# Patient Record
Sex: Female | Born: 1970 | Race: White | Hispanic: No | Marital: Married | Smoking: Former smoker
Health system: Southern US, Community
[De-identification: ages and names within clinical notes are randomized; demographics above are authoritative.]

## PROBLEM LIST (undated history)

## (undated) DIAGNOSIS — R519 Headache, unspecified: Secondary | ICD-10-CM

## (undated) DIAGNOSIS — M459 Ankylosing spondylitis of unspecified sites in spine: Secondary | ICD-10-CM

## (undated) DIAGNOSIS — F419 Anxiety disorder, unspecified: Secondary | ICD-10-CM

## (undated) DIAGNOSIS — M1990 Unspecified osteoarthritis, unspecified site: Secondary | ICD-10-CM

## (undated) DIAGNOSIS — M069 Rheumatoid arthritis, unspecified: Secondary | ICD-10-CM

## (undated) DIAGNOSIS — F909 Attention-deficit hyperactivity disorder, unspecified type: Secondary | ICD-10-CM

## (undated) DIAGNOSIS — F32A Depression, unspecified: Secondary | ICD-10-CM

## (undated) DIAGNOSIS — F329 Major depressive disorder, single episode, unspecified: Secondary | ICD-10-CM

## (undated) DIAGNOSIS — Z8489 Family history of other specified conditions: Secondary | ICD-10-CM

## (undated) DIAGNOSIS — L309 Dermatitis, unspecified: Secondary | ICD-10-CM

## (undated) DIAGNOSIS — R51 Headache: Secondary | ICD-10-CM

## (undated) HISTORY — DX: Headache, unspecified: R51.9

## (undated) HISTORY — DX: Depression, unspecified: F32.A

## (undated) HISTORY — DX: Headache: R51

## (undated) HISTORY — DX: Major depressive disorder, single episode, unspecified: F32.9

## (undated) HISTORY — PX: TONSILLECTOMY: SUR1361

---
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
## 1999-06-26 ENCOUNTER — Other Ambulatory Visit: Admission: RE | Admit: 1999-06-26 | Discharge: 1999-06-26 | Payer: Self-pay | Admitting: Family Medicine

## 2000-08-08 ENCOUNTER — Encounter: Admission: RE | Admit: 2000-08-08 | Discharge: 2000-08-08 | Payer: Self-pay | Admitting: Family Medicine

## 2000-08-08 ENCOUNTER — Encounter: Payer: Self-pay | Admitting: Family Medicine

## 2008-03-05 ENCOUNTER — Encounter: Admission: RE | Admit: 2008-03-05 | Discharge: 2008-03-05 | Payer: Self-pay | Admitting: Family Medicine

## 2012-12-01 ENCOUNTER — Ambulatory Visit (INDEPENDENT_AMBULATORY_CARE_PROVIDER_SITE_OTHER): Payer: 59 | Admitting: Medical

## 2012-12-01 ENCOUNTER — Encounter: Payer: Self-pay | Admitting: Medical

## 2012-12-01 ENCOUNTER — Other Ambulatory Visit (HOSPITAL_COMMUNITY): Payer: Self-pay | Admitting: Nurse Practitioner

## 2012-12-01 ENCOUNTER — Ambulatory Visit
Admission: RE | Admit: 2012-12-01 | Discharge: 2012-12-01 | Disposition: A | Discharge status: Home / Self Care | Payer: 59 | Source: Ambulatory Visit | Attending: Medical | Admitting: Medical

## 2012-12-01 VITALS — BP 110/80 | HR 80 | Temp 98.2°F | Resp 14 | Ht 66.0 in | Wt 200.0 lb

## 2012-12-01 DIAGNOSIS — F17200 Nicotine dependence, unspecified, uncomplicated: Secondary | ICD-10-CM

## 2012-12-01 DIAGNOSIS — Z1231 Encounter for screening mammogram for malignant neoplasm of breast: Secondary | ICD-10-CM

## 2012-12-01 DIAGNOSIS — L309 Dermatitis, unspecified: Secondary | ICD-10-CM

## 2012-12-01 DIAGNOSIS — L259 Unspecified contact dermatitis, unspecified cause: Secondary | ICD-10-CM

## 2012-12-01 DIAGNOSIS — J309 Allergic rhinitis, unspecified: Secondary | ICD-10-CM

## 2012-12-01 DIAGNOSIS — R05 Cough: Secondary | ICD-10-CM

## 2012-12-01 DIAGNOSIS — J329 Chronic sinusitis, unspecified: Secondary | ICD-10-CM

## 2012-12-01 MED ORDER — TRIAMCINOLONE ACETONIDE 0.1 % EX OINT: TOPICAL_OINTMENT | Freq: Two times a day (BID) | CUTANEOUS | Status: DC

## 2012-12-01 MED ORDER — AMOXICILLIN 875 MG PO TABS: 875.0000 mg | ORAL_TABLET | Freq: Two times a day (BID) | ORAL | Status: DC

## 2012-12-01 MED ORDER — FLUCONAZOLE 150 MG PO TABS: 150.0000 mg | ORAL_TABLET | Freq: Once | ORAL | Status: DC

## 2012-12-01 NOTE — Progress Notes (Addendum)
Subjective: New patient today.  I see her daughter as a patient.   Beginning this Wednesday started feeling like the "dead."   She notes headache x several days worse than usual, body hurts all over.  Feels pressure in sinuses and ears, some sore throat, some cough.  But coughs in general, is a smoker.  Denies fever, chills.  No nausea, no vomiting.  No diarrhea.   Getting some nasal drainage, green.  She thinks she has had a sinus infection in the past, but usually doesn't get sick.  Denies sick contacts.  Using some Tylenol Cold for symptoms.  Prior to this wednesday, felt fine.    She notes chronic cough for 4+ months.   Attributes to smoking.  Has been 20+ pack year smoker .   In general does have allergy problems, takes daily OTC [employer] generic antihistamine.  She notes skin peeling for years on bilat palms, sometimes fingers, bad on left sole.  Gets only on side of right foot.  Skin can be painful with the rash busts open.  Normally is just flaky and dry.  Has used all kinds of OTC creams, neosporin.   Nivia cream OTC has worked best.  No other prescription medication.    Objective: Filed Vitals:   12/01/12 0828  BP: 110/80  Pulse: 80  Temp: 98.2 F (36.8 C)  Resp: 14    General appearance: alert, no distress, WD/WN, white female HEENT: normocephalic, sclerae anicteric, tender frontal sinuses, serous effusion of TMs bilat, nares with swollen turbinates, mucoid discharge and erythema, pharynx normal Oral cavity: MMM, no lesions Neck: supple, no lymphadenopathy, no thyromegaly, no masses, no JVD Lungs: CTA bilaterally, no wheezes, rhonchi, or rales Pulses normal Ext - no edema   Assessment: Encounter Diagnoses  Name Primary?  . Sinusitis Yes  . Allergic rhinitis   . Chronic cough   . Eczema   . Tobacco use disorder      Plan: Sinusitis - begin amoxicillin, discussed supportive care Allergic rhinitis - change to OTC Allegra or Zyrtec Chronic cough - given smoking  history, chronic cough, will send for CXR Eczema - discussed importance of hydrating skin, using daily moisturizing lotion, short term use of Triamcinolone ointment for flare up.   Recheck if not improving Tobacco use - advised cessation.  She failed chantix prior.  Not interested in quitting at this time. Diflucan for post antibiotic possible yeast infection, she notes this often after antibiotics in the past.

## 2012-12-01 NOTE — Patient Instructions (Signed)
Sinus infection - begin Amoxicillin antibiotic twice daily for 10 days.  Consider OTC Mucinex DM for 5 days.  Increase your water intake. Rest.  For allergies in general, try changing to Allegra or Zyrtec OTC daily.  Eczema rash - short term use Triamcinolone ointment for up to 1-2 weeks.   Once the rash seems to be improving significantly, begin Nivia again daily, but use the plastic wrap on the foot to hold moisture in over night.    Call or return if not improving in 1-2 weeks.   Eczema Atopic dermatitis, or eczema, is an inherited type of sensitive skin. Often people with eczema have a family history of allergies, asthma, or hay fever. It causes a red itchy rash and dry scaly skin. The itchiness may occur before the skin rash and may be very intense. It is not contagious. Eczema is generally worse during the cooler winter months and often improves with the warmth of summer. Eczema usually starts showing signs in infancy. Some children outgrow eczema, but it may last through adulthood. Flare-ups may be caused by:  Eating something or contact with something you are sensitive or allergic to.  Stress. DIAGNOSIS  The diagnosis of eczema is usually based upon symptoms and medical history. TREATMENT  Eczema cannot be cured, but symptoms usually can be controlled with treatment or avoidance of allergens (things to which you are sensitive or allergic to).  Controlling the itching and scratching.  Use over-the-counter antihistamines as directed for itching. It is especially useful at night when the itching tends to be worse.  Use over-the-counter steroid creams as directed for itching.  Scratching makes the rash and itching worse and may cause impetigo (a skin infection) if fingernails are contaminated (dirty).  Keeping the skin well moisturized with creams every day. This will seal in moisture and help prevent dryness. Lotions containing alcohol and water can dry the skin and are not  recommended.  Limiting exposure to allergens.  Recognizing situations that cause stress.  Developing a plan to manage stress. HOME CARE INSTRUCTIONS   Take prescription and over-the-counter medicines as directed by your caregiver.  Do not use anything on the skin without checking with your caregiver.  Keep baths or showers short (5 minutes) in warm (not hot) water. Use mild cleansers for bathing. You may add non-perfumed bath oil to the bath water. It is best to avoid soap and bubble bath.  Immediately after a bath or shower, when the skin is still damp, apply a moisturizing ointment to the entire body. This ointment should be a petroleum ointment. This will seal in moisture and help prevent dryness. The thicker the ointment the better. These should be unscented.  Keep fingernails cut short and wash hands often. If your child has eczema, it may be necessary to put soft gloves or mittens on your child at night.  Dress in clothes made of cotton or cotton blends. Dress lightly, as heat increases itching.  Avoid foods that may cause flare-ups. Common foods include cow's milk, peanut butter, eggs and wheat.  Keep a child with eczema away from anyone with fever blisters. The virus that causes fever blisters (herpes simplex) can cause a serious skin infection in children with eczema. SEEK MEDICAL CARE IF:   Itching interferes with sleep.  The rash gets worse or is not better within one week following treatment.  The rash looks infected (pus or soft yellow scabs).  You or your child has an oral temperature above 102 F (38.9  C).  Your baby is older than 3 months with a rectal temperature of 100.5 F (38.1 C) or higher for more than 1 day.  The rash flares up after contact with someone who has fever blisters. SEEK IMMEDIATE MEDICAL CARE IF:   Your baby is older than 3 months with a rectal temperature of 102 F (38.9 C) or higher.  Your baby is older than 3 months or younger with a  rectal temperature of 100.4 F (38 C) or higher. Document Released: 04/16/2000 Document Revised: 07/12/2011 Document Reviewed: 02/19/2009 [facility] Patient Information 2014 [place], [place].   Cough, Adult  A cough is a reflex that helps clear your throat and airways. It can help heal the body or may be a reaction to an irritated airway. A cough may only last 2 or 3 weeks (acute) or may last more than 8 weeks (chronic).  CAUSES Acute cough:  Viral or bacterial infections. Chronic cough:  Infections.  Allergies.  Asthma.  Post-nasal drip.  Smoking.  Heartburn or acid reflux.  Some medicines.  Chronic lung problems (COPD).  Cancer. SYMPTOMS   Cough.  Fever.  Chest pain.  Increased breathing rate.  High-pitched whistling sound when breathing (wheezing).  Colored mucus that you cough up (sputum). TREATMENT   A bacterial cough may be treated with antibiotic medicine.  A viral cough must run its course and will not respond to antibiotics.  Your caregiver may recommend other treatments if you have a chronic cough. HOME CARE INSTRUCTIONS   Only take over-the-counter or prescription medicines for pain, discomfort, or fever as directed by your caregiver. Use cough suppressants only as directed by your caregiver.  Use a cold steam vaporizer or humidifier in your bedroom or home to help loosen secretions.  Sleep in a semi-upright position if your cough is worse at night.  Rest as needed.  Stop smoking if you smoke. SEEK IMMEDIATE MEDICAL CARE IF:   You have pus in your sputum.  Your cough starts to worsen.  You cannot control your cough with suppressants and are losing sleep.  You begin coughing up blood.  You have difficulty breathing.  You develop pain which is getting worse or is uncontrolled with medicine.  You have a fever. MAKE SURE YOU:   Understand these instructions.  Will watch your condition.  Will get help right away if you are not  doing well or get worse. Document Released: 10/16/2010 Document Revised: 07/12/2011 Document Reviewed: 10/16/2010 [facility] Patient Information 2014 [place], [place].

## 2012-12-05 ENCOUNTER — Ambulatory Visit (HOSPITAL_COMMUNITY)
Admission: RE | Admit: 2012-12-05 | Discharge: 2012-12-05 | Disposition: A | Discharge status: Home / Self Care | Payer: 59 | Source: Ambulatory Visit | Attending: Nurse Practitioner | Admitting: Nurse Practitioner

## 2012-12-05 DIAGNOSIS — Z1231 Encounter for screening mammogram for malignant neoplasm of breast: Secondary | ICD-10-CM

## 2012-12-06 ENCOUNTER — Other Ambulatory Visit: Payer: Self-pay | Admitting: Nurse Practitioner

## 2012-12-06 DIAGNOSIS — R928 Other abnormal and inconclusive findings on diagnostic imaging of breast: Secondary | ICD-10-CM

## 2012-12-21 ENCOUNTER — Ambulatory Visit
Admission: RE | Admit: 2012-12-21 | Discharge: 2012-12-21 | Disposition: A | Discharge status: Home / Self Care | Payer: 59 | Source: Ambulatory Visit | Attending: Nurse Practitioner | Admitting: Nurse Practitioner

## 2012-12-21 DIAGNOSIS — R928 Other abnormal and inconclusive findings on diagnostic imaging of breast: Secondary | ICD-10-CM

## 2017-09-17 LAB — HM PAP SMEAR: HM Pap smear: NORMAL

## 2018-07-14 ENCOUNTER — Other Ambulatory Visit: Payer: Self-pay

## 2018-07-14 ENCOUNTER — Encounter: Payer: Self-pay | Admitting: Family Medicine

## 2018-07-14 ENCOUNTER — Ambulatory Visit (INDEPENDENT_AMBULATORY_CARE_PROVIDER_SITE_OTHER): Payer: BLUE CROSS/BLUE SHIELD | Admitting: Family Medicine

## 2018-07-14 VITALS — BP 126/80 | HR 71 | Temp 98.1°F | Resp 14 | Ht 65.5 in | Wt 268.5 lb

## 2018-07-14 DIAGNOSIS — Z8261 Family history of arthritis: Secondary | ICD-10-CM | POA: Diagnosis not present

## 2018-07-14 DIAGNOSIS — F419 Anxiety disorder, unspecified: Secondary | ICD-10-CM | POA: Insufficient documentation

## 2018-07-14 DIAGNOSIS — Z131 Encounter for screening for diabetes mellitus: Secondary | ICD-10-CM

## 2018-07-14 DIAGNOSIS — R234 Changes in skin texture: Secondary | ICD-10-CM | POA: Insufficient documentation

## 2018-07-14 DIAGNOSIS — F32A Depression, unspecified: Secondary | ICD-10-CM | POA: Insufficient documentation

## 2018-07-14 DIAGNOSIS — M2550 Pain in unspecified joint: Secondary | ICD-10-CM | POA: Insufficient documentation

## 2018-07-14 DIAGNOSIS — Z975 Presence of (intrauterine) contraceptive device: Secondary | ICD-10-CM | POA: Diagnosis not present

## 2018-07-14 DIAGNOSIS — K625 Hemorrhage of anus and rectum: Secondary | ICD-10-CM | POA: Insufficient documentation

## 2018-07-14 DIAGNOSIS — Z1231 Encounter for screening mammogram for malignant neoplasm of breast: Secondary | ICD-10-CM

## 2018-07-14 DIAGNOSIS — Z13220 Encounter for screening for lipoid disorders: Secondary | ICD-10-CM

## 2018-07-14 DIAGNOSIS — Z114 Encounter for screening for human immunodeficiency virus [HIV]: Secondary | ICD-10-CM

## 2018-07-14 DIAGNOSIS — F341 Dysthymic disorder: Secondary | ICD-10-CM

## 2018-07-14 DIAGNOSIS — Z1159 Encounter for screening for other viral diseases: Secondary | ICD-10-CM

## 2018-07-14 DIAGNOSIS — Z6841 Body Mass Index (BMI) 40.0 and over, adult: Secondary | ICD-10-CM | POA: Insufficient documentation

## 2018-07-14 MED ORDER — VENLAFAXINE HCL ER 37.5 MG PO CP24: ORAL_CAPSULE | ORAL | 0 refills | Status: DC

## 2018-07-14 NOTE — Progress Notes (Signed)
New Patient Office Visit  Subjective:  Patient ID: Anna Keller, female    DOB: 1970-05-15  Age: 48 y.o. MRN: [id number]  CC:  Chief Complaint  Patient presents with  . New Patient (Initial Visit)    HPI Anna Keller presents to establish care:  IUD In Place: Has had for 6 years, thinks it is a Corporate treasurer.  Is overdue to have it removed, we will refer to GYN for removal. She is still having some spotting (dark brown discharge) about once every 6 months. Has history of dysmenorrhea. No abdominal pain.  Concern for Arthritis: Has extensive family history (Father, PGM, Mother) of degenerative arthritis.  She notes that she has joint pain in almost all of her joints - knees, ankles, hands, elbows, hips, shoulders, neck, and her back.  She does note some skin changes for several years - skin peels from her feet, around her finger nails, and on her face, however no history of psoriasis.  She endorses joint stiffness bilaterally, no particular pattern to her pain or stiffness. She had LEFT knee arthroscopy at age 32, has not had any surgeries since then.  Stress incontinence: When she sneezes she has some urine leakage. Discussed Kegels. Declines additional evaluation at this time. No dysuria or hematuria.  Anxiety and Depression: She used to take Xanax for several years, and this worked well for her. 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 She used to get hot flashes but these have resolved, still having night sweats, she also has mood swings irritability.   Family history of breast cancer: MGM and Maternal Aunt.  Had mammogram 6 years ago. We will order today.  Obesity: She has been doing Keto, but not strictly.  She does not exercise because she is affected by chronic joint pain. Body mass index is 44 kg/m.   Intermittent Rectal Bleeding/?IBS-C: Denies family or personal history of colorectal cancer, no changes in BM's -  no blood in stool, dark and tarry stool, mucus in stool, or constipation/diarrhea.  She occasionally has blood on the toilet tissue when she wipes, she does remain constipated.     Office Visit from 07/14/2018 in [facility]  PHQ-9 Total Score  6    PHQ-9 is positive for dysthymia, but not MDD.  She has more anxiety component.   GAD 7 : Generalized Anxiety Score 07/14/2018  Nervous, Anxious, on Edge 2  Control/stop worrying 3  Worry too much - different things 3  Trouble relaxing 0  Restless 0  Easily annoyed or irritable 3  Afraid - awful might happen 0  Total GAD 7 Score 11  Anxiety Difficulty Not difficult at all   Past Medical History:  Diagnosis Date  . Depression   . Headache    No past surgical history on file.  Family History  Problem Relation Age of Onset  . Hypertension Mother   . Arthritis Father   . Heart disease Sister   . Pulmonary Hypertension Sister   . COPD Sister   . Diabetes Sister   . Breast cancer Maternal Grandfather   . Heart attack Paternal Grandmother   . Breast cancer Maternal Aunt     Social History   Socioeconomic History  . Marital status: Married    Spouse name: Anna Keller  . Number of children: 1  . Years of education: 62  . Highest education level: High school graduate  Occupational History  .  Not on file  Social Needs  . Financial resource strain: Not hard at all  . Food insecurity:    Worry: Never true    Inability: Never true  . Transportation needs:    Medical: No    Non-medical: No  Tobacco Use  . Smoking status: Former Smoker    Packs/day: 1.00    Years: 25.00    Pack years: 25.00    Types: Cigarettes    Last attempt to quit: 01/09/2016    Years since quitting: 2.5  . Smokeless tobacco: Never Used  Substance and Sexual Activity  . Alcohol use: Not Currently  . Drug use: No  . Sexual activity: Yes    Partners: Male    Birth control/protection: None  Lifestyle  . Physical activity:    Days per  week: 0 days    Minutes per session: 0 min  . Stress: Only a little  Relationships  . Social connections:    Talks on phone: More than three times a week    Gets together: More than three times a week    Attends religious service: Never    Active member of club or organization: No    Attends meetings of clubs or organizations: Never    Relationship status: Married  . Intimate partner violence:    Fear of current or ex partner: No    Emotionally abused: No    Physically abused: No    Forced sexual activity: No  Other Topics Concern  . Not on file  Social History Narrative  . Not on file    ROS Review of Systems  Constitutional: Negative for fever or weight change.  Respiratory: Negative for cough and shortness of breath.   Cardiovascular: Negative for chest pain or palpitations.  Gastrointestinal: Negative for abdominal pain, no bowel changes.  Musculoskeletal: Negative for gait problem or joint swelling. See HPI Skin: Negative for rash.  Neurological: Negative for dizziness or headache.  No other specific complaints in a complete review of systems (except as listed in HPI above).  Objective:   Today's Vitals: BP 126/80   Pulse 71   Temp 98.1 F (36.7 C)   Resp 14   Ht 5' 5.5" (1.664 m)   Wt 268 lb 8 oz (121.8 kg)   SpO2 98%   BMI 44.00 kg/m   Physical Exam  Constitutional: Patient appears well-developed and well-nourished. No distress.  HENT: Head: Normocephalic and atraumatic. Eyes: Conjunctivae and EOM are normal. No scleral icterus.  Neck: Normal range of motion. Neck supple. No JVD present. No thyromegaly present.  Cardiovascular: Normal rate, regular rhythm and normal heart sounds.  No murmur heard. No BLE edema. Pulmonary/Chest: Effort normal and breath sounds normal. No respiratory distress. Musculoskeletal: Normal range of motion, no joint effusions. No gross deformities. Crepitus to bilateral knees. Normal popliteal fossa.  Hands/fingers appear normal  with normal AROM and no swelling. Neurological: Pt is alert and oriented to person, place, and time. No cranial nerve deficit. Coordination, balance, strength, speech and gait are normal.  Skin: Skin is warm and dry. There is peeling dry skin around the nails on bilateral hands, the bilateral heels exhibit rough patches with peeling skin, there is a small area on the right cheek with a small patch of dry flaking skin. No erythema. Psychiatric: Patient has a normal mood and affect. behavior is normal. Judgment and thought content normal.  Assessment & Plan:   Problem List Items Addressed This Visit  Digestive   Bright red rectal bleeding   Relevant Orders   Ambulatory referral to Gastroenterology     Other   IUD (intrauterine device) in place   Relevant Orders   Ambulatory referral to Gynecology   Arthralgia - Primary   Relevant Orders   ANA   C-reactive protein (Completed)   Sedimentation rate (Completed)   CBC with Differential/Platelet (Completed)   Family history of arthritis   Peeling skin   Relevant Orders   Ambulatory referral to Dermatology   Class 3 severe obesity due to excess calories with body mass index (BMI) of 40.0 to 44.9 in adult (HCC)   Relevant Orders   TSH (Completed)   Anxiety   Relevant Medications   venlafaxine XR (EFFEXOR XR) 37.5 MG 24 hr capsule   Dysthymia   Relevant Medications   venlafaxine XR (EFFEXOR XR) 37.5 MG 24 hr capsule    Other Visit Diagnoses    Lipid screening       Relevant Orders   Lipid panel (Completed)   Diabetes mellitus screening       Relevant Orders   Comprehensive metabolic panel (Completed)   Hemoglobin A1c (Completed)   Need for hepatitis C screening test       Relevant Orders   Hepatitis C antibody   Encounter for screening for HIV       Relevant Orders   HIV Antibody (routine testing w rflx) (Completed)   Breast cancer screening by mammogram       Relevant Orders   MM 3D SCREEN BREAST BILATERAL       Outpatient Encounter Medications as of 07/14/2018  Medication Sig  . Cetirizine HCl (ZYRTEC PO) Take 1 tablet by mouth daily.  . Cholecalciferol (VITAMIN D3 PO) Take 1 capsule by mouth daily.  . Multiple Vitamins-Minerals (MULTIVITAMIN WITH MINERALS) tablet Take 1 tablet by mouth daily.  Marland Kitchen OMEPRAZOLE PO Take 1 tablet by mouth daily.  . Cyanocobalamin (B-12) 1000 MCG CAPS Take 1 capsule by mouth daily.  Marland Kitchen docusate sodium (COLACE) 100 MG capsule Take 100 mg by mouth 2 (two) times daily.  . fish oil-omega-3 fatty acids 1000 MG capsule Take 1 g by mouth daily.  . naproxen sodium (ANAPROX) 220 MG tablet Take 440 mg by mouth 2 (two) times daily with a meal.  . NON FORMULARY Take 3 tablets by mouth daily.  . NON FORMULARY Take 2 capsules by mouth daily.  Marland Kitchen venlafaxine XR (EFFEXOR XR) 37.5 MG 24 hr capsule Take 1 tablet daily for 7 days, the increase to 2 tablets daily.  . [DISCONTINUED] amoxicillin (AMOXIL) 875 MG tablet Take 1 tablet (875 mg total) by mouth 2 (two) times daily. (Patient not taking: Reported on 07/14/2018)  . [DISCONTINUED] fluconazole (DIFLUCAN) 150 MG tablet Take 1 tablet (150 mg total) by mouth once. (Patient not taking: Reported on 07/14/2018)  . [DISCONTINUED] triamcinolone ointment (KENALOG) 0.1 % Apply topically 2 (two) times daily. (Patient not taking: Reported on 07/14/2018)   No facility-administered encounter medications on file as of 07/14/2018.     Follow-up: Return for 6 week follow up + CPE w/ Pap - 105min appt.   Hubbard Hartshorn, FNP

## 2018-07-14 NOTE — Patient Instructions (Signed)
[facility] - Walk in clinic 1pm-7pm Mon-Fri

## 2018-07-17 LAB — CBC WITH DIFFERENTIAL/PLATELET
Absolute Monocytes: 436 {cells}/uL (ref 200–950)
Basophils Absolute: 59 {cells}/uL (ref 0–200)
Basophils Relative: 1.2 %
Eosinophils Absolute: 191 {cells}/uL (ref 15–500)
Eosinophils Relative: 3.9 %
HCT: 38 % (ref 35.0–45.0)
Hemoglobin: 13.2 g/dL (ref 11.7–15.5)
Lymphs Abs: 1764 {cells}/uL (ref 850–3900)
MCH: 32.6 pg (ref 27.0–33.0)
MCHC: 34.7 g/dL (ref 32.0–36.0)
MCV: 93.8 fL (ref 80.0–100.0)
MPV: 9.8 fL (ref 7.5–12.5)
Monocytes Relative: 8.9 %
Neutro Abs: 2450 {cells}/uL (ref 1500–7800)
Neutrophils Relative %: 50 %
Platelets: 273 Thousand/uL (ref 140–400)
RBC: 4.05 Million/uL (ref 3.80–5.10)
RDW: 12.4 % (ref 11.0–15.0)
Total Lymphocyte: 36 %
WBC: 4.9 Thousand/uL (ref 3.8–10.8)

## 2018-07-17 LAB — COMPREHENSIVE METABOLIC PANEL WITH GFR
AG Ratio: 2 (calc) (ref 1.0–2.5)
ALT: 15 U/L (ref 6–29)
AST: 13 U/L (ref 10–35)
Albumin: 4.3 g/dL (ref 3.6–5.1)
Alkaline phosphatase (APISO): 73 U/L (ref 31–125)
BUN: 17 mg/dL (ref 7–25)
CO2: 24 mmol/L (ref 20–32)
Calcium: 9.2 mg/dL (ref 8.6–10.2)
Chloride: 106 mmol/L (ref 98–110)
Creat: 0.64 mg/dL (ref 0.50–1.10)
Globulin: 2.2 g/dL (ref 1.9–3.7)
Glucose, Bld: 106 mg/dL — ABNORMAL HIGH (ref 65–99)
Potassium: 4 mmol/L (ref 3.5–5.3)
Sodium: 138 mmol/L (ref 135–146)
Total Bilirubin: 0.4 mg/dL (ref 0.2–1.2)
Total Protein: 6.5 g/dL (ref 6.1–8.1)

## 2018-07-17 LAB — HEMOGLOBIN A1C
Hgb A1c MFr Bld: 5 % of total Hgb (ref <5.7)
Mean Plasma Glucose: 97 (calc)
eAG (mmol/L): 5.4 (calc)

## 2018-07-17 LAB — LIPID PANEL
CHOL/HDL RATIO: 3.6 (calc) (ref <5.0)
Cholesterol: 199 mg/dL (ref <200)
HDL: 55 mg/dL (ref >=50)
LDL CHOLESTEROL (CALC): 119 mg/dL — AB
Non-HDL Cholesterol (Calc): 144 mg/dL (calc) — ABNORMAL HIGH (ref <130)
Triglycerides: 141 mg/dL (ref <150)

## 2018-07-17 LAB — ANA: Anti Nuclear Antibody(ANA): NEGATIVE

## 2018-07-17 LAB — HEPATITIS C ANTIBODY
Hepatitis C Ab: NONREACTIVE
SIGNAL TO CUT-OFF: 0 (ref <1.00)

## 2018-07-17 LAB — C-REACTIVE PROTEIN: CRP: 4.8 mg/L

## 2018-07-17 LAB — SEDIMENTATION RATE: Sed Rate: 2 mm/h (ref 0–20)

## 2018-07-17 LAB — TSH: TSH: 1.59 mIU/L

## 2018-07-17 LAB — HIV ANTIBODY (ROUTINE TESTING W REFLEX): HIV 1&2 Ab, 4th Generation: NONREACTIVE

## 2018-07-19 ENCOUNTER — Telehealth: Payer: Self-pay | Admitting: Obstetrics & Gynecology

## 2018-07-19 NOTE — Telephone Encounter (Signed)
[facility] referring for IUD Removal. Called and left voicemail for patient to call back to be schedule

## 2018-07-28 NOTE — Telephone Encounter (Signed)
Called and left voice mail for patient to call back to be schedule °

## 2018-08-29 ENCOUNTER — Other Ambulatory Visit: Payer: Self-pay

## 2018-08-29 ENCOUNTER — Ambulatory Visit (INDEPENDENT_AMBULATORY_CARE_PROVIDER_SITE_OTHER): Payer: BLUE CROSS/BLUE SHIELD | Admitting: Family Medicine

## 2018-08-29 ENCOUNTER — Encounter: Payer: Self-pay | Admitting: Family Medicine

## 2018-08-29 DIAGNOSIS — K625 Hemorrhage of anus and rectum: Secondary | ICD-10-CM | POA: Diagnosis not present

## 2018-08-29 DIAGNOSIS — M2550 Pain in unspecified joint: Secondary | ICD-10-CM | POA: Diagnosis not present

## 2018-08-29 DIAGNOSIS — F419 Anxiety disorder, unspecified: Secondary | ICD-10-CM

## 2018-08-29 DIAGNOSIS — Z6841 Body Mass Index (BMI) 40.0 and over, adult: Secondary | ICD-10-CM

## 2018-08-29 MED ORDER — BUSPIRONE HCL 7.5 MG PO TABS: ORAL_TABLET | ORAL | 1 refills | Status: DC

## 2018-08-29 MED ORDER — ESCITALOPRAM OXALATE 10 MG PO TABS: 10.0000 mg | ORAL_TABLET | Freq: Every day | ORAL | 1 refills | Status: DC

## 2018-08-29 NOTE — Patient Instructions (Addendum)
Take Venlafaxine (Effexor) 2 tablets once daily AND Take 1/2 tablet of Lexapro once daily for 7 days. THEN Take 1 tablet of Effexor once daily AND take 1 tablet of Lexapro once daily for 7 days. THEN STOP Effexor, continue taking Lexapro once daily.  Call [facility] - [phone] to find out if they can see you over the phone.

## 2018-08-29 NOTE — Progress Notes (Signed)
Name: Anna Keller   MRN: [id number]    DOB: 1970/05/18   Date:08/29/2018       Progress Note  Subjective  Chief Complaint  Chief Complaint  Patient presents with  . Follow-up    6 week recheck    I connected with  Anna Keller  on 08/29/18 at  8:20 AM EDT by a video enabled telemedicine application and verified that I am speaking with the correct person using two identifiers.  I discussed the limitations of evaluation and management by telemedicine and the availability of in person appointments. The patient expressed understanding and agreed to proceed. Staff also discussed with the patient that there may be a patient responsible charge related to this service. Patient Location: home Provider Location: home Additional Individuals present: none  HPI  Arthralgia: Has extensive family history (Father, PGM, Mother) of degenerative arthritis.  She notes that she has joint pain in almost all of her joints - knees, ankles, hands, elbows, hips, shoulders, neck, and her back.  She does note some skin changes for several years - skin peels from her feet, around her finger nails, and on her face, however no history of psoriasis.  She endorses joint stiffness bilaterally, no particular pattern to her pain or stiffness. She had LEFT knee arthroscopy at age 61, has not had any surgeries since then.  Lab testing at initial visit was reassuring for no inflammatory arthritis - Sed rate, CRP, TSH, CBC were all normal.  We will refer to Ortho. She is taking tylenol OTC BID which only helps somewhat. After cleared by GI for rectal bleeding, will consider NSAID therapy.   Anxiety: She used to take Xanax for several years, and this worked well for her. 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 She used to get hot flashes but these have resolved, still having night sweats, she also has mood swings irritability. She was initiated on Effexor  and is at 75mg  and this is only helping a little bit. We will change medication regimen today per orders.     Office Visit from 08/29/2018 in [facility]  PHQ-9 Total Score  1     GAD 7 : Generalized Anxiety Score 08/29/2018 07/14/2018  Nervous, Anxious, on Edge 3 2  Control/stop worrying 3 3  Worry too much - different things 3 3  Trouble relaxing 0 0  Restless 0 0  Easily annoyed or irritable 3 3  Afraid - awful might happen 0 0  Total GAD 7 Score 12 11  Anxiety Difficulty Not difficult at all Not difficult at all   Obesity: She has been doing Keto, but not strictly.  She does not exercise because she is affected by chronic joint pain. Recommend gentle exercise including aquatics once able to attend.  Intermittent Rectal Bleeding/?IBS-C: Denies family or personal history of colorectal cancer, no changes in BM's - no blood in stool, dark and tarry stool, mucus in stool, or constipation/diarrhea.  She occasionally has blood on the toilet tissue when she wipes, she does remain constipated.  She has not been to see GI yet.   Patient Active Problem List   Diagnosis Date Noted  . IUD (intrauterine device) in place 07/14/2018  . Arthralgia 07/14/2018  . Family history of arthritis 07/14/2018  . Peeling skin 07/14/2018  . Class 3 severe obesity due to excess calories with body mass index (BMI) of 40.0 to 44.9 in  adult ([place]) 07/14/2018  . Bright red rectal bleeding 07/14/2018  . Anxiety 07/14/2018  . Dysthymia 07/14/2018    No past surgical history on file.  Family History  Problem Relation Age of Onset  . Hypertension Mother   . Arthritis Father   . Heart disease Sister   . Pulmonary Hypertension Sister   . COPD Sister   . Diabetes Sister   . Breast cancer Maternal Grandfather   . Heart attack Paternal Grandmother   . Breast cancer Maternal Aunt     Social History   Socioeconomic History  . Marital status: Married    Spouse name: Anna Keller  . Number of  children: 1  . Years of education: 72  . Highest education level: High school graduate  Occupational History  . Not on file  Social Needs  . Financial resource strain: Not hard at all  . Food insecurity:    Worry: Never true    Inability: Never true  . Transportation needs:    Medical: No    Non-medical: No  Tobacco Use  . Smoking status: Former Smoker    Packs/day: 1.00    Years: 25.00    Pack years: 25.00    Types: Cigarettes    Last attempt to quit: 01/09/2016    Years since quitting: 2.6  . Smokeless tobacco: Never Used  Substance and Sexual Activity  . Alcohol use: Not Currently  . Drug use: No  . Sexual activity: Yes    Partners: Male    Birth control/protection: None  Lifestyle  . Physical activity:    Days per week: 0 days    Minutes per session: 0 min  . Stress: Only a little  Relationships  . Social connections:    Talks on phone: More than three times a week    Gets together: More than three times a week    Attends religious service: Never    Active member of club or organization: No    Attends meetings of clubs or organizations: Never    Relationship status: Married  . Intimate partner violence:    Fear of current or ex partner: No    Emotionally abused: No    Physically abused: No    Forced sexual activity: No  Other Topics Concern  . Not on file  Social History Narrative  . Not on file     Current Outpatient Medications:  .  Cetirizine HCl (ZYRTEC PO), Take 1 tablet by mouth daily., Disp: , Rfl:  .  Cholecalciferol (VITAMIN D3 PO), Take 1 capsule by mouth daily., Disp: , Rfl:  .  Cyanocobalamin (B-12) 1000 MCG CAPS, Take 1 capsule by mouth daily., Disp: , Rfl:  .  docusate sodium (COLACE) 100 MG capsule, Take 100 mg by mouth 2 (two) times daily., Disp: , Rfl:  .  fish oil-omega-3 fatty acids 1000 MG capsule, Take 1 g by mouth daily., Disp: , Rfl:  .  Multiple Vitamins-Minerals (MULTIVITAMIN WITH MINERALS) tablet, Take 1 tablet by mouth daily.,  Disp: , Rfl:  .  naproxen sodium (ANAPROX) 220 MG tablet, Take 440 mg by mouth 2 (two) times daily with a meal., Disp: , Rfl:  .  NON FORMULARY, Take 3 tablets by mouth daily., Disp: , Rfl:  .  NON FORMULARY, Take 2 capsules by mouth daily., Disp: , Rfl:  .  OMEPRAZOLE PO, Take 1 tablet by mouth daily., Disp: , Rfl:   No Known Allergies  I personally reviewed active problem list, medication list,  allergies, health maintenance, notes from last encounter, lab results with the patient/caregiver today.   ROS Constitutional: Negative for fever or weight change.  Respiratory: Negative for cough and shortness of breath.   Cardiovascular: Negative for chest pain or palpitations.  Gastrointestinal: Negative for abdominal pain, no bowel changes.  Musculoskeletal: Negative for gait problem or joint swelling. +Arthralgias Skin: Negative for rash.  Neurological: Negative for dizziness or headache.  No other specific complaints in a complete review of systems (except as listed in HPI above).  Objective  Virtual encounter, vitals not obtained.  There is no height or weight on file to calculate BMI.  Physical Exam  Constitutional: Patient appears well-developed and well-nourished. No distress.  HENT: Head: Normocephalic and atraumatic.  Neck: Normal range of motion. Pulmonary/Chest: Effort normal. No respiratory distress. Speaking in complete sentences Neurological: Pt is alert and oriented to person, place, and time. Coordination, speech are normal.  Psychiatric: Patient has a normal mood and affect. behavior is normal. Judgment and thought content normal.  No results found for this or any previous visit (from the past 72 hour(s)).  PHQ2/9: Depression screen [facility] 2/9 08/29/2018 07/14/2018  Decreased Interest 0 0  Down, Depressed, Hopeless 0 0  PHQ - 2 Score 0 0  Altered sleeping 0 3  Tired, decreased energy 1 3  Change in appetite 0 0  Feeling bad or failure about yourself  0 0  Trouble  concentrating 0 0  Moving slowly or fidgety/restless 0 0  Suicidal thoughts 0 0  PHQ-9 Score 1 6  Difficult doing work/chores Not difficult at all Not difficult at all   PHQ-2/9 Result is negative.    Fall Risk: Fall Risk  08/29/2018 07/14/2018  Falls in the past year? 0 0  Number falls in past yr: 0 -  Injury with Fall? 0 -  Follow up Falls evaluation completed -    Assessment & Plan  1. Anxiety - See AVS for instructions on medicaiton tapering.  She Effexor is ineffective. - escitalopram (LEXAPRO) 10 MG tablet; Take 1 tablet (10 mg total) by mouth daily.  Dispense: 30 tablet; Refill: 1 - busPIRone (BUSPAR) 7.5 MG tablet; Take 1 tablet once daily at night; may take 1 additional tablet daily as needed.  Dispense: 45 tablet; Refill: 1  2. Arthralgia, unspecified joint - Advised needs to have evaluation for intermittent rectal bleeding prior to determining if NSAID therapy may be utilized.  We may consider pain management referral at next visit as well.  3. Class 3 severe obesity due to excess calories with body mass index (BMI) of 40.0 to 44.9 in adult, unspecified whether serious comorbidity present [facility]) - Discussed importance of 150 minutes of physical activity weekly (recommend she do as tolerated), eat two servings of fish weekly, eat one serving of tree nuts ( cashews, pistachios, pecans, almonds.Marland Kitchen) every other day, eat 6 servings of fruit/vegetables daily and drink plenty of water and avoid sweet beverages.   4. Bright red rectal bleeding - Given number for [facility] - advised likely doing telehealth and to not postpone her appointment.  I discussed the assessment and treatment plan with the patient. The patient was provided an opportunity to ask questions and all were answered. The patient agreed with the plan and demonstrated an understanding of the instructions.  The patient was advised to call back or seek an in-person evaluation if the symptoms worsen or if the condition fails  to improve as anticipated.  I provided 27 minutes of non-face-to-face  time during this encounter.

## 2018-09-07 ENCOUNTER — Encounter: Payer: Self-pay | Admitting: Family Medicine

## 2018-09-11 ENCOUNTER — Encounter: Payer: Self-pay | Admitting: Family Medicine

## 2018-10-02 ENCOUNTER — Other Ambulatory Visit: Payer: Self-pay

## 2018-10-02 ENCOUNTER — Encounter: Payer: Self-pay | Admitting: Family Medicine

## 2018-10-02 ENCOUNTER — Ambulatory Visit: Payer: BLUE CROSS/BLUE SHIELD | Admitting: Family Medicine

## 2018-10-02 ENCOUNTER — Ambulatory Visit
Admission: RE | Admit: 2018-10-02 | Discharge: 2018-10-02 | Disposition: A | Discharge status: Home / Self Care | Payer: BLUE CROSS/BLUE SHIELD | Source: Ambulatory Visit | Attending: Family Medicine | Admitting: Family Medicine

## 2018-10-02 VITALS — BP 132/82 | HR 72 | Temp 98.2°F | Resp 14 | Ht 65.5 in | Wt 273.4 lb

## 2018-10-02 DIAGNOSIS — R0602 Shortness of breath: Secondary | ICD-10-CM | POA: Diagnosis present

## 2018-10-02 DIAGNOSIS — F419 Anxiety disorder, unspecified: Secondary | ICD-10-CM | POA: Diagnosis not present

## 2018-10-02 DIAGNOSIS — E66813 Obesity, class 3: Secondary | ICD-10-CM

## 2018-10-02 DIAGNOSIS — R059 Cough, unspecified: Secondary | ICD-10-CM

## 2018-10-02 DIAGNOSIS — F17200 Nicotine dependence, unspecified, uncomplicated: Secondary | ICD-10-CM

## 2018-10-02 DIAGNOSIS — Z6841 Body Mass Index (BMI) 40.0 and over, adult: Secondary | ICD-10-CM

## 2018-10-02 DIAGNOSIS — R05 Cough: Secondary | ICD-10-CM

## 2018-10-02 MED ORDER — HYDROXYZINE HCL 10 MG PO TABS: 10.0000 mg | ORAL_TABLET | Freq: Three times a day (TID) | ORAL | 0 refills | Status: DC | PRN

## 2018-10-02 MED ORDER — ALBUTEROL SULFATE HFA 108 (90 BASE) MCG/ACT IN AERS: 2.0000 | INHALATION_SPRAY | Freq: Four times a day (QID) | RESPIRATORY_TRACT | 0 refills | Status: DC | PRN

## 2018-10-02 MED ORDER — ESCITALOPRAM OXALATE 20 MG PO TABS: 20.0000 mg | ORAL_TABLET | Freq: Every day | ORAL | 1 refills | Status: DC

## 2018-10-02 NOTE — Progress Notes (Addendum)
Name: Anna Keller   MRN: [id number]    DOB: 1970/07/16   Date:10/02/2018       Progress Note  Subjective  Chief Complaint  No chief complaint on file.   HPI  Anxiety: She used to take Xanax for several years, and this workedwell for her. 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 She used to get hot flashesbut these have resolved, still having occasional night sweats, she also has mood swings irritability, and worries constantly.Marland Kitchen She was initiated on Effexor and is at 75mg  and this was only helping a little bit, so we switched her to Lexapro and Buspar.  Buspar caused her to not feel well - very tired, so we stopped this medication.  States Lexapro along is not doing much for her.  She has tried Wellbutrin for smoking several years ago and it did not help.  Obesity: She is not able to exercise due to pain.  She eats whatever she wants because she is constantly hungry. She had IUD removed by GYN (Dr. Andrez Grime Penn with [facility]) - had this removed to see if mood/obesity were related to menopausal changes.  Shortness of breath and Cough: Has had ongoing for a couple of years.  She has 30 pack year history, does not qualify for CT lung cancer screening yet.  Denies fevers/chills.   Patient Active Problem List   Diagnosis Date Noted  . IUD (intrauterine device) in place 07/14/2018  . Arthralgia 07/14/2018  . Family history of arthritis 07/14/2018  . Peeling skin 07/14/2018  . Class 3 severe obesity due to excess calories with body mass index (BMI) of 40.0 to 44.9 in adult ([place]) 07/14/2018  . Bright red rectal bleeding 07/14/2018  . Anxiety 07/14/2018  . Dysthymia 07/14/2018    No past surgical history on file.  Family History  Problem Relation Age of Onset  . Hypertension Mother   . Arthritis Father   . Heart disease Sister   . Pulmonary Hypertension Sister   . COPD Sister   . Diabetes Sister    . Breast cancer Maternal Grandfather   . Heart attack Paternal Grandmother   . Breast cancer Maternal Aunt     Social History   Socioeconomic History  . Marital status: Married    Spouse name: Legrand Como  . Number of children: 1  . Years of education: 8  . Highest education level: High school graduate  Occupational History  . Not on file  Social Needs  . Financial resource strain: Not hard at all  . Food insecurity:    Worry: Never true    Inability: Never true  . Transportation needs:    Medical: No    Non-medical: No  Tobacco Use  . Smoking status: Former Smoker    Packs/day: 1.00    Years: 25.00    Pack years: 25.00    Types: Cigarettes    Last attempt to quit: 01/09/2016    Years since quitting: 2.7  . Smokeless tobacco: Never Used  Substance and Sexual Activity  . Alcohol use: Not Currently  . Drug use: No  . Sexual activity: Yes    Partners: Male    Birth control/protection: None  Lifestyle  . Physical activity:    Days per week: 0 days    Minutes per session: 0 min  . Stress: Only a little  Relationships  . Social connections:    Talks on phone:  More than three times a week    Gets together: More than three times a week    Attends religious service: Never    Active member of club or organization: No    Attends meetings of clubs or organizations: Never    Relationship status: Married  . Intimate partner violence:    Fear of current or ex partner: No    Emotionally abused: No    Physically abused: No    Forced sexual activity: No  Other Topics Concern  . Not on file  Social History Narrative  . Not on file     Current Outpatient Medications:  .  busPIRone (BUSPAR) 7.5 MG tablet, Take 1 tablet once daily at night; may take 1 additional tablet daily as needed., Disp: 45 tablet, Rfl: 1 .  Cetirizine HCl (ZYRTEC PO), Take 1 tablet by mouth daily., Disp: , Rfl:  .  Cholecalciferol (VITAMIN D3 PO), Take 1 capsule by mouth daily., Disp: , Rfl:  .   Cyanocobalamin (B-12) 1000 MCG CAPS, Take 1 capsule by mouth daily., Disp: , Rfl:  .  docusate sodium (COLACE) 100 MG capsule, Take 100 mg by mouth 2 (two) times daily., Disp: , Rfl:  .  escitalopram (LEXAPRO) 10 MG tablet, Take 1 tablet (10 mg total) by mouth daily., Disp: 30 tablet, Rfl: 1 .  fish oil-omega-3 fatty acids 1000 MG capsule, Take 1 g by mouth daily., Disp: , Rfl:  .  Multiple Vitamins-Minerals (MULTIVITAMIN WITH MINERALS) tablet, Take 1 tablet by mouth daily., Disp: , Rfl:  .  NON FORMULARY, Take 3 tablets by mouth daily., Disp: , Rfl:  .  NON FORMULARY, Take 2 capsules by mouth daily., Disp: , Rfl:  .  OMEPRAZOLE PO, Take 1 tablet by mouth daily., Disp: , Rfl:   No Known Allergies  I personally reviewed active problem list, medication list, allergies, health maintenance, notes from last encounter, lab results with the patient/caregiver today.   ROS Constitutional: Negative for fever or weight change.  Respiratory: Negative for cough and shortness of breath.   Cardiovascular: Negative for chest pain or palpitations.  Gastrointestinal: Negative for abdominal pain, no bowel changes.  Musculoskeletal: Negative for gait problem or joint swelling.  Skin: Negative for rash.  Neurological: Negative for dizziness or headache.  No other specific complaints in a complete review of systems (except as listed in HPI above).  Objective  Vitals:   10/02/18 0831  BP: 132/82  Pulse: 72  Resp: 14  Temp: 98.2 F (36.8 C)  TempSrc: Oral  SpO2: 99%  Weight: 273 lb 6.4 oz (124 kg)  Height: 5' 5.5" (1.664 m)    Body mass index is 44.8 kg/m.  Physical Exam Constitutional: Patient appears well-developed and well-nourished. No distress. Obese HENT: Head: Normocephalic and atraumatic. Neck: Normal range of motion. Neck supple. No JVD present. Cardiovascular: Normal rate, regular rhythm and normal heart sounds.  No murmur heard. No BLE edema. Pulmonary/Chest: Effort normal and  breath sounds normal. No respiratory distress. Musculoskeletal: Normal range of motion, no joint effusions. No gross deformities Neurological: Pt is alert and oriented to person, place, and time. No cranial nerve deficit. Coordination, balance, strength, speech and gait are normal.  Skin: Skin is warm and dry. No rash noted. No erythema.  Psychiatric: Patient has a normal mood and affect. behavior is normal. Judgment and thought content normal.  No results found for this or any previous visit (from the past 72 hour(s)).  PHQ2/9: Depression screen [facility] 2/9 08/29/2018  07/14/2018  Decreased Interest 0 0  Down, Depressed, Hopeless 0 0  PHQ - 2 Score 0 0  Altered sleeping 0 3  Tired, decreased energy 1 3  Change in appetite 0 0  Feeling bad or failure about yourself  0 0  Trouble concentrating 0 0  Moving slowly or fidgety/restless 0 0  Suicidal thoughts 0 0  PHQ-9 Score 1 6  Difficult doing work/chores Not difficult at all Not difficult at all   PHQ-2/9 Result is negative.    Fall Risk: Fall Risk  08/29/2018 07/14/2018  Falls in the past year? 0 0  Number falls in past yr: 0 -  Injury with Fall? 0 -  Follow up Falls evaluation completed -    Assessment & Plan  1. Anxiety - escitalopram (LEXAPRO) 20 MG tablet; Take 1 tablet (20 mg total) by mouth daily.  Dispense: 30 tablet; Refill: 1 - hydrOXYzine (ATARAX/VISTARIL) 10 MG tablet; Take 1 tablet (10 mg total) by mouth every 8 (eight) hours as needed for anxiety.  Dispense: 30 tablet; Refill: 0 - Increase Lexapro  To 20mg , check back in 6-8 weeks, if still not improving, will consider Prozac or other antidepressant medication.  2. Class 3 severe obesity due to excess calories without serious comorbidity with body mass index (BMI) of 40.0 to 44.9 in adult [facility]) - Discussed importance of 150 minutes of physical activity weekly, eat two servings of fish weekly, eat one serving of tree nuts ( cashews, pistachios, pecans, almonds.Marland Kitchen) every  other day, eat 6 servings of fruit/vegetables daily and drink plenty of water and avoid sweet beverages.   3. Smoker - DG Chest 2 View; Future - albuterol (VENTOLIN HFA) 108 (90 Base) MCG/ACT inhaler; Inhale 2 puffs into the lungs every 6 (six) hours as needed for wheezing or shortness of breath.  Dispense: 1 Inhaler; Refill: 0  4. Shortness of breath - DG Chest 2 View; Future - albuterol (VENTOLIN HFA) 108 (90 Base) MCG/ACT inhaler; Inhale 2 puffs into the lungs every 6 (six) hours as needed for wheezing or shortness of breath.  Dispense: 1 Inhaler; Refill: 0  5. Cough - DG Chest 2 View; Future - albuterol (VENTOLIN HFA) 108 (90 Base) MCG/ACT inhaler; Inhale 2 puffs into the lungs every 6 (six) hours as needed for wheezing or shortness of breath.  Dispense: 1 Inhaler; Refill: 0

## 2018-10-02 NOTE — Addendum Note (Signed)
Addended by: Hubbard Hartshorn on: 10/02/2018 08:56 AM   Modules accepted: Orders

## 2018-10-03 ENCOUNTER — Ambulatory Visit: Payer: BLUE CROSS/BLUE SHIELD | Admitting: Family Medicine

## 2018-10-05 LAB — HM MAMMOGRAPHY

## 2018-10-24 ENCOUNTER — Other Ambulatory Visit: Payer: Self-pay | Admitting: Obstetrics & Gynecology

## 2018-10-31 ENCOUNTER — Encounter: Payer: Self-pay | Admitting: Family Medicine

## 2018-10-31 DIAGNOSIS — D259 Leiomyoma of uterus, unspecified: Secondary | ICD-10-CM | POA: Insufficient documentation

## 2018-11-02 ENCOUNTER — Encounter: Payer: Self-pay | Admitting: Family Medicine

## 2018-11-20 ENCOUNTER — Other Ambulatory Visit: Payer: Self-pay

## 2018-11-20 ENCOUNTER — Encounter: Payer: Self-pay | Admitting: Family Medicine

## 2018-11-20 ENCOUNTER — Ambulatory Visit (INDEPENDENT_AMBULATORY_CARE_PROVIDER_SITE_OTHER): Payer: BLUE CROSS/BLUE SHIELD | Admitting: Family Medicine

## 2018-11-20 VITALS — BP 116/72 | HR 116 | Temp 96.6°F | Resp 14 | Ht 64.0 in | Wt 259.2 lb

## 2018-11-20 DIAGNOSIS — F341 Dysthymic disorder: Secondary | ICD-10-CM | POA: Diagnosis not present

## 2018-11-20 DIAGNOSIS — K219 Gastro-esophageal reflux disease without esophagitis: Secondary | ICD-10-CM

## 2018-11-20 DIAGNOSIS — Z6841 Body Mass Index (BMI) 40.0 and over, adult: Secondary | ICD-10-CM

## 2018-11-20 DIAGNOSIS — F419 Anxiety disorder, unspecified: Secondary | ICD-10-CM | POA: Diagnosis not present

## 2018-11-20 DIAGNOSIS — K5900 Constipation, unspecified: Secondary | ICD-10-CM

## 2018-11-20 DIAGNOSIS — J3089 Other allergic rhinitis: Secondary | ICD-10-CM

## 2018-11-20 MED ORDER — POLYETHYLENE GLYCOL 3350 17 GM/SCOOP PO POWD: 17.0000 g | Freq: Every day | ORAL | 1 refills | Status: DC

## 2018-11-20 MED ORDER — OMEPRAZOLE 20 MG PO CPDR: 20.0000 mg | DELAYED_RELEASE_CAPSULE | Freq: Every day | ORAL | 1 refills | Status: DC

## 2018-11-20 MED ORDER — CETIRIZINE HCL 10 MG PO TABS: 10.0000 mg | ORAL_TABLET | Freq: Every day | ORAL | 1 refills | Status: DC

## 2018-11-20 NOTE — Progress Notes (Signed)
Name: Anna Keller   MRN: [id number]    DOB: Jan 17, 1971   Date:11/20/2018       Progress Note  Subjective  Chief Complaint  Chief Complaint  Patient presents with  . Follow-up    HPI  Anxiety: She used to take Xanax for several years, and this workedwell for her. 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 She used to get hot flashesbut these have resolved, still having occasional night sweats, she also has mood swings irritability, and worries constantly.She was initiated on Effexor and was at 75mg  and this was only helping a little bit, so we switched her to Lexapro and Buspar.  Buspar caused her to not feel well - very tired, so we stopped this medication.  Increased Lexapro to 20mg  at last visit and it is still not helping much at all - she would like to wean off. She has tried Wellbutrin for smoking several years ago and it did not help. Her GYN did rx PRN Xanax at her last visit and this has been working for her.  Obesity: She is not able to exercise due to pain.  She eats whatever she wants because she is constantly hungry. She had IUD removed by GYN (Dr. Andrez Grime Pinn with [facility]) - had this removed to see if mood/obesity were related to menopausal changes.  She did start phentermine since we last spoke - Rx'd by her GYN, and is down 22lbs.  She is congratulated on this weight loss.  Does not feel that her anxiety has been worse since starting this medication.    GERD/Constipation: Taking Miralax daily - no blood in stool recently - was told previously that her blood in stool was from hemorrhoids. She notes that she has been taking omeprazole daily for years and does not want to come off of it - discussed risk of long-term use of PPI's.   Patient Active Problem List   Diagnosis Date Noted  . Fibroid uterus 10/31/2018  . Arthralgia 07/14/2018  . Family history of arthritis 07/14/2018  . Peeling  skin 07/14/2018  . Class 3 severe obesity due to excess calories with body mass index (BMI) of 40.0 to 44.9 in adult ([place]) 07/14/2018  . Bright red rectal bleeding 07/14/2018  . Anxiety 07/14/2018  . Dysthymia 07/14/2018    History reviewed. No pertinent surgical history.  Family History  Problem Relation Age of Onset  . Hypertension Mother   . Arthritis Father   . Heart disease Sister   . Pulmonary Hypertension Sister   . COPD Sister   . Diabetes Sister   . Breast cancer Maternal Grandfather   . Heart attack Paternal Grandmother   . Breast cancer Maternal Aunt     Social History   Socioeconomic History  . Marital status: Married    Spouse name: Legrand Como  . Number of children: 1  . Years of education: 67  . Highest education level: High school graduate  Occupational History  . Not on file  Social Needs  . Financial resource strain: Not hard at all  . Food insecurity    Worry: Never true    Inability: Never true  . Transportation needs    Medical: No    Non-medical: No  Tobacco Use  . Smoking status: Former Smoker    Packs/day: 1.00    Years: 25.00    Pack years: 25.00    Types: Cigarettes  Quit date: 01/09/2016    Years since quitting: 2.8  . Smokeless tobacco: Never Used  Substance and Sexual Activity  . Alcohol use: Not Currently  . Drug use: No  . Sexual activity: Yes    Partners: Male    Birth control/protection: None  Lifestyle  . Physical activity    Days per week: 0 days    Minutes per session: 0 min  . Stress: Only a little  Relationships  . Social connections    Talks on phone: More than three times a week    Gets together: More than three times a week    Attends religious service: Never    Active member of club or organization: No    Attends meetings of clubs or organizations: Never    Relationship status: Married  . Intimate partner violence    Fear of current or ex partner: No    Emotionally abused: No    Physically abused: No     Forced sexual activity: No  Other Topics Concern  . Not on file  Social History Narrative  . Not on file     Current Outpatient Medications:  .  albuterol (VENTOLIN HFA) 108 (90 Base) MCG/ACT inhaler, Inhale 2 puffs into the lungs every 6 (six) hours as needed for wheezing or shortness of breath., Disp: 1 Inhaler, Rfl: 0 .  ALPRAZolam (XANAX) 0.5 MG tablet, Take 0.5 mg by mouth 3 (three) times daily as needed., Disp: , Rfl:  .  Cholecalciferol (VITAMIN D3 PO), Take 1 capsule by mouth daily., Disp: , Rfl:  .  meloxicam (MOBIC) 15 MG tablet, Take 15 mg by mouth daily., Disp: , Rfl:  .  phentermine (ADIPEX-P) 37.5 MG tablet, Take 37.5 mg by mouth daily., Disp: , Rfl:  .  fish oil-omega-3 fatty acids 1000 MG capsule, Take 1 g by mouth daily., Disp: , Rfl:  .  Multiple Vitamins-Minerals (MULTIVITAMIN WITH MINERALS) tablet, Take 1 tablet by mouth daily., Disp: , Rfl:  .  NON FORMULARY, Take 3 tablets by mouth daily., Disp: , Rfl:  .  NON FORMULARY, Take 2 capsules by mouth daily., Disp: , Rfl:   No Known Allergies  I personally reviewed active problem list, medication list, allergies, notes from last encounter, lab results with the patient/caregiver today.   ROS  Ten systems reviewed and is negative except as mentioned in HPI  Objective  Vitals:   11/20/18 0919  BP: 116/72  Pulse: (!) 116  Resp: 14  Temp: (!) 96.6 F (35.9 C)  SpO2: 96%  Weight: 259 lb 3.2 oz (117.6 kg)  Height: 5\' 4"  (1.626 m)    Body mass index is 44.49 kg/m.  Physical Exam  Constitutional: Patient appears well-developed and well-nourished. No distress.  HENT: Head: Normocephalic and atraumatic. Eyes: Conjunctivae and EOM are normal. No scleral icterus. Neck: Normal range of motion. Neck supple. No JVD present. No thyromegaly present.  Cardiovascular: Normal rate, regular rhythm and normal heart sounds.  No murmur heard. No BLE edema. Pulmonary/Chest: Effort normal and breath sounds normal. No  respiratory distress. Musculoskeletal: Normal range of motion, no joint effusions. No gross deformities Neurological: Pt is alert and oriented to person, place, and time. No cranial nerve deficit. Coordination, balance, strength, speech and gait are normal.  Skin: Skin is warm and dry. No rash noted. No erythema.  Psychiatric: Patient has a normal mood and affect. behavior is normal. Judgment and thought content normal.  No results found for this or any previous visit (  from the past 72 hour(s)).  PHQ2/9: Depression screen [facility] 2/9 11/20/2018 10/02/2018 08/29/2018 07/14/2018  Decreased Interest 0 1 0 0  Down, Depressed, Hopeless 0 0 0 0  PHQ - 2 Score 0 1 0 0  Altered sleeping 0 0 0 3  Tired, decreased energy 0 1 1 3   Change in appetite 0 3 0 0  Feeling bad or failure about yourself  0 0 0 0  Trouble concentrating 0 0 0 0  Moving slowly or fidgety/restless 0 0 0 0  Suicidal thoughts 0 0 0 0  PHQ-9 Score 0 5 1 6   Difficult doing work/chores Not difficult at all Not difficult at all Not difficult at all Not difficult at all   PHQ-2/9 Result is negative.    Fall Risk: Fall Risk  11/20/2018 10/02/2018 08/29/2018 07/14/2018  Falls in the past year? 0 0 0 0  Number falls in past yr: 0 0 0 -  Injury with Fall? 0 0 0 -  Follow up - - Falls evaluation completed -    Functional Status Survey: Is the patient deaf or have difficulty hearing?: No Does the patient have difficulty seeing, even when wearing glasses/contacts?: No Does the patient have difficulty concentrating, remembering, or making decisions?: No Does the patient have difficulty walking or climbing stairs?: No Does the patient have difficulty dressing or bathing?: No Does the patient have difficulty doing errands alone such as visiting a doctor's office or shopping?: No  Assessment & Plan  1. Anxiety - Wean off of Lexapro per patient request to come off of medication; Xanax with GYN MD for now.  Will message if needing daily  medication or wanting genetic screening or referral to psychiatry.  2. Class 3 severe obesity due to excess calories without serious comorbidity with body mass index (BMI) of 40.0 to 44.9 in adult [facility]) - Phentermine p[er GYN; Discussed importance of 150 minutes of physical activity weekly, eat two servings of fish weekly, eat one serving of tree nuts ( cashews, pistachios, pecans, almonds.Marland Kitchen) every other day, eat 6 servings of fruit/vegetables daily and drink plenty of water and avoid sweet beverages.   3. Gastroesophageal reflux disease without esophagitis - Discussed long-term effects of PPI, wants to remain on; avoid trigers - omeprazole (PRILOSEC) 20 MG capsule; Take 1 capsule (20 mg total) by mouth daily.  Dispense: 90 capsule; Refill: 1  4. Dysthymia - See above.  5. Non-seasonal allergic rhinitis, unspecified trigger - cetirizine (ZYRTEC) 10 MG tablet; Take 1 tablet (10 mg total) by mouth daily.  Dispense: 90 tablet; Refill: 1  6. Constipation, unspecified constipation type - polyethylene glycol powder (GLYCOLAX/MIRALAX) 17 GM/SCOOP powder; Take 17 g by mouth daily.  Dispense: 3350 g; Refill: 1

## 2018-11-20 NOTE — Patient Instructions (Signed)
Please message me after seeing Dr. Alwyn Pea (August 6) if you need referral to psychiatry and/or if you would like a referral to psychiatry and/or to consider Trintellix as a daily medication.  Wean off of the Lexapro -Take 1/2 tablet for 2 weeks, then stop.

## 2018-11-21 ENCOUNTER — Encounter: Payer: Self-pay | Admitting: Family Medicine

## 2018-11-21 DIAGNOSIS — J3089 Other allergic rhinitis: Secondary | ICD-10-CM

## 2018-11-21 DIAGNOSIS — K5900 Constipation, unspecified: Secondary | ICD-10-CM

## 2018-11-22 MED ORDER — CETIRIZINE HCL 10 MG PO TABS: 10.0000 mg | ORAL_TABLET | Freq: Every day | ORAL | 3 refills | Status: DC

## 2018-11-22 MED ORDER — POLYETHYLENE GLYCOL 3350 17 GM/SCOOP PO POWD: 17.0000 g | Freq: Every day | ORAL | 3 refills | Status: AC

## 2018-11-22 NOTE — Addendum Note (Signed)
Addended by: Hubbard Hartshorn on: 11/22/2018 12:35 PM   Modules accepted: Orders

## 2018-12-26 ENCOUNTER — Ambulatory Visit: Admit: 2018-12-26 | Payer: BLUE CROSS/BLUE SHIELD | Admitting: Obstetrics & Gynecology

## 2018-12-26 SURGERY — HYSTERECTOMY, TOTAL, LAPAROSCOPIC, WITH SALPINGECTOMY
Anesthesia: General

## 2019-03-23 ENCOUNTER — Other Ambulatory Visit: Payer: Self-pay

## 2019-03-23 ENCOUNTER — Encounter: Payer: Self-pay | Admitting: Family Medicine

## 2019-03-23 ENCOUNTER — Ambulatory Visit (INDEPENDENT_AMBULATORY_CARE_PROVIDER_SITE_OTHER): Payer: BLUE CROSS/BLUE SHIELD | Admitting: Family Medicine

## 2019-03-23 VITALS — BP 122/72 | HR 100 | Temp 97.5°F | Resp 16 | Ht 64.0 in | Wt 240.4 lb

## 2019-03-23 DIAGNOSIS — F341 Dysthymic disorder: Secondary | ICD-10-CM

## 2019-03-23 DIAGNOSIS — F4321 Adjustment disorder with depressed mood: Secondary | ICD-10-CM | POA: Diagnosis not present

## 2019-03-23 DIAGNOSIS — L309 Dermatitis, unspecified: Secondary | ICD-10-CM

## 2019-03-23 DIAGNOSIS — K219 Gastro-esophageal reflux disease without esophagitis: Secondary | ICD-10-CM | POA: Insufficient documentation

## 2019-03-23 DIAGNOSIS — F419 Anxiety disorder, unspecified: Secondary | ICD-10-CM | POA: Diagnosis not present

## 2019-03-23 DIAGNOSIS — Z6841 Body Mass Index (BMI) 40.0 and over, adult: Secondary | ICD-10-CM

## 2019-03-23 DIAGNOSIS — K5900 Constipation, unspecified: Secondary | ICD-10-CM

## 2019-03-23 NOTE — Progress Notes (Signed)
Name: TONIMARIE CALVER   MRN: [id number]    DOB: [birth year]   Date:03/23/2019       Progress Note  Subjective  Chief Complaint  Chief Complaint  Patient presents with  . Follow-up    4 month recheck    HPI  Anxiety: Has struggled with anxiety for many years.  She is seeing GYN for management now - taking Xanax - just PRN; Zoloft was increased yesterday to 100mg .  She has been struggling with her father's death - he was hit by tractor trailer December 10 2018, and she had to decide to take him off of life support on her birthday December 11 2019.  She tried counseling for a few weeks but it did not help.  She is not talking about this to anyone right now - not even her husband.  Denies SI/HI.  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 She used to get hot flashesbut these have resolved, still havingoccasionalnight sweats, she also has mood swings irritability, and worries constantly. Prior medications that were unsuccessful - Effexor, Buspar, Lexapro, Wellbutrin.   Office Visit from 03/23/2019 in [facility]  PHQ-9 Total Score  13     Obesity: She is not able to exercise due to pain. She eating what she wants right now.  Taking phentermine or other stimulant for   GERD/Constipation: Taking Miralax daily - no blood in stool recently - was told previously that her blood in stool was from hemorrhoids. She notes that she has been taking omeprazole daily for years and does not want to come off of it - she is aware of risk of long-term use of PPI's.  Denies abdominal pain, no concerns today.  Eczema: Taking methotrexate + folic acid- she has been on for about 4 weeks - increasing dose slowly.  Seeing Dermatology with close follow up. She has noticed a slight decrease in her symptoms.   Patient Active Problem List   Diagnosis Date Noted  . Grief reaction 03/23/2019  . Gastroesophageal reflux disease without  esophagitis 03/23/2019  . Constipation 03/23/2019  . Eczema 03/23/2019  . Fibroid uterus 10/31/2018  . Arthralgia 07/14/2018  . Family history of arthritis 07/14/2018  . Peeling skin 07/14/2018  . Class 3 severe obesity due to excess calories with body mass index (BMI) of 40.0 to 44.9 in adult ([place]) 07/14/2018  . Bright red rectal bleeding 07/14/2018  . Anxiety 07/14/2018  . Dysthymia 07/14/2018    History reviewed. No pertinent surgical history.  Family History  Problem Relation Age of Onset  . Hypertension Mother   . Arthritis Father   . Heart disease Sister   . Pulmonary Hypertension Sister   . COPD Sister   . Diabetes Sister   . Breast cancer Maternal Grandfather   . Heart attack Paternal Grandmother   . Breast cancer Maternal Aunt     Social History   Socioeconomic History  . Marital status: Married    Spouse name: Legrand Como  . Number of children: 1  . Years of education: 45  . Highest education level: High school graduate  Occupational History  . Not on file  Social Needs  . Financial resource strain: Not hard at all  . Food insecurity    Worry: Never true    Inability: Never true  . Transportation needs    Medical: No    Non-medical: No  Tobacco Use  . Smoking status: Former  Smoker    Packs/day: 1.00    Years: 25.00    Pack years: 25.00    Types: Cigarettes    Quit date: 01/09/2016    Years since quitting: 3.2  . Smokeless tobacco: Never Used  Substance and Sexual Activity  . Alcohol use: Not Currently  . Drug use: No  . Sexual activity: Yes    Partners: Male    Birth control/protection: None  Lifestyle  . Physical activity    Days per week: 0 days    Minutes per session: 0 min  . Stress: Only a little  Relationships  . Social connections    Talks on phone: More than three times a week    Gets together: More than three times a week    Attends religious service: Never    Active member of club or organization: No    Attends meetings of clubs or  organizations: Never    Relationship status: Married  . Intimate partner violence    Fear of current or ex partner: No    Emotionally abused: No    Physically abused: No    Forced sexual activity: No  Other Topics Concern  . Not on file  Social History Narrative  . Not on file     Current Outpatient Medications:  .  albuterol (VENTOLIN HFA) 108 (90 Base) MCG/ACT inhaler, Inhale 2 puffs into the lungs every 6 (six) hours as needed for wheezing or shortness of breath., Disp: 1 Inhaler, Rfl: 0 .  ALPRAZolam (XANAX) 0.5 MG tablet, Take 0.5 mg by mouth 3 (three) times daily as needed., Disp: , Rfl:  .  cetirizine (ZYRTEC) 10 MG tablet, Take 1 tablet (10 mg total) by mouth daily., Disp: 90 tablet, Rfl: 3 .  Cholecalciferol (VITAMIN D3 PO), Take 1 capsule by mouth daily., Disp: , Rfl:  .  meloxicam (MOBIC) 15 MG tablet, Take 15 mg by mouth daily., Disp: , Rfl:  .  Multiple Vitamins-Minerals (MULTIVITAMIN WITH MINERALS) tablet, Take 1 tablet by mouth daily., Disp: , Rfl:  .  omeprazole (PRILOSEC) 20 MG capsule, Take 1 capsule (20 mg total) by mouth daily., Disp: 90 capsule, Rfl: 1 .  phentermine (ADIPEX-P) 37.5 MG tablet, Take 37.5 mg by mouth daily., Disp: , Rfl:  .  polyethylene glycol powder (GLYCOLAX/MIRALAX) 17 GM/SCOOP powder, Take 17 g by mouth daily., Disp: 3350 g, Rfl: 3  No Known Allergies  I personally reviewed active problem list, medication list, allergies, notes from last encounter, lab results with the patient/caregiver today.   ROS  Constitutional: Negative for fever or weight change.  Respiratory: Negative for cough and shortness of breath.   Cardiovascular: Negative for chest pain or palpitations.  Gastrointestinal: Negative for abdominal pain, no bowel changes.  Musculoskeletal: Negative for gait problem or joint swelling.  Skin: Positive for rash.  Neurological: Negative for dizziness or headache.  No other specific complaints in a complete review of systems (except  as listed in HPI above).  Objective  Vitals:   03/23/19 0908  BP: 122/72  Pulse: 100  Resp: 16  Temp: (!) 97.5 F (36.4 C)  TempSrc: Temporal  SpO2: 94%  Weight: 240 lb 6.4 oz (109 kg)  Height: 5\' 4"  (1.626 m)    Body mass index is 41.26 kg/m.  Physical Exam  Constitutional: Patient appears well-developed and well-nourished. No distress.  HENT: Head: Normocephalic and atraumatic.  Eyes: Conjunctivae and EOM are normal. No scleral icterus.   Neck: Normal range of motion. Neck supple.  No JVD present. Cardiovascular: Normal rate, regular rhythm and normal heart sounds.  No murmur heard. No BLE edema. Pulmonary/Chest: Effort normal and breath sounds normal. No respiratory distress. Musculoskeletal: Normal range of motion, no joint effusions. No gross deformities Neurological: Pt is alert and oriented to person, place, and time. No cranial nerve deficit. Coordination, balance, strength, speech and gait are normal.  Skin: Skin is warm and dry. No rash noted. No erythema.  Psychiatric: Patient has a normal mood and affect. behavior is normal. Judgment and thought content normal.  No results found for this or any previous visit (from the past 72 hour(s)).  PHQ2/9: Depression screen [facility] 2/9 03/23/2019 11/20/2018 10/02/2018 08/29/2018 07/14/2018  Decreased Interest 2 0 1 0 0  Down, Depressed, Hopeless 2 0 0 0 0  PHQ - 2 Score 4 0 1 0 0  Altered sleeping 3 0 0 0 3  Tired, decreased energy 3 0 1 1 3   Change in appetite 0 0 3 0 0  Feeling bad or failure about yourself  3 0 0 0 0  Trouble concentrating 0 0 0 0 0  Moving slowly or fidgety/restless 0 0 0 0 0  Suicidal thoughts 0 0 0 0 0  PHQ-9 Score 13 0 5 1 6   Difficult doing work/chores Somewhat difficult Not difficult at all Not difficult at all Not difficult at all Not difficult at all   PHQ-2/9 Result is positive.    Fall Risk: Fall Risk  03/23/2019 11/20/2018 10/02/2018 08/29/2018 07/14/2018  Falls in the past year? 0 0 0 0 0   Number falls in past yr: 0 0 0 0 -  Injury with Fall? 0 0 0 0 -  Follow up Falls evaluation completed - - Falls evaluation completed -     Assessment & Plan  1. Anxiety 2. Dysthymia 3. Grief reaction - Zoloft 100mg , Xanax PRN; strongly recommend grief counseling.  She is safe and denies SI/HI.  She is working with her GYN Dr. Alwyn Pea at this time.  4. Class 3 severe obesity due to excess calories without serious comorbidity with body mass index (BMI) of 40.0 to 44.9 in adult [facility]) - Discussed importance of 150 minutes of physical activity weekly, eat two servings of fish weekly, eat one serving of tree nuts ( cashews, pistachios, pecans, almonds.Marland Kitchen) every other day, eat 6 servings of fruit/vegetables daily and drink plenty of water and avoid sweet beverages.   5. Gastroesophageal reflux disease without esophagitis - Stable without issues, continuing omeprazole - Recent CMP and CBC with dermatology was normal (see care everywhere)  6. Constipation, unspecified constipation type - Taking miralax, no issues  7. Eczema, unspecified type - Taking methotrexate.

## 2019-04-13 ENCOUNTER — Ambulatory Visit (INDEPENDENT_AMBULATORY_CARE_PROVIDER_SITE_OTHER)
Admission: RE | Admit: 2019-04-13 | Discharge: 2019-04-13 | Disposition: A | Discharge status: Home / Self Care | Payer: BLUE CROSS/BLUE SHIELD | Source: Ambulatory Visit

## 2019-04-13 DIAGNOSIS — M545 Low back pain, unspecified: Secondary | ICD-10-CM

## 2019-04-13 NOTE — Discharge Instructions (Signed)
Will go in person to have urine checked if symptoms do not continue to improve with ibuprofen  Continue conservative management of rest, ice, and heat/ gentle stretches Continue with ibuprofen as needed for pain Follow up in person or go to the ER if you have any new or worsening symptoms (fever, chills, chest pain, abdominal pain, changes in bowel or bladder habits, pain radiating into lower legs, etc...)

## 2019-04-13 NOTE — ED Provider Notes (Signed)
[place] Virtual Visit via Video Note:  Anna Keller  initiated request for Telemedicine visit with [facility] team. I connected with Anna Keller  on 04/13/2019 at 12:45 PM  for a synchronized telemedicine visit using a telephone enabled HIPPA compliant telemedicine application. I verified that I am speaking with Anna Keller  using two identifiers. Lestine Box, PA-C  was physically located in a [facility] site and Anna Keller was located at a different location.   The limitations of evaluation and management by telemedicine as well as the availability of in-person appointments were discussed. Patient was informed that she  may incur a bill ( including co-pay) for this virtual visit encounter. Anna Keller  expressed understanding and gave verbal consent to proceed with virtual visit.   Anna Keller 04/13/19 Arrival Time: [id number]  CC: Back PAIN  SUBJECTIVE: History from: patient. Anna Keller is a 48 y.o. female complains of RT low back pain that began 4-5 days ago.  Denies a precipitating event or specific injury.  Localizes the pain to the RT low back.  Describes the pain as improving, constant and achy/ throbbing in character.  Has tried OTC medications like ibuprofen with relief.  Denies aggravating factors.  Denies similar symptoms in the past.  Denies fever, chills, urinary frequency, urinary urgency, dysuria, rash, weakness, numbness and tingling, saddle paresthesia, loss of bowel or bladder function.    Takes methotrexate for eczema  ROS: As per HPI.  All other pertinent ROS negative.     Past Medical History:  Diagnosis Date  . Depression   . Headache    History reviewed. No pertinent surgical history. No Known Allergies No current facility-administered medications on file prior to encounter.   Current Outpatient Medications on File Prior to Encounter  Medication Sig Dispense Refill  .  methotrexate (RHEUMATREX) 2.5 MG tablet Take 2.5 mg by mouth once a week. Caution:Chemotherapy. Protect from light.    Marland Kitchen albuterol (VENTOLIN HFA) 108 (90 Base) MCG/ACT inhaler Inhale 2 puffs into the lungs every 6 (six) hours as needed for wheezing or shortness of breath. 1 Inhaler 0  . ALPRAZolam (XANAX) 0.5 MG tablet Take 0.5 mg by mouth 3 (three) times daily as needed.    . cetirizine (ZYRTEC) 10 MG tablet Take 1 tablet (10 mg total) by mouth daily. 90 tablet 3  . Cholecalciferol (VITAMIN D3 PO) Take 1 capsule by mouth daily.    . meloxicam (MOBIC) 15 MG tablet Take 15 mg by mouth daily.    . Multiple Vitamins-Minerals (MULTIVITAMIN WITH MINERALS) tablet Take 1 tablet by mouth daily.    Marland Kitchen omeprazole (PRILOSEC) 20 MG capsule Take 1 capsule (20 mg total) by mouth daily. 90 capsule 1  . phentermine (ADIPEX-P) 37.5 MG tablet Take 37.5 mg by mouth daily.    . polyethylene glycol powder (GLYCOLAX/MIRALAX) 17 GM/SCOOP powder Take 17 g by mouth daily. 3350 g 3    OBJECTIVE:  There were no vitals filed for this visit.  General : alert; no distress ENT: No hot potato voice Lungs: normal respiratory effort; speaking in full sentences without difficulty Neuro: no slurred speech Psychological: alert and cooperative; normal mood and affect   ASSESSMENT & PLAN:  1. Acute right-sided low back pain without sciatica    Will go in person to have urine checked if symptoms do not continue to improve with ibuprofen  Continue conservative management of rest, ice, and heat/ gentle stretches Continue with  ibuprofen as needed for pain Follow up in person or go to the ER if you have any new or worsening symptoms (fever, chills, chest pain, abdominal pain, changes in bowel or bladder habits, pain radiating into lower legs, etc...)   I discussed the assessment and treatment plan with the patient. The patient was provided an opportunity to ask questions and all were answered. The patient agreed with the plan  and demonstrated an understanding of the instructions.   The patient was advised to call back or seek an in-person evaluation if the symptoms worsen or if the condition fails to improve as anticipated.  I provided 12 minutes of non-face-to-face time during this encounter.  Lestine Box, PA-C  04/13/2019 12:45 PM     Lestine Box, PA-C 04/13/19 1245

## 2019-04-29 ENCOUNTER — Other Ambulatory Visit: Payer: Self-pay | Admitting: Family Medicine

## 2019-04-29 DIAGNOSIS — K219 Gastro-esophageal reflux disease without esophagitis: Secondary | ICD-10-CM

## 2019-07-29 ENCOUNTER — Other Ambulatory Visit: Payer: Self-pay | Admitting: Family Medicine

## 2019-07-29 DIAGNOSIS — K219 Gastro-esophageal reflux disease without esophagitis: Secondary | ICD-10-CM

## 2019-07-30 NOTE — Telephone Encounter (Signed)
Requested Prescriptions  Pending Prescriptions Disp Refills  . omeprazole (PRILOSEC) 20 MG capsule [Pharmacy Med Name: Omeprazole 20 MG Oral Capsule Delayed Release] 90 capsule 0    Sig: Take 1 capsule by mouth once daily     Gastroenterology: Proton Pump Inhibitors Passed - 07/29/2019 11:07 PM      Passed - Valid encounter within last 12 months    Recent Outpatient Visits          4 months ago [place], FNP   8 months ago [place], FNP   10 months ago [place], [place]   11 months ago [place], [place]   1 year ago Arthralgia, unspecified joint   [place], [place], [place]      Future Appointments            In 2 months Hubbard Hartshorn, [facility], [facility]

## 2019-10-02 ENCOUNTER — Ambulatory Visit: Payer: BLUE CROSS/BLUE SHIELD | Admitting: Family Medicine

## 2020-06-11 ENCOUNTER — Other Ambulatory Visit: Payer: Self-pay | Admitting: Nurse Practitioner

## 2020-06-11 DIAGNOSIS — Z1231 Encounter for screening mammogram for malignant neoplasm of breast: Secondary | ICD-10-CM

## 2020-07-01 ENCOUNTER — Ambulatory Visit
Admission: RE | Admit: 2020-07-01 | Discharge: 2020-07-01 | Disposition: A | Discharge status: Home / Self Care | Payer: 59 | Source: Ambulatory Visit | Attending: Nurse Practitioner | Admitting: Nurse Practitioner

## 2020-07-01 ENCOUNTER — Other Ambulatory Visit: Payer: Self-pay

## 2020-07-01 DIAGNOSIS — Z1231 Encounter for screening mammogram for malignant neoplasm of breast: Secondary | ICD-10-CM | POA: Diagnosis not present

## 2021-02-11 ENCOUNTER — Other Ambulatory Visit: Payer: Self-pay | Admitting: Obstetrics & Gynecology

## 2021-03-21 ENCOUNTER — Ambulatory Visit
Admission: RE | Admit: 2021-03-21 | Discharge: 2021-03-21 | Disposition: A | Discharge status: Home / Self Care | Payer: 59 | Source: Ambulatory Visit

## 2021-03-21 ENCOUNTER — Other Ambulatory Visit: Payer: Self-pay

## 2021-03-21 ENCOUNTER — Ambulatory Visit (INDEPENDENT_AMBULATORY_CARE_PROVIDER_SITE_OTHER): Payer: 59

## 2021-03-21 VITALS — BP 132/91 | HR 94 | Temp 98.3°F | Resp 14 | Ht 64.0 in | Wt 240.3 lb

## 2021-03-21 DIAGNOSIS — J210 Acute bronchiolitis due to respiratory syncytial virus: Secondary | ICD-10-CM | POA: Diagnosis not present

## 2021-03-21 DIAGNOSIS — F17200 Nicotine dependence, unspecified, uncomplicated: Secondary | ICD-10-CM

## 2021-03-21 DIAGNOSIS — R0602 Shortness of breath: Secondary | ICD-10-CM

## 2021-03-21 DIAGNOSIS — R051 Acute cough: Secondary | ICD-10-CM | POA: Diagnosis not present

## 2021-03-21 DIAGNOSIS — R059 Cough, unspecified: Secondary | ICD-10-CM

## 2021-03-21 MED ORDER — PREDNISONE 10 MG (21) PO TBPK: ORAL_TABLET | Freq: Every day | ORAL | 0 refills | Status: DC

## 2021-03-21 MED ORDER — ALBUTEROL SULFATE HFA 108 (90 BASE) MCG/ACT IN AERS: 2.0000 | INHALATION_SPRAY | Freq: Four times a day (QID) | RESPIRATORY_TRACT | 0 refills | Status: DC | PRN

## 2021-03-21 MED ORDER — PROMETHAZINE-DM 6.25-15 MG/5ML PO SYRP: 2.5000 mL | ORAL_SOLUTION | Freq: Four times a day (QID) | ORAL | 0 refills | Status: DC | PRN

## 2021-03-21 NOTE — Discharge Instructions (Addendum)
Take cough meds as needed  Use humidifier as needed for sob  Take tylenol for pain

## 2021-03-21 NOTE — ED Triage Notes (Signed)
Patient states that she has had cough and chest congestion for 2 weeks.  Patient states that she was seen on 03/07/21 and her Respiratory Panel came back positive for RSV.  Patient has not checked her temperature.

## 2021-03-21 NOTE — ED Provider Notes (Signed)
[facility]    CSN: [id number] Arrival date & time: 03/21/21  1307      History   Chief Complaint Chief Complaint  Patient presents with   Appointment   Cough    HPI Anna Keller is a 50 y.o. female.   Pt tested positive for rsv 2 weeks ago, still not feeling well, sob, cough , congestion. Unsure of fever. Has taken otc meds with no relief.    Past Medical History:  Diagnosis Date   Depression    Headache     Patient Active Problem List   Diagnosis Date Noted   Grief reaction 03/23/2019   Gastroesophageal reflux disease without esophagitis 03/23/2019   Constipation 03/23/2019   Eczema 03/23/2019   Fibroid uterus 10/31/2018   Arthralgia 07/14/2018   Family history of arthritis 07/14/2018   Peeling skin 07/14/2018   Class 3 severe obesity due to excess calories with body mass index (BMI) of 40.0 to 44.9 in adult [facility]) 07/14/2018   Bright red rectal bleeding 07/14/2018   Anxiety 07/14/2018   Dysthymia 07/14/2018    History reviewed. No pertinent surgical history.  OB History   No obstetric history on file.      Home Medications    Prior to Admission medications   Medication Sig Start Date End Date Taking? Authorizing Provider  ALPRAZolam Duanne Moron) 0.5 MG tablet Take 0.5 mg by mouth 3 (three) times daily as needed.   Yes [provider]  cetirizine (ZYRTEC) 10 MG tablet Take 1 tablet (10 mg total) by mouth daily. 11/22/18  Yes Hubbard Hartshorn, FNP  Cholecalciferol (VITAMIN D3 PO) Take 1 capsule by mouth daily.   Yes [provider]  Multiple Vitamins-Minerals (MULTIVITAMIN WITH MINERALS) tablet Take 1 tablet by mouth daily.   Yes [provider]  omeprazole (PRILOSEC) 20 MG capsule Take 1 capsule by mouth once daily 07/30/19  Yes Hubbard Hartshorn, FNP  predniSONE (STERAPRED UNI-PAK 21 TAB) 10 MG (21) TBPK tablet Take by mouth daily. Take 6 tabs by mouth daily  for 2 days, then 5 tabs for 2 days, then 4 tabs for 2 days,  then 3 tabs for 2 days, 2 tabs for 2 days, then 1 tab by mouth daily for 2 days 03/21/21  Yes Marney Setting, NP  promethazine-dextromethorphan (PROMETHAZINE-DM) 6.25-15 MG/5ML syrup Take 2.5 mLs by mouth 4 (four) times daily as needed for cough. 03/21/21  Yes Marney Setting, NP  traZODone (DESYREL) 150 MG tablet Take 150 mg by mouth at bedtime. 12/17/20  Yes [provider]  venlafaxine XR (EFFEXOR-XR) 150 MG 24 hr capsule Take 150 mg by mouth daily. 01/15/21  Yes [provider]  albuterol (VENTOLIN HFA) 108 (90 Base) MCG/ACT inhaler Inhale 2 puffs into the lungs every 6 (six) hours as needed for wheezing or shortness of breath. 03/21/21   Marney Setting, NP  celecoxib (CELEBREX) 200 MG capsule Take 200 mg by mouth 2 (two) times daily. 03/02/21   [provider]  meloxicam (MOBIC) 15 MG tablet Take 15 mg by mouth daily. 10/10/18   [provider]  methotrexate (RHEUMATREX) 2.5 MG tablet Take 2.5 mg by mouth once a week. Caution:Chemotherapy. Protect from light.    [provider]  phentermine (ADIPEX-P) 37.5 MG tablet Take 37.5 mg by mouth daily.    [provider]  polyethylene glycol powder (GLYCOLAX/MIRALAX) 17 GM/SCOOP powder Take 17 g by mouth daily. 11/22/18   Hubbard Hartshorn, FNP  Family History Family History  Problem Relation Age of Onset   Hypertension Mother    Arthritis Father    Heart disease Sister    Pulmonary Hypertension Sister    COPD Sister    Diabetes Sister    Breast cancer Maternal Grandmother    Heart attack Paternal Grandmother    Breast cancer Maternal Aunt    Breast cancer Cousin     Social History Social History   Tobacco Use   Smoking status: Former    Packs/day: 1.00    Years: 25.00    Pack years: 25.00    Types: Cigarettes    Quit date: 01/09/2016    Years since quitting: 5.2   Smokeless tobacco: Never  Vaping Use   Vaping Use: Never used  Substance Use Topics   Alcohol use: Not  Currently   Drug use: No     Allergies   Patient has no known allergies.   Review of Systems Review of Systems  Constitutional:  Negative for appetite change, chills and fever.  HENT:  Positive for congestion, postnasal drip, rhinorrhea, sinus pressure, sinus pain and sore throat.   Eyes: Negative.   Respiratory:  Positive for cough and shortness of breath.   Cardiovascular: Negative.   Gastrointestinal: Negative.   Genitourinary: Negative.   Neurological: Negative.     Physical Exam Triage Vital Signs ED Triage Vitals  Enc Vitals Group     BP 03/21/21 1324 (!) 132/91     Pulse Rate 03/21/21 1324 94     Resp 03/21/21 1324 14     Temp 03/21/21 1324 98.3 F (36.8 C)     Temp Source 03/21/21 1324 Oral     SpO2 03/21/21 1324 99 %     Weight 03/21/21 1318 240 lb 4.8 oz (109 kg)     Height 03/21/21 1318 5\' 4"  (1.626 m)     Head Circumference --      Peak Flow --      Pain Score 03/21/21 1318 0     Pain Loc --      Pain Edu? --      Excl. in [place]? --    No data found.  Updated Vital Signs BP (!) 132/91 (BP Location: Left Arm)   Pulse 94   Temp 98.3 F (36.8 C) (Oral)   Resp 14   Ht 5\' 4"  (1.626 m)   Wt 240 lb 4.8 oz (109 kg)   LMP 03/03/2021   SpO2 99%   BMI 41.25 kg/m   Visual Acuity Right Eye Distance:   Left Eye Distance:   Bilateral Distance:    Right Eye Near:   Left Eye Near:    Bilateral Near:     Physical Exam Constitutional:      Appearance: Normal appearance.  HENT:     Right Ear: Tympanic membrane normal.     Left Ear: Tympanic membrane normal.     Nose: Congestion and rhinorrhea present.     Mouth/Throat:     Pharynx: Posterior oropharyngeal erythema present.  Eyes:     Pupils: Pupils are equal, round, and reactive to light.  Cardiovascular:     Rate and Rhythm: Normal rate.  Pulmonary:     Effort: Pulmonary effort is normal.  Abdominal:     General: Abdomen is flat.  Skin:    General: Skin is warm.     Capillary Refill: Capillary  refill takes less than 2 seconds.  Neurological:     General: No  focal deficit present.     Mental Status: She is alert.     UC Treatments / Results  Labs (all labs ordered are listed, but only abnormal results are displayed) Labs Reviewed - No data to display  EKG   Radiology DG Chest 2 View  Result Date: 03/21/2021 CLINICAL DATA:  Chest congestion. EXAM: CHEST - 2 VIEW COMPARISON:  Chest radiograph 10/02/2018 FINDINGS: The cardiomediastinal contours are within normal limits. The lungs are clear. No pneumothorax or pleural effusion. No acute finding in the visualized skeleton. IMPRESSION: No active cardiopulmonary process. Electronically Signed   By: Audie Pinto M.D.   On: 03/21/2021 14:35    Procedures Procedures (including critical care time)  Medications Ordered in UC Medications - No data to display  Initial Impression / Assessment and Plan / UC Course  I have reviewed the triage vital signs and the nursing notes.  Pertinent labs & imaging results that were available during my care of the patient were reviewed by me and considered in my medical decision making (see chart for details).     X ray was negative  Take meds as needed  Your cough may linger for several weeks  Use humidifier  Take otc cough and cold meds  Sx worse go to er   Final Clinical Impressions(s) / UC Diagnoses   Final diagnoses:  Acute cough  Acute bronchiolitis due to respiratory syncytial virus (RSV)     Discharge Instructions      Take cough meds as needed  Use humidifier as needed for sob  Take tylenol for pain       ED Prescriptions     Medication Sig Dispense Auth. Provider   albuterol (VENTOLIN HFA) 108 (90 Base) MCG/ACT inhaler Inhale 2 puffs into the lungs every 6 (six) hours as needed for wheezing or shortness of breath. 1 each Marney Setting, NP   predniSONE (STERAPRED UNI-PAK 21 TAB) 10 MG (21) TBPK tablet Take by mouth daily. Take 6 tabs by mouth daily  for  2 days, then 5 tabs for 2 days, then 4 tabs for 2 days, then 3 tabs for 2 days, 2 tabs for 2 days, then 1 tab by mouth daily for 2 days 42 tablet Morley Kos L, NP   promethazine-dextromethorphan (PROMETHAZINE-DM) 6.25-15 MG/5ML syrup Take 2.5 mLs by mouth 4 (four) times daily as needed for cough. 118 mL Marney Setting, NP      PDMP not reviewed this encounter.   Marney Setting, NP 03/21/21 [phone]

## 2021-04-01 NOTE — H&P (Signed)
Anna Keller is a 50 y.o. female, P: 1-0-0-1 is presenting for hysterectomy and placement of tension free vaginal tape because of uterine fibroids, post menopausal bleeding and stress urinary incontinence. Since 2020 the patient reports worsening leaking of urine that occurs before she can make it to the bathroom as well as with coughing or sneezing. Additionally she had gone more than a year without bleeding since 2020, , only to now bleed 2-3 times a month. This bleeding may last for 5 days with the need to change a pad every 2 hours. Fortunately she has not had any cramping. She denies vaginitis symptoms, dysuria, history of renal stones or pelvic pain. She is not sexually active. A pelvic ultrasound in September 2022 revealed an anteverted uterus, ( 10.6 cm fundus to external os):  8.23 x 8.16 x 8.59 cm, endometrium: 6.11 mm; #5 fibroids: 1. posterior left intramural-3.91 cm;  2. posterior mid-subserosal-3.54 cm; 3. posterior mid subserosal vs pedunculated-3.41 cm; 4.fundal mid subserosal vs pedunculated-2.83 cm and 5. anterior right intramural-2.43 cm; right ovary-3.38 cm with two small cystic structures measuring 1. 7 cm  and 2:22 cm; left ovary-2.31 cm. An endometrial biopsy at the same time showed proliferative endometrium with no hyperplasia or malignancy. A review of both medical or surgical options was given to the patient however she has decided to proceed with definitive therapy in the form of hysterectomy.  She has also consented to have placement of tension free vaginal tape to manage her urinary incontinence.   Past Medical History  OB History: G: 1; P: 1-0-0-1; SVB weighing 9 lbs. 2 oz.  GYN History: menarche : 50 YO;    LMP: 03/25/2021;     Contraception: abstinence; Last PAP smear: 2022 normal with negative HPV  Medical History: Rheumatoid Arthritis, Ankylosing Spondylitis, Eczema, Stress Urinary Incontinence, Insomnia, Depression, Anxiety and (recent) RSV infection.  Surgical  History: 1985 Knee Arthroscopy Denies problems with anesthesia or history of blood transfusions  Family History: Breast Cancer, Hypertension and Arthritis  Social History: Married and Self-Employed;  Former smoker and  no alcohol use.   Medications: Celebrex 200 mg po daily prn Alprazolam 0.5 mg daily prn Folic Acid 1 mg daily  No Known Allergies  Denies sensitivity to peanuts, shellfish, soy, latex or adhesives.   ROS: Admits to glasses, joint pain, constipation and myalgias but  denies headache, vision changes, nasal congestion, dysphagia, tinnitus, dizziness, hoarseness, cough,  chest pain, shortness of breath, nausea, vomiting, diarrhea,constipation,  urinary frequency, urgency  dysuria, hematuria, vaginitis symptoms, pelvic pain, swelling of joints,easy bruising, skin rashes, unexplained weight loss and except as is mentioned in the history of present illness, patient's review of systems is otherwise negative.    Physical Exam  Bp:122/80;  Weight: 274 lbs; Height: 5'7"; BMI: 42.9  Neck: supple without masses or thyromegaly Lungs: Bilateral rhonchi with occasional wheezes in lower lungs bilaterally Heart: regular rate and rhythm Abdomen: soft, non-tender and no organomegaly Pelvic:EGBUS- wnl; vagina-normal rugae; uterus-normal size (exam limited by habitus);  cervix without lesions or motion tenderness; adnexae-no tenderness or masses Extremities:  no clubbing, cyanosis or edema   Assesment: Postmenopausal Bleeding                      Uterine Fibroids                      Female Stress Incontinence   Disposition:  A discussion was held with the patient regarding the indication for her  procedure(s) along with  the risks of surgery.  These risks include, but are not limited to: reaction to anesthesia, damage to adjacent organs, infection, excessive bleeding and erosion of tension free vaginal tape.  The patient verbalized understanding of these risks and has consented to  proceed with a Total Abdominal Hysterectomy, Bilateral Salpingectomy, Placement of Tension Free Vaginal Tape and Cystoscopy at [facility] on April 14, 2021.  CSN# [id number]   Jasher Barkan J. Florene Glen, PA-C  for Dr. Mady Haagensen. [place]

## 2021-04-07 NOTE — Progress Notes (Signed)
Surgical Instructions    Your procedure is scheduled on Tuesday, December 13th, 2022.   Report to [facility] Main Entrance "A" at 05:30 A.M., then check in with the Admitting office.  Call this number if you have problems the morning of surgery:  [phone]   If you have any questions prior to your surgery date call [phone]: Open Monday-Friday 8am-4pm    Remember:  Do not eat or drink after midnight the night before your surgery     Take these medicines the morning of surgery with A SIP OF WATER:  ALPRAZolam (XANAX) cetirizine (ZYRTEC)  omeprazole (PRILOSEC)  venlafaxine XR (EFFEXOR-XR)   If needed:   albuterol (VENTOLIN HFA) - please, bring the inhaler with you the day of surgery  As of today, STOP taking any Aspirin (unless otherwise instructed by your surgeon) Aleve, Naproxen, Ibuprofen, Motrin, Advil, Goody's, BC's, all herbal medications, fish oil, and all vitamins.   After your COVID test   You are not required to quarantine however you are required to wear a well-fitting mask when you are out and around people not in your household.  If your mask becomes wet or soiled, replace with a new one.  Wash your hands often with soap and water for 20 seconds or clean your hands with an alcohol-based hand sanitizer that contains at least 60% alcohol.  Do not share personal items.  Notify your provider: if you are in close contact with someone who has COVID  or if you develop a fever of 100.4 or greater, sneezing, cough, sore throat, shortness of breath or body aches.    The day of surgery:          Do not wear jewelry or makeup Do not wear lotions, powders, perfumes, or deodorant. Do not shave 48 hours prior to surgery.   Do not bring valuables to the hospital. DO Not wear nail polish, gel polish, artificial nails, or any other type of covering on natural nails including finger and toenails. If patients have artificial nails, gel coating, etc. that need to be  removed by a nail salon, please have this removed prior to surgery or surgery may need to be canceled/delayed if the surgeon/ anesthesia feels like the patient is unable to be adequately monitored.              [place] is not responsible for any belongings or valuables.  Do NOT Smoke (Tobacco/Vaping)  24 hours prior to your procedure  If you use a CPAP at night, you may bring your mask for your overnight stay.   Contacts, glasses, hearing aids, dentures or partials may not be worn into surgery, please bring cases for these belongings   For patients admitted to the hospital, discharge time will be determined by your treatment team.   Patients discharged the day of surgery will not be allowed to drive home, and someone needs to stay with them for 24 hours.  NO VISITORS WILL BE ALLOWED IN PRE-OP WHERE PATIENTS ARE PREPPED FOR SURGERY.  ONLY 1 SUPPORT PERSON MAY BE PRESENT IN THE WAITING ROOM WHILE YOU ARE IN SURGERY.  IF YOU ARE TO BE ADMITTED, ONCE YOU ARE IN YOUR ROOM YOU WILL BE ALLOWED TWO (2) VISITORS. 1 (ONE) VISITOR MAY STAY OVERNIGHT BUT MUST ARRIVE TO THE ROOM BY 8pm.  Minor children may have two parents present. Special consideration for safety and communication needs will be reviewed on a case by case basis.  Special instructions:    Oral  Hygiene is also important to reduce your risk of infection.  Remember - BRUSH YOUR TEETH THE MORNING OF SURGERY WITH YOUR REGULAR TOOTHPASTE   - Preparing For Surgery  Before surgery, you can play an important role. Because skin is not sterile, your skin needs to be as free of germs as possible. You can reduce the number of germs on your skin by washing with CHG (chlorahexidine gluconate) Soap before surgery.  CHG is an antiseptic cleaner which kills germs and bonds with the skin to continue killing germs even after washing.     Please do not use if you have an allergy to CHG or antibacterial soaps. If your skin becomes  reddened/irritated stop using the CHG.  Do not shave (including legs and underarms) for at least 48 hours prior to first CHG shower. It is OK to shave your face.  Please follow these instructions carefully.     Shower the NIGHT BEFORE SURGERY and the MORNING OF SURGERY with CHG Soap.   If you chose to wash your hair, wash your hair first as usual with your normal shampoo. After you shampoo, rinse your hair and body thoroughly to remove the shampoo.  Then [employer] and genitals (private parts) with your normal soap and rinse thoroughly to remove soap.  After that Use CHG Soap as you would any other liquid soap. You can apply CHG directly to the skin and wash gently with a scrungie or a clean washcloth.   Apply the CHG Soap to your body ONLY FROM THE NECK DOWN.  Do not use on open wounds or open sores. Avoid contact with your eyes, ears, mouth and genitals (private parts). Wash Face and genitals (private parts)  with your normal soap.   Wash thoroughly, paying special attention to the area where your surgery will be performed.  Thoroughly rinse your body with warm water from the neck down.  DO NOT shower/wash with your normal soap after using and rinsing off the CHG Soap.  Pat yourself dry with a CLEAN TOWEL.  Wear CLEAN PAJAMAS to bed the night before surgery  Place CLEAN SHEETS on your bed the night before your surgery  DO NOT SLEEP WITH PETS.   Day of Surgery:  Take a shower with CHG soap. Wear Clean/Comfortable clothing the morning of surgery Do not apply any deodorants/lotions.   Remember to brush your teeth WITH YOUR REGULAR TOOTHPASTE.   Please read over the following fact sheets that you were given.

## 2021-04-08 ENCOUNTER — Encounter (HOSPITAL_COMMUNITY): Payer: Self-pay

## 2021-04-08 ENCOUNTER — Encounter (HOSPITAL_COMMUNITY)
Admission: RE | Admit: 2021-04-08 | Discharge: 2021-04-08 | Disposition: A | Discharge status: Home / Self Care | Payer: 59 | Source: Ambulatory Visit | Attending: Obstetrics & Gynecology | Admitting: Obstetrics & Gynecology

## 2021-04-08 ENCOUNTER — Other Ambulatory Visit: Payer: Self-pay

## 2021-04-08 DIAGNOSIS — Z01812 Encounter for preprocedural laboratory examination: Secondary | ICD-10-CM | POA: Insufficient documentation

## 2021-04-08 HISTORY — DX: Dermatitis, unspecified: L30.9

## 2021-04-08 HISTORY — DX: Unspecified osteoarthritis, unspecified site: M19.90

## 2021-04-08 HISTORY — DX: Rheumatoid arthritis, unspecified: M06.9

## 2021-04-08 HISTORY — DX: Anxiety disorder, unspecified: F41.9

## 2021-04-08 HISTORY — DX: Ankylosing spondylitis of unspecified sites in spine: M45.9

## 2021-04-08 LAB — TYPE AND SCREEN
ABO/RH(D): O NEG
Antibody Screen: NEGATIVE

## 2021-04-08 LAB — CBC
HCT: 42.8 % (ref 36.0–46.0)
Hemoglobin: 14.1 g/dL (ref 12.0–15.0)
MCH: 32.3 pg (ref 26.0–34.0)
MCHC: 32.9 g/dL (ref 30.0–36.0)
MCV: 97.9 fL (ref 80.0–100.0)
Platelets: 251 10*3/uL (ref 150–400)
RBC: 4.37 MIL/uL (ref 3.87–5.11)
RDW: 13.4 % (ref 11.5–15.5)
WBC: 5.8 10*3/uL (ref 4.0–10.5)
nRBC: 0 % (ref 0.0–0.2)

## 2021-04-08 NOTE — Progress Notes (Signed)
PCP - Evern Bio, NP Cardiologist - denies  PPM/ICD - denies Device Orders - n/a Rep Notified - n/a  Chest x-ray - n/a EKG - n/a Stress Test - denies ECHO - denies Cardiac Cath - denies  Sleep Study - denies CPAP - n/a  Fasting Blood Sugar - n/a  Blood Thinner Instructions: n/a  Aspirin Instructions: Patient was instructed: As of today, STOP taking any Aspirin (unless otherwise instructed by your surgeon) Aleve, Naproxen, Ibuprofen, Motrin, Advil, Goody's, BC's, all herbal medications, fish oil, and all vitamins.  ERAS Protcol - n/a   COVID TEST- the test was scheduled for 04/13/21 @ 10:15 at  [place] [address]. Patient verbalized understanding.   Anesthesia review: no  Patient denies shortness of breath, fever, cough and chest pain at PAT appointment   All instructions explained to the patient, with a verbal understanding of the material. Patient agrees to go over the instructions while at home for a better understanding. Patient also instructed to self quarantine after being tested for COVID-19. The opportunity to ask questions was provided.

## 2021-04-13 ENCOUNTER — Other Ambulatory Visit: Payer: Self-pay

## 2021-04-13 ENCOUNTER — Other Ambulatory Visit
Admission: RE | Admit: 2021-04-13 | Discharge: 2021-04-13 | Disposition: A | Discharge status: Home / Self Care | Payer: 59 | Source: Ambulatory Visit | Attending: Obstetrics & Gynecology | Admitting: Obstetrics & Gynecology

## 2021-04-13 DIAGNOSIS — Z01812 Encounter for preprocedural laboratory examination: Secondary | ICD-10-CM | POA: Diagnosis present

## 2021-04-13 DIAGNOSIS — Z20822 Contact with and (suspected) exposure to covid-19: Secondary | ICD-10-CM

## 2021-04-13 NOTE — Anesthesia Preprocedure Evaluation (Addendum)
Anesthesia Evaluation  Patient identified by MRN, date of birth, ID band Patient awake    Reviewed: Allergy & Precautions, NPO status , Patient's Chart, lab work & pertinent test results  History of Anesthesia Complications Negative for: history of anesthetic complications  Airway Mallampati: II  TM Distance: >3 FB Neck ROM: Full    Dental no notable dental hx. (+) Dental Advisory Given   Pulmonary neg pulmonary ROS, former smoker,    Pulmonary exam normal        Cardiovascular negative cardio ROS Normal cardiovascular exam     Neuro/Psych PSYCHIATRIC DISORDERS Anxiety Depression negative neurological ROS     GI/Hepatic Neg liver ROS, GERD  Medicated,  Endo/Other  Morbid obesity  Renal/GU negative Renal ROS     Musculoskeletal  (+) Arthritis ,   Abdominal   Peds  Hematology negative hematology ROS (+)   Anesthesia Other Findings   Reproductive/Obstetrics                            Anesthesia Physical Anesthesia Plan  ASA: 3  Anesthesia Plan: General   Post-op Pain Management: Tylenol PO (pre-op) and Celebrex PO (pre-op)   Induction:   PONV Risk Score and Plan: 4 or greater and Ondansetron, Dexamethasone, Midazolam and Scopolamine patch - Pre-op  Airway Management Planned: Oral ETT  Additional Equipment:   Intra-op Plan:   Post-operative Plan: Extubation in OR  Informed Consent: I have reviewed the patients History and Physical, chart, labs and discussed the procedure including the risks, benefits and alternatives for the proposed anesthesia with the patient or authorized representative who has indicated his/her understanding and acceptance.     Dental advisory given  Plan Discussed with: Anesthesiologist and CRNA  Anesthesia Plan Comments:        Anesthesia Quick Evaluation

## 2021-04-14 ENCOUNTER — Encounter (HOSPITAL_COMMUNITY)
Admission: RE | Disposition: A | Discharge status: Home / Self Care | Payer: Self-pay | Source: Home / Self Care | Attending: Obstetrics & Gynecology

## 2021-04-14 ENCOUNTER — Other Ambulatory Visit: Payer: Self-pay

## 2021-04-14 ENCOUNTER — Ambulatory Visit (HOSPITAL_COMMUNITY)
Admission: RE | Admit: 2021-04-14 | Discharge: 2021-04-15 | Disposition: A | Discharge status: Home / Self Care | Payer: 59 | Attending: Obstetrics & Gynecology | Admitting: Obstetrics & Gynecology

## 2021-04-14 ENCOUNTER — Inpatient Hospital Stay (HOSPITAL_COMMUNITY): Payer: 59 | Admitting: Certified Registered Nurse Anesthetist

## 2021-04-14 ENCOUNTER — Encounter (HOSPITAL_COMMUNITY): Payer: Self-pay | Admitting: Obstetrics & Gynecology

## 2021-04-14 DIAGNOSIS — D251 Intramural leiomyoma of uterus: Principal | ICD-10-CM | POA: Insufficient documentation

## 2021-04-14 DIAGNOSIS — Z90710 Acquired absence of both cervix and uterus: Secondary | ICD-10-CM | POA: Diagnosis present

## 2021-04-14 DIAGNOSIS — N393 Stress incontinence (female) (male): Secondary | ICD-10-CM | POA: Diagnosis not present

## 2021-04-14 DIAGNOSIS — Z87891 Personal history of nicotine dependence: Secondary | ICD-10-CM | POA: Insufficient documentation

## 2021-04-14 DIAGNOSIS — D219 Benign neoplasm of connective and other soft tissue, unspecified: Secondary | ICD-10-CM | POA: Diagnosis present

## 2021-04-14 DIAGNOSIS — D259 Leiomyoma of uterus, unspecified: Secondary | ICD-10-CM | POA: Diagnosis present

## 2021-04-14 HISTORY — PX: BLADDER SUSPENSION: SHX72

## 2021-04-14 HISTORY — PX: TOTAL LAPAROSCOPIC HYSTERECTOMY WITH SALPINGECTOMY: SHX6742

## 2021-04-14 HISTORY — PX: CYSTOSCOPY: SHX5120

## 2021-04-14 LAB — SARS CORONAVIRUS 2 (TAT 6-24 HRS): SARS Coronavirus 2: NEGATIVE

## 2021-04-14 LAB — POCT PREGNANCY, URINE: Preg Test, Ur: NEGATIVE

## 2021-04-14 LAB — ABO/RH: ABO/RH(D): O NEG

## 2021-04-14 SURGERY — HYSTERECTOMY, TOTAL, LAPAROSCOPIC, WITH SALPINGECTOMY
Anesthesia: General | Site: Bladder

## 2021-04-14 MED ORDER — STERILE WATER FOR IRRIGATION IR SOLN
Status: DC | PRN
  Administered 2021-04-14: 1000 mL via INTRAVESICAL

## 2021-04-14 MED ORDER — POVIDONE-IODINE 10 % EX SWAB: 2.0000 "application " | Freq: Once | CUTANEOUS | Status: DC

## 2021-04-14 MED ORDER — LACTATED RINGERS IV SOLN: INTRAVENOUS | Status: DC

## 2021-04-14 MED ORDER — KETOROLAC TROMETHAMINE 30 MG/ML IJ SOLN
30.0000 mg | Freq: Once | INTRAMUSCULAR | Status: AC
  Administered 2021-04-14: 30 mg via INTRAVENOUS

## 2021-04-14 MED ORDER — ONDANSETRON HCL 4 MG/2ML IJ SOLN
4.0000 mg | Freq: Four times a day (QID) | INTRAMUSCULAR | Status: DC | PRN
  Filled 2021-04-14: qty 2

## 2021-04-14 MED ORDER — ONDANSETRON HCL 4 MG/2ML IJ SOLN
INTRAMUSCULAR | Status: AC
  Filled 2021-04-14: qty 2

## 2021-04-14 MED ORDER — LIDOCAINE 2% (20 MG/ML) 5 ML SYRINGE
INTRAMUSCULAR | Status: AC
  Filled 2021-04-14: qty 5

## 2021-04-14 MED ORDER — BUPIVACAINE HCL (PF) 0.5 % IJ SOLN
INTRAMUSCULAR | Status: AC
  Filled 2021-04-14: qty 30

## 2021-04-14 MED ORDER — CEFAZOLIN IN SODIUM CHLORIDE 3-0.9 GM/100ML-% IV SOLN
3.0000 g | INTRAVENOUS | Status: AC
  Administered 2021-04-14 (×2): 3 g via INTRAVENOUS
  Filled 2021-04-14: qty 100

## 2021-04-14 MED ORDER — SUGAMMADEX SODIUM 500 MG/5ML IV SOLN
INTRAVENOUS | Status: AC
  Filled 2021-04-14: qty 5

## 2021-04-14 MED ORDER — SCOPOLAMINE 1 MG/3DAYS TD PT72
1.0000 | MEDICATED_PATCH | TRANSDERMAL | Status: DC
  Administered 2021-04-14: 1.5 mg via TRANSDERMAL
  Filled 2021-04-14: qty 1

## 2021-04-14 MED ORDER — SODIUM CHLORIDE 0.9% FLUSH: 9.0000 mL | INTRAVENOUS | Status: DC | PRN

## 2021-04-14 MED ORDER — ONDANSETRON HCL 4 MG PO TABS: 4.0000 mg | ORAL_TABLET | Freq: Four times a day (QID) | ORAL | Status: DC | PRN

## 2021-04-14 MED ORDER — OXYCODONE HCL 5 MG PO TABS
5.0000 mg | ORAL_TABLET | ORAL | Status: DC | PRN
  Administered 2021-04-15: 08:00:00 10 mg via ORAL
  Filled 2021-04-14: qty 2

## 2021-04-14 MED ORDER — VENLAFAXINE HCL ER 150 MG PO CP24
150.0000 mg | ORAL_CAPSULE | Freq: Every day | ORAL | Status: DC
  Filled 2021-04-14: qty 1

## 2021-04-14 MED ORDER — MIDAZOLAM HCL 2 MG/2ML IJ SOLN
INTRAMUSCULAR | Status: AC
  Filled 2021-04-14: qty 2

## 2021-04-14 MED ORDER — KETOROLAC TROMETHAMINE 30 MG/ML IJ SOLN: 30.0000 mg | Freq: Four times a day (QID) | INTRAMUSCULAR | Status: DC

## 2021-04-14 MED ORDER — ROCURONIUM BROMIDE 10 MG/ML (PF) SYRINGE
PREFILLED_SYRINGE | INTRAVENOUS | Status: AC
  Filled 2021-04-14: qty 10

## 2021-04-14 MED ORDER — IBUPROFEN 600 MG PO TABS: 600.0000 mg | ORAL_TABLET | Freq: Four times a day (QID) | ORAL | Status: DC

## 2021-04-14 MED ORDER — KETOROLAC TROMETHAMINE 30 MG/ML IJ SOLN
30.0000 mg | Freq: Four times a day (QID) | INTRAMUSCULAR | Status: DC
  Administered 2021-04-14 – 2021-04-15 (×2): 30 mg via INTRAVENOUS
  Filled 2021-04-14 (×2): qty 1

## 2021-04-14 MED ORDER — LIDOCAINE-EPINEPHRINE 1 %-1:100000 IJ SOLN
INTRAMUSCULAR | Status: DC | PRN
  Administered 2021-04-14: 16 mL

## 2021-04-14 MED ORDER — DEXAMETHASONE SODIUM PHOSPHATE 10 MG/ML IJ SOLN
INTRAMUSCULAR | Status: DC | PRN
  Administered 2021-04-14: 10 mg via INTRAVENOUS

## 2021-04-14 MED ORDER — NALOXONE HCL 0.4 MG/ML IJ SOLN: 0.4000 mg | INTRAMUSCULAR | Status: DC | PRN

## 2021-04-14 MED ORDER — ACETAMINOPHEN 500 MG PO TABS: 1000.0000 mg | ORAL_TABLET | Freq: Four times a day (QID) | ORAL | Status: DC

## 2021-04-14 MED ORDER — PROMETHAZINE HCL 25 MG/ML IJ SOLN: 6.2500 mg | INTRAMUSCULAR | Status: DC | PRN

## 2021-04-14 MED ORDER — FENTANYL CITRATE (PF) 250 MCG/5ML IJ SOLN
INTRAMUSCULAR | Status: DC | PRN
  Administered 2021-04-14: 50 ug via INTRAVENOUS
  Administered 2021-04-14: 25 ug via INTRAVENOUS
  Administered 2021-04-14: 100 ug via INTRAVENOUS
  Administered 2021-04-14: 50 ug via INTRAVENOUS
  Administered 2021-04-14: 25 ug via INTRAVENOUS

## 2021-04-14 MED ORDER — BUPIVACAINE HCL (PF) 0.25 % IJ SOLN
INTRAMUSCULAR | Status: DC | PRN
  Administered 2021-04-14: 27 mL

## 2021-04-14 MED ORDER — ACETAMINOPHEN 500 MG PO TABS
1000.0000 mg | ORAL_TABLET | Freq: Four times a day (QID) | ORAL | Status: DC
  Administered 2021-04-14 – 2021-04-15 (×3): 1000 mg via ORAL
  Filled 2021-04-14 (×4): qty 2

## 2021-04-14 MED ORDER — SODIUM CHLORIDE 0.9 % IR SOLN
Status: DC | PRN
  Administered 2021-04-14: 3000 mL
  Administered 2021-04-14: 1000 mL

## 2021-04-14 MED ORDER — SIMETHICONE 80 MG PO CHEW
80.0000 mg | CHEWABLE_TABLET | Freq: Four times a day (QID) | ORAL | Status: DC
  Administered 2021-04-14 – 2021-04-15 (×2): 80 mg via ORAL
  Filled 2021-04-14 (×2): qty 1

## 2021-04-14 MED ORDER — ONDANSETRON HCL 4 MG/2ML IJ SOLN
INTRAMUSCULAR | Status: DC | PRN
  Administered 2021-04-14: 4 mg via INTRAVENOUS

## 2021-04-14 MED ORDER — LIDOCAINE 2% (20 MG/ML) 5 ML SYRINGE
INTRAMUSCULAR | Status: DC | PRN
  Administered 2021-04-14: 100 mg via INTRAVENOUS

## 2021-04-14 MED ORDER — ALBUTEROL SULFATE (2.5 MG/3ML) 0.083% IN NEBU: 2.5000 mg | INHALATION_SOLUTION | Freq: Four times a day (QID) | RESPIRATORY_TRACT | Status: DC | PRN

## 2021-04-14 MED ORDER — KETOROLAC TROMETHAMINE 30 MG/ML IJ SOLN
INTRAMUSCULAR | Status: AC
  Filled 2021-04-14: qty 1

## 2021-04-14 MED ORDER — ONDANSETRON HCL 4 MG/2ML IJ SOLN
4.0000 mg | Freq: Four times a day (QID) | INTRAMUSCULAR | Status: DC | PRN
  Administered 2021-04-14: 4 mg via INTRAVENOUS

## 2021-04-14 MED ORDER — ACETAMINOPHEN 500 MG PO TABS
1000.0000 mg | ORAL_TABLET | Freq: Once | ORAL | Status: AC
  Administered 2021-04-14: 1000 mg via ORAL
  Filled 2021-04-14: qty 2

## 2021-04-14 MED ORDER — ESTRADIOL 0.1 MG/GM VA CREA
TOPICAL_CREAM | VAGINAL | Status: DC | PRN
  Administered 2021-04-14: 1 via VAGINAL

## 2021-04-14 MED ORDER — CHLORHEXIDINE GLUCONATE 0.12 % MT SOLN
15.0000 mL | Freq: Once | OROMUCOSAL | Status: AC
  Administered 2021-04-14: 15 mL via OROMUCOSAL
  Filled 2021-04-14: qty 15

## 2021-04-14 MED ORDER — ALPRAZOLAM 0.5 MG PO TABS
1.0000 mg | ORAL_TABLET | Freq: Three times a day (TID) | ORAL | Status: DC
  Administered 2021-04-14 – 2021-04-15 (×2): 1 mg via ORAL
  Filled 2021-04-14 (×2): qty 2

## 2021-04-14 MED ORDER — BUPIVACAINE HCL (PF) 0.25 % IJ SOLN
INTRAMUSCULAR | Status: AC
  Filled 2021-04-14: qty 30

## 2021-04-14 MED ORDER — PANTOPRAZOLE SODIUM 40 MG IV SOLR
40.0000 mg | Freq: Every day | INTRAVENOUS | Status: DC
  Administered 2021-04-14: 40 mg via INTRAVENOUS
  Filled 2021-04-14: qty 40

## 2021-04-14 MED ORDER — LIDOCAINE-EPINEPHRINE 1 %-1:100000 IJ SOLN
INTRAMUSCULAR | Status: AC
  Filled 2021-04-14: qty 1

## 2021-04-14 MED ORDER — ORAL CARE MOUTH RINSE: 15.0000 mL | Freq: Once | OROMUCOSAL | Status: AC

## 2021-04-14 MED ORDER — GABAPENTIN 100 MG PO CAPS
100.0000 mg | ORAL_CAPSULE | Freq: Three times a day (TID) | ORAL | Status: DC
  Administered 2021-04-14 – 2021-04-15 (×2): 100 mg via ORAL
  Filled 2021-04-14 (×2): qty 1

## 2021-04-14 MED ORDER — ROCURONIUM BROMIDE 10 MG/ML (PF) SYRINGE
PREFILLED_SYRINGE | INTRAVENOUS | Status: DC | PRN
  Administered 2021-04-14: 100 mg via INTRAVENOUS
  Administered 2021-04-14 (×2): 20 mg via INTRAVENOUS

## 2021-04-14 MED ORDER — PROPOFOL 500 MG/50ML IV EMUL
INTRAVENOUS | Status: DC | PRN
  Administered 2021-04-14: 20 ug/kg/min via INTRAVENOUS

## 2021-04-14 MED ORDER — CELECOXIB 200 MG PO CAPS
200.0000 mg | ORAL_CAPSULE | Freq: Once | ORAL | Status: DC
  Filled 2021-04-14: qty 1

## 2021-04-14 MED ORDER — ALBUMIN HUMAN 5 % IV SOLN: INTRAVENOUS | Status: DC | PRN

## 2021-04-14 MED ORDER — FENTANYL CITRATE (PF) 250 MCG/5ML IJ SOLN
INTRAMUSCULAR | Status: AC
  Filled 2021-04-14: qty 5

## 2021-04-14 MED ORDER — MENTHOL 3 MG MT LOZG: 1.0000 | LOZENGE | OROMUCOSAL | Status: DC | PRN

## 2021-04-14 MED ORDER — BUPIVACAINE HCL (PF) 0.5 % IJ SOLN
INTRAMUSCULAR | Status: DC | PRN
  Administered 2021-04-14: 30 mL

## 2021-04-14 MED ORDER — AMISULPRIDE (ANTIEMETIC) 5 MG/2ML IV SOLN: 10.0000 mg | Freq: Once | INTRAVENOUS | Status: DC | PRN

## 2021-04-14 MED ORDER — DIPHENHYDRAMINE HCL 50 MG/ML IJ SOLN: 12.5000 mg | Freq: Four times a day (QID) | INTRAMUSCULAR | Status: DC | PRN

## 2021-04-14 MED ORDER — SODIUM CHLORIDE (PF) 0.9 % IJ SOLN
INTRAMUSCULAR | Status: DC | PRN
  Administered 2021-04-14: 30 mL

## 2021-04-14 MED ORDER — PROPOFOL 10 MG/ML IV BOLUS
INTRAVENOUS | Status: DC | PRN
  Administered 2021-04-14: 180 mg via INTRAVENOUS

## 2021-04-14 MED ORDER — ESTRADIOL 0.1 MG/GM VA CREA
TOPICAL_CREAM | VAGINAL | Status: AC
  Filled 2021-04-14: qty 42.5

## 2021-04-14 MED ORDER — DEXAMETHASONE SODIUM PHOSPHATE 10 MG/ML IJ SOLN
INTRAMUSCULAR | Status: AC
  Filled 2021-04-14: qty 1

## 2021-04-14 MED ORDER — SUGAMMADEX SODIUM 200 MG/2ML IV SOLN
INTRAVENOUS | Status: DC | PRN
  Administered 2021-04-14: 250 mg via INTRAVENOUS

## 2021-04-14 MED ORDER — DIPHENHYDRAMINE HCL 12.5 MG/5ML PO ELIX: 12.5000 mg | ORAL_SOLUTION | Freq: Four times a day (QID) | ORAL | Status: DC | PRN

## 2021-04-14 MED ORDER — METHYLENE BLUE 0.5 % INJ SOLN
INTRAVENOUS | Status: DC | PRN
  Administered 2021-04-14: 5 mL via INTRAVENOUS

## 2021-04-14 MED ORDER — MIDAZOLAM HCL 5 MG/5ML IJ SOLN
INTRAMUSCULAR | Status: DC | PRN
  Administered 2021-04-14: 2 mg via INTRAVENOUS

## 2021-04-14 MED ORDER — HYDROMORPHONE 1 MG/ML IV SOLN
INTRAVENOUS | Status: DC
  Administered 2021-04-14: 0.8 mg via INTRAVENOUS
  Administered 2021-04-15 (×3): 0.3 mg via INTRAVENOUS
  Filled 2021-04-14: qty 30

## 2021-04-14 MED ORDER — LORATADINE 10 MG PO TABS
10.0000 mg | ORAL_TABLET | Freq: Every day | ORAL | Status: DC
  Administered 2021-04-15: 08:00:00 10 mg via ORAL
  Filled 2021-04-14: qty 1

## 2021-04-14 MED ORDER — FENTANYL CITRATE (PF) 100 MCG/2ML IJ SOLN: 25.0000 ug | INTRAMUSCULAR | Status: DC | PRN

## 2021-04-14 SURGICAL SUPPLY — 77 items
ADH SKN CLS APL DERMABOND .7 (GAUZE/BANDAGES/DRESSINGS) ×2
AGENT HMST KT MTR STRL THRMB (HEMOSTASIS)
BARRIER ADHS 3X4 INTERCEED (GAUZE/BANDAGES/DRESSINGS) ×4 IMPLANT
BLADE SURG 11 STRL SS (BLADE) ×4 IMPLANT
BRR ADH 4X3 ABS CNTRL BYND (GAUZE/BANDAGES/DRESSINGS) ×2
CABLE HIGH FREQUENCY MONO STRZ (ELECTRODE) ×4 IMPLANT
CANISTER SUCT 3000ML PPV (MISCELLANEOUS) ×4 IMPLANT
CATH FOLEY 2WAY SLVR 18FR 30CC (CATHETERS) ×6 IMPLANT
COVER MAYO STAND STRL (DRAPES) ×4 IMPLANT
DECANTER SPIKE VIAL GLASS SM (MISCELLANEOUS) ×4 IMPLANT
DERMABOND ADVANCED (GAUZE/BANDAGES/DRESSINGS) ×2
DERMABOND ADVANCED .7 DNX12 (GAUZE/BANDAGES/DRESSINGS) ×2 IMPLANT
DISSECTOR BLUNT TIP ENDO 5MM (MISCELLANEOUS) ×2 IMPLANT
DRSG COVADERM PLUS 2X2 (GAUZE/BANDAGES/DRESSINGS) ×4 IMPLANT
DURAPREP 26ML APPLICATOR (WOUND CARE) ×4 IMPLANT
FILTER SMOKE EVAC LAPAROSHD (FILTER) ×4 IMPLANT
GAUZE PACKING 2X5 YD STRL (GAUZE/BANDAGES/DRESSINGS) IMPLANT
GLOVE SURG ENC MOIS LTX SZ7 (GLOVE) ×4 IMPLANT
GLOVE SURG UNDER POLY LF SZ7 (GLOVE) ×16 IMPLANT
GOWN STRL REUS W/ TWL LRG LVL3 (GOWN DISPOSABLE) ×6 IMPLANT
GOWN STRL REUS W/TWL LRG LVL3 (GOWN DISPOSABLE) ×12
HIBICLENS CHG 4% 4OZ BTL (MISCELLANEOUS) ×4 IMPLANT
IRRIGATION STRYKERFLOW (MISCELLANEOUS) ×2 IMPLANT
IRRIGATOR STRYKERFLOW (MISCELLANEOUS) ×4
KIT TURNOVER KIT B (KITS) ×4 IMPLANT
LIGASURE VESSEL 5MM BLUNT TIP (ELECTROSURGICAL) ×4 IMPLANT
MANIPULATOR ADVINCU DEL 3.0 PL (MISCELLANEOUS) IMPLANT
MANIPULATOR ADVINCU DEL 3.5 PL (MISCELLANEOUS) IMPLANT
MANIPULATOR ADVINCU DEL 4.0 PL (MISCELLANEOUS) ×2 IMPLANT
NDL HYPO 25X1 1.5 SAFETY (NEEDLE) IMPLANT
NDL SPNL 18GX3.5 QUINCKE PK (NEEDLE) IMPLANT
NEEDLE HYPO 22GX1.5 SAFETY (NEEDLE) ×4 IMPLANT
NEEDLE HYPO 25X1 1.5 SAFETY (NEEDLE) ×4 IMPLANT
NEEDLE SPNL 18GX3.5 QUINCKE PK (NEEDLE) ×4 IMPLANT
NS IRRIG 1000ML POUR BTL (IV SOLUTION) ×4 IMPLANT
PACK LAPAROSCOPY BASIN (CUSTOM PROCEDURE TRAY) ×4 IMPLANT
PACK TRENDGUARD 450 HYBRID PRO (MISCELLANEOUS) IMPLANT
PACK VAGINAL WOMENS (CUSTOM PROCEDURE TRAY) ×4 IMPLANT
PROTECTOR NERVE ULNAR (MISCELLANEOUS) ×8 IMPLANT
SCISSORS LAP 5X35 DISP (ENDOMECHANICALS) ×4 IMPLANT
SET CYSTO W/LG BORE CLAMP LF (SET/KITS/TRAYS/PACK) ×4 IMPLANT
SET TRI-LUMEN FLTR TB AIRSEAL (TUBING) IMPLANT
SET TUBE SMOKE EVAC HIGH FLOW (TUBING) ×4 IMPLANT
SLEEVE ENDOPATH XCEL 5M (ENDOMECHANICALS) ×4 IMPLANT
SLING TVT EXACT (Sling) ×2 IMPLANT
SPONGE T-LAP 18X18 ~~LOC~~+RFID (SPONGE) ×2 IMPLANT
SURGIFLO W/THROMBIN 8M KIT (HEMOSTASIS) IMPLANT
SUT MNCRL AB 3-0 PS2 27 (SUTURE) ×6 IMPLANT
SUT MNCRL AB 4-0 PS2 18 (SUTURE) ×10 IMPLANT
SUT PL GUT 3 0 FS 1 (SUTURE) ×2 IMPLANT
SUT VIC AB 0 CT1 18XCR BRD8 (SUTURE) IMPLANT
SUT VIC AB 0 CT1 27 (SUTURE) ×20
SUT VIC AB 0 CT1 27XBRD ANBCTR (SUTURE) IMPLANT
SUT VIC AB 0 CT1 36 (SUTURE) ×4 IMPLANT
SUT VIC AB 0 CT1 8-18 (SUTURE)
SUT VIC AB 2-0 CT1 (SUTURE) IMPLANT
SUT VIC AB 2-0 UR6 27 (SUTURE) IMPLANT
SUT VICRYL 0 UR6 27IN ABS (SUTURE) ×4 IMPLANT
SUT VLOC 180 0 9IN  GS21 (SUTURE)
SUT VLOC 180 0 9IN GS21 (SUTURE) IMPLANT
SYR 10ML LL (SYRINGE) IMPLANT
SYR 20ML LL LF (SYRINGE) ×2 IMPLANT
SYR 50ML LL SCALE MARK (SYRINGE) IMPLANT
SYR BULB IRRIG 60ML STRL (SYRINGE) ×4 IMPLANT
TOWEL GREEN STERILE FF (TOWEL DISPOSABLE) ×12 IMPLANT
TRAY FOLEY W/BAG SLVR 14FR (SET/KITS/TRAYS/PACK) ×4 IMPLANT
TRENDGUARD 450 HYBRID PRO PACK (MISCELLANEOUS)
TROCAR 5M 150ML BLDLS (TROCAR) ×4 IMPLANT
TROCAR PORT AIRSEAL 8X100 (TROCAR) ×2 IMPLANT
TROCAR PORT AIRSEAL 8X120 (TROCAR) IMPLANT
TROCAR XCEL DIL TIP R 11M (ENDOMECHANICALS) ×2 IMPLANT
TROCAR XCEL NON-BLD 11X100MML (ENDOMECHANICALS) ×6 IMPLANT
TROCAR XCEL NON-BLD 5MMX100MML (ENDOMECHANICALS) ×4 IMPLANT
TUBE SUCT ARGYLE STRL (TUBING) ×2 IMPLANT
UNDERPAD 30X36 HEAVY ABSORB (UNDERPADS AND DIAPERS) ×4 IMPLANT
WARMER LAPAROSCOPE (MISCELLANEOUS) ×4 IMPLANT
YANKAUER SUCT BULB TIP NO VENT (SUCTIONS) ×2 IMPLANT

## 2021-04-14 NOTE — Transfer of Care (Signed)
Immediate Anesthesia Transfer of Care Note  Patient: Anna Keller  Procedure(s) Performed: TOTAL LAPAROSCOPIC HYSTERECTOMY WITH BILATERAL SALPINGECTOMY AND BILATERAL OOPHERECTOMY (Bilateral: Abdomen) TRANSVAGINAL TAPE (TVT) PROCEDURE (Bladder) CYSTOSCOPY (Bladder)  Patient Location: PACU  Anesthesia Type:General  Level of Consciousness: awake, drowsy, patient cooperative and responds to stimulation  Airway & Oxygen Therapy: Patient Spontanous Breathing and Patient connected to nasal cannula oxygen  Post-op Assessment: Report given to RN, Post -op Vital signs reviewed and stable, Patient moving all extremities X 4 and Patient able to stick tongue midline  Post vital signs: stable  Last Vitals:  Vitals Value Taken Time  BP 116/73 04/14/21 1228  Temp 97.9   Pulse 78 04/14/21 1230  Resp 26 04/14/21 1230  SpO2 94 % 04/14/21 1230  Vitals shown include unvalidated device data.  Last Pain:  Vitals:   04/14/21 0618  TempSrc:   PainSc: 0-No pain         Complications: No notable events documented.

## 2021-04-14 NOTE — Op Note (Signed)
OPERATIVE NOTE  Anna Keller  DOB:    1970-05-23  MRN:    [id number]  CSN:    [id number]  Date of Surgery:  04/14/2021  Preoperative Diagnosis: Large fibroid uterus Abnormal uterine bleeding Urinary stress incontinence  Postoperative Diagnosis: Same as above  Procedure(s): Total Laparoscopic hysterectomy Bilateral salpingoopherectomy Tension Free Vaginal Tape; Cystoscopy  Surgeon: Mady Haagensen. Alwyn Pea, M.D.  Assistant: Waymon Amato, M.D.  Anesthetic: General  ETA  EBL: 620 cc  IVF:  1700 mL LR one unit of Albumin  UOP:  200 mL Clear urine ( Foley)  Specimen:  Uterus, cervix, bilateral tubes and ovaries  Complications:  None  Disposition: The patient presents with the above-mentioned diagnosis. She understands the indications for surgical procedure.  She also understands the alternative treatment options. She accepts the risk of, but not limited to, anesthetic complications, bleeding, infections, and possible damage to the surrounding organs.  Indication: Patient with long history of heavy vaginal bleeding, endometrial biopsy negative for hyperplasia or malignancy. Ultrasound from office positive for multiple uterine fibroids  Findings: Exam under anesthesia: External vulva normal vaginal vault: normal cervix: parous, grossly normal. Uterus enlarged 16 weeks size palpable fibroids, mobile  Laparoscopic findings: multiple anterior and posterior uterine fibroids, normal appearing fallopian tubes and ovaries bilaterally.  Cystoscopy: Brisk ureteral jets bilaterally no injury to the urothelium  Procedure: The patient was brought into the operating room with an intravenous line in placed, and anesthetic was administered. She was placed in a low dorsal lithotomy position using Allen stirrups. The patient's  abdomen was prepped with ChloraPrep. The perineum and vagina were prepped with multiple layers of chlorhexidine the bladder catheterized with a foley.    A Time out was  performed confirming the patient name and procedure. A weighted speculum was placed in the vagina and a Deaver retractor anteriorly. The anterior lip of the cervix was held with a single tooth tenaculum. The uterus was sounded to 9cm  and dilated with [facility] dilators. The tenaculum was removed and the anterior lip of the cervix held with a stitch of 0-vicryl. 3.5 cm Advincula uterine manipulator was placed and the intrauterine balloon inflated. The  Speculum was removed. Surgeons gloves and gown were changed and attention turned to the abdomen.   The patient was sterilely draped. The subumbilical area was injected with 0.25 percent Marcaine.  An incision was made in the umbilicus with 11 blade scalpel.  A 17mm Excel Visiport trocar was used with l64mm 0 degree laparoscope attached to perform direct entry into the patient's abdomen. Direct video visualization confirmed entry into the abdomen and pneumoperitoneum was obtained using approximately 3L CO2 gas. The patient was placed in steep Trendelenburg position. After infiltration with Marcaine, a small incision was made and a 8 mm air-seal trocar was inserted into the abdominal cavity along the left pelvis under direct visualization.  Another 34mm  trocar was placed along the mid left abdomen. A third 32mm trocar was placed along the right lower pelvis. There was no injury noted with placement of trocars. The pelvic contents were visualized and findings noted above, both ureters were identified crossing the pelvic brim and pelvic sidewall coursing well below operative field.  Pictures were taken of the patient's pelvic structures.    The left infundibulopelvic ligament was identified and the left ureter was identified coursing well below the operative field. Close to the ovarian attachment, the left IP was cauterized and transected using the Ligasure.  The left round ligament was cauterized and  transected. The remaining left uteroovarian ligaments were cauterized and  cut. The bladder flap was developed by entering the anterior broad ligament.  The above was performed on the contralateral (right) side. The bladder flap was developed further and mobilized by the endoshears attached to monopolar cautery. Once the [facility] colpotomy ring was skeletonized and in position, the uterine arteries were sealed and transected using the 6mm Ligasure at the level of the colpotomy ring.  The uterine arteries were coagulated and transected bilaterally.   The vagina was transected using the monopolar endoshears resulting in separation of the uterus.  The uterine manipulator came off the uterus, therefore the uterus , cervix, tubes and ovaries were left in the abdomen. The uterine manipulator was removed and the vaginal occluder was insufflated with 60 cc of water. There was brisk bleeding from several vaginal accessory arteries that were cauterized using the Ligasure.  The left mid 40mm trocar was removed, extended and an 11 mm trocar placed so that the large suction irrigator could be used to remove large blood clots.   The CO2 was turned off and the abdomen was covered with a sterile drape and attention was turned to the vaginal portion of the procedure.  The uterus, cervix, fallopian tubes and ovaries were removed through the vagina.  A modified McCall's colposuspension was performed by incorporating the uterosacral ligaments at the angles of the vaginal cuff. The remaining cuff was closed using running suture of 0-vicryl.  The vault was closed by placing a finger in the vagina and there were no notable defects. Irrigation was performed and the vaginal cuff and the pedicles were noted to be hemostatic.      A cystoscopy was then performed using a 70 degree cystoscopy and a complete bladder survey was performed. There was no noted injury to the bladder urothelium and there were brisk jets from both ureteral orifices.   The mid-urethral sling portion was then performed:  The 5 French Foley  catheter was already in place. The exit points along the mons were marked 2 cm on each side of the midline immediately above the pubic symphysis. 30 cc of 0.5 % Marcaine was in 30 cc Normal saline. 30 cc of this local anesthesia was injected using a spinal needle into each exit site and passing the needle along the back of the pubic symphysis until the needle touched the endopelvic fascia.   1% lidocaine with epinephrine was injected submucosally at the level of the mid urethra, creating a space between the vaginal wall and the periurethral fascia. A sagittal incision no more than 1.5 cm long starting at 1.0 cm cephalad from the urethral meatus was performed using a 15 blade scalpel. Two 0.5 cm paraurethral lateral dissections were performed using Metzenbaum scissors to allow the subsequent passage of the implant.   The bladder was confirmed to be empty and the GYNECARE TVT reusable rigid catheter guide was inserted into the channel of the 18 French Foley catheter. The tip of foley catheter was pushed gently toward the posterior lateral wall of the bladder opposite to the intended trocar sheath passage in order to displace the bladder to the contralateral side and prevent injury.  The trocar sheath was secured to the Trocar Handle by hooking the trocar Sheath Cut-out onto the trocar sheath Lock on the Trocar Handle.  The trocar shaft was placed inside one of the two white trocar sheaths, using the dominant hand to hold the trocar handle and the  non-dominant hand to control  initial insertion of the device by placing index finger under the anterior vaginal wall, just lateral of the suburethral incision. The trocar sheath tip was oriented horizontally in the frontal plane during the initial submucosal passage in the paraurethral dissected space and the tip of the trocar sheath passed through until it reached the end of the dissected space.  The urogenital diaphragm was perforated and the trocar handle lowered to  ensure that the trocar sheath tip passed vertically, staying in close contact with the back of the pubic symphysis. The trocar sheath tip was advance through the urogenital diaphragm into the retropubic space and aimed towards the pre-marked abdominal (mons) exit sites.  The tip was advanced through the retropubic space, the rectus muscle and skin and then grasped with a Kelly clamp. The trocar Sheath was pushed laterally off the lock and the needle / shaft was withdrawn. The 18 french foley catheter was removed and a cystoscopy was performed to confirm bladder integrity.   The above procedure was repeated on the contralateral side. The trocar Sheaths were gently pulled upwards to bring the sling loosely under the mid urethra. The sling was carefully positioned into place. Care was taken to insure that the sling was not twisted in any way, and the sheaths were removed.  Mayo scissors were placed between the urethra and the sling itself during tensioning of the sling to insure proper placement and that it was in fact tension free. The sling was then cut to the level of the skin, the skin edges were lifted up to ensure that mesh was subdermal and the skin was closed using Dermabond.  The vaginal mucosa was then closed using 3-0 Monocryl on a subcuticular stitch and excellent hemostasis was achieved. The vagina was packed with 1/2 inch packing soaked with Estrace.    Surgeons gloves and gown were changed and attention returned to the abdomen. CO2 gas was re-introduced and the vaginal cuff visualized to be hemostatic. The accessory ports were removed under direct laparoscopic guidance and  the pneumoperitoneum was released. The umbilical port was removed using laparoscopic guidance. The umbilical fascia was closed with an interrupted figure-of-eight stitch using 0 Vicryl on a UR-6.  The fascia of the 1 65mm left mid abdominal incision was also closed with 0-vicryl. Several subcuticular stitches were placed of 2-0  plan.The skin was closed with subcuticular stitches using 4-0 Monocryl suture. Dermabond was used to cover each incision site and op sites placed.  The final sponge, needle, and instrument counts were correct x 3 at the completion of the procedure. The patient tolerated the procedure well and was awakened and taken to the post anesthesia care unit in stable condition.     Rogelio Seen Bobby Ragan

## 2021-04-14 NOTE — Progress Notes (Signed)
Voiding trial: pt had a urine output of 400cc and bladder scan of 0 ml.

## 2021-04-14 NOTE — Progress Notes (Signed)
MD GYN HISTORY AND PHYSICAL  Admission Date: 04/14/2021  5:39 AM  Admit Diagnosis: large fibroid uterus; urinary stress incontinence Patient Name: Anna Keller        MRN#: [id number]  Subjective:    Patient is a 50 y.o. female No obstetric history on file. scheduled for LAVH  possible TAH/ BSO / TVT / Cystoscopy. Indications for procedure are abnormal uterine bleeding, bulk symptoms, stress incontinence.   Pertinent Gynecological History: Menses: excessive  Bleeding: dysfunctional uterine bleeding Contraception: none DES exposure: denies Blood transfusions: none Sexually transmitted diseases: none Previous GYN Procedures:  none   Last mammogram: normal Date: 2022 Last pap: normal Date: 01/2021 OB History: G2, P2   Menstrual History: Menarche age: 1 LMP@   Medical / Surgical History: Past medical history:  Past Medical History:  Diagnosis Date   Ankylosing spondylitis ([place])    Anxiety    Arthritis    Depression    Eczema    Headache    Rheumatoid arthritis ([place])     Past surgical history:  Past Surgical History:  Procedure Laterality Date   TONSILLECTOMY     as a child   Family History:  Family History  Problem Relation Age of Onset   Hypertension Mother    Arthritis Father    Heart disease Sister    Pulmonary Hypertension Sister    COPD Sister    Diabetes Sister    Breast cancer Maternal Grandmother    Heart attack Paternal Grandmother    Breast cancer Maternal Aunt    Breast cancer Cousin     Social History:  reports that she quit smoking about 5 years ago. Her smoking use included cigarettes. She has a 25.00 pack-year smoking history. She has never used smokeless tobacco. She reports that she does not currently use alcohol. She reports that she does not use drugs.  Allergies: No Known Allergies   Current Medications at time of admission:  Prior to Admission medications   Medication Sig Start Date End Date Taking? Authorizing Provider   albuterol (VENTOLIN HFA) 108 (90 Base) MCG/ACT inhaler Inhale 2 puffs into the lungs every 6 (six) hours as needed for wheezing or shortness of breath. 03/21/21  Yes Marney Setting, NP  ALPRAZolam Duanne Moron) 1 MG tablet Take 1 mg by mouth 3 (three) times daily.   Yes [provider]  celecoxib (CELEBREX) 200 MG capsule Take 200 mg by mouth 2 (two) times daily.   Yes [provider]  cetirizine (ZYRTEC) 10 MG tablet Take 1 tablet (10 mg total) by mouth daily. 11/22/18  Yes Hubbard Hartshorn, FNP  Cholecalciferol (VITAMIN D3 PO) Take 1 capsule by mouth daily.   Yes [provider]  clobetasol cream (TEMOVATE) 0.17 % Apply 1 application topically daily.   Yes [provider]  Multiple Vitamins-Minerals (MULTIVITAMIN WITH MINERALS) tablet Take 1 tablet by mouth daily.   Yes [provider]  omeprazole (PRILOSEC) 20 MG capsule Take 1 capsule by mouth once daily 07/30/19  Yes Hubbard Hartshorn, FNP  polyethylene glycol powder (GLYCOLAX/MIRALAX) 17 GM/SCOOP powder Take 17 g by mouth daily. Patient taking differently: Take 17 g by mouth daily as needed for moderate constipation. 11/22/18  Yes Hubbard Hartshorn, FNP  venlafaxine XR (EFFEXOR-XR) 150 MG 24 hr capsule Take 150 mg by mouth daily. 01/15/21  Yes [provider]  phentermine (ADIPEX-P) 37.5 MG tablet Take 37.5 mg by mouth daily.    [provider]  predniSONE (STERAPRED UNI-PAK 21  TAB) 10 MG (21) TBPK tablet Take by mouth daily. Take 6 tabs by mouth daily  for 2 days, then 5 tabs for 2 days, then 4 tabs for 2 days, then 3 tabs for 2 days, 2 tabs for 2 days, then 1 tab by mouth daily for 2 days Patient not taking: Reported on 04/06/2021 03/21/21   Marney Setting, NP  promethazine-dextromethorphan (PROMETHAZINE-DM) 6.25-15 MG/5ML syrup Take 2.5 mLs by mouth 4 (four) times daily as needed for cough. Patient not taking: Reported on 04/06/2021 03/21/21   Marney Setting, NP    Review of  Systems: Constitutional: Negative   HENT: Negative   Eyes: Negative   Respiratory: Negative   Cardiovascular: Negative   Gastrointestinal: Negative  Genitourinary: positive for heavy vaginal bleeding   Musculoskeletal: Negative   Skin: Negative   Neurological: Negative   Endo/Heme/Allergies: Negative   Psychiatric/Behavioral: Negative      Objective:     Physical Exam: VS: Blood pressure (!) 154/85, pulse 82, temperature 98.5 F (36.9 C), temperature source Oral, resp. rate 18, height 5\' 6"  (1.676 m), weight 123.4 kg, last menstrual period 03/24/2021, SpO2 96 %. Physical Exam General:   alert and cooperative  Skin:   normal  HEENT:  PERRLA  Lungs:   clear to auscultation bilaterally  Heart:   regular rate and rhythm, S1, S2 normal, no murmur, click, rub or gallop  Breasts:     Abdomen:  soft, non-tender; bowel sounds normal; no masses,  no organomegaly  Pelvis:  Exam deferred.  Uterus: 16 cm  Adnexa:   Cervix:    Labs / Imaging: Results for orders placed or performed during the hospital encounter of 04/14/21 (from the past 24 hour(s))  Pregnancy, urine POC     Status: None   Collection Time: 04/14/21  6:32 AM  Result Value Ref Range   Preg Test, Ur NEGATIVE NEGATIVE  ABO/Rh     Status: None (Preliminary result)   Collection Time: 04/14/21  7:20 AM  Result Value Ref Range   ABO/RH(D) PENDING    No results found.    Assessment:        Patient with diagnosis of  Large fibroid uterus, stress incontinence        Plan:    I had a lengthy discussion with the patient regarding her diagnosis. She was counseled about the procedure, risks, reasons, benefits and complications to include: injury to bowel, bladder, major blood vessel, ureter, bleeding, possibility of transfusion, infection, abnormal scar formation and the possibility of diagnosis of malignancy requiring further medical or surgical treatment. All the above was reviewed in detail.  All inquiries made by patient  were answered.  Consent was signed, witnessed and placed into chart. Post op Instructions were reviewed, including office follow up.      Sanjuana Kava MD 04/14/2021, 7:42 AM

## 2021-04-14 NOTE — Anesthesia Procedure Notes (Signed)
Procedure Name: Intubation Date/Time: 04/14/2021 7:59 AM Performed by: Duane Boston, MD Pre-anesthesia Checklist: Patient identified, Emergency Drugs available, Suction available and Patient being monitored Patient Re-evaluated:Patient Re-evaluated prior to induction Oxygen Delivery Method: Circle system utilized Preoxygenation: Pre-oxygenation with 100% oxygen Induction Type: IV induction Ventilation: Mask ventilation without difficulty Laryngoscope Size: Miller and 2 Grade View: Grade I Tube type: Oral Tube size: 7.0 mm Number of attempts: 1 Placement Confirmation: ETT inserted through vocal cords under direct vision, positive ETCO2 and breath sounds checked- equal and bilateral Secured at: 21 cm Tube secured with: Tape Dental Injury: Teeth and Oropharynx as per pre-operative assessment  Comments: 1st attempt by CRNA reverse t-burg, Miller 2, Grade II view, excessive redundant tissue. 2nd attempt by Dr. Tobias Alexander, Sabra Heck 2, Grade I view w/BURP maneuver.

## 2021-04-14 NOTE — Anesthesia Postprocedure Evaluation (Signed)
Anesthesia Post Note  Patient: Anna Keller  Procedure(s) Performed: TOTAL LAPAROSCOPIC HYSTERECTOMY WITH BILATERAL SALPINGECTOMY AND BILATERAL OOPHERECTOMY (Bilateral: Abdomen) TRANSVAGINAL TAPE (TVT) PROCEDURE (Bladder) CYSTOSCOPY (Bladder)     Patient location during evaluation: PACU Anesthesia Type: General Level of consciousness: sedated Pain management: pain level controlled Vital Signs Assessment: post-procedure vital signs reviewed and stable Respiratory status: spontaneous breathing and respiratory function stable Cardiovascular status: stable Postop Assessment: no apparent nausea or vomiting Anesthetic complications: no   No notable events documented.  Last Vitals:  Vitals:   04/14/21 1345 04/14/21 1400  BP: 112/72 120/78  Pulse: 82 79  Resp:    Temp:    SpO2:      Last Pain:  Vitals:   04/14/21 1330  TempSrc:   PainSc: 0-No pain                 Anna Keller DANIEL

## 2021-04-15 ENCOUNTER — Encounter (HOSPITAL_COMMUNITY): Payer: Self-pay | Admitting: Obstetrics & Gynecology

## 2021-04-15 DIAGNOSIS — D251 Intramural leiomyoma of uterus: Secondary | ICD-10-CM | POA: Diagnosis not present

## 2021-04-15 MED ORDER — VENLAFAXINE HCL ER 150 MG PO CP24
150.0000 mg | ORAL_CAPSULE | Freq: Every day | ORAL | 6 refills | Status: DC
Start: 2021-04-15 — End: 2022-08-06

## 2021-04-15 MED ORDER — BETHANECHOL CHLORIDE 25 MG PO TABS: 25.0000 mg | ORAL_TABLET | Freq: Three times a day (TID) | ORAL | 3 refills | Status: DC | PRN

## 2021-04-15 MED ORDER — HYDROCODONE-ACETAMINOPHEN 5-325 MG PO TABS: 1.0000 | ORAL_TABLET | Freq: Four times a day (QID) | ORAL | 0 refills | Status: DC | PRN

## 2021-04-15 MED ORDER — DARIDOREXANT HCL 50 MG PO TABS: 1.0000 | ORAL_TABLET | Freq: Every evening | ORAL | 3 refills | Status: DC | PRN

## 2021-04-15 MED ORDER — NITROFURANTOIN MONOHYD MACRO 100 MG PO CAPS: 100.0000 mg | ORAL_CAPSULE | Freq: Two times a day (BID) | ORAL | 0 refills | Status: AC

## 2021-04-15 MED ORDER — METHOCARBAMOL 500 MG PO TABS: 500.0000 mg | ORAL_TABLET | Freq: Four times a day (QID) | ORAL | 3 refills | Status: DC | PRN

## 2021-04-15 NOTE — Discharge Summary (Addendum)
Physician Discharge Summary  Patient ID: Anna Keller MRN: [id number] DOB/AGE: 06-10-1970 50 y.o.  Admit date: 04/14/2021 Discharge date: 04/15/2021  Admission Diagnoses: Uterine Fibroids Abnormal uterine bleeding Urinary Stress incontinence Discharge Diagnoses:  Principal Problem:   Fibroids Active Problems:   S/P laparoscopic hysterectomy   Discharged Condition: good  Hospital Course: Patient taken to operating room where below procedures were performed.  There were no intraoperative or post operative complications.  Patient progressed well in the hospital and on POD#1 was meeting all discharge criteria and was discharged home.    Consults: None  Significant Diagnostic Studies: labs: pre op labs  Treatments: IV hydration, antibiotics: Ancef, analgesia: Dilaudid post op, and surgery: TLH/ BSO/TVT/ Cystoscopy  Discharge Exam: Blood pressure 103/61, pulse 79, temperature 98.1 F (36.7 C), temperature source Oral, resp. rate 17, height 5\' 6"  (1.676 m), weight 123.4 kg, last menstrual period 03/24/2021, SpO2 98 %. General appearance: alert, cooperative, and no distress GI: soft, appropriately tender, incisions well healed, well approximated Pelvic: deferred Extremities: extremities normal, atraumatic, no cyanosis or edema, Homans sign is negative, no sign of DVT, and no edema, redness or tenderness in the calves or thighs  Disposition: Discharge disposition: 01-Home or Self Care       Discharge Instructions     Call MD for:   Complete by: As directed    Abnormal foul smelling vaginal discharge or heavy vaginal bleeding (soaking a pad an hour)   Call MD for:  difficulty breathing, headache or visual disturbances   Complete by: As directed    Call MD for:  hives   Complete by: As directed    Call MD for:  persistant dizziness or light-headedness   Complete by: As directed    Call MD for:  persistant nausea and vomiting   Complete by: As directed    Call MD  for:  redness, tenderness, or signs of infection (pain, swelling, redness, odor or green/yellow discharge around incision site)   Complete by: As directed    Call MD for:  severe uncontrolled pain   Complete by: As directed    Call MD for:  temperature >100.4   Complete by: As directed    Diet - low sodium heart healthy   Complete by: As directed    Discharge instructions   Complete by: As directed    Follow up visit: 04/22/21 at 11:15 am with Dr. Alwyn Pea. Please call [place] office at [phone] or if you have any questions or concerns.  Call Dr. Alwyn Pea for any emergencies [phone]   Driving Restrictions   Complete by: As directed    No driving for 2 weeks or while taking percocet   If the dressing is still on your incision site when you go home, remove it on the third day after your surgery date. Remove dressing if it begins to fall off, or if it is dirty or damaged before the third day.   Complete by: As directed    Increase activity slowly   Complete by: As directed    Lifting restrictions   Complete by: As directed    Don't lift anything heavier than 20 pounds   Other Restrictions   Complete by: As directed    Showers only, no baths, pools, saunas   Sexual Activity Restrictions   Complete by: As directed    No intercourse or anything in vagina for 4-6 weeks or until seen at your 4 week post op visit.      Allergies as  of 04/15/2021   No Known Allergies      Medication List     TAKE these medications    albuterol 108 (90 Base) MCG/ACT inhaler Commonly known as: VENTOLIN HFA Inhale 2 puffs into the lungs every 6 (six) hours as needed for wheezing or shortness of breath.   ALPRAZolam 1 MG tablet Commonly known as: XANAX Take 1 mg by mouth 3 (three) times daily.   bethanechol 25 MG tablet Commonly known as: URECHOLINE Take 1 tablet (25 mg total) by mouth 3 (three) times daily as needed (bladder spasm).   celecoxib 200 MG capsule Commonly known as:  CELEBREX Take 200 mg by mouth 2 (two) times daily.   cetirizine 10 MG tablet Commonly known as: ZYRTEC Take 1 tablet (10 mg total) by mouth daily.   clobetasol cream 0.05 % Commonly known as: TEMOVATE Apply 1 application topically daily.   Daridorexant HCl 50 MG Tabs Take 1 tablet by mouth at bedtime as needed.   HYDROcodone-acetaminophen 5-325 MG tablet Commonly known as: NORCO/VICODIN Take 1 tablet by mouth every 6 (six) hours as needed for moderate pain.   methocarbamol 500 MG tablet Commonly known as: Robaxin Take 1 tablet (500 mg total) by mouth every 6 (six) hours as needed for muscle spasms.   multivitamin with minerals tablet Take 1 tablet by mouth daily.   nitrofurantoin (macrocrystal-monohydrate) 100 MG capsule Commonly known as: Macrobid Take 1 capsule (100 mg total) by mouth 2 (two) times daily for 7 days.   omeprazole 20 MG capsule Commonly known as: PRILOSEC Take 1 capsule by mouth once daily   phentermine 37.5 MG tablet Commonly known as: ADIPEX-P Take 37.5 mg by mouth daily.   polyethylene glycol powder 17 GM/SCOOP powder Commonly known as: GLYCOLAX/MIRALAX Take 17 g by mouth daily. What changed:  when to take this reasons to take this   predniSONE 10 MG (21) Tbpk tablet Commonly known as: STERAPRED UNI-PAK 21 TAB Take by mouth daily. Take 6 tabs by mouth daily  for 2 days, then 5 tabs for 2 days, then 4 tabs for 2 days, then 3 tabs for 2 days, 2 tabs for 2 days, then 1 tab by mouth daily for 2 days   promethazine-dextromethorphan 6.25-15 MG/5ML syrup Commonly known as: PROMETHAZINE-DM Take 2.5 mLs by mouth 4 (four) times daily as needed for cough.   venlafaxine XR 150 MG 24 hr capsule Commonly known as: EFFEXOR-XR Take 1 capsule (150 mg total) by mouth daily.   VITAMIN D3 PO Take 1 capsule by mouth daily.               Discharge Care Instructions  (From admission, onward)           Start     Ordered   04/15/21 0000  If the  dressing is still on your incision site when you go home, remove it on the third day after your surgery date. Remove dressing if it begins to fall off, or if it is dirty or damaged before the third day.        04/15/21 1021             FOLLOW UP VISIT: 04/22/21 at 11:15 am with Dr. Alwyn Pea - incision check  Signed: Sanjuana Kava 04/15/2021, 10:22 AM

## 2021-04-16 LAB — SURGICAL PATHOLOGY

## 2021-05-13 DIAGNOSIS — R350 Frequency of micturition: Secondary | ICD-10-CM | POA: Diagnosis not present

## 2021-05-29 DIAGNOSIS — N951 Menopausal and female climacteric states: Secondary | ICD-10-CM | POA: Diagnosis not present

## 2021-05-29 DIAGNOSIS — N3281 Overactive bladder: Secondary | ICD-10-CM | POA: Diagnosis not present

## 2021-05-29 DIAGNOSIS — N760 Acute vaginitis: Secondary | ICD-10-CM | POA: Diagnosis not present

## 2021-05-29 DIAGNOSIS — M069 Rheumatoid arthritis, unspecified: Secondary | ICD-10-CM | POA: Diagnosis not present

## 2021-05-29 DIAGNOSIS — E669 Obesity, unspecified: Secondary | ICD-10-CM | POA: Diagnosis not present

## 2021-05-29 DIAGNOSIS — N898 Other specified noninflammatory disorders of vagina: Secondary | ICD-10-CM | POA: Diagnosis not present

## 2021-06-24 DIAGNOSIS — R3915 Urgency of urination: Secondary | ICD-10-CM | POA: Diagnosis not present

## 2021-06-24 DIAGNOSIS — R69 Illness, unspecified: Secondary | ICD-10-CM | POA: Diagnosis not present

## 2021-06-24 DIAGNOSIS — N3281 Overactive bladder: Secondary | ICD-10-CM | POA: Diagnosis not present

## 2021-06-24 DIAGNOSIS — R102 Pelvic and perineal pain: Secondary | ICD-10-CM | POA: Diagnosis not present

## 2021-06-24 DIAGNOSIS — Z6837 Body mass index (BMI) 37.0-37.9, adult: Secondary | ICD-10-CM | POA: Diagnosis not present

## 2021-07-23 DIAGNOSIS — B3731 Acute candidiasis of vulva and vagina: Secondary | ICD-10-CM | POA: Diagnosis not present

## 2021-08-06 DIAGNOSIS — E785 Hyperlipidemia, unspecified: Secondary | ICD-10-CM | POA: Diagnosis not present

## 2021-08-06 DIAGNOSIS — R7301 Impaired fasting glucose: Secondary | ICD-10-CM | POA: Diagnosis not present

## 2021-08-06 DIAGNOSIS — R5383 Other fatigue: Secondary | ICD-10-CM | POA: Diagnosis not present

## 2021-08-11 DIAGNOSIS — R69 Illness, unspecified: Secondary | ICD-10-CM | POA: Diagnosis not present

## 2021-08-11 DIAGNOSIS — E785 Hyperlipidemia, unspecified: Secondary | ICD-10-CM | POA: Diagnosis not present

## 2021-08-11 DIAGNOSIS — R7301 Impaired fasting glucose: Secondary | ICD-10-CM | POA: Diagnosis not present

## 2021-08-11 DIAGNOSIS — E6609 Other obesity due to excess calories: Secondary | ICD-10-CM | POA: Diagnosis not present

## 2021-08-11 DIAGNOSIS — M069 Rheumatoid arthritis, unspecified: Secondary | ICD-10-CM | POA: Diagnosis not present

## 2021-08-11 DIAGNOSIS — R5383 Other fatigue: Secondary | ICD-10-CM | POA: Diagnosis not present

## 2021-08-11 DIAGNOSIS — E782 Mixed hyperlipidemia: Secondary | ICD-10-CM | POA: Diagnosis not present

## 2021-08-11 DIAGNOSIS — M1990 Unspecified osteoarthritis, unspecified site: Secondary | ICD-10-CM | POA: Diagnosis not present

## 2021-08-25 DIAGNOSIS — M0609 Rheumatoid arthritis without rheumatoid factor, multiple sites: Secondary | ICD-10-CM | POA: Diagnosis not present

## 2021-09-01 DIAGNOSIS — F411 Generalized anxiety disorder: Secondary | ICD-10-CM | POA: Diagnosis not present

## 2021-09-01 DIAGNOSIS — R69 Illness, unspecified: Secondary | ICD-10-CM | POA: Diagnosis not present

## 2021-09-25 DIAGNOSIS — R69 Illness, unspecified: Secondary | ICD-10-CM | POA: Diagnosis not present

## 2021-09-25 DIAGNOSIS — F411 Generalized anxiety disorder: Secondary | ICD-10-CM | POA: Diagnosis not present

## 2021-09-30 DIAGNOSIS — R300 Dysuria: Secondary | ICD-10-CM | POA: Diagnosis not present

## 2021-10-05 DIAGNOSIS — N760 Acute vaginitis: Secondary | ICD-10-CM | POA: Diagnosis not present

## 2021-10-05 DIAGNOSIS — E6609 Other obesity due to excess calories: Secondary | ICD-10-CM | POA: Diagnosis not present

## 2021-10-05 DIAGNOSIS — R69 Illness, unspecified: Secondary | ICD-10-CM | POA: Diagnosis not present

## 2021-10-05 DIAGNOSIS — R87619 Unspecified abnormal cytological findings in specimens from cervix uteri: Secondary | ICD-10-CM | POA: Diagnosis not present

## 2021-10-23 DIAGNOSIS — F411 Generalized anxiety disorder: Secondary | ICD-10-CM | POA: Diagnosis not present

## 2021-10-23 DIAGNOSIS — R69 Illness, unspecified: Secondary | ICD-10-CM | POA: Diagnosis not present

## 2021-10-28 ENCOUNTER — Ambulatory Visit (INDEPENDENT_AMBULATORY_CARE_PROVIDER_SITE_OTHER): Payer: 59

## 2021-10-28 ENCOUNTER — Encounter: Payer: Self-pay | Admitting: Orthopaedic Surgery

## 2021-10-28 ENCOUNTER — Ambulatory Visit (INDEPENDENT_AMBULATORY_CARE_PROVIDER_SITE_OTHER): Payer: 59 | Admitting: Orthopaedic Surgery

## 2021-10-28 ENCOUNTER — Ambulatory Visit: Payer: Self-pay

## 2021-10-28 ENCOUNTER — Telehealth: Payer: Self-pay

## 2021-10-28 VITALS — Ht 66.0 in | Wt 278.0 lb

## 2021-10-28 DIAGNOSIS — M25561 Pain in right knee: Secondary | ICD-10-CM

## 2021-10-28 DIAGNOSIS — G8929 Other chronic pain: Secondary | ICD-10-CM | POA: Diagnosis not present

## 2021-10-28 DIAGNOSIS — M25511 Pain in right shoulder: Secondary | ICD-10-CM

## 2021-10-28 DIAGNOSIS — M25562 Pain in left knee: Secondary | ICD-10-CM | POA: Diagnosis not present

## 2021-10-28 NOTE — Telephone Encounter (Signed)
Bilateral knee gel injections 

## 2021-10-28 NOTE — Progress Notes (Signed)
The patient is a 51 year old female that I am seeing for the first time.  She has chronic bilateral shoulder pain with the right worse than left and chronic bilateral knee pain.  She was seen by orthopedics in [facility] for many years but and can no longer see them due to some type of insurance changes.  She showed me a single image of her right shoulder that was from an MRI done showing a fully retracted rotator cuff tear.  She said it was recommended she have some type of soft tissue surgery on the shoulder with some type of "patch" due to the retracted nature of rotator cuff.  She lives in chronic pain is far as the shoulder goes.  Her left shoulder also hurts with both her knees hurt.  She has had knee pain for many years in both knees.  She was eventually diagnosed with rheumatoid arthritis and sees a rheumatologist.  It sounds like she has been getting infusions for this.  She has never had hyaluronic acid injection for her knees but that has been recommended at this point.  She does have a BMI of 44.87.  She denies being a diabetic.  She takes Celebrex for inflammation and pain but it is not helping her.  On exam both knees show no effusion.  Both knees hyperextend and have patellofemoral crepitation with global tenderness.  Both knees have slight varus malalignment that is minimal.  Her right shoulder does show pain with any attempts of exam.  It is well located but does show weakness in the rotator cuff.  From my standpoint I would like to order hyaluronic acid for both her knees to treat the pain from her osteoarthritis combined with rheumatoid arthritis.  I would also like to send her to one of my partners to assess her right shoulder.  I have recommended that she obtain the MRI images of her right shoulder.  They would likely can be seen on the canopy system as well.  We will be in touch with her once we have her hopefully approved for hyaluronic acid for her knees.

## 2021-10-29 NOTE — Telephone Encounter (Signed)
Noted  

## 2021-11-02 ENCOUNTER — Ambulatory Visit (HOSPITAL_BASED_OUTPATIENT_CLINIC_OR_DEPARTMENT_OTHER): Payer: 59 | Admitting: Orthopaedic Surgery

## 2021-11-02 DIAGNOSIS — S46011A Strain of muscle(s) and tendon(s) of the rotator cuff of right shoulder, initial encounter: Secondary | ICD-10-CM

## 2021-11-02 NOTE — Progress Notes (Signed)
Chief Complaint: Right shoulder pain     History of Present Illness:    Anna Keller is a 51 y.o. female right-hand-dominant presents with right shoulder pain which has been going on since March 18, 2021.  She states that she felt an audible pop at the time when she was loading close into her dryer and since that time has had severe pain.  She is extremely limited with regard to the right arm.  She is not able to lift the shoulder with any type of strength.  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  She states that she has young grandchildren at home and is unfortunately not able to lift them up.  She does have a known history of rheumatoid arthritis for which she gets infusions.  With regard to the right shoulder she has previously been seen at [facility] and told that she needed a rotator cuff repair with patch augmentation.  She is on Celebrex for her rheumatoid.  At today's visit she is here for the right shoulder and significant pain.  She is quite frustrated with overall the quality of the shoulder.    Surgical History:   None  PMH/PSH/Family History/Social History/Meds/Allergies:    Past Medical History:  Diagnosis Date   Ankylosing spondylitis ([place])    Anxiety    Arthritis    Depression    Eczema    Headache    Rheumatoid arthritis ([place])    Past Surgical History:  Procedure Laterality Date   BLADDER SUSPENSION N/A 04/14/2021   Procedure: TRANSVAGINAL TAPE (TVT) PROCEDURE;  Surgeon: Sanjuana Kava, MD;  Location: [facility];  Service: Gynecology;  Laterality: N/A;  GYNCARE TVT EXACT   CYSTOSCOPY N/A 04/14/2021   Procedure: CYSTOSCOPY;  Surgeon: Sanjuana Kava, MD;  Location: [facility];  Service: Gynecology;  Laterality: N/A;   TONSILLECTOMY     as a child   TOTAL LAPAROSCOPIC HYSTERECTOMY WITH SALPINGECTOMY Bilateral 04/14/2021   Procedure: TOTAL LAPAROSCOPIC HYSTERECTOMY WITH BILATERAL SALPINGECTOMY AND BILATERAL  OOPHERECTOMY;  Surgeon: Sanjuana Kava, MD;  Location: [facility];  Service: Gynecology;  Laterality: Bilateral;   Social History   Socioeconomic History   Marital status: Married    Spouse name: Legrand Como   Number of children: 1   Years of education: 12   Highest education level: High school graduate  Occupational History   Not on file  Tobacco Use   Smoking status: Former    Packs/day: 1.00    Years: 25.00    Total pack years: 25.00    Types: Cigarettes    Quit date: 01/09/2016    Years since quitting: 5.8   Smokeless tobacco: Never  Vaping Use   Vaping Use: Never used  Substance and Sexual Activity   Alcohol use: Not Currently   Drug use: No   Sexual activity: Yes    Partners: Male    Birth control/protection: None  Other Topics Concern   Not on file  Social History Narrative   Not on file   Social Determinants of Health   Financial Resource Strain: Low Risk  (07/14/2018)   Overall Financial Resource Strain (CARDIA)    Difficulty of Paying Living Expenses: Not hard at all  Food Insecurity: No Food Insecurity (07/14/2018)   Hunger Vital Sign    Worried About Running  Out of Food in the Last Year: Never true    Ran Out of Food in the Last Year: Never true  Transportation Needs: No Transportation Needs (07/14/2018)   PRAPARE - Hydrologist (Medical): No    Lack of Transportation (Non-Medical): No  Physical Activity: Inactive (07/14/2018)   Exercise Vital Sign    Days of Exercise per Week: 0 days    Minutes of Exercise per Session: 0 min  Stress: No Stress Concern Present (07/14/2018)   [place]    Feeling of Stress : Only a little  Social Connections: Somewhat Isolated (07/14/2018)   Social Connection and Isolation Panel [NHANES]    Frequency of Communication with Friends and Family: More than three times a week    Frequency of Social Gatherings with Friends and Family: More than  three times a week    Attends Religious Services: Never    Marine scientist or Organizations: No    Attends Music therapist: Never    Marital Status: Married   Family History  Problem Relation Age of Onset   Hypertension Mother    Arthritis Father    Heart disease Sister    Pulmonary Hypertension Sister    COPD Sister    Diabetes Sister    Breast cancer Maternal Grandmother    Heart attack Paternal Grandmother    Breast cancer Maternal Aunt    Breast cancer Cousin    No Known Allergies Current Outpatient Medications  Medication Sig Dispense Refill   albuterol (VENTOLIN HFA) 108 (90 Base) MCG/ACT inhaler Inhale 2 puffs into the lungs every 6 (six) hours as needed for wheezing or shortness of breath. 1 each 0   ALPRAZolam (XANAX) 1 MG tablet Take 1 mg by mouth 3 (three) times daily.     bethanechol (URECHOLINE) 25 MG tablet Take 1 tablet (25 mg total) by mouth 3 (three) times daily as needed (bladder spasm). 30 tablet 3   celecoxib (CELEBREX) 200 MG capsule Take 200 mg by mouth 2 (two) times daily.     cetirizine (ZYRTEC) 10 MG tablet Take 1 tablet (10 mg total) by mouth daily. 90 tablet 3   Cholecalciferol (VITAMIN D3 PO) Take 1 capsule by mouth daily.     clobetasol cream (TEMOVATE) 3.89 % Apply 1 application topically daily.     Daridorexant HCl 50 MG TABS Take 1 tablet by mouth at bedtime as needed. 30 tablet 3   HYDROcodone-acetaminophen (NORCO/VICODIN) 5-325 MG tablet Take 1 tablet by mouth every 6 (six) hours as needed for moderate pain. 30 tablet 0   methocarbamol (ROBAXIN) 500 MG tablet Take 1 tablet (500 mg total) by mouth every 6 (six) hours as needed for muscle spasms. 30 tablet 3   Multiple Vitamins-Minerals (MULTIVITAMIN WITH MINERALS) tablet Take 1 tablet by mouth daily.     omeprazole (PRILOSEC) 20 MG capsule Take 1 capsule by mouth once daily 90 capsule 0   phentermine (ADIPEX-P) 37.5 MG tablet Take 37.5 mg by mouth daily.     polyethylene glycol  powder (GLYCOLAX/MIRALAX) 17 GM/SCOOP powder Take 17 g by mouth daily. (Patient taking differently: Take 17 g by mouth daily as needed for moderate constipation.) 3350 g 3   predniSONE (STERAPRED UNI-PAK 21 TAB) 10 MG (21) TBPK tablet Take by mouth daily. Take 6 tabs by mouth daily  for 2 days, then 5 tabs for 2 days, then 4 tabs for 2 days, then  3 tabs for 2 days, 2 tabs for 2 days, then 1 tab by mouth daily for 2 days (Patient not taking: Reported on 04/06/2021) 42 tablet 0   promethazine-dextromethorphan (PROMETHAZINE-DM) 6.25-15 MG/5ML syrup Take 2.5 mLs by mouth 4 (four) times daily as needed for cough. (Patient not taking: Reported on 04/06/2021) 118 mL 0   venlafaxine XR (EFFEXOR-XR) 150 MG 24 hr capsule Take 1 capsule (150 mg total) by mouth daily. 60 capsule 6   No current facility-administered medications for this visit.   No results found.  Review of Systems:   A ROS was performed including pertinent positives and negatives as documented in the HPI.  Physical Exam :   Constitutional: NAD and appears stated age Neurological: Alert and oriented Psych: Appropriate affect and cooperative There were no vitals taken for this visit.   Comprehensive Musculoskeletal Exam:    Musculoskeletal Exam    Inspection Right Left  Skin No atrophy or winging No atrophy or winging  Palpation    Tenderness Biceps, anterior lateral shoulder None  Range of Motion    Flexion (passive) 120 150  Flexion (active) 80 100  Abduction 80 100  ER at the side 0 40  Can reach behind back to Back pocket L3  Strength     4/5 supra and infraspinatus with pain, negative belly press Full with pain  Special Tests    Pseudoparalytic No No  Neurologic    Fires PIN, radial, median, ulnar, musculocutaneous, axillary, suprascapular, long thoracic, and spinal accessory innervated muscles. No abnormal sensibility  Vascular/Lymphatic    Radial Pulse 2+ 2+  Cervical Exam    Patient has symmetric cervical range of  motion with negative Spurling's test.  Special Test:      Imaging:   Xray (3 views right shoulder): Normal with maintained glenohumeral space   I personally reviewed and interpreted the radiographs.   Assessment:   51 y.o. female right-hand-dominant presents with right shoulder pain in the setting of an acute rotator cuff injury which she sustained when she was pulling laundry out of the dryer.  This happened over 8 months ago.  I did discuss that while I do believe that intervention is required in order to improve the quality of her shoulder, I am concerned about a significant amount of atrophy that could have happened in the 63-monthtimeframe between which she last saw an orthopedic provider.  Specifically if there has been a significant amount of atrophy in the rotator cuff tendon I do not believe that she would be a candidate for a superior capsular reconstruction type procedure and instead I would recommend reverse shoulder arthroplasty.  To that effect I do believe an updated MRI is required in order to make the best decision for her.  Specifically this does have implications given the fact that she does have rheumatoid arthritis.  Plan :    -Plan for MRI right shoulder and follow-up to discuss results  I believe that advance imaging in the form of an MRI is indicated for the following reasons: -Xrays images were obtained and not diagnostic -The patient has failed treatment modalities including rest, activity restriction, Celebrex -The following worrisome symptoms are present on history and exam: Acute injury requiring acute intervention - MRI is required to assist in specific surgical planning       I personally saw and evaluated the patient, and participated in the management and treatment plan.  SVanetta Mulders MD Attending Physician, Orthopedic Surgery  This document was dictated using  Dragon Armed forces training and education officer. A reasonable attempt at proof reading has been made  to minimize errors.

## 2021-11-16 ENCOUNTER — Encounter: Payer: Self-pay | Admitting: *Deleted

## 2021-11-20 DIAGNOSIS — F909 Attention-deficit hyperactivity disorder, unspecified type: Secondary | ICD-10-CM | POA: Diagnosis not present

## 2021-11-20 DIAGNOSIS — F411 Generalized anxiety disorder: Secondary | ICD-10-CM | POA: Diagnosis not present

## 2021-11-20 DIAGNOSIS — R69 Illness, unspecified: Secondary | ICD-10-CM | POA: Diagnosis not present

## 2021-11-23 ENCOUNTER — Ambulatory Visit
Admission: RE | Admit: 2021-11-23 | Discharge: 2021-11-23 | Disposition: A | Discharge status: Home / Self Care | Payer: 59 | Source: Ambulatory Visit | Attending: Orthopaedic Surgery | Admitting: Orthopaedic Surgery

## 2021-11-23 DIAGNOSIS — E785 Hyperlipidemia, unspecified: Secondary | ICD-10-CM | POA: Diagnosis not present

## 2021-11-23 DIAGNOSIS — R7301 Impaired fasting glucose: Secondary | ICD-10-CM | POA: Diagnosis not present

## 2021-11-23 DIAGNOSIS — S46011A Strain of muscle(s) and tendon(s) of the rotator cuff of right shoulder, initial encounter: Secondary | ICD-10-CM

## 2021-11-23 DIAGNOSIS — R5383 Other fatigue: Secondary | ICD-10-CM | POA: Diagnosis not present

## 2021-11-24 DIAGNOSIS — M2550 Pain in unspecified joint: Secondary | ICD-10-CM | POA: Diagnosis not present

## 2021-11-24 DIAGNOSIS — E782 Mixed hyperlipidemia: Secondary | ICD-10-CM | POA: Diagnosis not present

## 2021-11-24 DIAGNOSIS — R7303 Prediabetes: Secondary | ICD-10-CM | POA: Diagnosis not present

## 2021-11-24 DIAGNOSIS — M069 Rheumatoid arthritis, unspecified: Secondary | ICD-10-CM | POA: Diagnosis not present

## 2021-11-24 NOTE — Telephone Encounter (Signed)
Submited for VOB on [email or web address] 

## 2021-11-25 DIAGNOSIS — M25511 Pain in right shoulder: Secondary | ICD-10-CM | POA: Diagnosis not present

## 2021-11-26 ENCOUNTER — Telehealth: Payer: Self-pay | Admitting: Orthopaedic Surgery

## 2021-11-26 NOTE — Telephone Encounter (Signed)
Patient called in asking about 3 series Gel injection her and Ninfa Linden previously discussed on her visit last month, she was told he was going to put in the order for it but it has yet to be done, please advise Patient would like injection in both knees, they are bone on bone per Ninfa Linden

## 2021-11-26 NOTE — Telephone Encounter (Signed)
I called and let her know that the authorization was submitted today to her insurance

## 2021-11-27 ENCOUNTER — Encounter (HOSPITAL_BASED_OUTPATIENT_CLINIC_OR_DEPARTMENT_OTHER): Payer: Self-pay | Admitting: Radiology

## 2021-11-27 ENCOUNTER — Ambulatory Visit (HOSPITAL_BASED_OUTPATIENT_CLINIC_OR_DEPARTMENT_OTHER)
Admission: RE | Admit: 2021-11-27 | Discharge: 2021-11-27 | Disposition: A | Discharge status: Home / Self Care | Payer: 59 | Source: Ambulatory Visit | Attending: Diagnostic Radiology | Admitting: Diagnostic Radiology

## 2021-11-27 ENCOUNTER — Encounter (HOSPITAL_BASED_OUTPATIENT_CLINIC_OR_DEPARTMENT_OTHER): Payer: 59 | Admitting: Radiology

## 2021-11-27 DIAGNOSIS — Z1231 Encounter for screening mammogram for malignant neoplasm of breast: Secondary | ICD-10-CM

## 2021-11-30 ENCOUNTER — Other Ambulatory Visit (HOSPITAL_BASED_OUTPATIENT_CLINIC_OR_DEPARTMENT_OTHER): Payer: Self-pay

## 2021-11-30 ENCOUNTER — Other Ambulatory Visit: Payer: Self-pay

## 2021-11-30 ENCOUNTER — Ambulatory Visit (HOSPITAL_BASED_OUTPATIENT_CLINIC_OR_DEPARTMENT_OTHER): Payer: 59 | Admitting: Orthopaedic Surgery

## 2021-11-30 DIAGNOSIS — S46011A Strain of muscle(s) and tendon(s) of the rotator cuff of right shoulder, initial encounter: Secondary | ICD-10-CM

## 2021-11-30 DIAGNOSIS — G8929 Other chronic pain: Secondary | ICD-10-CM

## 2021-11-30 MED ORDER — OXYCODONE HCL 5 MG PO TABS
5.0000 mg | ORAL_TABLET | ORAL | 0 refills | Status: DC | PRN
Start: 2021-11-30 — End: 2022-01-07
  Filled 2021-11-30: qty 20, 4d supply, fill #0

## 2021-11-30 MED ORDER — ACETAMINOPHEN 500 MG PO TABS
500.0000 mg | ORAL_TABLET | Freq: Three times a day (TID) | ORAL | 0 refills | Status: AC
  Filled 2021-11-30: qty 30, 10d supply, fill #0

## 2021-11-30 NOTE — Progress Notes (Addendum)
Chief Complaint: Right shoulder pain     History of Present Illness:   11/30/2021: Presents today for follow-up of her right shoulder.  She is here today for MRI discussion.  Upon additional questioning she states that she did have 1 injection into the right shoulder previously over a year prior with limited relief.  She was given shoulder strengthening exercises to complete at the last visit but has had extremely limited overhead function.   Anna Keller is a 51 y.o. female right-hand-dominant presents with right shoulder pain which has been going on since March 18, 2021.  She states that she felt an audible pop at the time when she was loading cloths into her dryer and since that time has had severe pain.  She is extremely limited with regard to the right arm.  She is not able to lift the shoulder with any type of strength.  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  She states that she has young grandchildren at home and is unfortunately not able to lift them up.  She does have a known history of rheumatoid arthritis for which she receives IV infusions every 8 weeks.  With regard to the right shoulder she has previously been seen at [facility] and told that she needed a rotator cuff repair with patch augmentation.  She is on Celebrex for her rheumatoid.  At today's visit she is here for the right shoulder and significant pain.  She is quite frustrated with overall the quality of the shoulder.    Surgical History:   None  PMH/PSH/Family History/Social History/Meds/Allergies:    Past Medical History:  Diagnosis Date   Ankylosing spondylitis ([place])    Anxiety    Arthritis    Depression    Eczema    Headache    Rheumatoid arthritis ([place])    Past Surgical History:  Procedure Laterality Date   BLADDER SUSPENSION N/A 04/14/2021   Procedure: TRANSVAGINAL TAPE (TVT) PROCEDURE;  Surgeon: Sanjuana Kava, MD;  Location: [facility];  Service:  Gynecology;  Laterality: N/A;  GYNCARE TVT EXACT   CYSTOSCOPY N/A 04/14/2021   Procedure: CYSTOSCOPY;  Surgeon: Sanjuana Kava, MD;  Location: [facility];  Service: Gynecology;  Laterality: N/A;   TONSILLECTOMY     as a child   TOTAL LAPAROSCOPIC HYSTERECTOMY WITH SALPINGECTOMY Bilateral 04/14/2021   Procedure: TOTAL LAPAROSCOPIC HYSTERECTOMY WITH BILATERAL SALPINGECTOMY AND BILATERAL OOPHERECTOMY;  Surgeon: Sanjuana Kava, MD;  Location: [facility];  Service: Gynecology;  Laterality: Bilateral;   Social History   Socioeconomic History   Marital status: Married    Spouse name: Legrand Como   Number of children: 1   Years of education: 12   Highest education level: High school graduate  Occupational History   Not on file  Tobacco Use   Smoking status: Former    Packs/day: 1.00    Years: 25.00    Total pack years: 25.00    Types: Cigarettes    Quit date: 01/09/2016    Years since quitting: 5.8   Smokeless tobacco: Never  Vaping Use   Vaping Use: Never used  Substance and Sexual Activity   Alcohol use: Not Currently   Drug use: No   Sexual activity: Yes    Partners: Male    Birth control/protection: None  Other Topics Concern  Not on file  Social History Narrative   Not on file   Social Determinants of Health   Financial Resource Strain: Low Risk  (07/14/2018)   Overall Financial Resource Strain (CARDIA)    Difficulty of Paying Living Expenses: Not hard at all  Food Insecurity: No Food Insecurity (07/14/2018)   Hunger Vital Sign    Worried About Running Out of Food in the Last Year: Never true    Ran Out of Food in the Last Year: Never true  Transportation Needs: No Transportation Needs (07/14/2018)   PRAPARE - Hydrologist (Medical): No    Lack of Transportation (Non-Medical): No  Physical Activity: Inactive (07/14/2018)   Exercise Vital Sign    Days of Exercise per Week: 0 days    Minutes of Exercise per Session: 0 min  Stress: No Stress Concern Present  (07/14/2018)   [place]    Feeling of Stress : Only a little  Social Connections: Somewhat Isolated (07/14/2018)   Social Connection and Isolation Panel [NHANES]    Frequency of Communication with Friends and Family: More than three times a week    Frequency of Social Gatherings with Friends and Family: More than three times a week    Attends Religious Services: Never    Marine scientist or Organizations: No    Attends Music therapist: Never    Marital Status: Married   Family History  Problem Relation Age of Onset   Hypertension Mother    Arthritis Father    Heart disease Sister    Pulmonary Hypertension Sister    COPD Sister    Diabetes Sister    Breast cancer Maternal Grandmother    Heart attack Paternal Grandmother    Breast cancer Maternal Aunt    Breast cancer Cousin    No Known Allergies Current Outpatient Medications  Medication Sig Dispense Refill   albuterol (VENTOLIN HFA) 108 (90 Base) MCG/ACT inhaler Inhale 2 puffs into the lungs every 6 (six) hours as needed for wheezing or shortness of breath. 1 each 0   ALPRAZolam (XANAX) 1 MG tablet Take 1 mg by mouth 3 (three) times daily.     bethanechol (URECHOLINE) 25 MG tablet Take 1 tablet (25 mg total) by mouth 3 (three) times daily as needed (bladder spasm). 30 tablet 3   celecoxib (CELEBREX) 200 MG capsule Take 200 mg by mouth 2 (two) times daily.     cetirizine (ZYRTEC) 10 MG tablet Take 1 tablet (10 mg total) by mouth daily. 90 tablet 3   Cholecalciferol (VITAMIN D3 PO) Take 1 capsule by mouth daily.     clobetasol cream (TEMOVATE) 5.17 % Apply 1 application topically daily.     Daridorexant HCl 50 MG TABS Take 1 tablet by mouth at bedtime as needed. 30 tablet 3   HYDROcodone-acetaminophen (NORCO/VICODIN) 5-325 MG tablet Take 1 tablet by mouth every 6 (six) hours as needed for moderate pain. 30 tablet 0   methocarbamol (ROBAXIN) 500 MG  tablet Take 1 tablet (500 mg total) by mouth every 6 (six) hours as needed for muscle spasms. 30 tablet 3   Multiple Vitamins-Minerals (MULTIVITAMIN WITH MINERALS) tablet Take 1 tablet by mouth daily.     omeprazole (PRILOSEC) 20 MG capsule Take 1 capsule by mouth once daily 90 capsule 0   phentermine (ADIPEX-P) 37.5 MG tablet Take 37.5 mg by mouth daily.     polyethylene glycol powder (GLYCOLAX/MIRALAX) 17  GM/SCOOP powder Take 17 g by mouth daily. (Patient taking differently: Take 17 g by mouth daily as needed for moderate constipation.) 3350 g 3   predniSONE (STERAPRED UNI-PAK 21 TAB) 10 MG (21) TBPK tablet Take by mouth daily. Take 6 tabs by mouth daily  for 2 days, then 5 tabs for 2 days, then 4 tabs for 2 days, then 3 tabs for 2 days, 2 tabs for 2 days, then 1 tab by mouth daily for 2 days (Patient not taking: Reported on 04/06/2021) 42 tablet 0   promethazine-dextromethorphan (PROMETHAZINE-DM) 6.25-15 MG/5ML syrup Take 2.5 mLs by mouth 4 (four) times daily as needed for cough. (Patient not taking: Reported on 04/06/2021) 118 mL 0   venlafaxine XR (EFFEXOR-XR) 150 MG 24 hr capsule Take 1 capsule (150 mg total) by mouth daily. 60 capsule 6   No current facility-administered medications for this visit.   No results found.  Review of Systems:   A ROS was performed including pertinent positives and negatives as documented in the HPI.  Physical Exam :   Constitutional: NAD and appears stated age Neurological: Alert and oriented Psych: Appropriate affect and cooperative Last menstrual period 03/24/2021.   Comprehensive Musculoskeletal Exam:    Musculoskeletal Exam    Inspection Right Left  Skin No atrophy or winging No atrophy or winging  Palpation    Tenderness Biceps, anterior lateral shoulder None  Range of Motion    Flexion (passive) 120 150  Flexion (active) 60 100  Abduction 60 100  ER at the side 0 40  Can reach behind back to Back pocket L3  Strength     4/5 supra and  infraspinatus with pain, negative belly press Full with pain  Special Tests    Pseudoparalytic No No  Neurologic    Fires PIN, radial, median, ulnar, musculocutaneous, axillary, suprascapular, long thoracic, and spinal accessory innervated muscles. No abnormal sensibility  Vascular/Lymphatic    Radial Pulse 2+ 2+  Cervical Exam    Patient has symmetric cervical range of motion with negative Spurling's test.  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  There is significant fatty infiltration involving the infraspinatus on her MRI  CT Right shoulder: Evidence of posterior glenoid (B2) wear  I personally reviewed and interpreted the radiographs.   Assessment:   51 y.o. female right-hand-dominant presents with right shoulder pain in the setting of an acute rotator cuff injury which she sustained when she was pulling laundry out of the dryer.  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  She is now not even able to scratch the back of her neck without significant pain.  She has previously had an injection which did not provide long-term relief and I have started her on a supervised shoulder exercise program which she is having an extremely difficult time completing.  At today's visit I discussed treatment options with her.  Overall given the fact that she does have severe atrophy of the supra as well as infraspinatus on MRI I do not necessarily believe a superior capsular reconstruction would be her best treatment option.  While she is young, I do believe that a reverse shoulder arthroplasty would be a much more durable solution.  At this time she is quite frustrated  by the inability to do even basic activities like playing with her grandchildren.  She is not able to reach overhead to put anything in an overhead cabinet.  I did discuss the rehab associated with a reverse shoulder arthroplasty versus a superior capsular reconstruction.  I did discuss the knee complications that can come with arthroplasty as well particularly given the fact that she is IV rheumatoid infusions we did discuss issues with wound infection and healing.  After long discussion she has ultimately elected for right shoulder reverse shoulder arthroplasty  Plan :    -Plan for right shoulder reverse shoulder arthroplasty   After a lengthy discussion of treatment options, including risks, benefits, alternatives, complications of surgical and nonsurgical conservative options, the patient elected surgical repair.   The patient  is aware of the material risks  and complications including, but not limited to injury to adjacent structures, neurovascular injury, infection, numbness, bleeding, implant failure, thermal burns, stiffness, persistent pain, failure to heal, disease transmission from allograft, need for further surgery, dislocation, anesthetic risks, blood clots, risks of death,and others. The probabilities of surgical success and failure discussed with patient given their particular co-morbidities.The time and nature of expected rehabilitation and recovery was discussed.The patient's questions were all answered preoperatively.  No barriers to understanding were noted. I explained the natural history of the disease process and Rx rationale.  I explained to the patient what I considered to be reasonable expectations given their personal situation.  The final treatment plan was arrived at through a shared patient decision making process model.      I personally saw and evaluated the patient, and participated in the management and treatment plan.  Vanetta Mulders, MD Attending Physician,  Orthopedic Surgery  This document was dictated using Dragon voice recognition software. A reasonable attempt at proof reading has been made to minimize errors.

## 2021-12-03 ENCOUNTER — Encounter: Payer: Self-pay | Admitting: Physician Assistant

## 2021-12-03 ENCOUNTER — Ambulatory Visit: Payer: 59 | Admitting: Physician Assistant

## 2021-12-03 DIAGNOSIS — M1712 Unilateral primary osteoarthritis, left knee: Secondary | ICD-10-CM

## 2021-12-03 DIAGNOSIS — M1711 Unilateral primary osteoarthritis, right knee: Secondary | ICD-10-CM

## 2021-12-03 DIAGNOSIS — M170 Bilateral primary osteoarthritis of knee: Secondary | ICD-10-CM

## 2021-12-03 MED ORDER — LIDOCAINE HCL 1 % IJ SOLN
3.0000 mL | INTRAMUSCULAR | Status: AC | PRN
  Administered 2021-12-03: 3 mL

## 2021-12-03 MED ORDER — HYALURONAN 88 MG/4ML IX SOSY
88.0000 mg | PREFILLED_SYRINGE | INTRA_ARTICULAR | Status: AC | PRN
  Administered 2021-12-03: 88 mg via INTRA_ARTICULAR

## 2021-12-03 NOTE — Progress Notes (Signed)
   Procedure Note  Patient: Anna Keller             Date of Birth: 09-10-1970           MRN: [id number]             Visit Date: 12/03/2021  HPI: Anna Keller comes in today for supplemental injections of both knees.She has rheumatoid arthritis and osteoarthritis.  She denies any injury to either knee recently.  She has known moderate tricompartmental arthritis both knees.  She has no upcoming surgical intervention on either knee.  Physical exam: General well-developed well-nourished female no acute distress mood and affect appropriate. Bilateral knees: Left knee no abnormal warmth erythema or effusion.  Right knee no abnormal warmth or erythema.  Slight effusion right knee.  Global tenderness about the right knee.  Good range of motion of both knees with full extension and flexion beyond 90 degrees. Procedures: Visit Diagnoses:  1. Arthritis of knee, left   2. Arthritis of knee, right     Large Joint Inj: bilateral knee on 12/03/2021 4:37 PM Indications: pain Details: 22 G 1.5 in needle, superolateral approach  Arthrogram: No  Medications (Right): 3 mL lidocaine 1 %; 88 mg Hyaluronan 88 MG/4ML Aspirate (Right): 5 mL yellow Medications (Left): 88 mg Hyaluronan 88 MG/4ML Outcome: tolerated well, no immediate complications Procedure, treatment alternatives, risks and benefits explained, specific risks discussed. Consent was given by the patient. Immediately prior to procedure a time out was called to verify the correct patient, procedure, equipment, support staff and site/side marked as required. Patient was prepped and draped in the usual sterile fashion.     Plan: Both knees were wrapped with Ace bandages she will remove these this evening before retiring to bed.  She understands to wait at least 6 months between supplemental injections.  Questions were encouraged and answered

## 2021-12-04 ENCOUNTER — Ambulatory Visit (HOSPITAL_BASED_OUTPATIENT_CLINIC_OR_DEPARTMENT_OTHER)
Admission: RE | Admit: 2021-12-04 | Discharge: 2021-12-04 | Disposition: A | Discharge status: Home / Self Care | Payer: 59 | Source: Ambulatory Visit | Attending: Orthopaedic Surgery | Admitting: Orthopaedic Surgery

## 2021-12-04 DIAGNOSIS — M19011 Primary osteoarthritis, right shoulder: Secondary | ICD-10-CM | POA: Diagnosis not present

## 2021-12-04 DIAGNOSIS — S46011A Strain of muscle(s) and tendon(s) of the rotator cuff of right shoulder, initial encounter: Secondary | ICD-10-CM | POA: Diagnosis not present

## 2021-12-04 DIAGNOSIS — M75121 Complete rotator cuff tear or rupture of right shoulder, not specified as traumatic: Secondary | ICD-10-CM | POA: Diagnosis not present

## 2021-12-08 ENCOUNTER — Other Ambulatory Visit (HOSPITAL_BASED_OUTPATIENT_CLINIC_OR_DEPARTMENT_OTHER): Payer: Self-pay | Admitting: Orthopaedic Surgery

## 2021-12-08 DIAGNOSIS — S46011A Strain of muscle(s) and tendon(s) of the rotator cuff of right shoulder, initial encounter: Secondary | ICD-10-CM

## 2021-12-10 ENCOUNTER — Ambulatory Visit: Payer: 59 | Admitting: Physician Assistant

## 2021-12-28 ENCOUNTER — Ambulatory Visit (HOSPITAL_BASED_OUTPATIENT_CLINIC_OR_DEPARTMENT_OTHER): Payer: Self-pay | Admitting: Orthopaedic Surgery

## 2021-12-28 DIAGNOSIS — S46011A Strain of muscle(s) and tendon(s) of the rotator cuff of right shoulder, initial encounter: Secondary | ICD-10-CM

## 2021-12-28 NOTE — Pre-Procedure Instructions (Signed)
Surgical Instructions    Your procedure is scheduled on January 05, 2022.  Report to [facility] Main Entrance "A" at 11:30 A.M., then check in with the Admitting office.  Call this number if you have problems the morning of surgery:  [phone]   If you have any questions prior to your surgery date call [phone]: Open Monday-Friday 8am-4pm    Remember:  Do not eat after midnight the night before your surgery  You may drink clear liquids until 10:30 AM the morning of your surgery.   Clear liquids allowed are: Water, Non-Citrus Juices (without pulp), Carbonated Beverages, Clear Tea, Black Coffee Only (NO MILK, CREAM OR POWDERED CREAMER of any kind), and Gatorade.  Patient Instructions  The night before surgery:  No food after midnight. ONLY clear liquids after midnight  The day of surgery (if you do NOT have diabetes):  Drink ONE (1) Pre-Surgery Clear Ensure by 10:30 AM the morning of surgery. Drink in one sitting. Do not sip.  This drink was given to you during your hospital  pre-op appointment visit.  Nothing else to drink after completing the  Pre-Surgery Clear Ensure.         If you have questions, please contact your surgeon's office.     Take these medicines the morning of surgery with A SIP OF WATER:  ALPRAZolam (XANAX)  busPIRone (BUSPAR)  omeprazole (PRILOSEC)  oxybutynin (DITROPAN-XL)  rosuvastatin (CRESTOR)   venlafaxine XR (EFFEXOR-XR)    Take these medicines the morning of surgery AS NEEDED:  albuterol (VENTOLIN HFA)    As of today, STOP taking any Aspirin (unless otherwise instructed by your surgeon) Aleve, Naproxen, Ibuprofen, Motrin, Advil, Goody's, BC's, all herbal medications, fish oil, and all vitamins. This includes your medication: celecoxib (CELEBREX) and diclofenac (VOLTAREN)                     Do NOT Smoke (Tobacco/Vaping) for 24 hours prior to your procedure.  If you use a CPAP at night, you may bring your mask/headgear for your  overnight stay.   Contacts, glasses, piercing's, hearing aid's, dentures or partials may not be worn into surgery, please bring cases for these belongings.    For patients admitted to the hospital, discharge time will be determined by your treatment team.   Patients discharged the day of surgery will not be allowed to drive home, and someone needs to stay with them for 24 hours.  SURGICAL WAITING ROOM VISITATION Patients having surgery or a procedure may have no more than 2 support people in the waiting area - these visitors may rotate.   Children under the age of 31 must have an adult with them who is not the patient. If the patient needs to stay at the hospital during part of their recovery, the visitor guidelines for inpatient rooms apply. Pre-op nurse will coordinate an appropriate time for 1 support person to accompany patient in pre-op.  This support person may not rotate.   Please refer to the [facility] website for the visitor guidelines for Inpatients (after your surgery is over and you are in a regular room).   Oral Hygiene is also important to reduce your risk of infection.  Remember - BRUSH YOUR TEETH THE MORNING OF SURGERY WITH YOUR REGULAR TOOTHPASTE  [place]- Preparing for Total Shoulder Arthroplasty  Before surgery, you can play an important role. Because skin is not sterile, your skin needs to be as free of germs as possible. You can reduce the  number of germs on your skin by using the following products.   Benzoyl Peroxide Gel  o Reduces the number of germs present on the skin  o Applied twice a day to shoulder area starting two days before surgery   Chlorhexidine Gluconate (CHG) Soap (instructions listed above on how to wash with CHG Soap)  o An antiseptic cleaner that kills germs and bonds with the skin to continue killing germs even after washing  o Used for showering the night before surgery and morning of  surgery   ==================================================================  Please follow these instructions carefully:  BENZOYL PEROXIDE 5% GEL  Please do not use if you have an allergy to benzoyl peroxide. If your skin becomes reddened/irritated stop using the benzoyl peroxide.  Starting two days before surgery, apply as follows:  1. Apply benzoyl peroxide in the morning and at night. Apply after taking a shower. If you are not taking a shower clean entire shoulder front, back, and side along with the armpit with a clean wet washcloth.  2. Place a quarter-sized dollop on your SHOULDER and rub in thoroughly, making sure to cover the front, back, and side of your shoulder, along with the armpit.   2 Days prior to Surgery First Dose on 9/3 Morning Second Dose on 9/3 Night  Day Before Surgery First Dose on 9/4 Morning Night before surgery wash (entire body except face and private areas) with CHG Soap THEN Second Dose on 9/4 Night   Morning of Surgery  wash BODY AGAIN with CHG Soap   4. Do NOT apply benzoyl peroxide gel on the day of surgery   [place]- Preparing For Surgery  Before surgery, you can play an important role. Because skin is not sterile, your skin needs to be as free of germs as possible. You can reduce the number of germs on your skin by washing with CHG (chlorahexidine gluconate) Soap before surgery.  CHG is an antiseptic cleaner which kills germs and bonds with the skin to continue killing germs even after washing.     Please do not use if you have an allergy to CHG or antibacterial soaps. If your skin becomes reddened/irritated stop using the CHG.  Do not shave (including legs and underarms) for at least 48 hours prior to first CHG shower. It is OK to shave your face.  Please follow these instructions carefully.     Shower the NIGHT BEFORE SURGERY and the MORNING OF SURGERY with CHG Soap.   If you chose to wash your hair, wash your hair first as usual  with your normal shampoo. After you shampoo, rinse your hair and body thoroughly to remove the shampoo.  Then [employer] and genitals (private parts) with your normal soap and rinse thoroughly to remove soap.  After that Use CHG Soap as you would any other liquid soap. You can apply CHG directly to the skin and wash gently with a scrungie or a clean washcloth.   Apply the CHG Soap to your body ONLY FROM THE NECK DOWN.  Do not use on open wounds or open sores. Avoid contact with your eyes, ears, mouth and genitals (private parts). Wash Face and genitals (private parts)  with your normal soap.   Wash thoroughly, paying special attention to the area where your surgery will be performed.  Thoroughly rinse your body with warm water from the neck down.  DO NOT shower/wash with your normal soap after using and rinsing off the CHG Soap.  Pat yourself dry  with a CLEAN TOWEL.  8. Apply the Benzoyl Peroxide only the night before surgery.  Do Not use it the morning of surgery.  Wear CLEAN PAJAMAS to bed the night before surgery  Place CLEAN SHEETS on your bed the night before your surgery  DO NOT SLEEP WITH PETS.   Day of Surgery: Take a shower with CHG soap. Wear Clean/Comfortable clothing the morning of surgery Do not apply any deodorants/lotions.   Remember to brush your teeth WITH YOUR REGULAR TOOTHPASTE.    Please read over the following fact sheets that you were given.    If you received a COVID test during your pre-op visit  it is requested that you wear a mask when out in public, stay away from anyone that may not be feeling well and notify your surgeon if you develop symptoms. If you have been in contact with anyone that has tested positive in the last 10 days please notify you surgeon.

## 2021-12-29 ENCOUNTER — Encounter (HOSPITAL_COMMUNITY)
Admission: RE | Admit: 2021-12-29 | Discharge: 2021-12-29 | Disposition: A | Discharge status: Home / Self Care | Payer: 59 | Source: Ambulatory Visit | Attending: Orthopaedic Surgery | Admitting: Orthopaedic Surgery

## 2021-12-29 ENCOUNTER — Encounter (HOSPITAL_COMMUNITY): Payer: Self-pay

## 2021-12-29 ENCOUNTER — Telehealth (HOSPITAL_BASED_OUTPATIENT_CLINIC_OR_DEPARTMENT_OTHER): Payer: Self-pay | Admitting: Orthopaedic Surgery

## 2021-12-29 ENCOUNTER — Other Ambulatory Visit: Payer: Self-pay

## 2021-12-29 VITALS — BP 121/89 | HR 85 | Temp 97.5°F | Resp 18 | Ht 66.0 in | Wt 278.9 lb

## 2021-12-29 DIAGNOSIS — Z01812 Encounter for preprocedural laboratory examination: Secondary | ICD-10-CM | POA: Diagnosis not present

## 2021-12-29 DIAGNOSIS — Z01818 Encounter for other preprocedural examination: Secondary | ICD-10-CM

## 2021-12-29 LAB — CBC
HCT: 42.3 % (ref 36.0–46.0)
Hemoglobin: 14.3 g/dL (ref 12.0–15.0)
MCH: 32.1 pg (ref 26.0–34.0)
MCHC: 33.8 g/dL (ref 30.0–36.0)
MCV: 95.1 fL (ref 80.0–100.0)
Platelets: 288 10*3/uL (ref 150–400)
RBC: 4.45 MIL/uL (ref 3.87–5.11)
RDW: 13.6 % (ref 11.5–15.5)
WBC: 7.6 10*3/uL (ref 4.0–10.5)
nRBC: 0 % (ref 0.0–0.2)

## 2021-12-29 LAB — SURGICAL PCR SCREEN
MRSA, PCR: POSITIVE — AB
Staphylococcus aureus: POSITIVE — AB

## 2021-12-29 NOTE — Telephone Encounter (Signed)
Pre op Rx MyChart message RN with pre operative testing area called office and stated they have tried contacting pt about picking up an Rx they sent to [facility] in [place] and asked if I could contact her another way.

## 2021-12-29 NOTE — Progress Notes (Signed)
PCP - Chelsa H. Charlynn Grimes, NP Cardiologist - Denies  PPM/ICD - Denies Device Orders - n/a Rep Notified - n/a  Chest x-ray - 03/21/2021 EKG - Denies Stress Test - Denies ECHO - Denies Cardiac Cath - Denies  Sleep Study - Denies CPAP - n/a  No DM  Blood Thinner Instructions: n/a Aspirin Instructions: n/a  ERAS Protcol - Yes. Clear liquids until 1030 morning of surgery PRE-SURGERY Ensure or G2- Ensure given to patient at PAT appointment  COVID TEST- n/a   Anesthesia review: No.   Patient denies shortness of breath, fever, cough and chest pain at PAT appointment   All instructions explained to the patient, with a verbal understanding of the material. Patient agrees to go over the instructions while at home for a better understanding. Patient also instructed to self quarantine after being tested for COVID-19. The opportunity to ask questions was provided.

## 2021-12-29 NOTE — Progress Notes (Signed)
RN attempted to call patient multiple times with Surgical PCR results. Prescription sent to pharmacy that was verified at PAT visit. Anna Keller with Dr. Eddie Dibbles office notified of PCR results and will try to contact pt with results and instructions.

## 2022-01-05 ENCOUNTER — Encounter (HOSPITAL_BASED_OUTPATIENT_CLINIC_OR_DEPARTMENT_OTHER): Payer: Self-pay | Admitting: Orthopaedic Surgery

## 2022-01-05 ENCOUNTER — Other Ambulatory Visit: Payer: Self-pay

## 2022-01-05 ENCOUNTER — Ambulatory Visit (HOSPITAL_COMMUNITY)
Admission: RE | Admit: 2022-01-05 | Discharge: 2022-01-05 | Disposition: A | Discharge status: Home / Self Care | Payer: 59 | Attending: Orthopaedic Surgery | Admitting: Orthopaedic Surgery

## 2022-01-05 ENCOUNTER — Other Ambulatory Visit (HOSPITAL_BASED_OUTPATIENT_CLINIC_OR_DEPARTMENT_OTHER): Payer: Self-pay | Admitting: Orthopaedic Surgery

## 2022-01-05 ENCOUNTER — Ambulatory Visit (HOSPITAL_COMMUNITY): Payer: 59 | Admitting: Anesthesiology

## 2022-01-05 ENCOUNTER — Ambulatory Visit (HOSPITAL_COMMUNITY): Payer: 59

## 2022-01-05 ENCOUNTER — Encounter (HOSPITAL_COMMUNITY)
Admission: RE | Disposition: A | Discharge status: Home / Self Care | Payer: Self-pay | Source: Home / Self Care | Attending: Orthopaedic Surgery

## 2022-01-05 ENCOUNTER — Other Ambulatory Visit (HOSPITAL_BASED_OUTPATIENT_CLINIC_OR_DEPARTMENT_OTHER): Payer: Self-pay

## 2022-01-05 ENCOUNTER — Ambulatory Visit (HOSPITAL_BASED_OUTPATIENT_CLINIC_OR_DEPARTMENT_OTHER): Payer: 59 | Admitting: Anesthesiology

## 2022-01-05 ENCOUNTER — Encounter (HOSPITAL_COMMUNITY): Payer: Self-pay | Admitting: Orthopaedic Surgery

## 2022-01-05 DIAGNOSIS — M459 Ankylosing spondylitis of unspecified sites in spine: Secondary | ICD-10-CM | POA: Diagnosis not present

## 2022-01-05 DIAGNOSIS — X58XXXA Exposure to other specified factors, initial encounter: Secondary | ICD-10-CM | POA: Diagnosis not present

## 2022-01-05 DIAGNOSIS — Z87891 Personal history of nicotine dependence: Secondary | ICD-10-CM | POA: Insufficient documentation

## 2022-01-05 DIAGNOSIS — K219 Gastro-esophageal reflux disease without esophagitis: Secondary | ICD-10-CM | POA: Insufficient documentation

## 2022-01-05 DIAGNOSIS — Z6841 Body Mass Index (BMI) 40.0 and over, adult: Secondary | ICD-10-CM | POA: Diagnosis not present

## 2022-01-05 DIAGNOSIS — Z791 Long term (current) use of non-steroidal anti-inflammatories (NSAID): Secondary | ICD-10-CM | POA: Insufficient documentation

## 2022-01-05 DIAGNOSIS — S46011A Strain of muscle(s) and tendon(s) of the rotator cuff of right shoulder, initial encounter: Secondary | ICD-10-CM

## 2022-01-05 DIAGNOSIS — Z471 Aftercare following joint replacement surgery: Secondary | ICD-10-CM | POA: Diagnosis not present

## 2022-01-05 DIAGNOSIS — F418 Other specified anxiety disorders: Secondary | ICD-10-CM

## 2022-01-05 DIAGNOSIS — M75101 Unspecified rotator cuff tear or rupture of right shoulder, not specified as traumatic: Secondary | ICD-10-CM

## 2022-01-05 DIAGNOSIS — M069 Rheumatoid arthritis, unspecified: Secondary | ICD-10-CM | POA: Diagnosis not present

## 2022-01-05 DIAGNOSIS — Z96611 Presence of right artificial shoulder joint: Secondary | ICD-10-CM | POA: Diagnosis not present

## 2022-01-05 DIAGNOSIS — Z79899 Other long term (current) drug therapy: Secondary | ICD-10-CM | POA: Insufficient documentation

## 2022-01-05 DIAGNOSIS — R69 Illness, unspecified: Secondary | ICD-10-CM | POA: Diagnosis not present

## 2022-01-05 DIAGNOSIS — G8918 Other acute postprocedural pain: Secondary | ICD-10-CM | POA: Diagnosis not present

## 2022-01-05 HISTORY — PX: REVERSE SHOULDER ARTHROPLASTY: SHX5054

## 2022-01-05 SURGERY — ARTHROPLASTY, SHOULDER, TOTAL, REVERSE
Anesthesia: Regional | Site: Shoulder | Laterality: Right

## 2022-01-05 MED ORDER — CEFAZOLIN IN SODIUM CHLORIDE 3-0.9 GM/100ML-% IV SOLN
3.0000 g | INTRAVENOUS | Status: AC
  Administered 2022-01-05: 3 g via INTRAVENOUS

## 2022-01-05 MED ORDER — PHENYLEPHRINE HCL-NACL 20-0.9 MG/250ML-% IV SOLN
INTRAVENOUS | Status: DC | PRN
  Administered 2022-01-05: 50 ug/min via INTRAVENOUS

## 2022-01-05 MED ORDER — FENTANYL CITRATE (PF) 250 MCG/5ML IJ SOLN
INTRAMUSCULAR | Status: DC | PRN
  Administered 2022-01-05: 50 ug via INTRAVENOUS

## 2022-01-05 MED ORDER — CHLORHEXIDINE GLUCONATE 0.12 % MT SOLN
OROMUCOSAL | Status: AC
  Administered 2022-01-05: 15 mL via OROMUCOSAL
  Filled 2022-01-05: qty 15

## 2022-01-05 MED ORDER — OXYCODONE HCL 5 MG/5ML PO SOLN: 5.0000 mg | Freq: Once | ORAL | Status: DC | PRN

## 2022-01-05 MED ORDER — MEPERIDINE HCL 25 MG/ML IJ SOLN: 6.2500 mg | INTRAMUSCULAR | Status: DC | PRN

## 2022-01-05 MED ORDER — CEFAZOLIN SODIUM-DEXTROSE 2-4 GM/100ML-% IV SOLN
INTRAVENOUS | Status: AC
  Filled 2022-01-05: qty 100

## 2022-01-05 MED ORDER — ACETAMINOPHEN 500 MG PO TABS
ORAL_TABLET | ORAL | Status: AC
  Administered 2022-01-05: 1000 mg via ORAL
  Filled 2022-01-05: qty 2

## 2022-01-05 MED ORDER — ONDANSETRON HCL 4 MG/2ML IJ SOLN
INTRAMUSCULAR | Status: DC | PRN
  Administered 2022-01-05: 4 mg via INTRAVENOUS

## 2022-01-05 MED ORDER — AMISULPRIDE (ANTIEMETIC) 5 MG/2ML IV SOLN: 10.0000 mg | Freq: Once | INTRAVENOUS | Status: DC | PRN

## 2022-01-05 MED ORDER — DEXAMETHASONE SODIUM PHOSPHATE 10 MG/ML IJ SOLN
INTRAMUSCULAR | Status: DC | PRN
  Administered 2022-01-05: 10 mg via INTRAVENOUS

## 2022-01-05 MED ORDER — ONDANSETRON HCL 4 MG/2ML IJ SOLN: 4.0000 mg | Freq: Once | INTRAMUSCULAR | Status: DC | PRN

## 2022-01-05 MED ORDER — FENTANYL CITRATE (PF) 100 MCG/2ML IJ SOLN: 100.0000 ug | Freq: Once | INTRAMUSCULAR | Status: AC

## 2022-01-05 MED ORDER — SODIUM CHLORIDE 0.9 % IR SOLN
Status: DC | PRN
  Administered 2022-01-05: 3000 mL

## 2022-01-05 MED ORDER — GABAPENTIN 300 MG PO CAPS
ORAL_CAPSULE | ORAL | Status: AC
  Administered 2022-01-05: 300 mg via ORAL
  Filled 2022-01-05: qty 1

## 2022-01-05 MED ORDER — LACTATED RINGERS IV SOLN: INTRAVENOUS | Status: DC

## 2022-01-05 MED ORDER — ACETAMINOPHEN 500 MG PO TABS: 1000.0000 mg | ORAL_TABLET | Freq: Once | ORAL | Status: AC

## 2022-01-05 MED ORDER — ASPIRIN 325 MG PO TBEC
325.0000 mg | DELAYED_RELEASE_TABLET | Freq: Every day | ORAL | 0 refills | Status: DC
  Filled 2022-01-05: qty 30, 30d supply, fill #0

## 2022-01-05 MED ORDER — OXYCODONE HCL 5 MG PO TABS
5.0000 mg | ORAL_TABLET | Freq: Four times a day (QID) | ORAL | 0 refills | Status: DC | PRN
  Filled 2022-01-05: qty 20, 5d supply, fill #0

## 2022-01-05 MED ORDER — GABAPENTIN 300 MG PO CAPS: 300.0000 mg | ORAL_CAPSULE | Freq: Once | ORAL | Status: AC

## 2022-01-05 MED ORDER — FENTANYL CITRATE (PF) 100 MCG/2ML IJ SOLN
INTRAMUSCULAR | Status: AC
  Administered 2022-01-05: 100 ug via INTRAVENOUS
  Filled 2022-01-05: qty 2

## 2022-01-05 MED ORDER — CHLORHEXIDINE GLUCONATE 0.12 % MT SOLN: 15.0000 mL | Freq: Once | OROMUCOSAL | Status: AC

## 2022-01-05 MED ORDER — MIDAZOLAM HCL 2 MG/2ML IJ SOLN
INTRAMUSCULAR | Status: AC
  Administered 2022-01-05: 2 mg via INTRAVENOUS
  Filled 2022-01-05: qty 2

## 2022-01-05 MED ORDER — BUPIVACAINE LIPOSOME 1.3 % IJ SUSP
INTRAMUSCULAR | Status: DC | PRN
  Administered 2022-01-05: 10 mL via PERINEURAL

## 2022-01-05 MED ORDER — LIDOCAINE 2% (20 MG/ML) 5 ML SYRINGE
INTRAMUSCULAR | Status: DC | PRN
  Administered 2022-01-05: 40 mg via INTRAVENOUS

## 2022-01-05 MED ORDER — VANCOMYCIN HCL 1000 MG IV SOLR
INTRAVENOUS | Status: DC | PRN
  Administered 2022-01-05: 1000 mg via INTRAVENOUS

## 2022-01-05 MED ORDER — VANCOMYCIN HCL IN DEXTROSE 1-5 GM/200ML-% IV SOLN
1000.0000 mg | Freq: Once | INTRAVENOUS | Status: DC
  Filled 2022-01-05: qty 200

## 2022-01-05 MED ORDER — OXYCODONE HCL 5 MG PO TABS: 5.0000 mg | ORAL_TABLET | Freq: Once | ORAL | Status: DC | PRN

## 2022-01-05 MED ORDER — CEFAZOLIN SODIUM 1 G IJ SOLR
INTRAMUSCULAR | Status: AC
  Filled 2022-01-05: qty 10

## 2022-01-05 MED ORDER — TRANEXAMIC ACID-NACL 1000-0.7 MG/100ML-% IV SOLN
INTRAVENOUS | Status: AC
  Filled 2022-01-05: qty 100

## 2022-01-05 MED ORDER — ROPIVACAINE HCL 5 MG/ML IJ SOLN
INTRAMUSCULAR | Status: DC | PRN
  Administered 2022-01-05: 15 mL via PERINEURAL

## 2022-01-05 MED ORDER — ROCURONIUM BROMIDE 10 MG/ML (PF) SYRINGE
PREFILLED_SYRINGE | INTRAVENOUS | Status: DC | PRN
  Administered 2022-01-05: 80 mg via INTRAVENOUS
  Administered 2022-01-05: 20 mg via INTRAVENOUS

## 2022-01-05 MED ORDER — ACETAMINOPHEN 500 MG PO TABS: 1000.0000 mg | ORAL_TABLET | Freq: Once | ORAL | Status: DC

## 2022-01-05 MED ORDER — 0.9 % SODIUM CHLORIDE (POUR BTL) OPTIME
TOPICAL | Status: DC | PRN
  Administered 2022-01-05: 1000 mL

## 2022-01-05 MED ORDER — PROPOFOL 10 MG/ML IV BOLUS
INTRAVENOUS | Status: DC | PRN
  Administered 2022-01-05: 150 mg via INTRAVENOUS

## 2022-01-05 MED ORDER — PHENYLEPHRINE 80 MCG/ML (10ML) SYRINGE FOR IV PUSH (FOR BLOOD PRESSURE SUPPORT)
PREFILLED_SYRINGE | INTRAVENOUS | Status: DC | PRN
  Administered 2022-01-05: 80 ug via INTRAVENOUS

## 2022-01-05 MED ORDER — MIDAZOLAM HCL 2 MG/2ML IJ SOLN
2.0000 mg | Freq: Once | INTRAMUSCULAR | Status: AC
Start: 2022-01-05 — End: 2022-01-05

## 2022-01-05 MED ORDER — SUGAMMADEX SODIUM 200 MG/2ML IV SOLN
INTRAVENOUS | Status: DC | PRN
  Administered 2022-01-05: 249.4 mg via INTRAVENOUS

## 2022-01-05 MED ORDER — IBUPROFEN 800 MG PO TABS
800.0000 mg | ORAL_TABLET | Freq: Three times a day (TID) | ORAL | 0 refills | Status: AC
  Filled 2022-01-05: qty 30, 10d supply, fill #0

## 2022-01-05 MED ORDER — VANCOMYCIN HCL 1000 MG IV SOLR: 1000.0000 mg | Freq: Once | INTRAVENOUS | Status: DC

## 2022-01-05 MED ORDER — TRANEXAMIC ACID-NACL 1000-0.7 MG/100ML-% IV SOLN
1000.0000 mg | INTRAVENOUS | Status: AC
  Administered 2022-01-05: 1000 mg via INTRAVENOUS

## 2022-01-05 MED ORDER — FENTANYL CITRATE (PF) 250 MCG/5ML IJ SOLN
INTRAMUSCULAR | Status: AC
  Filled 2022-01-05: qty 5

## 2022-01-05 MED ORDER — HYDROMORPHONE HCL 1 MG/ML IJ SOLN: 0.2500 mg | INTRAMUSCULAR | Status: DC | PRN

## 2022-01-05 MED ORDER — ORAL CARE MOUTH RINSE: 15.0000 mL | Freq: Once | OROMUCOSAL | Status: AC

## 2022-01-05 MED ORDER — VANCOMYCIN HCL 1000 MG IV SOLR
INTRAVENOUS | Status: AC
  Filled 2022-01-05: qty 20

## 2022-01-05 MED ORDER — KETOROLAC TROMETHAMINE 30 MG/ML IJ SOLN: 30.0000 mg | Freq: Once | INTRAMUSCULAR | Status: AC | PRN

## 2022-01-05 MED ORDER — VANCOMYCIN HCL 1000 MG IV SOLR
INTRAVENOUS | Status: DC | PRN
  Administered 2022-01-05: 1000 mg

## 2022-01-05 MED ORDER — ACETAMINOPHEN 500 MG PO TABS
500.0000 mg | ORAL_TABLET | Freq: Three times a day (TID) | ORAL | 0 refills | Status: AC
  Filled 2022-01-05: qty 30, 10d supply, fill #0

## 2022-01-05 SURGICAL SUPPLY — 90 items
ADH SKN CLS APL DERMABOND .7 (GAUZE/BANDAGES/DRESSINGS) ×2
AID PSTN UNV HD RSTRNT DISP (MISCELLANEOUS) ×1
APL PRP STRL LF DISP 70% ISPRP (MISCELLANEOUS) ×1
AUG BASEPLATE 15DEG 25 WEDGE (Joint) ×1 IMPLANT
AUGMENT BASEPLATE 15DEG 25 WDG (Joint) IMPLANT
BAG COUNTER SPONGE SURGICOUNT (BAG) ×1 IMPLANT
BAG SPNG CNTER NS LX DISP (BAG) ×1
BETADINE 5% OPHTHALMIC (OPHTHALMIC) ×1 IMPLANT
BIT DRILL 3.2 PERIPHERAL SCREW (BIT) IMPLANT
BLADE SAW SAG 29X58X.64 (BLADE) ×1 IMPLANT
BLADE SURG 15 STRL LF DISP TIS (BLADE) ×1 IMPLANT
BLADE SURG 15 STRL SS (BLADE) ×1
BSPLAT GLND 15D 25 FULL WDG (Joint) ×1 IMPLANT
CHLORAPREP W/TINT 26 (MISCELLANEOUS) ×1 IMPLANT
CLSR STERI-STRIP ANTIMIC 1/2X4 (GAUZE/BANDAGES/DRESSINGS) IMPLANT
COOLER ICEMAN CLASSIC (MISCELLANEOUS) ×1 IMPLANT
COVER SURGICAL LIGHT HANDLE (MISCELLANEOUS) ×1 IMPLANT
DERMABOND ADVANCED (GAUZE/BANDAGES/DRESSINGS) ×2
DERMABOND ADVANCED .7 DNX12 (GAUZE/BANDAGES/DRESSINGS) IMPLANT
DRAPE HALF SHEET 40X57 (DRAPES) IMPLANT
DRAPE IMP U-DRAPE 54X76 (DRAPES) ×1 IMPLANT
DRAPE INCISE IOBAN 66X45 STRL (DRAPES) ×1 IMPLANT
DRAPE ORTHO SPLIT 77X108 STRL (DRAPES) ×1
DRAPE STERI 35X30 U-POUCH (DRAPES) ×1 IMPLANT
DRAPE SURG ORHT 6 SPLT 77X108 (DRAPES) ×1 IMPLANT
DRAPE U-SHAPE 47X51 STRL (DRAPES) ×2 IMPLANT
DRSG AQUACEL AG ADV 3.5X10 (GAUZE/BANDAGES/DRESSINGS) ×1 IMPLANT
DRSG TEGADERM 4X4.75 (GAUZE/BANDAGES/DRESSINGS) IMPLANT
ELECT BLADE 4.0 EZ CLEAN MEGAD (MISCELLANEOUS) ×1
ELECT REM PT RETURN 9FT ADLT (ELECTROSURGICAL) ×1
ELECTRODE BLDE 4.0 EZ CLN MEGD (MISCELLANEOUS) ×1 IMPLANT
ELECTRODE REM PT RTRN 9FT ADLT (ELECTROSURGICAL) ×1 IMPLANT
GAUZE PAD ABD 8X10 STRL (GAUZE/BANDAGES/DRESSINGS) IMPLANT
GAUZE SPONGE 4X4 12PLY STRL (GAUZE/BANDAGES/DRESSINGS) IMPLANT
GAUZE XEROFORM 1X8 LF (GAUZE/BANDAGES/DRESSINGS) IMPLANT
GLENOSPHERE REV SHOULDER 36 (Joint) IMPLANT
GLOVE BIOGEL PI IND STRL 6.5 (GLOVE) ×1 IMPLANT
GLOVE BIOGEL PI IND STRL 8 (GLOVE) ×2 IMPLANT
GLOVE ECLIPSE 6.0 STRL STRAW (GLOVE) ×1 IMPLANT
GLOVE ECLIPSE 8.0 STRL XLNG CF (GLOVE) ×1 IMPLANT
GOWN STRL REUS W/ TWL LRG LVL3 (GOWN DISPOSABLE) ×2 IMPLANT
GOWN STRL REUS W/TWL LRG LVL3 (GOWN DISPOSABLE) ×2
GUIDE PIN 3X75 SHOULDER (PIN) ×1
GUIDEPIN HUM 3X100 (PIN) IMPLANT
GUIDEWIRE GLENOID 2.5X220 (WIRE) IMPLANT
HANDPIECE INTERPULSE COAX TIP (DISPOSABLE) ×1
INSERT REVERSED HUMERAL SIZE 1 (Orthopedic Implant) IMPLANT
KIT BASIN OR (CUSTOM PROCEDURE TRAY) ×1 IMPLANT
KIT TURNOVER KIT B (KITS) ×1 IMPLANT
MANIFOLD NEPTUNE II (INSTRUMENTS) ×1 IMPLANT
NDL 18GX1X1/2 (RX/OR ONLY) (NEEDLE) IMPLANT
NDL HYPO 25GX1X1/2 BEV (NEEDLE) ×1 IMPLANT
NDL HYPO 25X1 1.5 SAFETY (NEEDLE) IMPLANT
NEEDLE 18GX1X1/2 (RX/OR ONLY) (NEEDLE) ×1 IMPLANT
NEEDLE HYPO 25GX1X1/2 BEV (NEEDLE) ×1 IMPLANT
NEEDLE HYPO 25X1 1.5 SAFETY (NEEDLE) ×1 IMPLANT
NS IRRIG 1000ML POUR BTL (IV SOLUTION) ×1 IMPLANT
OPHTHALMIC BETADINE 5% (OPHTHALMIC) ×1
PACK SHOULDER (CUSTOM PROCEDURE TRAY) ×1 IMPLANT
PACK UNIVERSAL I (CUSTOM PROCEDURE TRAY) ×1 IMPLANT
PAD ARMBOARD 7.5X6 YLW CONV (MISCELLANEOUS) ×2 IMPLANT
PAD COLD SHLDR WRAP-ON (PAD) ×1 IMPLANT
PIN GUIDE 3X75 SHOULDER (PIN) IMPLANT
RESTRAINT HEAD UNIVERSAL NS (MISCELLANEOUS) ×1 IMPLANT
SCREW 5.0X18 (Screw) IMPLANT
SCREW BONE INTRNL SM 7 (Screw) IMPLANT
SCREW PERIPHERAL 30 (Screw) IMPLANT
SET HNDPC FAN SPRY TIP SCT (DISPOSABLE) ×1 IMPLANT
SLING ARM IMMOBILIZER LRG (SOFTGOODS) ×1 IMPLANT
SPONGE T-LAP 18X18 ~~LOC~~+RFID (SPONGE) ×1 IMPLANT
STAPLER VISISTAT 35W (STAPLE) ×1 IMPLANT
STEM HUMERAL STD SHORT SIZE 2 (Orthopedic Implant) IMPLANT
SUCTION FRAZIER HANDLE 10FR (MISCELLANEOUS) ×1
SUCTION TUBE FRAZIER 10FR DISP (MISCELLANEOUS) ×1 IMPLANT
SUT FIBERWIRE #2 38 REV NDL BL (SUTURE) ×2
SUT MNCRL AB 3-0 PS2 18 (SUTURE) IMPLANT
SUT VIC AB 0 CT1 27 (SUTURE)
SUT VIC AB 0 CT1 27XBRD ANBCTR (SUTURE) ×1 IMPLANT
SUT VIC AB 2-0 CT1 27 (SUTURE) ×2
SUT VIC AB 2-0 CT1 TAPERPNT 27 (SUTURE) IMPLANT
SUTURE FIBERWR#2 38 REV NDL BL (SUTURE) IMPLANT
SYR 30ML LL (SYRINGE) IMPLANT
SYR CONTROL 10ML LL (SYRINGE) ×1 IMPLANT
TAPE CLOTH SURG 4X10 WHT LF (GAUZE/BANDAGES/DRESSINGS) IMPLANT
TOWEL GREEN STERILE (TOWEL DISPOSABLE) ×1 IMPLANT
TOWER CARTRIDGE SMART MIX (DISPOSABLE) ×1 IMPLANT
TRAY FOLEY MTR SLVR 16FR STAT (SET/KITS/TRAYS/PACK) ×1 IMPLANT
TUBE CONNECTING 12X1/4 (SUCTIONS) IMPLANT
WATER STERILE IRR 1000ML POUR (IV SOLUTION) ×1 IMPLANT
YANKAUER SUCT BULB TIP NO VENT (SUCTIONS) IMPLANT

## 2022-01-05 NOTE — Anesthesia Preprocedure Evaluation (Addendum)
Anesthesia Evaluation  Patient identified by MRN, date of birth, ID band Patient awake    Reviewed: Allergy & Precautions, NPO status , Patient's Chart, lab work & pertinent test results  Airway Mallampati: IV  TM Distance: >3 FB Neck ROM: Full    Dental  (+) Teeth Intact, Dental Advisory Given   Pulmonary former smoker,  Quit smoking 2017, 25 pack year history  Snores at night, has never had sleep study    Pulmonary exam normal breath sounds clear to auscultation       Cardiovascular negative cardio ROS Normal cardiovascular exam Rhythm:Regular Rate:Normal     Neuro/Psych  Headaches, PSYCHIATRIC DISORDERS Anxiety Depression    GI/Hepatic Neg liver ROS, GERD  Medicated and Controlled,  Endo/Other  Morbid obesityBMI 45  Renal/GU negative Renal ROS  negative genitourinary   Musculoskeletal  (+) Arthritis , Rheumatoid disorders,  Ankylosing spondylitis    Abdominal   Peds  Hematology negative hematology ROS (+) Hb 14.3, plt 288   Anesthesia Other Findings   Reproductive/Obstetrics negative OB ROS S/p hysterectomy                             Anesthesia Physical Anesthesia Plan  ASA: 3  Anesthesia Plan: General and Regional   Post-op Pain Management: Regional block*, Tylenol PO (pre-op)* and Toradol IV (intra-op)*   Induction: Intravenous  PONV Risk Score and Plan: 3 and Ondansetron, Dexamethasone, Midazolam and Treatment may vary due to age or medical condition  Airway Management Planned: Oral ETT  Additional Equipment: None  Intra-op Plan:   Post-operative Plan: Extubation in OR  Informed Consent: I have reviewed the patients History and Physical, chart, labs and discussed the procedure including the risks, benefits and alternatives for the proposed anesthesia with the patient or authorized representative who has indicated his/her understanding and acceptance.     Dental  advisory given  Plan Discussed with: CRNA  Anesthesia Plan Comments:        Anesthesia Quick Evaluation

## 2022-01-05 NOTE — Op Note (Signed)
Date of Surgery: 01/05/2022  INDICATIONS: Ms. Shams is a 51 y.o.-year-old female with a traumatic non repairable right shoulder rotator cuff tear.  The risk and benefits of the procedure were discussed in detail and documented in the pre-operative evaluation.   PREOPERATIVE DIAGNOSIS: 1. Right traumatic non repairable rotator cuff tear  POSTOPERATIVE DIAGNOSIS: Same.  PROCEDURE: 1. Right shoulder reverse shoulder arthroplasty  SURGEON: Yevonne Pax MD  ASSISTANT: Raynelle Fanning, ATC  ANESTHESIA:  general plus interscalene nerve block  IV FLUIDS AND URINE: See anesthesia record.  ANTIBIOTICS: Ancef and Vancomycin  ESTIMATED BLOOD LOSS: 50 mL.  IMPLANTS:  Implant Name Type Inv. Item Serial No. Manufacturer Lot No. LRB No. Used Action  AUG BASEPLATE 15DEG 25 WEDGE - [id number] Joint AUG BASEPLATE 15DEG 25 WEDGE [id number] [employer]  Right 1 Implanted  SCREW BONE INTRNL SM 7 - [id number] Screw SCREW BONE INTRNL SM 7 [id number] [employer]  Right 1 Implanted  GLENOSPHERE REV SHOULDER 36 - [id number] Joint GLENOSPHERE REV SHOULDER 36 [id number] [employer]  Right 1 Implanted  SCREW PERIPHERAL 30 - [id number] Screw SCREW PERIPHERAL 30  [employer]  Right 3 Implanted  SCREW 5.0X18 - [id number] Screw SCREW 5.0X18  [employer]  Right 1 Implanted  STEM HUMERAL STD SHORT SIZE 2 - [id number] Orthopedic Implant STEM HUMERAL STD SHORT SIZE 2 [id number] [employer]  Right 1 Implanted  INSERT REVERSED HUMERAL SIZE 1 - [id number] Orthopedic Implant INSERT REVERSED HUMERAL SIZE 1 [id number] [employer]  Right 1 Implanted    DRAINS: None  CULTURES: None  COMPLICATIONS: none  DESCRIPTION OF PROCEDURE:  Patient was identified in the preoperative holding area.  Anesthesia performed an interscalene nerve block after universal timeout was performed with nursing.  Ancef was given 1 hour prior to skin incision.    The surgical site was scrubbed with a chlorhexidine scrub brush and alcohol.  The  patient was then prepped with chlorhexidine skin prep.  The patient was subsequently taken back to the operating room.  Anesthesia was induced.  He was transferred to the beachchair position.  All bony prominences were padded.  Final timeout was again performed.     The bony landmarks of the shoulder were marked with a marking pen. A delto-pectoral incision was made, extending up approximately 5 inches. The wound with then irrigated with dilute betadine. Cephalic vein was identified, and an protected. This was retracted medially. Subdeltoid and subpectoral lesions were released. Neurovascular structures were carefully protected. The Gelpi retractor was used to retract the deltoid and pectoralis major. A 1 cm release was performed on the upper pectoralis.   The deltoid was retracted laterally with a Brown humeral retractor.  The conjoined tendon was identified. The cleido-pectoral fascia was excised.  The axillary nerve was palpated and carefully protected throughout the procedure. The biceps tendon was found and tenodesed to the upper pec with # 2 FiberWire.  Proximally the biceps tendon was removed up to the joint.  The bicipital groove was used for a landmark to establish rotator cuff interval. The subscap was tagged with a #2 FiberWire.  At this point the subscap was peeled off from the lesser tuberosity with care to avoid dissection distally in order to protect the axillary nerve.  Once the joint was exposed the proximal humerus was delivered with external rotation and extension of the arm. The humerus was prepped initially by performing a humeral neck cut. This was done with the guide using 20 degrees of retroversion as a reference.  The head portion was removed.  A medullary sounding reamer was then used.  We subsequently placed our guidewire through the center of the humeral head using the reference guide.  This was a size 2.  Metaphyseal reamer was then used.  Finally the size 2 broach was malleted  into place with excellent purchase.  A tonsil clamp was used to attempt to pull this out with very good purchase   Attention was then turned to the glenoid.  Posteriorly a large Darach retractor was used.  A 360 Degree release of the subscapularis and glenoid were done. The capsule was released from the humerus.    Glenoid retractors were placed posteriorly, superiorly behind the biceps tendon and anteriorly on the glenoid neck. A 360-degree release of the capsule was performed with cautery.  The triceps was released off the inferior tubercle of the glenoid. The axillary nerve was carefully protected with the surgeon's index finger, retracting it and using cautery.   A guidepin was placed through the glenoid guide. The guidepin was drilled until it exited the cortex. The guidepin was over drilled. Next, the glenoid was prepared with the reamer  down to cortical bone.  The central peg hole was totally within the scapular neck tested with the probe.  The baseplate was then placed screwed securely with good purchase in position and then secured with 4 screws. In each case, they were drilled and measured and the appropriate length screw placed with excellent rigid fixation of the baseplate.    A +0 rententive liner was then used with the appropriate broach.  This was brought to just the level of the reduction but not completely reduced.  A +0 retentitve final poly was selected and impacted.    Appropriate tension was noted on the conjoined tendon and deltoid muscle.  Extension was stable, external and internal rotation as well.  The subscap was pulled over but as this was not able to reach comfortably decision was made not to repair in order to prevent limited in external rotation.  The wound was then irrigated. Vancomycin powder was placed in the wound again for infection prevention.   The wound was then closed in layers with 0 Vicryl interrupted in the deep subcu followed by 2-0 Vicryl in the superficial  subcu and 3-0 monocryl for skin.  An Aquacel dressing was applied as well as an Naval architect.  A shoulder immobilizer was applied.        POSTOPERATIVE PLAN: She will be seen in PT and progress according the reverse shoulder arthroplasty protocol. I will see her back in 2 weeks for a wound check. She will be on aspirin for DVT prophylaxis.  Yevonne Pax, MD 2:32 PM

## 2022-01-05 NOTE — H&P (Signed)
Chief Complaint: Right shoulder pain        History of Present Illness:    11/30/2021: Presents today for follow-up of her right shoulder.  She is here today for MRI discussion.  Upon additional questioning she states that she did have 1 injection into the right shoulder previously over a year prior with limited relief.  She was given shoulder strengthening exercises to complete at the last visit but has had extremely limited overhead function.     Anna Keller is a 51 y.o. female right-hand-dominant presents with right shoulder pain which has been going on since March 18, 2021.  She states that she felt an audible pop at the time when she was loading cloths into her dryer and since that time has had severe pain.  She is extremely limited with regard to the right arm.  She is not able to lift the shoulder with any type of strength.  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  She states that she has young grandchildren at home and is unfortunately not able to lift them up.  She does have a known history of rheumatoid arthritis for which she receives IV infusions every 8 weeks.  With regard to the right shoulder she has previously been seen at [facility] and told that she needed a rotator cuff repair with patch augmentation.  She is on Celebrex for her rheumatoid.  At today's visit she is here for the right shoulder and significant pain.  She is quite frustrated with overall the quality of the shoulder.       Surgical History:   None   PMH/PSH/Family History/Social History/Meds/Allergies:         Past Medical History:  Diagnosis Date   Ankylosing spondylitis ([place])     Anxiety     Arthritis     Depression     Eczema     Headache     Rheumatoid arthritis ([place])           Past Surgical History:  Procedure Laterality Date   BLADDER SUSPENSION N/A 04/14/2021    Procedure: TRANSVAGINAL TAPE (TVT) PROCEDURE;  Surgeon: Sanjuana Kava, MD;  Location: [facility];   Service: Gynecology;  Laterality: N/A;  GYNCARE TVT EXACT   CYSTOSCOPY N/A 04/14/2021    Procedure: CYSTOSCOPY;  Surgeon: Sanjuana Kava, MD;  Location: [facility];  Service: Gynecology;  Laterality: N/A;   TONSILLECTOMY        as a child   TOTAL LAPAROSCOPIC HYSTERECTOMY WITH SALPINGECTOMY Bilateral 04/14/2021    Procedure: TOTAL LAPAROSCOPIC HYSTERECTOMY WITH BILATERAL SALPINGECTOMY AND BILATERAL OOPHERECTOMY;  Surgeon: Sanjuana Kava, MD;  Location: [facility];  Service: Gynecology;  Laterality: Bilateral;    Social History         Socioeconomic History   Marital status: Married      Spouse name: Legrand Como   Number of children: 1   Years of education: 12   Highest education level: High school graduate  Occupational History   Not on file  Tobacco Use   Smoking status: Former      Packs/day: 1.00      Years: 25.00      Total pack years: 25.00      Types: Cigarettes      Quit date: 01/09/2016      Years since quitting: 5.8   Smokeless tobacco: Never  Vaping Use   Vaping Use: Never used  Substance and Sexual Activity   Alcohol  use: Not Currently   Drug use: No   Sexual activity: Yes      Partners: Male      Birth control/protection: None  Other Topics Concern   Not on file  Social History Narrative   Not on file    Social Determinants of Health        Financial Resource Strain: Low Risk  (07/14/2018)    Overall Financial Resource Strain (CARDIA)     Difficulty of Paying Living Expenses: Not hard at all  Food Insecurity: No Food Insecurity (07/14/2018)    Hunger Vital Sign     Worried About Running Out of Food in the Last Year: Never true     [place] in the Last Year: Never true  Transportation Needs: No Transportation Needs (07/14/2018)    PRAPARE - Armed forces logistics/support/administrative officer (Medical): No     Lack of Transportation (Non-Medical): No  Physical Activity: Inactive (07/14/2018)    Exercise Vital Sign     Days of Exercise per Week: 0 days     Minutes of Exercise  per Session: 0 min  Stress: No Stress Concern Present (07/14/2018)    [place]     Feeling of Stress : Only a little  Social Connections: Somewhat Isolated (07/14/2018)    Social Connection and Isolation Panel [NHANES]     Frequency of Communication with Friends and Family: More than three times a week     Frequency of Social Gatherings with Friends and Family: More than three times a week     Attends Religious Services: Never     Marine scientist or Organizations: No     Attends Music therapist: Never     Marital Status: Married         Family History  Problem Relation Age of Onset   Hypertension Mother     Arthritis Father     Heart disease Sister     Pulmonary Hypertension Sister     COPD Sister     Diabetes Sister     Breast cancer Maternal Grandmother     Heart attack Paternal Grandmother     Breast cancer Maternal Aunt     Breast cancer Cousin      No Known Allergies       Current Outpatient Medications  Medication Sig Dispense Refill   albuterol (VENTOLIN HFA) 108 (90 Base) MCG/ACT inhaler Inhale 2 puffs into the lungs every 6 (six) hours as needed for wheezing or shortness of breath. 1 each 0   ALPRAZolam (XANAX) 1 MG tablet Take 1 mg by mouth 3 (three) times daily.       bethanechol (URECHOLINE) 25 MG tablet Take 1 tablet (25 mg total) by mouth 3 (three) times daily as needed (bladder spasm). 30 tablet 3   celecoxib (CELEBREX) 200 MG capsule Take 200 mg by mouth 2 (two) times daily.       cetirizine (ZYRTEC) 10 MG tablet Take 1 tablet (10 mg total) by mouth daily. 90 tablet 3   Cholecalciferol (VITAMIN D3 PO) Take 1 capsule by mouth daily.       clobetasol cream (TEMOVATE) 9.48 % Apply 1 application topically daily.       Daridorexant HCl 50 MG TABS Take 1 tablet by mouth at bedtime as needed. 30 tablet 3   HYDROcodone-acetaminophen (NORCO/VICODIN) 5-325 MG tablet Take 1 tablet by  mouth every 6 (six)  hours as needed for moderate pain. 30 tablet 0   methocarbamol (ROBAXIN) 500 MG tablet Take 1 tablet (500 mg total) by mouth every 6 (six) hours as needed for muscle spasms. 30 tablet 3   Multiple Vitamins-Minerals (MULTIVITAMIN WITH MINERALS) tablet Take 1 tablet by mouth daily.       omeprazole (PRILOSEC) 20 MG capsule Take 1 capsule by mouth once daily 90 capsule 0   phentermine (ADIPEX-P) 37.5 MG tablet Take 37.5 mg by mouth daily.       polyethylene glycol powder (GLYCOLAX/MIRALAX) 17 GM/SCOOP powder Take 17 g by mouth daily. (Patient taking differently: Take 17 g by mouth daily as needed for moderate constipation.) 3350 g 3   predniSONE (STERAPRED UNI-PAK 21 TAB) 10 MG (21) TBPK tablet Take by mouth daily. Take 6 tabs by mouth daily  for 2 days, then 5 tabs for 2 days, then 4 tabs for 2 days, then 3 tabs for 2 days, 2 tabs for 2 days, then 1 tab by mouth daily for 2 days (Patient not taking: Reported on 04/06/2021) 42 tablet 0   promethazine-dextromethorphan (PROMETHAZINE-DM) 6.25-15 MG/5ML syrup Take 2.5 mLs by mouth 4 (four) times daily as needed for cough. (Patient not taking: Reported on 04/06/2021) 118 mL 0   venlafaxine XR (EFFEXOR-XR) 150 MG 24 hr capsule Take 1 capsule (150 mg total) by mouth daily. 60 capsule 6    No current facility-administered medications for this visit.    Imaging Results (Last 48 hours)  No results found.     Review of Systems:   A ROS was performed including pertinent positives and negatives as documented in the HPI.   Physical Exam :   Constitutional: NAD and appears stated age Neurological: Alert and oriented Psych: Appropriate affect and cooperative Last menstrual period 03/24/2021.    Comprehensive Musculoskeletal Exam:     Musculoskeletal Exam      Inspection Right Left  Skin No atrophy or winging No atrophy or winging  Palpation      Tenderness Biceps, anterior lateral shoulder None  Range of Motion      Flexion (passive)  120 150  Flexion (active) 60 100  Abduction 60 100  ER at the side 0 40  Can reach behind back to Back pocket L3  Strength        4/5 supra and infraspinatus with pain, negative belly press Full with pain  Special Tests      Pseudoparalytic No No  Neurologic      Fires PIN, radial, median, ulnar, musculocutaneous, axillary, suprascapular, long thoracic, and spinal accessory innervated muscles. No abnormal sensibility  Vascular/Lymphatic      Radial Pulse 2+ 2+  Cervical Exam      Patient has symmetric cervical range of motion with negative Spurling's test.  Special Test:         Imaging:   Xray (3 views right shoulder): Normal with maintained glenohumeral space   MRI right shoulder: There is a full-thickness massive rotator cuff tear of the supra and infraspinatus with significant retraction and very minimal remaining muscle belly on sagittal T1 view.  There is significant fatty infiltration involving the infraspinatus on her MRI   CT Right shoulder: Evidence of posterior glenoid (B2) wear   I personally reviewed and interpreted the radiographs.     Assessment:   51 y.o. female right-hand-dominant presents with right shoulder pain in the setting of an acute rotator cuff injury which she sustained when she was pulling laundry out of  the dryer.  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  She is now not even able to scratch the back of her neck without significant pain.  She has previously had an injection which did not provide long-term relief and I have started her on a supervised shoulder exercise program which she is having an extremely difficult time completing.  At today's visit I discussed treatment options with her.  Overall given the fact that she does have severe atrophy of the supra as well as infraspinatus on MRI I do not necessarily believe a superior capsular reconstruction would be her best  treatment option.  While she is young, I do believe that a reverse shoulder arthroplasty would be a much more durable solution.  At this time she is quite frustrated by the inability to do even basic activities like playing with her grandchildren.  She is not able to reach overhead to put anything in an overhead cabinet.  I did discuss the rehab associated with a reverse shoulder arthroplasty versus a superior capsular reconstruction.  I did discuss the knee complications that can come with arthroplasty as well particularly given the fact that she is IV rheumatoid infusions we did discuss issues with wound infection and healing.  After long discussion she has ultimately elected for right shoulder reverse shoulder arthroplasty   Plan :     -Plan for right shoulder reverse shoulder arthroplasty     After a lengthy discussion of treatment options, including risks, benefits, alternatives, complications of surgical and nonsurgical conservative options, the patient elected surgical repair.    The patient  is aware of the material risks  and complications including, but not limited to injury to adjacent structures, neurovascular injury, infection, numbness, bleeding, implant failure, thermal burns, stiffness, persistent pain, failure to heal, disease transmission from allograft, need for further surgery, dislocation, anesthetic risks, blood clots, risks of death,and others. The probabilities of surgical success and failure discussed with patient given their particular co-morbidities.The time and nature of expected rehabilitation and recovery was discussed.The patient's questions were all answered preoperatively.  No barriers to understanding were noted. I explained the natural history of the disease process and Rx rationale.  I explained to the patient what I considered to be reasonable expectations given their personal situation.  The final treatment plan was arrived at through a shared patient decision making  process model.

## 2022-01-05 NOTE — Transfer of Care (Signed)
Immediate Anesthesia Transfer of Care Note  Patient: Anna Keller  Procedure(s) Performed: RIGHT SHOULDER REVERSE SHOULDER ARTHROPLASTY (Right: Shoulder)  Patient Location: PACU  Anesthesia Type:GA combined with regional for post-op pain  Level of Consciousness: drowsy  Airway & Oxygen Therapy: Patient Spontanous Breathing and Patient connected to nasal cannula oxygen  Post-op Assessment: Report given to RN and Post -op Vital signs reviewed and stable  Post vital signs: Reviewed and stable  Last Vitals:  Vitals Value Taken Time  BP 108/67 01/05/22 1445  Temp 36.8 C 01/05/22 1440  Pulse 81 01/05/22 1449  Resp 19 01/05/22 1449  SpO2 92 % 01/05/22 1449  Vitals shown include unvalidated device data.  Last Pain:  Vitals:   01/05/22 1440  TempSrc:   PainSc: Asleep      Patients Stated Pain Goal: 5 (35/59/74 1638)  Complications: No notable events documented.

## 2022-01-05 NOTE — Brief Op Note (Signed)
   Brief Op Note  Date of Surgery: 01/05/2022  Preoperative Diagnosis: RIGHT SHOULDER IRREPARABLE ROTATOR CUFF  Postoperative Diagnosis: same  Procedure: Procedure(s): RIGHT SHOULDER REVERSE SHOULDER ARTHROPLASTY  Implants: Implant Name Type Inv. Item Serial No. Manufacturer Lot No. LRB No. Used Action  AUG BASEPLATE 15DEG 25 WEDGE - [id number] Joint AUG BASEPLATE 15DEG 25 WEDGE [id number] [employer]  Right 1 Implanted  SCREW BONE INTRNL SM 7 - [id number] Screw SCREW BONE INTRNL SM 7 [id number] [employer]  Right 1 Implanted  GLENOSPHERE REV SHOULDER 36 - [id number] Joint GLENOSPHERE REV SHOULDER 36 [id number] [employer]  Right 1 Implanted  SCREW PERIPHERAL 30 - [id number] Screw SCREW PERIPHERAL 30  [employer]  Right 3 Implanted  SCREW 5.0X18 - [id number] Screw SCREW 5.0X18  [employer]  Right 1 Implanted  STEM HUMERAL STD SHORT SIZE 2 - [id number] Orthopedic Implant STEM HUMERAL STD SHORT SIZE 2 [id number] [employer]  Right 1 Implanted  INSERT REVERSED HUMERAL SIZE 1 - [id number] Orthopedic Implant INSERT REVERSED HUMERAL SIZE 1 [id number] [employer]  Right 1 Implanted    Surgeons: Surgeon(s): Vanetta Mulders, MD  Anesthesia: Regional    Estimated Blood Loss: See anesthesia record  Complications: None  Condition to PACU: Stable  Yevonne Pax, MD 01/05/2022 2:31 PM

## 2022-01-05 NOTE — Discharge Instructions (Signed)
     Discharge Instructions    Attending Surgeon: Vanetta Mulders, MD Office Phone Number: [phone]   Diagnosis and Procedures:    Surgeries Performed: Right reverse shoulder arthroplasty  Discharge Plan:    Diet: Resume usual diet. Begin with light or bland foods.  Drink plenty of fluids.  Activity:  Keep sling and dressing in place until your follow up visit in Physical Therapy You are advised to go home directly from the hospital or surgical center. Restrict your activities.  GENERAL INSTRUCTIONS: 1.  Keep your surgical site elevated above your heart for at least 5-7 days or longer to prevent swelling. This will improve your comfort and your overall recovery following surgery.     2. Please call Dr. Eddie Dibbles office at [phone] with questions Monday-Friday during business hours. If no one answers, please leave a message and someone should get back to the patient within 24 hours. For emergencies please call 911 or proceed to the emergency room.   3. Patient to notify surgical team if experiences any of the following: Bowel/Bladder dysfunction, uncontrolled pain, nerve/muscle weakness, incision with increased drainage or redness, nausea/vomiting and Fever greater than 101.0 F.  Be alert for signs of infection including redness, streaking, odor, fever or chills. Be alert for excessive pain or bleeding and notify your surgeon immediately.  WOUND INSTRUCTIONS:   Leave your dressing/cast/splint in place until your post operative visit.  Keep it clean and dry.  Always keep the incision clean and dry until the staples/sutures are removed. If there is no drainage from the incision you should keep it open to air. If there is drainage from the incision you must keep it covered at all times until the drainage stops  Do not soak in a bath tub, hot tub, pool, lake or other body of water until 21 days after your surgery and your incision is completely dry and healed.  If you have  removable sutures (or staples) they must be removed 10-14 days (unless otherwise instructed) from the day of your surgery.     1)  Elevate the extremity as much as possible.  2)  Keep the dressing clean and dry.  3)  Please call us if the dressing becomes wet or dirty.  4)  If you are experiencing worsening pain or worsening swelling, please call.     MEDICATIONS: Resume all previous home medications at the previous prescribed dose and frequency unless otherwise noted Start taking the  pain medications on an as-needed basis as prescribed  Please taper down pain medication over the next week following surgery.  Ideally you should not require a refill of any narcotic pain medication.  Take pain medication with food to minimize nausea. In addition to the prescribed pain medication, you may take over-the-counter pain relievers such as Tylenol.  Do NOT take additional tylenol if your pain medication already has tylenol in it.  Aspirin '325mg'$  daily for four weeks.      FOLLOWUP INSTRUCTIONS: 1. Follow up at the Physical Therapy Clinic 3-4 days following surgery. This appointment should be scheduled unless other arrangements have been made.The Physical Therapy scheduling number is [phone] if an appointment has not already been arranged.  2. Contact Dr. Eddie Dibbles office during office hours at [phone] or the practice after hours line at [phone] for non-emergencies. For medical emergencies call 911.   Discharge Location: Home

## 2022-01-05 NOTE — Anesthesia Procedure Notes (Signed)
Procedure Name: Intubation Date/Time: 01/05/2022 12:40 PM  Performed by: Minerva Ends, CRNAPre-anesthesia Checklist: Patient identified, Emergency Drugs available, Suction available and Patient being monitored Patient Re-evaluated:Patient Re-evaluated prior to induction Oxygen Delivery Method: Circle system utilized Preoxygenation: Pre-oxygenation with 100% oxygen Induction Type: IV induction Ventilation: Mask ventilation without difficulty Laryngoscope Size: Mac, 3 and Glidescope Grade View: Grade III Tube type: Oral Tube size: 7.0 mm Number of attempts: 2 Airway Equipment and Method: Stylet and Oral airway Placement Confirmation: ETT inserted through vocal cords under direct vision, positive ETCO2 and breath sounds checked- equal and bilateral Secured at: 21 cm Tube secured with: Tape Dental Injury: Teeth and Oropharynx as per pre-operative assessment  Comments: Attempt 1: DL 1 mac 3 grade 3 view unsuccessful Attempt 2: Glidescope mac 3 grade 1 view successful

## 2022-01-05 NOTE — Anesthesia Procedure Notes (Signed)
Anesthesia Regional Block: Interscalene brachial plexus block   Pre-Anesthetic Checklist: , timeout performed,  Correct Patient, Correct Site, Correct Laterality,  Correct Procedure, Correct Position, site marked,  Risks and benefits discussed,  Surgical consent,  Pre-op evaluation,  At surgeon's request and post-op pain management  Laterality: Right  Prep: Maximum Sterile Barrier Precautions used, chloraprep       Needles:  Injection technique: Single-shot  Needle Type: Echogenic Stimulator Needle     Needle Length: 9cm  Needle Gauge: 22     Additional Needles:   Procedures:,,,, ultrasound used (permanent image in chart),,    Narrative:  Start time: 01/05/2022 12:00 PM End time: 01/05/2022 12:05 PM Injection made incrementally with aspirations every 5 mL.  Performed by: Personally  Anesthesiologist: Pervis Hocking, DO  Additional Notes: Monitors applied. No increased pain on injection. No increased resistance to injection. Injection made in 5cc increments. Good needle visualization. Patient tolerated procedure well.

## 2022-01-06 ENCOUNTER — Encounter (HOSPITAL_COMMUNITY): Payer: Self-pay | Admitting: Orthopaedic Surgery

## 2022-01-06 MED ORDER — PHENYLEPHRINE HCL-NACL 20-0.9 MG/250ML-% IV SOLN
INTRAVENOUS | Status: AC
Start: 2022-01-06 — End: ?
  Filled 2022-01-06: qty 750

## 2022-01-06 MED ORDER — PROPOFOL 1000 MG/100ML IV EMUL
INTRAVENOUS | Status: AC
  Filled 2022-01-06: qty 300

## 2022-01-06 NOTE — Anesthesia Postprocedure Evaluation (Signed)
Anesthesia Post Note  Patient: Anna Keller  Procedure(s) Performed: RIGHT SHOULDER REVERSE SHOULDER ARTHROPLASTY (Right: Shoulder)     Patient location during evaluation: PACU Anesthesia Type: Regional and General Level of consciousness: awake and alert, oriented and patient cooperative Pain management: pain level controlled Vital Signs Assessment: post-procedure vital signs reviewed and stable Respiratory status: spontaneous breathing, nonlabored ventilation and respiratory function stable Cardiovascular status: blood pressure returned to baseline and stable Postop Assessment: no apparent nausea or vomiting Anesthetic complications: no   No notable events documented.  Last Vitals:  Vitals:   01/05/22 1510 01/05/22 1525  BP: 94/72 99/75  Pulse: 81 85  Resp: (!) 21 20  Temp:  36.8 C  SpO2: 93% 90%    Last Pain:  Vitals:   01/05/22 1525  TempSrc:   PainSc: 0-No pain                 Pervis Hocking

## 2022-01-08 ENCOUNTER — Encounter (HOSPITAL_BASED_OUTPATIENT_CLINIC_OR_DEPARTMENT_OTHER): Payer: Self-pay | Admitting: Physical Therapy

## 2022-01-08 ENCOUNTER — Ambulatory Visit (HOSPITAL_BASED_OUTPATIENT_CLINIC_OR_DEPARTMENT_OTHER): Payer: 59 | Attending: Orthopaedic Surgery | Admitting: Physical Therapy

## 2022-01-08 DIAGNOSIS — Y999 Unspecified external cause status: Secondary | ICD-10-CM | POA: Insufficient documentation

## 2022-01-08 DIAGNOSIS — S46011A Strain of muscle(s) and tendon(s) of the rotator cuff of right shoulder, initial encounter: Secondary | ICD-10-CM | POA: Insufficient documentation

## 2022-01-08 DIAGNOSIS — Y939 Activity, unspecified: Secondary | ICD-10-CM | POA: Insufficient documentation

## 2022-01-08 DIAGNOSIS — Y929 Unspecified place or not applicable: Secondary | ICD-10-CM | POA: Insufficient documentation

## 2022-01-08 NOTE — Therapy (Signed)
OUTPATIENT PHYSICAL THERAPY SHOULDER EVALUATION   Patient Name: Anna Keller MRN: [id number] DOB:May 10, 1970, 51 y.o.,, female Today's Date: 01/08/2022   PT End of Session - 01/08/22 1234     Visit Number 1    Number of Visits 25    Date for PT Re-Evaluation 04/02/22    Authorization Type [employer]    PT Start Time 1230    PT Stop Time 1310    PT Time Calculation (min) 40 min    Activity Tolerance Patient tolerated treatment well    Behavior During Therapy WFL for tasks assessed/performed             Past Medical History:  Diagnosis Date   Ankylosing spondylitis ([place])    Anxiety    Arthritis    Depression    Eczema    Headache    Rheumatoid arthritis ([place])    Past Surgical History:  Procedure Laterality Date   BLADDER SUSPENSION N/A 04/14/2021   Procedure: TRANSVAGINAL TAPE (TVT) PROCEDURE;  Surgeon: Sanjuana Kava, MD;  Location: [place];  Service: Gynecology;  Laterality: N/A;  GYNCARE TVT EXACT   CYSTOSCOPY N/A 04/14/2021   Procedure: CYSTOSCOPY;  Surgeon: Sanjuana Kava, MD;  Location: [place];  Service: Gynecology;  Laterality: N/A;   KNEE ARTHROSCOPY Left 1988   REVERSE SHOULDER ARTHROPLASTY Right 01/05/2022   Procedure: RIGHT SHOULDER REVERSE SHOULDER ARTHROPLASTY;  Surgeon: Vanetta Mulders, MD;  Location: [place];  Service: Orthopedics;  Laterality: Right;   TONSILLECTOMY     as a child   TOTAL LAPAROSCOPIC HYSTERECTOMY WITH SALPINGECTOMY Bilateral 04/14/2021   Procedure: TOTAL LAPAROSCOPIC HYSTERECTOMY WITH BILATERAL SALPINGECTOMY AND BILATERAL OOPHERECTOMY;  Surgeon: Sanjuana Kava, MD;  Location: [place];  Service: Gynecology;  Laterality: Bilateral;   Patient Active Problem List   Diagnosis Date Noted   Traumatic complete tear of right rotator cuff    Fibroids 04/14/2021   S/P laparoscopic hysterectomy 04/14/2021   Grief reaction 03/23/2019   Gastroesophageal reflux disease without esophagitis 03/23/2019   Constipation 03/23/2019   Eczema 03/23/2019   Fibroid  uterus 10/31/2018   Arthralgia 07/14/2018   Family history of arthritis 07/14/2018   Peeling skin 07/14/2018   Class 3 severe obesity due to excess calories with body mass index (BMI) of 40.0 to 44.9 in adult [facility]) 07/14/2018   Bright red rectal bleeding 07/14/2018   Anxiety 07/14/2018   Dysthymia 07/14/2018     REFERRING PROVIDER: Vanetta Mulders, MD  REFERRING DIAG: S46.011A (ICD-10-CM) - Traumatic complete tear of right rotator cuff, initial encounter Right R.TSA with biceps tenodesis, NO subscap repair  THERAPY DIAG:  Traumatic complete tear of right rotator cuff, initial encounter  Rationale for Evaluation and Treatment Rehabilitation  ONSET DATE: DOS 01/05/22  SUBJECTIVE:  SUBJECTIVE STATEMENT: Overall feeling ok.   PERTINENT HISTORY: RA  PAIN:  Are you having pain? Yes: NPRS scale: 2/10 Pain location: Rt shoulder Pain description: ache Aggravating factors: movement Relieving factors: rest  PRECAUTIONS: Shoulder  WEIGHT BEARING RESTRICTIONS Yes RTSA  FALLS:  Has patient fallen in last 6 months? Yes. 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  PLOF: Independent  PATIENT GOALS decr pain, use arm (Rt hand dominant)  OBJECTIVE:   PATIENT SURVEYS:  FOTO 35  COGNITION:  Overall cognitive status: Within functional limits for tasks assessed     SENSATION: WFL  POSTURE: Fwd rounded shoulder as expected post op  UPPER EXTREMITY ROM:   Passive ROM Right eval   Shoulder flexion 60   Shoulder extension    Shoulder abduction    Shoulder adduction    Shoulder internal rotation    Shoulder external rotation    Elbow flexion    Elbow extension    Wrist flexion    Wrist extension    Wrist ulnar deviation    Wrist radial  deviation    Wrist pronation    Wrist supination    (Blank rows = not tested)  UPPER EXTREMITY MMT:  MMT Right  Left   Shoulder flexion    Shoulder extension    Shoulder abduction    Shoulder adduction    Shoulder internal rotation    Shoulder external rotation    Middle trapezius    Lower trapezius    Elbow flexion    Elbow extension    Wrist flexion    Wrist extension    Wrist ulnar deviation    Wrist radial deviation    Wrist pronation    Wrist supination    Grip strength (lbs)    (Blank rows = not tested)   PALPATION:  Tightness along upper traps, movement of shoulder is spongy at end ranges and improved as we went   TODAY'S TREATMENT:  MANUAL PROM into flexion Scap retraction Upper trap stretch Pendulum AAROM elbow flexion/extension Putty grip   PATIENT EDUCATION: Education details: Geophysicist/field seismologist of condition, POC, HEP, exercise form/rationale Person educated: Patient Education method: Consulting civil engineer, Demonstration, Tactile cues, Verbal cues, and Handouts Education comprehension: verbalized understanding, returned demonstration, verbal cues required, tactile cues required, and needs further education   HOME EXERCISE PROGRAM: [id number]  ASSESSMENT:  CLINICAL IMPRESSION: Patient is a 51 y.o. F who was seen today for physical therapy evaluation and treatment for s/p Rt Reverse TSA without subscap repair on 01/05/2022. Pt does have full-body pain connected to RA and reports MD told her to sleep upright for 5-7d after surgery. She has a lot of pain in her Left knee that causes it to give out and encouraged her to be aware so she does not fall.    OBJECTIVE IMPAIRMENTS decreased activity tolerance, decreased mobility, decreased ROM, decreased strength, increased edema, increased muscle spasms, impaired flexibility, impaired UE functional use, improper body mechanics, postural dysfunction, and pain.   ACTIVITY LIMITATIONS carrying, lifting, transfers, bed mobility,  dressing, and hygiene/grooming  PARTICIPATION LIMITATIONS: meal prep, cleaning, laundry, driving, shopping, community activity, and occupation  [place] and 1-2 comorbidities: RA, obesity  are also affecting patient's functional outcome.   REHAB POTENTIAL: Good  CLINICAL DECISION MAKING: Stable/uncomplicated  EVALUATION COMPLEXITY: Low   GOALS: Goals reviewed with patient? Yes  SHORT TERM GOALS: Target date: 01/29/22  1.  Active ER to 30 deg in standing without shoulder elevation  Baseline: unable at eval Goal status: INITIAL  2.  AROM Elevation to at least 90 deg with good scapular control Baseline: see chart Goal status: INITIAL  3.  Passive flexion to 130 Baseline: see chart Goal status: INITIAL  4.  Able to demo full elbow AROM without incr pain Baseline: elbow and wrist motions begun at eval Goal status: INITIAL    LONG TERM GOALS:   Fire all heads of deltoid Baseline: unable at eval Goal status: INITIAL Target date: 02/05/2022  (4wk)  2.  Active shoulder elevation to 130 Baseline: see chart Goal status: INITIAL Target date: 02/19/2022  (6wk)  3.  Average pain <=3/10 in daily activities out of sling, aware of when to take rest breaks for pain managemenet Baseline: in sling at eval Goal status: INITIAL Target date: 02/19/2022  (6wk)  4.  Demo ability to reach light objects, such as empty dishes, into cabinets above shoulder height with involved UE Baseline: unable at eval Goal status: INITIAL Target date: 03/19/2022  (10 wk)  5.  Able to progress weight lifting tolerance to 10lb pain <=3/10 Baseline: will progress as appropriate Goal status: INITIAL Target date: 03/19/2022  (10 wk)  6.  Able to demo at least 75% strength via hand held dynamometry testing in straight plane motions compared to opp UE Baseline: unable to test at eval Goal status: INITIAL Target date: 04/02/2022  (12 wk)  7.  Able to return to work without  limitations Baseline: unable at eval Goal status: INITIAL Target date: 04/02/2022  (12 wk)   PLAN: PT FREQUENCY: 1-2x/week  PT DURATION: 12 weeks  PLANNED INTERVENTIONS: Therapeutic exercises, Therapeutic activity, Neuromuscular re-education, Patient/Family education, Self Care, Joint mobilization, Aquatic Therapy, Dry Needling, Electrical stimulation, Spinal mobilization, Cryotherapy, Moist heat, scar mobilization, Taping, Vasopneumatic device, Manual therapy, and Re-evaluation  PLAN FOR NEXT SESSION: continue per protocol  Konnor Vondrasek C. Jenasis Straley PT, DPT 01/08/22 1:53 PM

## 2022-01-12 DIAGNOSIS — F909 Attention-deficit hyperactivity disorder, unspecified type: Secondary | ICD-10-CM | POA: Diagnosis not present

## 2022-01-12 DIAGNOSIS — R69 Illness, unspecified: Secondary | ICD-10-CM | POA: Diagnosis not present

## 2022-01-12 DIAGNOSIS — F411 Generalized anxiety disorder: Secondary | ICD-10-CM | POA: Diagnosis not present

## 2022-01-15 ENCOUNTER — Ambulatory Visit (HOSPITAL_BASED_OUTPATIENT_CLINIC_OR_DEPARTMENT_OTHER): Payer: 59 | Admitting: Physical Therapy

## 2022-01-15 ENCOUNTER — Encounter (HOSPITAL_BASED_OUTPATIENT_CLINIC_OR_DEPARTMENT_OTHER): Payer: Self-pay | Admitting: Physical Therapy

## 2022-01-15 DIAGNOSIS — Y929 Unspecified place or not applicable: Secondary | ICD-10-CM | POA: Diagnosis not present

## 2022-01-15 DIAGNOSIS — Y999 Unspecified external cause status: Secondary | ICD-10-CM | POA: Diagnosis not present

## 2022-01-15 DIAGNOSIS — S46011A Strain of muscle(s) and tendon(s) of the rotator cuff of right shoulder, initial encounter: Secondary | ICD-10-CM | POA: Diagnosis not present

## 2022-01-15 DIAGNOSIS — Y939 Activity, unspecified: Secondary | ICD-10-CM | POA: Diagnosis not present

## 2022-01-15 NOTE — Therapy (Signed)
OUTPATIENT PHYSICAL THERAPY SHOULDER TREATMENT   Patient Name: Anna Keller MRN: [id number] DOB:[birth year], 51 y.o., female Today's Date: 01/15/2022   PT End of Session - 01/15/22 1057     Visit Number 2    Number of Visits 25    Date for PT Re-Evaluation 04/02/22    Authorization Type [employer]    PT Start Time 1057    PT Stop Time 1140    PT Time Calculation (min) 43 min    Activity Tolerance Patient tolerated treatment well    Behavior During Therapy WFL for tasks assessed/performed             Past Medical History:  Diagnosis Date   Ankylosing spondylitis ([place])    Anxiety    Arthritis    Depression    Eczema    Headache    Rheumatoid arthritis ([place])    Past Surgical History:  Procedure Laterality Date   BLADDER SUSPENSION N/A 04/14/2021   Procedure: TRANSVAGINAL TAPE (TVT) PROCEDURE;  Surgeon: Sanjuana Kava, MD;  Location: [place];  Service: Gynecology;  Laterality: N/A;  GYNCARE TVT EXACT   CYSTOSCOPY N/A 04/14/2021   Procedure: CYSTOSCOPY;  Surgeon: Sanjuana Kava, MD;  Location: [place];  Service: Gynecology;  Laterality: N/A;   KNEE ARTHROSCOPY Left 1988   REVERSE SHOULDER ARTHROPLASTY Right 01/05/2022   Procedure: RIGHT SHOULDER REVERSE SHOULDER ARTHROPLASTY;  Surgeon: Vanetta Mulders, MD;  Location: [place];  Service: Orthopedics;  Laterality: Right;   TONSILLECTOMY     as a child   TOTAL LAPAROSCOPIC HYSTERECTOMY WITH SALPINGECTOMY Bilateral 04/14/2021   Procedure: TOTAL LAPAROSCOPIC HYSTERECTOMY WITH BILATERAL SALPINGECTOMY AND BILATERAL OOPHERECTOMY;  Surgeon: Sanjuana Kava, MD;  Location: [place];  Service: Gynecology;  Laterality: Bilateral;   Patient Active Problem List   Diagnosis Date Noted   Traumatic complete tear of right rotator cuff    Fibroids 04/14/2021   S/P laparoscopic hysterectomy 04/14/2021   Grief reaction 03/23/2019   Gastroesophageal reflux disease without esophagitis 03/23/2019   Constipation 03/23/2019   Eczema 03/23/2019   Fibroid  uterus 10/31/2018   Arthralgia 07/14/2018   Family history of arthritis 07/14/2018   Peeling skin 07/14/2018   Class 3 severe obesity due to excess calories with body mass index (BMI) of 40.0 to 44.9 in adult [facility]) 07/14/2018   Bright red rectal bleeding 07/14/2018   Anxiety 07/14/2018   Dysthymia 07/14/2018     REFERRING PROVIDER: Vanetta Mulders, MD  REFERRING DIAG: S46.011A (ICD-10-CM) - Traumatic complete tear of right rotator cuff, initial encounter Right R.TSA with biceps tenodesis, NO subscap repair  THERAPY DIAG:  Traumatic complete tear of right rotator cuff, initial encounter  Rationale for Evaluation and Treatment Rehabilitation  ONSET DATE: DOS 01/05/22  SUBJECTIVE:  SUBJECTIVE STATEMENT: It's alright. It's not pain, just ache.  PERTINENT HISTORY: RA  PAIN:  Are you having pain? Yes: NPRS scale: 0/10 Pain location: Rt shoulder Pain description: ache Aggravating factors: movement Relieving factors: rest  PRECAUTIONS: Shoulder  WEIGHT BEARING RESTRICTIONS Yes RTSA  FALLS:  Has patient fallen in last 6 months? Yes. 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  PLOF: Independent  PATIENT GOALS decr pain, use arm (Rt hand dominant)  OBJECTIVE:   PATIENT SURVEYS:  FOTO 35  POSTURE: Fwd rounded shoulder as expected post op  UPPER EXTREMITY ROM:   Passive ROM Right eval   Shoulder flexion 60   Shoulder extension    Shoulder abduction    Shoulder adduction    Shoulder internal rotation    Shoulder external rotation    Elbow flexion    Elbow extension    Wrist flexion    Wrist extension    Wrist ulnar deviation    Wrist radial deviation    Wrist pronation    Wrist supination    (Blank rows = not  tested)  UPPER EXTREMITY MMT:  MMT Right  Left   Shoulder flexion    Shoulder extension    Shoulder abduction    Shoulder adduction    Shoulder internal rotation    Shoulder external rotation    Middle trapezius    Lower trapezius    Elbow flexion    Elbow extension    Wrist flexion    Wrist extension    Wrist ulnar deviation    Wrist radial deviation    Wrist pronation    Wrist supination    Grip strength (lbs)    (Blank rows = not tested)   PALPATION:  Tightness along upper traps, movement of shoulder is spongy at end ranges and improved as we went   TODAY'S TREATMENT:  MANUAL: STM periscapular (edu on use of tennis ball), PROM shoulder Supine flexion with bar- unable to tolerate Wall ladder Towel slides Biceps curls Scap retraction- required tactile cues Upper trap stretch  PATIENT EDUCATION: Education details: Anatomy of condition, POC, HEP, exercise form/rationale Person educated: Patient Education method: Consulting civil engineer, Demonstration, Tactile cues, Verbal cues, and Handouts Education comprehension: verbalized understanding, returned demonstration, verbal cues required, tactile cues required, and needs further education   HOME EXERCISE PROGRAM: [id number]  ASSESSMENT:  CLINICAL IMPRESSION: Easily able to achieve passive flexion to 90 deg with puling in periscap region. Had some pain in returning to neutral as she had difficulty relaxing for passive motion. Too much pain in opp shoulder for AAROM in supine so utilized finger ladder.    OBJECTIVE IMPAIRMENTS decreased activity tolerance, decreased mobility, decreased ROM, decreased strength, increased edema, increased muscle spasms, impaired flexibility, impaired UE functional use, improper body mechanics, postural dysfunction, and pain.   ACTIVITY LIMITATIONS carrying, lifting, transfers, bed mobility, dressing, and hygiene/grooming  PARTICIPATION LIMITATIONS: meal prep, cleaning, laundry, driving, shopping,  community activity, and occupation  [place] and 1-2 comorbidities: RA, obesity  are also affecting patient's functional outcome.   REHAB POTENTIAL: Good  CLINICAL DECISION MAKING: Stable/uncomplicated  EVALUATION COMPLEXITY: Low   GOALS: Goals reviewed with patient? Yes  SHORT TERM GOALS: Target date: 01/29/22  1.  Active ER to 30 deg in standing without shoulder elevation Baseline: unable at eval Goal status: INITIAL  2.  AROM Elevation to at least 90 deg with good scapular control Baseline: see chart Goal status: INITIAL  3.  Passive flexion to 130 Baseline: see chart Goal status: INITIAL  4.  Able to demo full elbow AROM without incr pain Baseline: elbow and wrist motions begun at eval Goal status: INITIAL    LONG TERM GOALS:   Fire all heads of deltoid Baseline: unable at eval Goal status: INITIAL Target date: 02/05/2022  (4wk)  2.  Active shoulder elevation to 130 Baseline: see chart Goal status: INITIAL Target date: 02/19/2022  (6wk)  3.  Average pain <=3/10 in daily activities out of sling, aware of when to take rest breaks for pain managemenet Baseline: in sling at eval Goal status: INITIAL Target date: 02/19/2022  (6wk)  4.  Demo ability to reach light objects, such as empty dishes, into cabinets above shoulder height with involved UE Baseline: unable at eval Goal status: INITIAL Target date: 03/19/2022  (10 wk)  5.  Able to progress weight lifting tolerance to 10lb pain <=3/10 Baseline: will progress as appropriate Goal status: INITIAL Target date: 03/19/2022  (10 wk)  6.  Able to demo at least 75% strength via hand held dynamometry testing in straight plane motions compared to opp UE Baseline: unable to test at eval Goal status: INITIAL Target date: 04/02/2022  (12 wk)  7.  Able to return to work without limitations Baseline: unable at eval Goal status: INITIAL Target date: 04/02/2022  (12 wk)   PLAN: PT FREQUENCY:  1-2x/week  PT DURATION: 12 weeks  PLANNED INTERVENTIONS: Therapeutic exercises, Therapeutic activity, Neuromuscular re-education, Patient/Family education, Self Care, Joint mobilization, Aquatic Therapy, Dry Needling, Electrical stimulation, Spinal mobilization, Cryotherapy, Moist heat, scar mobilization, Taping, Vasopneumatic device, Manual therapy, and Re-evaluation  PLAN FOR NEXT SESSION: continue per protocol  Foster Frericks C. Donny Heffern PT, DPT 01/15/22 11:44 AM

## 2022-01-18 ENCOUNTER — Ambulatory Visit (INDEPENDENT_AMBULATORY_CARE_PROVIDER_SITE_OTHER): Payer: 59

## 2022-01-18 ENCOUNTER — Ambulatory Visit (INDEPENDENT_AMBULATORY_CARE_PROVIDER_SITE_OTHER): Payer: 59 | Admitting: Orthopaedic Surgery

## 2022-01-18 DIAGNOSIS — Z96611 Presence of right artificial shoulder joint: Secondary | ICD-10-CM | POA: Diagnosis not present

## 2022-01-18 DIAGNOSIS — Z471 Aftercare following joint replacement surgery: Secondary | ICD-10-CM | POA: Diagnosis not present

## 2022-01-18 DIAGNOSIS — S46011A Strain of muscle(s) and tendon(s) of the rotator cuff of right shoulder, initial encounter: Secondary | ICD-10-CM

## 2022-01-18 NOTE — Progress Notes (Signed)
Post Operative Evaluation    Procedure/Date of Surgery: Right reverse shoulder arthroplasty 01/05/22  Interval History:   Presents today for follow-up status right shoulder reverse arthroplasty 2 weeks prior.  He did unfortunately have a fall onto the shoulder the night prior.  She states that she is having some soreness since this time.  She has been working in physical therapy multiple times weekly.  She does continue to have left knee pain and instability for which she is seeing Dr. Ninfa Linden.   PMH/PSH/Family History/Social History/Meds/Allergies:    Past Medical History:  Diagnosis Date   Ankylosing spondylitis ([place])    Anxiety    Arthritis    Depression    Eczema    Headache    Rheumatoid arthritis ([place])    Past Surgical History:  Procedure Laterality Date   BLADDER SUSPENSION N/A 04/14/2021   Procedure: TRANSVAGINAL TAPE (TVT) PROCEDURE;  Surgeon: Sanjuana Kava, MD;  Location: [facility];  Service: Gynecology;  Laterality: N/A;  GYNCARE TVT EXACT   CYSTOSCOPY N/A 04/14/2021   Procedure: CYSTOSCOPY;  Surgeon: Sanjuana Kava, MD;  Location: [facility];  Service: Gynecology;  Laterality: N/A;   KNEE ARTHROSCOPY Left 1988   REVERSE SHOULDER ARTHROPLASTY Right 01/05/2022   Procedure: RIGHT SHOULDER REVERSE SHOULDER ARTHROPLASTY;  Surgeon: Vanetta Mulders, MD;  Location: [facility];  Service: Orthopedics;  Laterality: Right;   TONSILLECTOMY     as a child   TOTAL LAPAROSCOPIC HYSTERECTOMY WITH SALPINGECTOMY Bilateral 04/14/2021   Procedure: TOTAL LAPAROSCOPIC HYSTERECTOMY WITH BILATERAL SALPINGECTOMY AND BILATERAL OOPHERECTOMY;  Surgeon: Sanjuana Kava, MD;  Location: [facility];  Service: Gynecology;  Laterality: Bilateral;   Social History   Socioeconomic History   Marital status: Married    Spouse name: Legrand Como   Number of children: 1   Years of education: 12   Highest education level: High school graduate  Occupational History   Not on file  Tobacco Use    Smoking status: Former    Packs/day: 1.00    Years: 25.00    Total pack years: 25.00    Types: Cigarettes    Quit date: 01/09/2016    Years since quitting: 6.0   Smokeless tobacco: Never  Vaping Use   Vaping Use: Never used  Substance and Sexual Activity   Alcohol use: Not Currently   Drug use: No   Sexual activity: Yes    Partners: Male    Birth control/protection: None  Other Topics Concern   Not on file  Social History Narrative   Not on file   Social Determinants of Health   Financial Resource Strain: Low Risk  (07/14/2018)   Overall Financial Resource Strain (CARDIA)    Difficulty of Paying Living Expenses: Not hard at all  Food Insecurity: No Food Insecurity (07/14/2018)   Hunger Vital Sign    Worried About Running Out of Food in the Last Year: Never true    [place] in the Last Year: Never true  Transportation Needs: No Transportation Needs (07/14/2018)   PRAPARE - Hydrologist (Medical): No    Lack of Transportation (Non-Medical): No  Physical Activity: Inactive (07/14/2018)   Exercise Vital Sign    Days of Exercise per Week: 0 days    Minutes of Exercise per Session: 0 min  Stress: No Stress  Concern Present (07/14/2018)   [place]    Feeling of Stress : Only a little  Social Connections: Somewhat Isolated (07/14/2018)   Social Connection and Isolation Panel [NHANES]    Frequency of Communication with Friends and Family: More than three times a week    Frequency of Social Gatherings with Friends and Family: More than three times a week    Attends Religious Services: Never    Marine scientist or Organizations: No    Attends Music therapist: Never    Marital Status: Married   Family History  Problem Relation Age of Onset   Hypertension Mother    Arthritis Father    Heart disease Sister    Pulmonary Hypertension Sister    COPD Sister     Diabetes Sister    Breast cancer Maternal Grandmother    Heart attack Paternal Grandmother    Breast cancer Maternal Aunt    Breast cancer Cousin    No Known Allergies Current Outpatient Medications  Medication Sig Dispense Refill   aspirin EC 325 MG tablet Take 1 tablet (325 mg total) by mouth daily. 30 tablet 0   albuterol (VENTOLIN HFA) 108 (90 Base) MCG/ACT inhaler Inhale 2 puffs into the lungs every 6 (six) hours as needed for wheezing or shortness of breath. 1 each 0   ALPRAZolam (XANAX) 1 MG tablet Take 1 mg by mouth 3 (three) times daily.     amphetamine-dextroamphetamine (ADDERALL) 20 MG tablet Take 20 mg by mouth 2 (two) times daily.     busPIRone (BUSPAR) 7.5 MG tablet Take 7.5 mg by mouth 3 (three) times daily.     Calcium Carb-Cholecalciferol (CALCIUM 600+D3 PO) Take 1-2 tablets by mouth See admin instructions. 2 tabs in the morning, 1 tab in the evening     celecoxib (CELEBREX) 200 MG capsule Take 200 mg by mouth 2 (two) times daily.     cholecalciferol (VITAMIN D3) 25 MCG (1000 UNIT) tablet Take 1,000 Units by mouth daily.     clobetasol cream (TEMOVATE) 6.43 % Apply 1 application  topically daily as needed (irritation).     levocetirizine (XYZAL) 5 MG tablet Take 5 mg by mouth every evening.     Multiple Vitamins-Minerals (MULTIVITAMIN WITH MINERALS) tablet Take 1 tablet by mouth daily.     omeprazole (PRILOSEC) 20 MG capsule Take 1 capsule by mouth once daily 90 capsule 0   oxybutynin (DITROPAN-XL) 10 MG 24 hr tablet Take 10 mg by mouth daily.     oxyCODONE (OXY IR/ROXICODONE) 5 MG immediate release tablet Take 1 tablet (5 mg total) by mouth every 6 (six) hours as needed (moderate to severe pain). 20 tablet 0   polyethylene glycol powder (GLYCOLAX/MIRALAX) 17 GM/SCOOP powder Take 17 g by mouth daily. 3350 g 3   rosuvastatin (CRESTOR) 40 MG tablet Take 40 mg by mouth daily.     traZODone (DESYREL) 150 MG tablet Take by mouth at bedtime as needed for sleep.     venlafaxine XR  (EFFEXOR-XR) 150 MG 24 hr capsule Take 1 capsule (150 mg total) by mouth daily. (Patient taking differently: Take 300 mg by mouth daily with breakfast.) 60 capsule 6   No current facility-administered medications for this visit.   No results found.  Review of Systems:   A ROS was performed including pertinent positives and negatives as documented in the HPI.   Musculoskeletal Exam:      Right shoulder incision is  well-appearing.  There is no erythema or drainage.  Passive forward elevation in the spine position is to 120 degrees.  She is able to actively achieve 90 degrees without pain.  External rotation at the side actively is to 30 degrees.  Internal rotation deferred.  Remainder of distal neurosensory exam is intact.  Imaging:    3 views right shoulder: Status post right shoulder reverse shoulder arthroplasty without evidence of complication  I personally reviewed and interpreted the radiographs.   Assessment:   2 weeks status post right shoulder reverse shoulder arthroplasty overall doing well.  She will continue to progress according to the reverse shoulder arthroplasty protocol.  With regard to the left knee she is having pain and swelling with perceived instability and as result I have provided her with a hinged knee brace prior to her further consultation with Dr. Ninfa Linden.  Plan :    -She will return to clinic in 4 weeks for reassessment      I personally saw and evaluated the patient, and participated in the management and treatment plan.  Vanetta Mulders, MD Attending Physician, Orthopedic Surgery  This document was dictated using Dragon voice recognition software. A reasonable attempt at proof reading has been made to minimize errors.

## 2022-01-19 ENCOUNTER — Encounter (HOSPITAL_BASED_OUTPATIENT_CLINIC_OR_DEPARTMENT_OTHER): Payer: Self-pay | Admitting: Orthopaedic Surgery

## 2022-01-21 DIAGNOSIS — M0609 Rheumatoid arthritis without rheumatoid factor, multiple sites: Secondary | ICD-10-CM | POA: Diagnosis not present

## 2022-01-21 DIAGNOSIS — Z79899 Other long term (current) drug therapy: Secondary | ICD-10-CM | POA: Diagnosis not present

## 2022-01-21 DIAGNOSIS — M2550 Pain in unspecified joint: Secondary | ICD-10-CM | POA: Diagnosis not present

## 2022-01-21 DIAGNOSIS — M7989 Other specified soft tissue disorders: Secondary | ICD-10-CM | POA: Diagnosis not present

## 2022-01-21 DIAGNOSIS — M1990 Unspecified osteoarthritis, unspecified site: Secondary | ICD-10-CM | POA: Diagnosis not present

## 2022-01-21 DIAGNOSIS — L309 Dermatitis, unspecified: Secondary | ICD-10-CM | POA: Diagnosis not present

## 2022-01-21 DIAGNOSIS — M1712 Unilateral primary osteoarthritis, left knee: Secondary | ICD-10-CM | POA: Diagnosis not present

## 2022-01-21 DIAGNOSIS — R21 Rash and other nonspecific skin eruption: Secondary | ICD-10-CM | POA: Diagnosis not present

## 2022-01-21 DIAGNOSIS — M79643 Pain in unspecified hand: Secondary | ICD-10-CM | POA: Diagnosis not present

## 2022-01-22 ENCOUNTER — Encounter (HOSPITAL_BASED_OUTPATIENT_CLINIC_OR_DEPARTMENT_OTHER): Payer: Self-pay

## 2022-01-22 ENCOUNTER — Encounter (HOSPITAL_BASED_OUTPATIENT_CLINIC_OR_DEPARTMENT_OTHER): Payer: 59 | Admitting: Physical Therapy

## 2022-01-27 ENCOUNTER — Ambulatory Visit (INDEPENDENT_AMBULATORY_CARE_PROVIDER_SITE_OTHER): Payer: 59

## 2022-01-27 ENCOUNTER — Encounter: Payer: Self-pay | Admitting: Orthopaedic Surgery

## 2022-01-27 ENCOUNTER — Ambulatory Visit (INDEPENDENT_AMBULATORY_CARE_PROVIDER_SITE_OTHER): Payer: 59 | Admitting: Orthopaedic Surgery

## 2022-01-27 VITALS — Ht 66.0 in | Wt 282.6 lb

## 2022-01-27 DIAGNOSIS — G8929 Other chronic pain: Secondary | ICD-10-CM | POA: Diagnosis not present

## 2022-01-27 DIAGNOSIS — Z6841 Body Mass Index (BMI) 40.0 and over, adult: Secondary | ICD-10-CM | POA: Diagnosis not present

## 2022-01-27 DIAGNOSIS — M25562 Pain in left knee: Secondary | ICD-10-CM | POA: Diagnosis not present

## 2022-01-27 DIAGNOSIS — M1712 Unilateral primary osteoarthritis, left knee: Secondary | ICD-10-CM

## 2022-01-27 NOTE — Progress Notes (Signed)
The patient is a 51 year old female well-known to [place].  She has debilitating track of arthritis of her left knee.  She has been wearing a knee brace and has had a fall recently.  She has had hyaluronic acid injection in that knee in August and has not really helped her at all.  X-rays again of her knee today show tricompartment arthritis of the left knee.  There is varus malalignment with significant medial and patellofemoral narrowing as well as osteophytes in all 3 compartments.  Clinical exam she hurts globally without left knee with there is no effusion or redness.  She has good range of motion but is very painful given the severity of her arthritis.  Her BMI today is 45.61.  She understands that we are not in a position where we can schedule her for knee replacement even though she has had a recent shoulder replacement this is done well.  Unfortunately the rules are different as it relates to [facility] and total hips and total knees and BMI.  I would like to refer her to a weight loss clinic to see if that could help in any way with losing some weight to help her qualify for knee replacement.  She is only 53 and I think she is going to certainly need this for better quality of life.

## 2022-01-27 NOTE — Addendum Note (Signed)
Addended by: Robyne Peers on: 01/27/2022 01:44 PM   Modules accepted: Orders

## 2022-01-29 ENCOUNTER — Encounter (HOSPITAL_BASED_OUTPATIENT_CLINIC_OR_DEPARTMENT_OTHER): Payer: Self-pay | Admitting: Physical Therapy

## 2022-02-01 ENCOUNTER — Ambulatory Visit: Payer: 59 | Admitting: Urology

## 2022-02-01 ENCOUNTER — Encounter: Payer: Self-pay | Admitting: Urology

## 2022-02-01 VITALS — BP 116/81 | HR 81 | Ht 66.0 in | Wt 280.0 lb

## 2022-02-01 DIAGNOSIS — N3281 Overactive bladder: Secondary | ICD-10-CM

## 2022-02-01 MED ORDER — GEMTESA 75 MG PO TABS: 75.0000 mg | ORAL_TABLET | Freq: Every day | ORAL | 0 refills | Status: DC

## 2022-02-01 NOTE — Progress Notes (Signed)
02/01/2022 2:51 PM   KASHAYLA UNGERER 1970/05/26 [id number]  Referring provider: Danelle Berry, NP [address] [place],  [place] [address]  Chief Complaint  Patient presents with   Over Active Bladder    HPI: Anna Keller is a 51 y.o. female referred for evaluation of overactive bladder.  Status post laparoscopic hysterectomy/BSO and mid urethral sling with TVT for uterine fibroids and stress urinary incontinence Her stress incontinence resolved postoperatively however she has developed bothersome urinary frequency, urgency with urge incontinence.  She notes occasional burning sensation with urination No gross hematuria Has been treated with oxybutynin XL 10 mg without improvement.  Was given prescriptions for Myrbetriq and Logan Bores however this was not covered by her insurance.  She has not been able to try.    PMH: Past Medical History:  Diagnosis Date   Ankylosing spondylitis ([place])    Anxiety    Arthritis    Depression    Eczema    Headache    Rheumatoid arthritis ([place])     Surgical History: Past Surgical History:  Procedure Laterality Date   BLADDER SUSPENSION N/A 04/14/2021   Procedure: TRANSVAGINAL TAPE (TVT) PROCEDURE;  Surgeon: Sanjuana Kava, MD;  Location: [facility];  Service: Gynecology;  Laterality: N/A;  GYNCARE TVT EXACT   CYSTOSCOPY N/A 04/14/2021   Procedure: CYSTOSCOPY;  Surgeon: Sanjuana Kava, MD;  Location: [facility];  Service: Gynecology;  Laterality: N/A;   KNEE ARTHROSCOPY Left 1988   REVERSE SHOULDER ARTHROPLASTY Right 01/05/2022   Procedure: RIGHT SHOULDER REVERSE SHOULDER ARTHROPLASTY;  Surgeon: Vanetta Mulders, MD;  Location: [facility];  Service: Orthopedics;  Laterality: Right;   TONSILLECTOMY     as a child   TOTAL LAPAROSCOPIC HYSTERECTOMY WITH SALPINGECTOMY Bilateral 04/14/2021   Procedure: TOTAL LAPAROSCOPIC HYSTERECTOMY WITH BILATERAL SALPINGECTOMY AND BILATERAL OOPHERECTOMY;  Surgeon: Sanjuana Kava, MD;  Location: [facility];  Service:  Gynecology;  Laterality: Bilateral;    Home Medications:  Allergies as of 02/01/2022   No Known Allergies      Medication List        Accurate as of February 01, 2022  2:51 PM. If you have any questions, ask your nurse or doctor.          STOP taking these medications    aspirin EC 325 MG tablet Stopped by: Abbie Sons, MD   celecoxib 200 MG capsule Commonly known as: CELEBREX Stopped by: Abbie Sons, MD   oxyCODONE 5 MG immediate release tablet Commonly known as: Oxy IR/ROXICODONE Stopped by: Abbie Sons, MD       TAKE these medications    albuterol 108 (90 Base) MCG/ACT inhaler Commonly known as: VENTOLIN HFA Inhale 2 puffs into the lungs every 6 (six) hours as needed for wheezing or shortness of breath.   ALPRAZolam 1 MG tablet Commonly known as: XANAX Take 1 mg by mouth 3 (three) times daily.   amphetamine-dextroamphetamine 20 MG tablet Commonly known as: ADDERALL Take 20 mg by mouth 2 (two) times daily.   busPIRone 7.5 MG tablet Commonly known as: BUSPAR Take 7.5 mg by mouth 3 (three) times daily.   CALCIUM 600+D3 PO Take 1-2 tablets by mouth See admin instructions. 2 tabs in the morning, 1 tab in the evening   cholecalciferol 25 MCG (1000 UNIT) tablet Commonly known as: VITAMIN D3 Take 1,000 Units by mouth daily.   clobetasol cream 0.05 % Commonly known as: TEMOVATE Apply 1 application  topically daily as needed (irritation).   levocetirizine 5  MG tablet Commonly known as: XYZAL Take 5 mg by mouth every evening.   multivitamin with minerals tablet Take 1 tablet by mouth daily.   omeprazole 20 MG capsule Commonly known as: PRILOSEC Take 1 capsule by mouth once daily   oxybutynin 10 MG 24 hr tablet Commonly known as: DITROPAN-XL Take 10 mg by mouth daily.   polyethylene glycol powder 17 GM/SCOOP powder Commonly known as: GLYCOLAX/MIRALAX Take 17 g by mouth daily.   rosuvastatin 40 MG tablet Commonly known as:  CRESTOR Take 40 mg by mouth daily.   traZODone 150 MG tablet Commonly known as: DESYREL Take by mouth at bedtime as needed for sleep.   venlafaxine XR 150 MG 24 hr capsule Commonly known as: EFFEXOR-XR Take 1 capsule (150 mg total) by mouth daily. What changed:  how much to take when to take this        Allergies: No Known Allergies  Family History: Family History  Problem Relation Age of Onset   Hypertension Mother    Arthritis Father    Heart disease Sister    Pulmonary Hypertension Sister    COPD Sister    Diabetes Sister    Breast cancer Maternal Grandmother    Heart attack Paternal Grandmother    Breast cancer Maternal Aunt    Breast cancer Cousin     Social History:  reports that she quit smoking about 6 years ago. Her smoking use included cigarettes. She has a 25.00 pack-year smoking history. She has never used smokeless tobacco. She reports that she does not currently use alcohol. She reports that she does not use drugs.   Physical Exam: BP 116/81   Pulse 81   Ht '5\' 6"'$  (1.676 m)   Wt 280 lb (127 kg)   LMP 03/24/2021   BMI 45.19 kg/m   Constitutional:  Alert and oriented, No acute distress. HEENT: [place] AT Respiratory: Normal respiratory effort, no increased work of breathing. Psychiatric: Normal mood and affect.  Laboratory Data:  Urinalysis Dipstick 1+ leukocytes/microscopy 6-10 WBC   Assessment & Plan:    1. Overactive bladder We discussed a small percentage of patients status post pubovaginal sling will develop de novo storage related voiding symptoms She was given sample of Gemtesa samples times 4 weeks UA today with pyuria and a urine culture was ordered If UA negative recommend cystoscopy Follow-up 4 weeks for symptom recheck and possible cystoscopy based on urine culture results    Abbie Sons, MD  [facility] [address], [place], [place] [address] [phone]

## 2022-02-02 LAB — URINALYSIS, COMPLETE
Bilirubin, UA: NEGATIVE
Glucose, UA: NEGATIVE
Nitrite, UA: NEGATIVE
Protein,UA: NEGATIVE
RBC, UA: NEGATIVE
Specific Gravity, UA: 1.015 (ref 1.005–1.030)
Urobilinogen, Ur: 0.2 mg/dL (ref 0.2–1.0)
pH, UA: 6 (ref 5.0–7.5)

## 2022-02-02 LAB — MICROSCOPIC EXAMINATION

## 2022-02-04 ENCOUNTER — Telehealth: Payer: Self-pay | Admitting: *Deleted

## 2022-02-04 LAB — CULTURE, URINE COMPREHENSIVE

## 2022-02-04 NOTE — Telephone Encounter (Signed)
-----   Message from Abbie Sons, MD sent at 02/04/2022  9:29 AM EDT ----- Urine culture showed no evidence of infection.  Change follow-up appointment 1 month to cystoscopy (if still having symptoms).  Will also need repeat UA

## 2022-02-04 NOTE — Telephone Encounter (Signed)
Notified patient as instructed, patient pleased. No symptoms at this time.

## 2022-02-05 DIAGNOSIS — M25561 Pain in right knee: Secondary | ICD-10-CM | POA: Diagnosis not present

## 2022-02-11 ENCOUNTER — Encounter (HOSPITAL_BASED_OUTPATIENT_CLINIC_OR_DEPARTMENT_OTHER): Payer: Self-pay | Admitting: Physical Therapy

## 2022-02-11 ENCOUNTER — Ambulatory Visit (HOSPITAL_BASED_OUTPATIENT_CLINIC_OR_DEPARTMENT_OTHER): Payer: 59 | Attending: Orthopaedic Surgery | Admitting: Physical Therapy

## 2022-02-11 DIAGNOSIS — S46011A Strain of muscle(s) and tendon(s) of the rotator cuff of right shoulder, initial encounter: Secondary | ICD-10-CM | POA: Insufficient documentation

## 2022-02-11 NOTE — Therapy (Signed)
OUTPATIENT PHYSICAL THERAPY SHOULDER TREATMENT   Patient Name: Anna Keller MRN: [id number] DOB:January 11, 1971, 51 y.o., female Today's Date: 02/11/2022   PT End of Session - 02/11/22 1517     Visit Number 3    Number of Visits 25    Date for PT Re-Evaluation 04/02/22    Authorization Type [employer]    PT Start Time 1515    PT Stop Time 1555    PT Time Calculation (min) 40 min    Activity Tolerance Patient tolerated treatment well    Behavior During Therapy WFL for tasks assessed/performed              Past Medical History:  Diagnosis Date   Ankylosing spondylitis ([place])    Anxiety    Arthritis    Depression    Eczema    Headache    Rheumatoid arthritis ([place])    Past Surgical History:  Procedure Laterality Date   BLADDER SUSPENSION N/A 04/14/2021   Procedure: TRANSVAGINAL TAPE (TVT) PROCEDURE;  Surgeon: Sanjuana Kava, MD;  Location: [facility];  Service: Gynecology;  Laterality: N/A;  GYNCARE TVT EXACT   CYSTOSCOPY N/A 04/14/2021   Procedure: CYSTOSCOPY;  Surgeon: Sanjuana Kava, MD;  Location: [facility];  Service: Gynecology;  Laterality: N/A;   KNEE ARTHROSCOPY Left 1988   REVERSE SHOULDER ARTHROPLASTY Right 01/05/2022   Procedure: RIGHT SHOULDER REVERSE SHOULDER ARTHROPLASTY;  Surgeon: Vanetta Mulders, MD;  Location: [facility];  Service: Orthopedics;  Laterality: Right;   TONSILLECTOMY     as a child   TOTAL LAPAROSCOPIC HYSTERECTOMY WITH SALPINGECTOMY Bilateral 04/14/2021   Procedure: TOTAL LAPAROSCOPIC HYSTERECTOMY WITH BILATERAL SALPINGECTOMY AND BILATERAL OOPHERECTOMY;  Surgeon: Sanjuana Kava, MD;  Location: [facility];  Service: Gynecology;  Laterality: Bilateral;   Patient Active Problem List   Diagnosis Date Noted   Unilateral primary osteoarthritis, left knee 01/27/2022   Traumatic complete tear of right rotator cuff    Fibroids 04/14/2021   S/P laparoscopic hysterectomy 04/14/2021   Grief reaction 03/23/2019   Gastroesophageal reflux disease without esophagitis 03/23/2019    Constipation 03/23/2019   Eczema 03/23/2019   Fibroid uterus 10/31/2018   Arthralgia 07/14/2018   Family history of arthritis 07/14/2018   Peeling skin 07/14/2018   Class 3 severe obesity due to excess calories with body mass index (BMI) of 40.0 to 44.9 in adult [facility]) 07/14/2018   Bright red rectal bleeding 07/14/2018   Anxiety 07/14/2018   Dysthymia 07/14/2018     REFERRING PROVIDER: Vanetta Mulders, MD  REFERRING DIAG: S46.011A (ICD-10-CM) - Traumatic complete tear of right rotator cuff, initial encounter Right R.TSA with biceps tenodesis, NO subscap repair  THERAPY DIAG:  Traumatic complete tear of right rotator cuff, initial encounter  Rationale for Evaluation and Treatment Rehabilitation  ONSET DATE: DOS 01/05/22  SUBJECTIVE:  SUBJECTIVE STATEMENT: Incision is very tender. I am doing laundry and folding sheets/laundry. Go back to [place] next week. Missed quite a few appts because of husbands appts. It has been 13 wk off of RA meds and I want to sit down and cry. I saw 2 surgeons for TKA and both say I need to lose weight first.   PERTINENT HISTORY: RA  PAIN:  Are you having pain? Yes: NPRS scale: 0/10 Pain location: Rt shoulder Pain description: ache Aggravating factors: movement Relieving factors: rest  PRECAUTIONS: Shoulder  WEIGHT BEARING RESTRICTIONS Yes RTSA  FALLS:  Has patient fallen in last 6 months? Yes. 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  PLOF: Independent  PATIENT GOALS decr pain, use arm (Rt hand dominant)  OBJECTIVE:   PATIENT SURVEYS:  FOTO 35  POSTURE: Fwd rounded shoulder as expected post op  UPPER EXTREMITY ROM:    AROM  Right eval Right  10/12  Shoulder flexion 60 (passive) 98  Shoulder  extension    Shoulder abduction  46  Shoulder adduction    Shoulder internal rotation    Shoulder external rotation    (Blank rows = not tested)  UPPER EXTREMITY MMT:  MMT Right  Left   Shoulder flexion    Shoulder extension    Shoulder abduction    Shoulder adduction    Shoulder internal rotation    Shoulder external rotation    Middle trapezius    Lower trapezius    Elbow flexion    Elbow extension    Wrist flexion    Wrist extension    Wrist ulnar deviation    Wrist radial deviation    Wrist pronation    Wrist supination    Grip strength (lbs)    (Blank rows = not tested)   PALPATION:  Tightness along upper traps, movement of shoulder is spongy at end ranges and improved as we went   TODAY'S TREATMENT:  Treatment                            02/11/22:  Seated flexion with wand- stopping at 90 with cues to reduce elevation Seated OH press with wand Supine ABCs Manual scar tissue massage & IASTM Isometric flx/ext/abd   PATIENT EDUCATION: Education details: Anatomy of condition, POC, HEP, exercise form/rationale Person educated: Patient Education method: Explanation, Demonstration, Tactile cues, Verbal cues, and Handouts Education comprehension: verbalized understanding, returned demonstration, verbal cues required, tactile cues required, and needs further education   HOME EXERCISE PROGRAM: [id number]  ASSESSMENT:  CLINICAL IMPRESSION: Improved ROM notable. Significant hiking of shoulder above 90 deg. TTP at incision site and pt reports her niece pulled a stitch out that had come through the skin. Incision appears well healed but pt notes it was very red and puffy not long ago.     OBJECTIVE IMPAIRMENTS decreased activity tolerance, decreased mobility, decreased ROM, decreased strength, increased edema, increased muscle spasms, impaired flexibility, impaired UE functional use, improper body mechanics, postural dysfunction, and pain.   ACTIVITY LIMITATIONS  carrying, lifting, transfers, bed mobility, dressing, and hygiene/grooming  PARTICIPATION LIMITATIONS: meal prep, cleaning, laundry, driving, shopping, community activity, and occupation  [place] and 1-2 comorbidities: RA, obesity  are also affecting patient's functional outcome.   REHAB POTENTIAL: Good  CLINICAL DECISION MAKING: Stable/uncomplicated  EVALUATION COMPLEXITY: Low   GOALS: Goals reviewed with patient? Yes  SHORT TERM GOALS: Target date:  01/29/22   1.  Active ER to 30 deg in standing without shoulder elevation Baseline: unable at eval Goal status: achieved  2.  AROM Elevation to at least 90 deg with good scapular control Baseline: see chart Goal status: achieved  3.  Passive flexion to 130 Baseline: see chart Goal status: achieved  4.  Able to demo full elbow AROM without incr pain Baseline: elbow and wrist motions begun at eval Goal status: achieved    LONG TERM GOALS:   Fire all heads of deltoid Baseline: unable at eval Goal status: INITIAL Target date: 02/05/2022  (4wk)  2.  Active shoulder elevation to 130 Baseline: see chart Goal status: INITIAL Target date: 02/19/2022  (6wk)  3.  Average pain <=3/10 in daily activities out of sling, aware of when to take rest breaks for pain managemenet Baseline: in sling at eval Goal status: INITIAL Target date: 02/19/2022  (6wk)  4.  Demo ability to reach light objects, such as empty dishes, into cabinets above shoulder height with involved UE Baseline: unable at eval Goal status: INITIAL Target date: 03/19/2022  (10 wk)  5.  Able to progress weight lifting tolerance to 10lb pain <=3/10 Baseline: will progress as appropriate Goal status: INITIAL Target date: 03/19/2022  (10 wk)  6.  Able to demo at least 75% strength via hand held dynamometry testing in straight plane motions compared to opp UE Baseline: unable to test at eval Goal status: INITIAL Target date: 04/02/2022  (12  wk)  7.  Able to return to work without limitations Baseline: unable at eval Goal status: INITIAL Target date: 04/02/2022  (12 wk)   PLAN: PT FREQUENCY: 1-2x/week  PT DURATION: 12 weeks  PLANNED INTERVENTIONS: Therapeutic exercises, Therapeutic activity, Neuromuscular re-education, Patient/Family education, Self Care, Joint mobilization, Aquatic Therapy, Dry Needling, Electrical stimulation, Spinal mobilization, Cryotherapy, Moist heat, scar mobilization, Taping, Vasopneumatic device, Manual therapy, and Re-evaluation  PLAN FOR NEXT SESSION: continue per protocol  Yohana Bartha C. Adrine Hayworth PT, DPT 02/11/22 5:29 PM

## 2022-02-15 ENCOUNTER — Ambulatory Visit (INDEPENDENT_AMBULATORY_CARE_PROVIDER_SITE_OTHER): Payer: 59 | Admitting: Orthopaedic Surgery

## 2022-02-15 DIAGNOSIS — S46011A Strain of muscle(s) and tendon(s) of the rotator cuff of right shoulder, initial encounter: Secondary | ICD-10-CM

## 2022-02-15 NOTE — Progress Notes (Signed)
Post Operative Evaluation    Procedure/Date of Surgery: Right reverse shoulder arthroplasty 01/05/22  Interval History:   6 weeks status post the above procedure.  She is continuing to improve this time.  She is having a very difficult time getting her RA medications approved by insurance and as result does have pain everywhere.  The right shoulder is actually one of the least painful joints.  She is considering bariatric surgery so that she can proceed with knee arthroplasty on the left knee.  PMH/PSH/Family History/Social History/Meds/Allergies:    Past Medical History:  Diagnosis Date   Ankylosing spondylitis ([place])    Anxiety    Arthritis    Depression    Eczema    Headache    Rheumatoid arthritis ([place])    Past Surgical History:  Procedure Laterality Date   BLADDER SUSPENSION N/A 04/14/2021   Procedure: TRANSVAGINAL TAPE (TVT) PROCEDURE;  Surgeon: Sanjuana Kava, MD;  Location: [facility];  Service: Gynecology;  Laterality: N/A;  GYNCARE TVT EXACT   CYSTOSCOPY N/A 04/14/2021   Procedure: CYSTOSCOPY;  Surgeon: Sanjuana Kava, MD;  Location: [facility];  Service: Gynecology;  Laterality: N/A;   KNEE ARTHROSCOPY Left 1988   REVERSE SHOULDER ARTHROPLASTY Right 01/05/2022   Procedure: RIGHT SHOULDER REVERSE SHOULDER ARTHROPLASTY;  Surgeon: Vanetta Mulders, MD;  Location: [facility];  Service: Orthopedics;  Laterality: Right;   TONSILLECTOMY     as a child   TOTAL LAPAROSCOPIC HYSTERECTOMY WITH SALPINGECTOMY Bilateral 04/14/2021   Procedure: TOTAL LAPAROSCOPIC HYSTERECTOMY WITH BILATERAL SALPINGECTOMY AND BILATERAL OOPHERECTOMY;  Surgeon: Sanjuana Kava, MD;  Location: [facility];  Service: Gynecology;  Laterality: Bilateral;   Social History   Socioeconomic History   Marital status: Married    Spouse name: Legrand Como   Number of children: 1   Years of education: 12   Highest education level: High school graduate  Occupational History   Not on file  Tobacco Use   Smoking  status: Former    Packs/day: 1.00    Years: 25.00    Total pack years: 25.00    Types: Cigarettes    Quit date: 01/09/2016    Years since quitting: 6.1   Smokeless tobacco: Never  Vaping Use   Vaping Use: Never used  Substance and Sexual Activity   Alcohol use: Not Currently   Drug use: No   Sexual activity: Yes    Partners: Male    Birth control/protection: None  Other Topics Concern   Not on file  Social History Narrative   Not on file   Social Determinants of Health   Financial Resource Strain: Low Risk  (07/14/2018)   Overall Financial Resource Strain (CARDIA)    Difficulty of Paying Living Expenses: Not hard at all  Food Insecurity: No Food Insecurity (07/14/2018)   Hunger Vital Sign    Worried About Running Out of Food in the Last Year: Never true    [place] in the Last Year: Never true  Transportation Needs: No Transportation Needs (07/14/2018)   PRAPARE - Hydrologist (Medical): No    Lack of Transportation (Non-Medical): No  Physical Activity: Inactive (07/14/2018)   Exercise Vital Sign    Days of Exercise per Week: 0 days    Minutes of Exercise per Session: 0 min  Stress: No Stress  Concern Present (07/14/2018)   Greasy    Feeling of Stress : Only a little  Social Connections: Somewhat Isolated (07/14/2018)   Social Connection and Isolation Panel [NHANES]    Frequency of Communication with Friends and Family: More than three times a week    Frequency of Social Gatherings with Friends and Family: More than three times a week    Attends Religious Services: Never    Marine scientist or Organizations: No    Attends Music therapist: Never    Marital Status: Married   Family History  Problem Relation Age of Onset   Hypertension Mother    Arthritis Father    Heart disease Sister    Pulmonary Hypertension Sister    COPD Sister    Diabetes  Sister    Breast cancer Maternal Grandmother    Heart attack Paternal Grandmother    Breast cancer Maternal Aunt    Breast cancer Cousin    No Known Allergies Current Outpatient Medications  Medication Sig Dispense Refill   albuterol (VENTOLIN HFA) 108 (90 Base) MCG/ACT inhaler Inhale 2 puffs into the lungs every 6 (six) hours as needed for wheezing or shortness of breath. 1 each 0   ALPRAZolam (XANAX) 1 MG tablet Take 1 mg by mouth 3 (three) times daily.     amphetamine-dextroamphetamine (ADDERALL) 20 MG tablet Take 20 mg by mouth 2 (two) times daily.     busPIRone (BUSPAR) 7.5 MG tablet Take 7.5 mg by mouth 3 (three) times daily.     Calcium Carb-Cholecalciferol (CALCIUM 600+D3 PO) Take 1-2 tablets by mouth See admin instructions. 2 tabs in the morning, 1 tab in the evening     cholecalciferol (VITAMIN D3) 25 MCG (1000 UNIT) tablet Take 1,000 Units by mouth daily.     clobetasol cream (TEMOVATE) 6.76 % Apply 1 application  topically daily as needed (irritation).     levocetirizine (XYZAL) 5 MG tablet Take 5 mg by mouth every evening.     Multiple Vitamins-Minerals (MULTIVITAMIN WITH MINERALS) tablet Take 1 tablet by mouth daily.     omeprazole (PRILOSEC) 20 MG capsule Take 1 capsule by mouth once daily 90 capsule 0   polyethylene glycol powder (GLYCOLAX/MIRALAX) 17 GM/SCOOP powder Take 17 g by mouth daily. 3350 g 3   rosuvastatin (CRESTOR) 40 MG tablet Take 40 mg by mouth daily.     traZODone (DESYREL) 150 MG tablet Take by mouth at bedtime as needed for sleep.     venlafaxine XR (EFFEXOR-XR) 150 MG 24 hr capsule Take 1 capsule (150 mg total) by mouth daily. (Patient taking differently: Take 300 mg by mouth daily with breakfast.) 60 capsule 6   Vibegron (GEMTESA) 75 MG TABS Take 75 mg by mouth daily. 28 tablet 0   No current facility-administered medications for this visit.   No results found.  Review of Systems:   A ROS was performed including pertinent positives and negatives as  documented in the HPI.   Musculoskeletal Exam:      Right shoulder incision is well-appearing.  There is no erythema or drainage.  In the supine position she is able to forward elevate to 120 degrees without any difficulty actively.  In the sitting position forward elevation is to 100 degrees actively.  External rotation actively at the side is to 60 degrees compared to 60 on the contralateral side.  Internal rotation is to back pocket compared to L1 on the contralateral.  Imaging:    3 views right shoulder: Status post right shoulder reverse shoulder arthroplasty without evidence of complication  I personally reviewed and interpreted the radiographs.   Assessment:   6 weeks status post right shoulder reverse shoulder arthroplasty overall doing well.  At this time she will continue to progress according to the shoulder arthroplasty rehab protocol.  All limitations and restrictions were discussed.  She will be out of her sling full-time at this point.  Plan :    -She will return to clinic in 6 weeks for reassessment      I personally saw and evaluated the patient, and participated in the management and treatment plan.  Vanetta Mulders, MD Attending Physician, Orthopedic Surgery  This document was dictated using Dragon voice recognition software. A reasonable attempt at proof reading has been made to minimize errors.

## 2022-02-18 ENCOUNTER — Ambulatory Visit (HOSPITAL_BASED_OUTPATIENT_CLINIC_OR_DEPARTMENT_OTHER): Payer: 59 | Admitting: Physical Therapy

## 2022-02-18 ENCOUNTER — Encounter (HOSPITAL_BASED_OUTPATIENT_CLINIC_OR_DEPARTMENT_OTHER): Payer: Self-pay

## 2022-02-24 ENCOUNTER — Telehealth: Payer: Self-pay

## 2022-02-24 NOTE — Telephone Encounter (Signed)
Patient would like to try a different medication.  Not much relief with gemtesa.  Reviewed chart and left message for patient to call and schedule a follow up to discuss medication.

## 2022-02-25 ENCOUNTER — Ambulatory Visit (HOSPITAL_BASED_OUTPATIENT_CLINIC_OR_DEPARTMENT_OTHER): Payer: 59 | Admitting: Physical Therapy

## 2022-02-25 ENCOUNTER — Encounter (HOSPITAL_BASED_OUTPATIENT_CLINIC_OR_DEPARTMENT_OTHER): Payer: Self-pay

## 2022-03-01 ENCOUNTER — Ambulatory Visit (HOSPITAL_BASED_OUTPATIENT_CLINIC_OR_DEPARTMENT_OTHER): Payer: 59 | Admitting: Physical Therapy

## 2022-03-01 NOTE — Telephone Encounter (Signed)
Pt called office to let [facility] know Gemtesa wasn't working well and wants to know if she can get samples of Myrbetriq?  She understood [facility] would allow her to try different meds, without having another appt.

## 2022-03-02 MED ORDER — MIRABEGRON ER 50 MG PO TB24: 50.0000 mg | ORAL_TABLET | Freq: Every day | ORAL | 0 refills | Status: DC

## 2022-03-02 NOTE — Addendum Note (Signed)
Addended by: Chrystie Nose on: 03/02/2022 04:11 PM   Modules accepted: Orders

## 2022-03-02 NOTE — Telephone Encounter (Signed)
Okay to give Myrbetriq 50 mg x 28.  Please go ahead and schedule cystoscopy in 1 month.  Can cancel if symptoms improve on this medication

## 2022-03-02 NOTE — Telephone Encounter (Signed)
Notified patient as instructed, patient pleased. Discussed follow-up appointments, patient agrees  

## 2022-03-04 ENCOUNTER — Other Ambulatory Visit (HOSPITAL_COMMUNITY): Payer: Self-pay | Admitting: General Surgery

## 2022-03-04 DIAGNOSIS — R69 Illness, unspecified: Secondary | ICD-10-CM | POA: Diagnosis not present

## 2022-03-04 DIAGNOSIS — M159 Polyosteoarthritis, unspecified: Secondary | ICD-10-CM | POA: Diagnosis not present

## 2022-03-04 DIAGNOSIS — K219 Gastro-esophageal reflux disease without esophagitis: Secondary | ICD-10-CM

## 2022-03-04 DIAGNOSIS — E7800 Pure hypercholesterolemia, unspecified: Secondary | ICD-10-CM | POA: Diagnosis not present

## 2022-03-04 DIAGNOSIS — M069 Rheumatoid arthritis, unspecified: Secondary | ICD-10-CM | POA: Diagnosis not present

## 2022-03-11 ENCOUNTER — Ambulatory Visit (HOSPITAL_BASED_OUTPATIENT_CLINIC_OR_DEPARTMENT_OTHER): Payer: 59 | Admitting: Physical Therapy

## 2022-03-15 DIAGNOSIS — R69 Illness, unspecified: Secondary | ICD-10-CM | POA: Diagnosis not present

## 2022-03-15 DIAGNOSIS — F909 Attention-deficit hyperactivity disorder, unspecified type: Secondary | ICD-10-CM | POA: Diagnosis not present

## 2022-03-15 DIAGNOSIS — F411 Generalized anxiety disorder: Secondary | ICD-10-CM | POA: Diagnosis not present

## 2022-03-17 ENCOUNTER — Other Ambulatory Visit (HOSPITAL_BASED_OUTPATIENT_CLINIC_OR_DEPARTMENT_OTHER): Payer: Self-pay | Admitting: Orthopaedic Surgery

## 2022-03-17 ENCOUNTER — Ambulatory Visit (INDEPENDENT_AMBULATORY_CARE_PROVIDER_SITE_OTHER): Payer: 59

## 2022-03-17 DIAGNOSIS — Z9889 Other specified postprocedural states: Secondary | ICD-10-CM | POA: Diagnosis not present

## 2022-03-17 DIAGNOSIS — M1712 Unilateral primary osteoarthritis, left knee: Secondary | ICD-10-CM

## 2022-03-17 DIAGNOSIS — M25511 Pain in right shoulder: Secondary | ICD-10-CM | POA: Diagnosis not present

## 2022-03-18 ENCOUNTER — Ambulatory Visit (INDEPENDENT_AMBULATORY_CARE_PROVIDER_SITE_OTHER): Payer: 59

## 2022-03-18 ENCOUNTER — Other Ambulatory Visit (HOSPITAL_BASED_OUTPATIENT_CLINIC_OR_DEPARTMENT_OTHER): Payer: Self-pay | Admitting: Orthopaedic Surgery

## 2022-03-18 ENCOUNTER — Ambulatory Visit (INDEPENDENT_AMBULATORY_CARE_PROVIDER_SITE_OTHER): Payer: 59 | Admitting: Orthopaedic Surgery

## 2022-03-18 ENCOUNTER — Encounter (HOSPITAL_BASED_OUTPATIENT_CLINIC_OR_DEPARTMENT_OTHER): Payer: 59 | Admitting: Physical Therapy

## 2022-03-18 DIAGNOSIS — Z471 Aftercare following joint replacement surgery: Secondary | ICD-10-CM | POA: Diagnosis not present

## 2022-03-18 DIAGNOSIS — Z9889 Other specified postprocedural states: Secondary | ICD-10-CM | POA: Diagnosis not present

## 2022-03-18 DIAGNOSIS — M1712 Unilateral primary osteoarthritis, left knee: Secondary | ICD-10-CM

## 2022-03-18 DIAGNOSIS — Z96611 Presence of right artificial shoulder joint: Secondary | ICD-10-CM | POA: Diagnosis not present

## 2022-03-18 NOTE — Progress Notes (Signed)
Post Operative Evaluation    Procedure/Date of Surgery: Right reverse shoulder arthroplasty 01/05/22  Interval History:   Today for follow-up status post right shoulder reverse shoulder arthroplasty.  She states that the day before she fell a pop in the shoulder and felt like it shifted in and out.  She presented for an urgent x-ray and this was found to be reduced.  She here today for further assessment.  She has been in a sling since this happened yesterday.  She is having some tenderness in the anterior aspect of the shoulder.  PMH/PSH/Family History/Social History/Meds/Allergies:    Past Medical History:  Diagnosis Date   Ankylosing spondylitis ([place])    Anxiety    Arthritis    Depression    Eczema    Headache    Rheumatoid arthritis ([place])    Past Surgical History:  Procedure Laterality Date   BLADDER SUSPENSION N/A 04/14/2021   Procedure: TRANSVAGINAL TAPE (TVT) PROCEDURE;  Surgeon: Sanjuana Kava, MD;  Location: [facility];  Service: Gynecology;  Laterality: N/A;  GYNCARE TVT EXACT   CYSTOSCOPY N/A 04/14/2021   Procedure: CYSTOSCOPY;  Surgeon: Sanjuana Kava, MD;  Location: [facility];  Service: Gynecology;  Laterality: N/A;   KNEE ARTHROSCOPY Left 1988   REVERSE SHOULDER ARTHROPLASTY Right 01/05/2022   Procedure: RIGHT SHOULDER REVERSE SHOULDER ARTHROPLASTY;  Surgeon: Vanetta Mulders, MD;  Location: [facility];  Service: Orthopedics;  Laterality: Right;   TONSILLECTOMY     as a child   TOTAL LAPAROSCOPIC HYSTERECTOMY WITH SALPINGECTOMY Bilateral 04/14/2021   Procedure: TOTAL LAPAROSCOPIC HYSTERECTOMY WITH BILATERAL SALPINGECTOMY AND BILATERAL OOPHERECTOMY;  Surgeon: Sanjuana Kava, MD;  Location: [facility];  Service: Gynecology;  Laterality: Bilateral;   Social History   Socioeconomic History   Marital status: Married    Spouse name: Legrand Como   Number of children: 1   Years of education: 12   Highest education level: High school graduate  Occupational History    Not on file  Tobacco Use   Smoking status: Former    Packs/day: 1.00    Years: 25.00    Total pack years: 25.00    Types: Cigarettes    Quit date: 01/09/2016    Years since quitting: 6.1   Smokeless tobacco: Never  Vaping Use   Vaping Use: Never used  Substance and Sexual Activity   Alcohol use: Not Currently   Drug use: No   Sexual activity: Yes    Partners: Male    Birth control/protection: None  Other Topics Concern   Not on file  Social History Narrative   Not on file   Social Determinants of Health   Financial Resource Strain: Low Risk  (07/14/2018)   Overall Financial Resource Strain (CARDIA)    Difficulty of Paying Living Expenses: Not hard at all  Food Insecurity: No Food Insecurity (07/14/2018)   Hunger Vital Sign    Worried About Running Out of Food in the Last Year: Never true    [place] in the Last Year: Never true  Transportation Needs: No Transportation Needs (07/14/2018)   PRAPARE - Hydrologist (Medical): No    Lack of Transportation (Non-Medical): No  Physical Activity: Inactive (07/14/2018)   Exercise Vital Sign    Days of Exercise per Week: 0 days    Minutes  of Exercise per Session: 0 min  Stress: No Stress Concern Present (07/14/2018)   [place]    Feeling of Stress : Only a little  Social Connections: Somewhat Isolated (07/14/2018)   Social Connection and Isolation Panel [NHANES]    Frequency of Communication with Friends and Family: More than three times a week    Frequency of Social Gatherings with Friends and Family: More than three times a week    Attends Religious Services: Never    Marine scientist or Organizations: No    Attends Music therapist: Never    Marital Status: Married   Family History  Problem Relation Age of Onset   Hypertension Mother    Arthritis Father    Heart disease Sister    Pulmonary Hypertension  Sister    COPD Sister    Diabetes Sister    Breast cancer Maternal Grandmother    Heart attack Paternal Grandmother    Breast cancer Maternal Aunt    Breast cancer Cousin    No Known Allergies Current Outpatient Medications  Medication Sig Dispense Refill   albuterol (VENTOLIN HFA) 108 (90 Base) MCG/ACT inhaler Inhale 2 puffs into the lungs every 6 (six) hours as needed for wheezing or shortness of breath. 1 each 0   ALPRAZolam (XANAX) 1 MG tablet Take 1 mg by mouth 3 (three) times daily.     amphetamine-dextroamphetamine (ADDERALL) 20 MG tablet Take 20 mg by mouth 2 (two) times daily.     busPIRone (BUSPAR) 7.5 MG tablet Take 7.5 mg by mouth 3 (three) times daily.     Calcium Carb-Cholecalciferol (CALCIUM 600+D3 PO) Take 1-2 tablets by mouth See admin instructions. 2 tabs in the morning, 1 tab in the evening     cholecalciferol (VITAMIN D3) 25 MCG (1000 UNIT) tablet Take 1,000 Units by mouth daily.     clobetasol cream (TEMOVATE) 9.50 % Apply 1 application  topically daily as needed (irritation).     levocetirizine (XYZAL) 5 MG tablet Take 5 mg by mouth every evening.     Multiple Vitamins-Minerals (MULTIVITAMIN WITH MINERALS) tablet Take 1 tablet by mouth daily.     omeprazole (PRILOSEC) 20 MG capsule Take 1 capsule by mouth once daily 90 capsule 0   polyethylene glycol powder (GLYCOLAX/MIRALAX) 17 GM/SCOOP powder Take 17 g by mouth daily. 3350 g 3   rosuvastatin (CRESTOR) 40 MG tablet Take 40 mg by mouth daily.     traZODone (DESYREL) 150 MG tablet Take by mouth at bedtime as needed for sleep.     venlafaxine XR (EFFEXOR-XR) 150 MG 24 hr capsule Take 1 capsule (150 mg total) by mouth daily. (Patient taking differently: Take 300 mg by mouth daily with breakfast.) 60 capsule 6   Vibegron (GEMTESA) 75 MG TABS Take 75 mg by mouth daily. 28 tablet 0   No current facility-administered medications for this visit.   No results found.  Review of Systems:   A ROS was performed including  pertinent positives and negatives as documented in the HPI.   Musculoskeletal Exam:      Right shoulder incision is well-appearing.  There is no erythema or drainage.  Passive forward elevation in the supine position is to 90 degrees but not push past this.  Remainder of distal neurosensory exam is intact Imaging:    3 views right shoulder: Status post right shoulder reverse shoulder arthroplasty without evidence of complication  I personally reviewed and interpreted the  radiographs.   Assessment:   Status post right reverse shoulder arthroplasty with evidence of transient instability at this time.  At this time I recommended that she remain in a sling for an additional 2 weeks.  I will plan to see her back in 2 weeks for repeat x-rays.  At that time we will progress her range of motion.  I did discuss that should she have an additional instability episode that she should proceed with the emergency room and notify our office immediately so that we could tension admit her and discuss revision of the implant.  I did discuss the literature associated with instability.  Finally at this time she does not have any evidence of infectious type symptoms.  Plan :    -She will return to clinic in 2 weeks for recheck      I personally saw and evaluated the patient, and participated in the management and treatment plan.  Vanetta Mulders, MD Attending Physician, Orthopedic Surgery  This document was dictated using Dragon voice recognition software. A reasonable attempt at proof reading has been made to minimize errors.

## 2022-03-19 ENCOUNTER — Encounter (HOSPITAL_COMMUNITY): Payer: Self-pay

## 2022-03-19 ENCOUNTER — Encounter (HOSPITAL_BASED_OUTPATIENT_CLINIC_OR_DEPARTMENT_OTHER): Payer: 59 | Admitting: Orthopaedic Surgery

## 2022-03-19 ENCOUNTER — Other Ambulatory Visit (HOSPITAL_COMMUNITY): Payer: 59

## 2022-03-19 ENCOUNTER — Inpatient Hospital Stay (HOSPITAL_COMMUNITY)
Admission: RE | Admit: 2022-03-19 | Discharge: 2022-03-19 | Disposition: A | Discharge status: Home / Self Care | Payer: 59 | Source: Ambulatory Visit | Attending: General Surgery | Admitting: General Surgery

## 2022-03-22 ENCOUNTER — Ambulatory Visit (HOSPITAL_BASED_OUTPATIENT_CLINIC_OR_DEPARTMENT_OTHER): Payer: 59 | Admitting: Physical Therapy

## 2022-03-29 ENCOUNTER — Encounter (HOSPITAL_BASED_OUTPATIENT_CLINIC_OR_DEPARTMENT_OTHER): Payer: 59 | Admitting: Orthopaedic Surgery

## 2022-03-29 ENCOUNTER — Ambulatory Visit (INDEPENDENT_AMBULATORY_CARE_PROVIDER_SITE_OTHER): Payer: 59

## 2022-03-29 ENCOUNTER — Ambulatory Visit (INDEPENDENT_AMBULATORY_CARE_PROVIDER_SITE_OTHER): Payer: 59 | Admitting: Orthopaedic Surgery

## 2022-03-29 DIAGNOSIS — Z9889 Other specified postprocedural states: Secondary | ICD-10-CM

## 2022-03-29 DIAGNOSIS — Z01818 Encounter for other preprocedural examination: Secondary | ICD-10-CM | POA: Diagnosis not present

## 2022-03-29 NOTE — H&P (View-Only) (Signed)
Post Operative Evaluation    Procedure/Date of Surgery: Right reverse shoulder arthroplasty 01/05/22  Interval History:   03/29/2022:Anna Keller presents today for follow-up and unfortunately she has had 1 episode of recurrence where she felt like the shoulder popped out of her sleep.  At this time she is quite timid in terms of elevating the arm.  She states that she feels like in certain positions the shoulder does want to pop or give out.  Today for follow-up status post right shoulder reverse shoulder arthroplasty.  She states that the day before she fell a pop in the shoulder and felt like it shifted in and out.  She presented for an urgent x-ray and this was found to be reduced.  She here today for further assessment.  She has been in a sling since this happened yesterday.  She is having some tenderness in the anterior aspect of the shoulder.  PMH/PSH/Family History/Social History/Meds/Allergies:    Past Medical History:  Diagnosis Date   Ankylosing spondylitis ([place])    Anxiety    Arthritis    Depression    Eczema    Headache    Rheumatoid arthritis ([place])    Past Surgical History:  Procedure Laterality Date   BLADDER SUSPENSION N/A 04/14/2021   Procedure: TRANSVAGINAL TAPE (TVT) PROCEDURE;  Surgeon: Sanjuana Kava, MD;  Location: [place];  Service: Gynecology;  Laterality: N/A;  GYNCARE TVT EXACT   CYSTOSCOPY N/A 04/14/2021   Procedure: CYSTOSCOPY;  Surgeon: Sanjuana Kava, MD;  Location: [place];  Service: Gynecology;  Laterality: N/A;   KNEE ARTHROSCOPY Left 1988   REVERSE SHOULDER ARTHROPLASTY Right 01/05/2022   Procedure: RIGHT SHOULDER REVERSE SHOULDER ARTHROPLASTY;  Surgeon: Vanetta Mulders, MD;  Location: [place];  Service: Orthopedics;  Laterality: Right;   TONSILLECTOMY     as a child   TOTAL LAPAROSCOPIC HYSTERECTOMY WITH SALPINGECTOMY Bilateral 04/14/2021   Procedure: TOTAL LAPAROSCOPIC HYSTERECTOMY WITH BILATERAL SALPINGECTOMY AND BILATERAL  OOPHERECTOMY;  Surgeon: Sanjuana Kava, MD;  Location: [place];  Service: Gynecology;  Laterality: Bilateral;   Social History   Socioeconomic History   Marital status: Married    Spouse name: Legrand Como   Number of children: 1   Years of education: 12   Highest education level: High school graduate  Occupational History   Not on file  Tobacco Use   Smoking status: Former    Packs/day: 1.00    Years: 25.00    Total pack years: 25.00    Types: Cigarettes    Quit date: 01/09/2016    Years since quitting: 6.2   Smokeless tobacco: Never  Vaping Use   Vaping Use: Never used  Substance and Sexual Activity   Alcohol use: Not Currently   Drug use: No   Sexual activity: Yes    Partners: Male    Birth control/protection: None  Other Topics Concern   Not on file  Social History Narrative   Not on file   Social Determinants of Health   Financial Resource Strain: Low Risk  (07/14/2018)   Overall Financial Resource Strain (CARDIA)    Difficulty of Paying Living Expenses: Not hard at all  Food Insecurity: No Food Insecurity (07/14/2018)   Hunger Vital Sign    Worried About [place] in the Last Year: Never true    Ran Out of  Food in the Last Year: Never true  Transportation Needs: No Transportation Needs (07/14/2018)   PRAPARE - Hydrologist (Medical): No    Lack of Transportation (Non-Medical): No  Physical Activity: Inactive (07/14/2018)   Exercise Vital Sign    Days of Exercise per Week: 0 days    Minutes of Exercise per Session: 0 min  Stress: No Stress Concern Present (07/14/2018)   [place]    Feeling of Stress : Only a little  Social Connections: Somewhat Isolated (07/14/2018)   Social Connection and Isolation Panel [NHANES]    Frequency of Communication with Friends and Family: More than three times a week    Frequency of Social Gatherings with Friends and Family: More than  three times a week    Attends Religious Services: Never    Marine scientist or Organizations: No    Attends Music therapist: Never    Marital Status: Married   Family History  Problem Relation Age of Onset   Hypertension Mother    Arthritis Father    Heart disease Sister    Pulmonary Hypertension Sister    COPD Sister    Diabetes Sister    Breast cancer Maternal Grandmother    Heart attack Paternal Grandmother    Breast cancer Maternal Aunt    Breast cancer Cousin    No Known Allergies Current Outpatient Medications  Medication Sig Dispense Refill   albuterol (VENTOLIN HFA) 108 (90 Base) MCG/ACT inhaler Inhale 2 puffs into the lungs every 6 (six) hours as needed for wheezing or shortness of breath. 1 each 0   ALPRAZolam (XANAX) 1 MG tablet Take 1 mg by mouth 3 (three) times daily.     amphetamine-dextroamphetamine (ADDERALL) 20 MG tablet Take 20 mg by mouth 2 (two) times daily.     busPIRone (BUSPAR) 7.5 MG tablet Take 7.5 mg by mouth 3 (three) times daily.     Calcium Carb-Cholecalciferol (CALCIUM 600+D3 PO) Take 1-2 tablets by mouth See admin instructions. 2 tabs in the morning, 1 tab in the evening     cholecalciferol (VITAMIN D3) 25 MCG (1000 UNIT) tablet Take 1,000 Units by mouth daily.     clobetasol cream (TEMOVATE) 7.03 % Apply 1 application  topically daily as needed (irritation).     levocetirizine (XYZAL) 5 MG tablet Take 5 mg by mouth every evening.     mirabegron ER (MYRBETRIQ) 50 MG TB24 tablet Take 1 tablet (50 mg total) by mouth daily. 30 tablet 0   Multiple Vitamins-Minerals (MULTIVITAMIN WITH MINERALS) tablet Take 1 tablet by mouth daily.     omeprazole (PRILOSEC) 20 MG capsule Take 1 capsule by mouth once daily 90 capsule 0   polyethylene glycol powder (GLYCOLAX/MIRALAX) 17 GM/SCOOP powder Take 17 g by mouth daily. 3350 g 3   rosuvastatin (CRESTOR) 40 MG tablet Take 40 mg by mouth daily.     traZODone (DESYREL) 150 MG tablet Take by mouth at  bedtime as needed for sleep.     venlafaxine XR (EFFEXOR-XR) 150 MG 24 hr capsule Take 1 capsule (150 mg total) by mouth daily. (Patient taking differently: Take 300 mg by mouth daily with breakfast.) 60 capsule 6   No current facility-administered medications for this visit.   No results found.  Review of Systems:   A ROS was performed including pertinent positives and negatives as documented in the HPI.   Musculoskeletal Exam:  Right shoulder incision is well-appearing.  There is no erythema or drainage.  Range of motion deferred today due to positive apprehension from the patient.  Sensation is intact in all distributions of the right arm.  Fires EPL as well as wrist flexors and extensors.  Sensation is intact in axillary distribution and equal to the contralateral side.  She is able to fire all 3 heads of the deltoid.  Remainder of distal neurosensory exam is intact Imaging:    3 views right shoulder: Status post right shoulder reverse shoulder arthroplasty without evidence of complication  I personally reviewed and interpreted the radiographs.   Assessment:   Status post right reverse shoulder arthroplasty with evidence of transient instability at this time.  Given the fact that she has not had 2 episodes of this I do believe that unfortunately she is likely to keep experiencing recurrent instability.  That effect I did discuss that she would be a candidate for revision arthroplasty.  Specifically I would recommend upsizing her components.  I did also discuss suture fixation over the top of the implant to ensure stability.  With regard to instability I did discuss differential diagnosis with her and how infection can possibly cause this.  To that effect I did discuss that I would like to send her for lab work at today's visit so that we can further assess this.  I did describe that I would plan on taking cultures at the time of surgery and holding these for 14 days.  Should any of  these return positive she may need to be on a course of IV antibiotics for a longer duration.  That being said I will plan on discharging her with a 2-week prescription of doxycycline on discharge.  I did describe that without revision of the implant at this time given the fact that she has having recurrent instability even with basic activities like sleep that I am not hopeful of a good outcome without revision.  She understands this.  After full discussion of continued treatment options and physical therapy versus revision she is elected for right shoulder arthroplasty revision  Plan :    -Plan for right shoulder revision arthroplasty   After a lengthy discussion of treatment options, including risks, benefits, alternatives, complications of surgical and nonsurgical conservative options, the patient elected surgical repair.   The patient  is aware of the material risks  and complications including, but not limited to injury to adjacent structures, neurovascular injury, infection, numbness, bleeding, implant failure, thermal burns, stiffness, persistent pain, failure to heal, disease transmission from allograft, need for further surgery, dislocation, anesthetic risks, blood clots, risks of death,and others. The probabilities of surgical success and failure discussed with patient given their particular co-morbidities.The time and nature of expected rehabilitation and recovery was discussed.The patient's questions were all answered preoperatively.  No barriers to understanding were noted. I explained the natural history of the disease process and Rx rationale.  I explained to the patient what I considered to be reasonable expectations given their personal situation.  The final treatment plan was arrived at through a shared patient decision making process model.       I personally saw and evaluated the patient, and participated in the management and treatment plan.  Vanetta Mulders, MD Attending  Physician, Orthopedic Surgery  This document was dictated using Dragon voice recognition software. A reasonable attempt at proof reading has been made to minimize errors.

## 2022-03-29 NOTE — Progress Notes (Signed)
Post Operative Evaluation    Procedure/Date of Surgery: Right reverse shoulder arthroplasty 01/05/22  Interval History:   03/29/2022:Anna Keller presents today for follow-up and unfortunately she has had 1 episode of recurrence where she felt like the shoulder popped out of her sleep.  At this time she is quite timid in terms of elevating the arm.  She states that she feels like in certain positions the shoulder does want to pop or give out.  Today for follow-up status post right shoulder reverse shoulder arthroplasty.  She states that the day before she fell a pop in the shoulder and felt like it shifted in and out.  She presented for an urgent x-ray and this was found to be reduced.  She here today for further assessment.  She has been in a sling since this happened yesterday.  She is having some tenderness in the anterior aspect of the shoulder.  PMH/PSH/Family History/Social History/Meds/Allergies:    Past Medical History:  Diagnosis Date   Ankylosing spondylitis ([place])    Anxiety    Arthritis    Depression    Eczema    Headache    Rheumatoid arthritis ([place])    Past Surgical History:  Procedure Laterality Date   BLADDER SUSPENSION N/A 04/14/2021   Procedure: TRANSVAGINAL TAPE (TVT) PROCEDURE;  Surgeon: Sanjuana Kava, MD;  Location: [place];  Service: Gynecology;  Laterality: N/A;  GYNCARE TVT EXACT   CYSTOSCOPY N/A 04/14/2021   Procedure: CYSTOSCOPY;  Surgeon: Sanjuana Kava, MD;  Location: [place];  Service: Gynecology;  Laterality: N/A;   KNEE ARTHROSCOPY Left 1988   REVERSE SHOULDER ARTHROPLASTY Right 01/05/2022   Procedure: RIGHT SHOULDER REVERSE SHOULDER ARTHROPLASTY;  Surgeon: Vanetta Mulders, MD;  Location: [place];  Service: Orthopedics;  Laterality: Right;   TONSILLECTOMY     as a child   TOTAL LAPAROSCOPIC HYSTERECTOMY WITH SALPINGECTOMY Bilateral 04/14/2021   Procedure: TOTAL LAPAROSCOPIC HYSTERECTOMY WITH BILATERAL SALPINGECTOMY AND BILATERAL  OOPHERECTOMY;  Surgeon: Sanjuana Kava, MD;  Location: [place];  Service: Gynecology;  Laterality: Bilateral;   Social History   Socioeconomic History   Marital status: Married    Spouse name: Legrand Como   Number of children: 1   Years of education: 12   Highest education level: High school graduate  Occupational History   Not on file  Tobacco Use   Smoking status: Former    Packs/day: 1.00    Years: 25.00    Total pack years: 25.00    Types: Cigarettes    Quit date: 01/09/2016    Years since quitting: 6.2   Smokeless tobacco: Never  Vaping Use   Vaping Use: Never used  Substance and Sexual Activity   Alcohol use: Not Currently   Drug use: No   Sexual activity: Yes    Partners: Male    Birth control/protection: None  Other Topics Concern   Not on file  Social History Narrative   Not on file   Social Determinants of Health   Financial Resource Strain: Low Risk  (07/14/2018)   Overall Financial Resource Strain (CARDIA)    Difficulty of Paying Living Expenses: Not hard at all  Food Insecurity: No Food Insecurity (07/14/2018)   Hunger Vital Sign    Worried About [place] in the Last Year: Never true    Ran Out of  Food in the Last Year: Never true  Transportation Needs: No Transportation Needs (07/14/2018)   PRAPARE - Hydrologist (Medical): No    Lack of Transportation (Non-Medical): No  Physical Activity: Inactive (07/14/2018)   Exercise Vital Sign    Days of Exercise per Week: 0 days    Minutes of Exercise per Session: 0 min  Stress: No Stress Concern Present (07/14/2018)   [place]    Feeling of Stress : Only a little  Social Connections: Somewhat Isolated (07/14/2018)   Social Connection and Isolation Panel [NHANES]    Frequency of Communication with Friends and Family: More than three times a week    Frequency of Social Gatherings with Friends and Family: More than  three times a week    Attends Religious Services: Never    Marine scientist or Organizations: No    Attends Music therapist: Never    Marital Status: Married   Family History  Problem Relation Age of Onset   Hypertension Mother    Arthritis Father    Heart disease Sister    Pulmonary Hypertension Sister    COPD Sister    Diabetes Sister    Breast cancer Maternal Grandmother    Heart attack Paternal Grandmother    Breast cancer Maternal Aunt    Breast cancer Cousin    No Known Allergies Current Outpatient Medications  Medication Sig Dispense Refill   albuterol (VENTOLIN HFA) 108 (90 Base) MCG/ACT inhaler Inhale 2 puffs into the lungs every 6 (six) hours as needed for wheezing or shortness of breath. 1 each 0   ALPRAZolam (XANAX) 1 MG tablet Take 1 mg by mouth 3 (three) times daily.     amphetamine-dextroamphetamine (ADDERALL) 20 MG tablet Take 20 mg by mouth 2 (two) times daily.     busPIRone (BUSPAR) 7.5 MG tablet Take 7.5 mg by mouth 3 (three) times daily.     Calcium Carb-Cholecalciferol (CALCIUM 600+D3 PO) Take 1-2 tablets by mouth See admin instructions. 2 tabs in the morning, 1 tab in the evening     cholecalciferol (VITAMIN D3) 25 MCG (1000 UNIT) tablet Take 1,000 Units by mouth daily.     clobetasol cream (TEMOVATE) 5.78 % Apply 1 application  topically daily as needed (irritation).     levocetirizine (XYZAL) 5 MG tablet Take 5 mg by mouth every evening.     mirabegron ER (MYRBETRIQ) 50 MG TB24 tablet Take 1 tablet (50 mg total) by mouth daily. 30 tablet 0   Multiple Vitamins-Minerals (MULTIVITAMIN WITH MINERALS) tablet Take 1 tablet by mouth daily.     omeprazole (PRILOSEC) 20 MG capsule Take 1 capsule by mouth once daily 90 capsule 0   polyethylene glycol powder (GLYCOLAX/MIRALAX) 17 GM/SCOOP powder Take 17 g by mouth daily. 3350 g 3   rosuvastatin (CRESTOR) 40 MG tablet Take 40 mg by mouth daily.     traZODone (DESYREL) 150 MG tablet Take by mouth at  bedtime as needed for sleep.     venlafaxine XR (EFFEXOR-XR) 150 MG 24 hr capsule Take 1 capsule (150 mg total) by mouth daily. (Patient taking differently: Take 300 mg by mouth daily with breakfast.) 60 capsule 6   No current facility-administered medications for this visit.   No results found.  Review of Systems:   A ROS was performed including pertinent positives and negatives as documented in the HPI.   Musculoskeletal Exam:  Right shoulder incision is well-appearing.  There is no erythema or drainage.  Range of motion deferred today due to positive apprehension from the patient.  Sensation is intact in all distributions of the right arm.  Fires EPL as well as wrist flexors and extensors.  Sensation is intact in axillary distribution and equal to the contralateral side.  She is able to fire all 3 heads of the deltoid.  Remainder of distal neurosensory exam is intact Imaging:    3 views right shoulder: Status post right shoulder reverse shoulder arthroplasty without evidence of complication  I personally reviewed and interpreted the radiographs.   Assessment:   Status post right reverse shoulder arthroplasty with evidence of transient instability at this time.  Given the fact that she has not had 2 episodes of this I do believe that unfortunately she is likely to keep experiencing recurrent instability.  That effect I did discuss that she would be a candidate for revision arthroplasty.  Specifically I would recommend upsizing her components.  I did also discuss suture fixation over the top of the implant to ensure stability.  With regard to instability I did discuss differential diagnosis with her and how infection can possibly cause this.  To that effect I did discuss that I would like to send her for lab work at today's visit so that we can further assess this.  I did describe that I would plan on taking cultures at the time of surgery and holding these for 14 days.  Should any of  these return positive she may need to be on a course of IV antibiotics for a longer duration.  That being said I will plan on discharging her with a 2-week prescription of doxycycline on discharge.  I did describe that without revision of the implant at this time given the fact that she has having recurrent instability even with basic activities like sleep that I am not hopeful of a good outcome without revision.  She understands this.  After full discussion of continued treatment options and physical therapy versus revision she is elected for right shoulder arthroplasty revision  Plan :    -Plan for right shoulder revision arthroplasty   After a lengthy discussion of treatment options, including risks, benefits, alternatives, complications of surgical and nonsurgical conservative options, the patient elected surgical repair.   The patient  is aware of the material risks  and complications including, but not limited to injury to adjacent structures, neurovascular injury, infection, numbness, bleeding, implant failure, thermal burns, stiffness, persistent pain, failure to heal, disease transmission from allograft, need for further surgery, dislocation, anesthetic risks, blood clots, risks of death,and others. The probabilities of surgical success and failure discussed with patient given their particular co-morbidities.The time and nature of expected rehabilitation and recovery was discussed.The patient's questions were all answered preoperatively.  No barriers to understanding were noted. I explained the natural history of the disease process and Rx rationale.  I explained to the patient what I considered to be reasonable expectations given their personal situation.  The final treatment plan was arrived at through a shared patient decision making process model.       I personally saw and evaluated the patient, and participated in the management and treatment plan.  Vanetta Mulders, MD Attending  Physician, Orthopedic Surgery  This document was dictated using Dragon voice recognition software. A reasonable attempt at proof reading has been made to minimize errors.

## 2022-03-30 ENCOUNTER — Encounter (HOSPITAL_COMMUNITY): Payer: Self-pay | Admitting: Orthopaedic Surgery

## 2022-03-30 ENCOUNTER — Ambulatory Visit (HOSPITAL_BASED_OUTPATIENT_CLINIC_OR_DEPARTMENT_OTHER): Payer: Self-pay | Admitting: Orthopaedic Surgery

## 2022-03-30 ENCOUNTER — Other Ambulatory Visit: Payer: Self-pay

## 2022-03-30 DIAGNOSIS — Z9889 Other specified postprocedural states: Secondary | ICD-10-CM

## 2022-03-30 NOTE — Progress Notes (Signed)
EKG - 03/04/22 requested tracing, faxed request today    Sleep Apnea/CPAP - pt denies   Pt denies DM  Aspirin Instructions:pt denies   ERAS Protcol - yes   Anesthesia review: yes, requested EKG results   Patient verbally denies any shortness of breath, fever, cough and chest pain during phone call     -------------  SDW INSTRUCTIONS given:   Your procedure is scheduled on 11/30.             Report to [facility] Main Entrance "A" at 230 pm., and check in at the Admitting office.             Call this number if you have problems the morning of surgery:             [phone]               Remember:             Do not eat or drink after midnight the night before your surgery                          Take these medicines the morning of surgery with A SIP OF WATER xanax, Buspar, effexor, omeprazole  As of today, STOP taking any Aspirin (unless otherwise instructed by your surgeon) Aleve, Naproxen, Ibuprofen, Motrin, Advil, Goody's, BC's, all herbal medications, fish oil, and all vitamins.                        Do not wear jewelry, make up, or nail polish            Do not wear lotions, powders, perfumes/colognes, or deodorant.            Do not shave 48 hours prior to surgery.  Men may shave face and neck.            Do not bring valuables to the hospital.            [facility] is not responsible for any belongings or valuables.   Do NOT Smoke (Tobacco/Vaping) 24 hours prior to your procedure If you use a CPAP at night, you may bring all equipment for your overnight stay.   Contacts, glasses, dentures or partials may not be worn into surgery.      For patients admitted to the hospital, discharge time will be determined by your treatment team.   Patients discharged the day of surgery will not be allowed to drive home, and someone needs to stay with them for 24 hours.     Special instructions:   [place]- Preparing For Surgery     Oral Hygiene is also important  to reduce your risk of infection.  Remember - BRUSH YOUR TEETH THE MORNING OF SURGERY WITH YOUR REGULAR TOOTHPASTE   Please do not use if you have an allergy antibacterial soaps. If your skin becomes reddened/irritated stop using the CHG.  Do not shave (including legs and underarms) for at least 48 hours prior to first CHG shower. It is OK to shave your face.   Please follow these instructions carefully.              Shower the NIGHT BEFORE SURGERY and the MORNING OF SURGERY with DIAL Soap.    Pat yourself dry with a CLEAN TOWEL.   Wear CLEAN PAJAMAS to bed the night before surgery   Place CLEAN SHEETS  on your bed the night of your first shower and DO NOT SLEEP WITH PETS.     Day of Surgery: Please shower morning of surgery  Wear Clean/Comfortable clothing the morning of surgery Do not apply any deodorants/lotions.   Remember to brush your teeth WITH YOUR REGULAR TOOTHPASTE.   Questions were answered. Patient verbalized understanding of instructions.

## 2022-03-31 ENCOUNTER — Encounter (HOSPITAL_BASED_OUTPATIENT_CLINIC_OR_DEPARTMENT_OTHER): Payer: Self-pay | Admitting: Physical Therapy

## 2022-04-01 ENCOUNTER — Encounter (HOSPITAL_COMMUNITY)
Admission: RE | Disposition: A | Discharge status: Home / Self Care | Payer: Self-pay | Source: Ambulatory Visit | Attending: Orthopaedic Surgery

## 2022-04-01 ENCOUNTER — Inpatient Hospital Stay (HOSPITAL_COMMUNITY)
Admission: RE | Admit: 2022-04-01 | Discharge: 2022-04-02 | DRG: 483 | Disposition: A | Discharge status: Home / Self Care | Payer: 59 | Source: Ambulatory Visit | Attending: Orthopaedic Surgery | Admitting: Orthopaedic Surgery

## 2022-04-01 ENCOUNTER — Encounter (HOSPITAL_BASED_OUTPATIENT_CLINIC_OR_DEPARTMENT_OTHER): Payer: 59 | Admitting: Physical Therapy

## 2022-04-01 ENCOUNTER — Observation Stay (HOSPITAL_COMMUNITY): Payer: 59

## 2022-04-01 ENCOUNTER — Other Ambulatory Visit: Payer: Self-pay

## 2022-04-01 ENCOUNTER — Encounter (HOSPITAL_COMMUNITY): Payer: Self-pay | Admitting: Orthopaedic Surgery

## 2022-04-01 ENCOUNTER — Other Ambulatory Visit (HOSPITAL_BASED_OUTPATIENT_CLINIC_OR_DEPARTMENT_OTHER): Payer: Self-pay | Admitting: Orthopaedic Surgery

## 2022-04-01 ENCOUNTER — Other Ambulatory Visit: Payer: 59 | Admitting: Urology

## 2022-04-01 ENCOUNTER — Ambulatory Visit (HOSPITAL_BASED_OUTPATIENT_CLINIC_OR_DEPARTMENT_OTHER): Payer: 59 | Admitting: Vascular Surgery

## 2022-04-01 ENCOUNTER — Encounter (HOSPITAL_BASED_OUTPATIENT_CLINIC_OR_DEPARTMENT_OTHER): Payer: 59 | Admitting: Orthopaedic Surgery

## 2022-04-01 ENCOUNTER — Ambulatory Visit (HOSPITAL_COMMUNITY): Payer: 59 | Admitting: Vascular Surgery

## 2022-04-01 DIAGNOSIS — Z79899 Other long term (current) drug therapy: Secondary | ICD-10-CM | POA: Diagnosis not present

## 2022-04-01 DIAGNOSIS — M059 Rheumatoid arthritis with rheumatoid factor, unspecified: Secondary | ICD-10-CM | POA: Diagnosis not present

## 2022-04-01 DIAGNOSIS — M069 Rheumatoid arthritis, unspecified: Secondary | ICD-10-CM | POA: Diagnosis present

## 2022-04-01 DIAGNOSIS — Z9889 Other specified postprocedural states: Secondary | ICD-10-CM

## 2022-04-01 DIAGNOSIS — G8918 Other acute postprocedural pain: Secondary | ICD-10-CM | POA: Diagnosis not present

## 2022-04-01 DIAGNOSIS — M25311 Other instability, right shoulder: Secondary | ICD-10-CM

## 2022-04-01 DIAGNOSIS — Z6841 Body Mass Index (BMI) 40.0 and over, adult: Secondary | ICD-10-CM

## 2022-04-01 DIAGNOSIS — Z87891 Personal history of nicotine dependence: Secondary | ICD-10-CM | POA: Diagnosis not present

## 2022-04-01 DIAGNOSIS — M24411 Recurrent dislocation, right shoulder: Secondary | ICD-10-CM

## 2022-04-01 DIAGNOSIS — Z96611 Presence of right artificial shoulder joint: Secondary | ICD-10-CM | POA: Diagnosis not present

## 2022-04-01 DIAGNOSIS — R69 Illness, unspecified: Secondary | ICD-10-CM | POA: Diagnosis not present

## 2022-04-01 DIAGNOSIS — Z7982 Long term (current) use of aspirin: Secondary | ICD-10-CM

## 2022-04-01 DIAGNOSIS — Z471 Aftercare following joint replacement surgery: Secondary | ICD-10-CM | POA: Diagnosis not present

## 2022-04-01 DIAGNOSIS — T84028A Dislocation of other internal joint prosthesis, initial encounter: Secondary | ICD-10-CM

## 2022-04-01 HISTORY — DX: Attention-deficit hyperactivity disorder, unspecified type: F90.9

## 2022-04-01 HISTORY — DX: Family history of other specified conditions: Z84.89

## 2022-04-01 HISTORY — PX: REVERSE SHOULDER ARTHROPLASTY: SHX5054

## 2022-04-01 LAB — CBC
HCT: 40.3 % (ref 36.0–46.0)
Hemoglobin: 13 g/dL (ref 12.0–15.0)
MCH: 29.8 pg (ref 26.0–34.0)
MCHC: 32.3 g/dL (ref 30.0–36.0)
MCV: 92.4 fL (ref 80.0–100.0)
Platelets: 276 10*3/uL (ref 150–400)
RBC: 4.36 MIL/uL (ref 3.87–5.11)
RDW: 13.5 % (ref 11.5–15.5)
WBC: 6.6 10*3/uL (ref 4.0–10.5)
nRBC: 0 % (ref 0.0–0.2)

## 2022-04-01 SURGERY — ARTHROPLASTY, SHOULDER, TOTAL, REVERSE
Anesthesia: Regional | Site: Shoulder | Laterality: Right

## 2022-04-01 MED ORDER — SODIUM CHLORIDE 0.9 % IV SOLN: INTRAVENOUS | Status: DC

## 2022-04-01 MED ORDER — MIDAZOLAM HCL 2 MG/2ML IJ SOLN
INTRAMUSCULAR | Status: AC
  Administered 2022-04-01: 2 mg via INTRAVENOUS
  Filled 2022-04-01: qty 2

## 2022-04-01 MED ORDER — ALPRAZOLAM 0.5 MG PO TABS
1.0000 mg | ORAL_TABLET | Freq: Three times a day (TID) | ORAL | Status: DC | PRN
  Administered 2022-04-02: 1 mg via ORAL
  Filled 2022-04-01: qty 2

## 2022-04-01 MED ORDER — DEXAMETHASONE SODIUM PHOSPHATE 10 MG/ML IJ SOLN
INTRAMUSCULAR | Status: AC
  Filled 2022-04-01: qty 1

## 2022-04-01 MED ORDER — LEVOCETIRIZINE DIHYDROCHLORIDE 5 MG PO TABS: 5.0000 mg | ORAL_TABLET | Freq: Every evening | ORAL | Status: DC

## 2022-04-01 MED ORDER — ACETAMINOPHEN 500 MG PO TABS
1000.0000 mg | ORAL_TABLET | Freq: Four times a day (QID) | ORAL | Status: DC
  Administered 2022-04-02 (×2): 1000 mg via ORAL
  Filled 2022-04-01 (×2): qty 2

## 2022-04-01 MED ORDER — ONDANSETRON HCL 4 MG PO TABS: 4.0000 mg | ORAL_TABLET | Freq: Four times a day (QID) | ORAL | Status: DC | PRN

## 2022-04-01 MED ORDER — ACETAMINOPHEN 500 MG PO TABS
1000.0000 mg | ORAL_TABLET | Freq: Once | ORAL | Status: AC
  Administered 2022-04-01: 1000 mg via ORAL
  Filled 2022-04-01: qty 2

## 2022-04-01 MED ORDER — ACETAMINOPHEN 500 MG PO TABS: 1000.0000 mg | ORAL_TABLET | Freq: Once | ORAL | Status: DC

## 2022-04-01 MED ORDER — AMPHETAMINE-DEXTROAMPHETAMINE 10 MG PO TABS
20.0000 mg | ORAL_TABLET | Freq: Two times a day (BID) | ORAL | Status: DC
  Administered 2022-04-02: 20 mg via ORAL
  Filled 2022-04-01 (×2): qty 2

## 2022-04-01 MED ORDER — ONDANSETRON HCL 4 MG/2ML IJ SOLN
INTRAMUSCULAR | Status: DC | PRN
  Administered 2022-04-01: 4 mg via INTRAVENOUS

## 2022-04-01 MED ORDER — SUGAMMADEX SODIUM 500 MG/5ML IV SOLN
INTRAVENOUS | Status: AC
  Filled 2022-04-01: qty 5

## 2022-04-01 MED ORDER — POVIDONE-IODINE 10 % EX SOLN
CUTANEOUS | Status: DC | PRN
  Administered 2022-04-01: 1 via TOPICAL

## 2022-04-01 MED ORDER — ROCURONIUM BROMIDE 10 MG/ML (PF) SYRINGE
PREFILLED_SYRINGE | INTRAVENOUS | Status: DC | PRN
  Administered 2022-04-01: 100 mg via INTRAVENOUS

## 2022-04-01 MED ORDER — DOCUSATE SODIUM 100 MG PO CAPS
100.0000 mg | ORAL_CAPSULE | Freq: Two times a day (BID) | ORAL | Status: DC
  Administered 2022-04-01 – 2022-04-02 (×2): 100 mg via ORAL
  Filled 2022-04-01 (×2): qty 1

## 2022-04-01 MED ORDER — PROPOFOL 10 MG/ML IV BOLUS
INTRAVENOUS | Status: AC
  Filled 2022-04-01: qty 20

## 2022-04-01 MED ORDER — CELECOXIB 200 MG PO CAPS
200.0000 mg | ORAL_CAPSULE | Freq: Once | ORAL | Status: AC
  Administered 2022-04-01: 200 mg via ORAL
  Filled 2022-04-01: qty 1

## 2022-04-01 MED ORDER — SUGAMMADEX SODIUM 200 MG/2ML IV SOLN
INTRAVENOUS | Status: DC | PRN
  Administered 2022-04-01: 250 mg via INTRAVENOUS

## 2022-04-01 MED ORDER — ONDANSETRON HCL 4 MG/2ML IJ SOLN
INTRAMUSCULAR | Status: AC
  Filled 2022-04-01: qty 2

## 2022-04-01 MED ORDER — FENTANYL CITRATE (PF) 250 MCG/5ML IJ SOLN
INTRAMUSCULAR | Status: AC
  Filled 2022-04-01: qty 5

## 2022-04-01 MED ORDER — LACTATED RINGERS IV SOLN: INTRAVENOUS | Status: DC

## 2022-04-01 MED ORDER — LORATADINE 10 MG PO TABS
10.0000 mg | ORAL_TABLET | Freq: Every evening | ORAL | Status: DC
  Administered 2022-04-01: 10 mg via ORAL
  Filled 2022-04-01: qty 1

## 2022-04-01 MED ORDER — BUPIVACAINE HCL (PF) 0.5 % IJ SOLN
INTRAMUSCULAR | Status: DC | PRN
  Administered 2022-04-01: 15 mL via PERINEURAL

## 2022-04-01 MED ORDER — BUPIVACAINE LIPOSOME 1.3 % IJ SUSP
INTRAMUSCULAR | Status: DC | PRN
  Administered 2022-04-01: 10 mL via PERINEURAL

## 2022-04-01 MED ORDER — PROPOFOL 10 MG/ML IV BOLUS
INTRAVENOUS | Status: DC | PRN
  Administered 2022-04-01: 200 mg via INTRAVENOUS

## 2022-04-01 MED ORDER — OXYCODONE HCL 5 MG PO TABS: 5.0000 mg | ORAL_TABLET | ORAL | Status: DC | PRN

## 2022-04-01 MED ORDER — OXYCODONE HCL 5 MG PO TABS
10.0000 mg | ORAL_TABLET | ORAL | Status: DC | PRN
  Administered 2022-04-01 – 2022-04-02 (×4): 15 mg via ORAL
  Filled 2022-04-01 (×4): qty 3

## 2022-04-01 MED ORDER — SODIUM CHLORIDE 0.9 % IR SOLN
Status: DC | PRN
  Administered 2022-04-01: 3000 mL

## 2022-04-01 MED ORDER — TRAZODONE HCL 50 MG PO TABS: 150.0000 mg | ORAL_TABLET | Freq: Every evening | ORAL | Status: DC | PRN

## 2022-04-01 MED ORDER — FENTANYL CITRATE (PF) 100 MCG/2ML IJ SOLN
INTRAMUSCULAR | Status: AC
  Administered 2022-04-01: 100 ug via INTRAVENOUS
  Filled 2022-04-01: qty 2

## 2022-04-01 MED ORDER — POLYETHYLENE GLYCOL 3350 17 G PO PACK
17.0000 g | PACK | Freq: Every day | ORAL | Status: DC
  Administered 2022-04-02: 17 g via ORAL
  Filled 2022-04-01 (×2): qty 1

## 2022-04-01 MED ORDER — GABAPENTIN 300 MG PO CAPS
300.0000 mg | ORAL_CAPSULE | Freq: Once | ORAL | Status: AC
  Administered 2022-04-01: 300 mg via ORAL
  Filled 2022-04-01: qty 1

## 2022-04-01 MED ORDER — IRRISEPT - 450ML BOTTLE WITH 0.05% CHG IN STERILE WATER, USP 99.95% OPTIME
TOPICAL | Status: DC | PRN
  Administered 2022-04-01: 450 mL

## 2022-04-01 MED ORDER — FENTANYL CITRATE (PF) 100 MCG/2ML IJ SOLN: 25.0000 ug | INTRAMUSCULAR | Status: DC | PRN

## 2022-04-01 MED ORDER — TRANEXAMIC ACID-NACL 1000-0.7 MG/100ML-% IV SOLN
INTRAVENOUS | Status: AC
  Filled 2022-04-01: qty 100

## 2022-04-01 MED ORDER — BUPIVACAINE HCL (PF) 0.5 % IJ SOLN: INTRAMUSCULAR | Status: DC | PRN

## 2022-04-01 MED ORDER — TRANEXAMIC ACID-NACL 1000-0.7 MG/100ML-% IV SOLN
1000.0000 mg | INTRAVENOUS | Status: AC
  Administered 2022-04-01: 1000 mg via INTRAVENOUS
  Filled 2022-04-01: qty 100

## 2022-04-01 MED ORDER — ROSUVASTATIN CALCIUM 20 MG PO TABS
40.0000 mg | ORAL_TABLET | Freq: Every evening | ORAL | Status: DC
  Administered 2022-04-01: 40 mg via ORAL
  Filled 2022-04-01: qty 2

## 2022-04-01 MED ORDER — ONDANSETRON HCL 4 MG/2ML IJ SOLN: 4.0000 mg | Freq: Four times a day (QID) | INTRAMUSCULAR | Status: DC | PRN

## 2022-04-01 MED ORDER — BUSPIRONE HCL 5 MG PO TABS
7.5000 mg | ORAL_TABLET | Freq: Three times a day (TID) | ORAL | Status: DC
  Administered 2022-04-01 – 2022-04-02 (×2): 7.5 mg via ORAL
  Filled 2022-04-01 (×2): qty 2

## 2022-04-01 MED ORDER — VANCOMYCIN HCL 1000 MG IV SOLR
INTRAVENOUS | Status: AC
  Filled 2022-04-01: qty 20

## 2022-04-01 MED ORDER — PROMETHAZINE HCL 25 MG/ML IJ SOLN: 6.2500 mg | INTRAMUSCULAR | Status: DC | PRN

## 2022-04-01 MED ORDER — CHLORHEXIDINE GLUCONATE 0.12 % MT SOLN
15.0000 mL | Freq: Once | OROMUCOSAL | Status: AC
  Administered 2022-04-01: 15 mL via OROMUCOSAL
  Filled 2022-04-01: qty 15

## 2022-04-01 MED ORDER — FENTANYL CITRATE (PF) 250 MCG/5ML IJ SOLN
INTRAMUSCULAR | Status: DC | PRN
  Administered 2022-04-01: 100 ug via INTRAVENOUS
  Administered 2022-04-01: 50 ug via INTRAVENOUS

## 2022-04-01 MED ORDER — AMISULPRIDE (ANTIEMETIC) 5 MG/2ML IV SOLN: 10.0000 mg | Freq: Once | INTRAVENOUS | Status: DC | PRN

## 2022-04-01 MED ORDER — FENTANYL CITRATE (PF) 100 MCG/2ML IJ SOLN: 100.0000 ug | Freq: Once | INTRAMUSCULAR | Status: AC

## 2022-04-01 MED ORDER — LIDOCAINE 2% (20 MG/ML) 5 ML SYRINGE
INTRAMUSCULAR | Status: AC
  Filled 2022-04-01: qty 5

## 2022-04-01 MED ORDER — MIDAZOLAM HCL 2 MG/2ML IJ SOLN
INTRAMUSCULAR | Status: AC
  Filled 2022-04-01: qty 2

## 2022-04-01 MED ORDER — DEXAMETHASONE SODIUM PHOSPHATE 10 MG/ML IJ SOLN
INTRAMUSCULAR | Status: DC | PRN
  Administered 2022-04-01: 10 mg via INTRAVENOUS

## 2022-04-01 MED ORDER — PANTOPRAZOLE SODIUM 40 MG PO TBEC
40.0000 mg | DELAYED_RELEASE_TABLET | Freq: Every day | ORAL | Status: DC
  Administered 2022-04-02: 40 mg via ORAL
  Filled 2022-04-01: qty 1

## 2022-04-01 MED ORDER — MIDAZOLAM HCL 2 MG/2ML IJ SOLN: 2.0000 mg | Freq: Once | INTRAMUSCULAR | Status: AC

## 2022-04-01 MED ORDER — VANCOMYCIN HCL 1000 MG IV SOLR
INTRAVENOUS | Status: DC | PRN
  Administered 2022-04-01: 1000 mg

## 2022-04-01 MED ORDER — 0.9 % SODIUM CHLORIDE (POUR BTL) OPTIME
TOPICAL | Status: DC | PRN
  Administered 2022-04-01: 1000 mL

## 2022-04-01 MED ORDER — HYDROMORPHONE HCL 1 MG/ML IJ SOLN: 0.5000 mg | INTRAMUSCULAR | Status: DC | PRN

## 2022-04-01 MED ORDER — ORAL CARE MOUTH RINSE: 15.0000 mL | Freq: Once | OROMUCOSAL | Status: AC

## 2022-04-01 MED ORDER — CEFAZOLIN SODIUM-DEXTROSE 2-4 GM/100ML-% IV SOLN
2.0000 g | INTRAVENOUS | Status: AC
  Administered 2022-04-01: 3 g via INTRAVENOUS
  Filled 2022-04-01: qty 100

## 2022-04-01 MED ORDER — ACETAMINOPHEN 325 MG PO TABS: 325.0000 mg | ORAL_TABLET | Freq: Four times a day (QID) | ORAL | Status: DC | PRN

## 2022-04-01 SURGICAL SUPPLY — 80 items
AID PSTN UNV HD RSTRNT DISP (MISCELLANEOUS) ×1
ANCH SUT 2.6 FBRSTK 1.7 (Anchor) ×2 IMPLANT
ANCH SUT 2.6 FBRTK 1.7 KNTLS (Anchor) ×1 IMPLANT
ANCH SUT 4.75 SWIVELOCK ANCH (Orthopedic Implant) ×1 IMPLANT
ANCH SUT SWLK 19.1X4.75 (Anchor) ×2 IMPLANT
ANCHOR SUT BIO SW 4.75X19.1 (Anchor) IMPLANT
ANCHOR SUT FBRTK 2.6X1.7X2 (Anchor) IMPLANT
APL PRP STRL LF DISP 70% ISPRP (MISCELLANEOUS) ×2
BAG COUNTER SPONGE SURGICOUNT (BAG) ×1 IMPLANT
BAG SPNG CNTER NS LX DISP (BAG) ×1
BIT DRILL 3.2 PERIPHERAL SCREW (BIT) IMPLANT
BLADE SAW SAG 29X58X.64 (BLADE) IMPLANT
CHLORAPREP W/TINT 26 (MISCELLANEOUS) ×1 IMPLANT
CNTNR URN SCR LID CUP LEK RST (MISCELLANEOUS) IMPLANT
CONT SPEC 4OZ STRL OR WHT (MISCELLANEOUS) ×3
COVER SURGICAL LIGHT HANDLE (MISCELLANEOUS) ×1 IMPLANT
DRAPE IMP U-DRAPE 54X76 (DRAPES) ×1 IMPLANT
DRAPE INCISE IOBAN 66X45 STRL (DRAPES) ×1 IMPLANT
DRAPE U-SHAPE 47X51 STRL (DRAPES) ×2 IMPLANT
DRSG AQUACEL AG ADV 3.5X10 (GAUZE/BANDAGES/DRESSINGS) ×1 IMPLANT
ELECT BLADE 4.0 EZ CLEAN MEGAD (MISCELLANEOUS) ×1
ELECT REM PT RETURN 9FT ADLT (ELECTROSURGICAL) ×1
ELECTRODE BLDE 4.0 EZ CLN MEGD (MISCELLANEOUS) ×1 IMPLANT
ELECTRODE REM PT RTRN 9FT ADLT (ELECTROSURGICAL) ×1 IMPLANT
GAUZE PAD ABD 8X10 STRL (GAUZE/BANDAGES/DRESSINGS) IMPLANT
GAUZE SPONGE 4X4 12PLY STRL (GAUZE/BANDAGES/DRESSINGS) IMPLANT
GAUZE XEROFORM 5X9 LF (GAUZE/BANDAGES/DRESSINGS) IMPLANT
GLENOSPHERE STANDARD 39 (Joint) ×1 IMPLANT
GLENOSPHERE STD 39 (Joint) IMPLANT
GLOVE BIO SURGEON STRL SZ7.5 (GLOVE) ×3 IMPLANT
GLOVE BIOGEL PI IND STRL 6.5 (GLOVE) ×1 IMPLANT
GLOVE BIOGEL PI IND STRL 8 (GLOVE) ×2 IMPLANT
GLOVE ECLIPSE 6.0 STRL STRAW (GLOVE) ×1 IMPLANT
GLOVE INDICATOR 8.0 STRL GRN (GLOVE) ×1 IMPLANT
GOWN STRL REUS W/ TWL LRG LVL3 (GOWN DISPOSABLE) ×2 IMPLANT
GOWN STRL REUS W/TWL LRG LVL3 (GOWN DISPOSABLE) ×2
HANDPIECE INTERPULSE COAX TIP (DISPOSABLE) ×1
IMPL FIBERTAK KNTLS 2.6 (Anchor) IMPLANT
IMPLANT FIBERTAK KNTLS 2.6 (Anchor) ×1 IMPLANT
INSERT CUP HUM RET 1/2 39 +3 (Insert) IMPLANT
JET LAVAGE IRRISEPT WOUND (IRRIGATION / IRRIGATOR) ×1
KIT ANCHOR FBRTK 2.6 STR (KITS) IMPLANT
KIT BASIN OR (CUSTOM PROCEDURE TRAY) ×1 IMPLANT
KIT STABILIZATION SHOULDER (MISCELLANEOUS) ×1 IMPLANT
KIT TURNOVER KIT B (KITS) ×1 IMPLANT
LAVAGE JET IRRISEPT WOUND (IRRIGATION / IRRIGATOR) IMPLANT
MANIFOLD NEPTUNE II (INSTRUMENTS) ×1 IMPLANT
NDL HYPO 21X1 ECLIPSE (NEEDLE) ×1 IMPLANT
NDL MAYO TROCAR (NEEDLE) ×1 IMPLANT
NEEDLE HYPO 21X1 ECLIPSE (NEEDLE) ×1 IMPLANT
NEEDLE MAYO TROCAR (NEEDLE) IMPLANT
NS IRRIG 1000ML POUR BTL (IV SOLUTION) ×1 IMPLANT
PACK SHOULDER (CUSTOM PROCEDURE TRAY) ×1 IMPLANT
PACK UNIVERSAL I (CUSTOM PROCEDURE TRAY) ×1 IMPLANT
PAD ARMBOARD 7.5X6 YLW CONV (MISCELLANEOUS) ×2 IMPLANT
PAD COLD SHLDR WRAP-ON (PAD) ×1 IMPLANT
RESTRAINT HEAD UNIVERSAL NS (MISCELLANEOUS) ×1 IMPLANT
SET HNDPC FAN SPRY TIP SCT (DISPOSABLE) ×1 IMPLANT
SLING ARM IMMOBILIZER LRG (SOFTGOODS) ×1 IMPLANT
SPONGE T-LAP 18X18 ~~LOC~~+RFID (SPONGE) ×1 IMPLANT
STAPLER VISISTAT 35W (STAPLE) ×1 IMPLANT
SUCTION FRAZIER HANDLE 10FR (MISCELLANEOUS)
SUCTION TUBE FRAZIER 10FR DISP (MISCELLANEOUS) ×1 IMPLANT
SUT ETHILON 3 0 PS 1 (SUTURE) IMPLANT
SUT FIBERWIRE #2 38 T-5 BLUE (SUTURE) ×1
SUT FIBERWIRE #5 38 CONV NDL (SUTURE)
SUT VIC AB 0 CT1 27 (SUTURE) ×2
SUT VIC AB 0 CT1 27XBRD ANBCTR (SUTURE) ×2 IMPLANT
SUT VIC AB 2-0 CT1 27 (SUTURE) ×2
SUT VIC AB 2-0 CT1 TAPERPNT 27 (SUTURE) ×2 IMPLANT
SUTURE FIBERWR #2 38 T-5 BLUE (SUTURE) IMPLANT
SUTURE FIBERWR #5 38 CONV NDL (SUTURE) ×2 IMPLANT
SYR 50ML LL SCALE MARK (SYRINGE) ×1 IMPLANT
SYSTEM IMPL TENODESIS LNT 4.75 (Orthopedic Implant) IMPLANT
TAPE CLOTH SURG 4X10 WHT LF (GAUZE/BANDAGES/DRESSINGS) IMPLANT
TAPE LABRALWHITE 1.5X36 (TAPE) IMPLANT
TAPE SUT LABRALTAP WHT/BLK (SUTURE) IMPLANT
TOWEL GREEN STERILE (TOWEL DISPOSABLE) ×1 IMPLANT
TRAY FOLEY MTR SLVR 16FR STAT (SET/KITS/TRAYS/PACK) ×1 IMPLANT
WATER STERILE IRR 1000ML POUR (IV SOLUTION) ×1 IMPLANT

## 2022-04-01 NOTE — Op Note (Signed)
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  The risk and benefits of the procedure were discussed in detail and documented in the pre-operative evaluation.   PREOPERATIVE DIAGNOSIS: 1. Right shoulder recurrent instability  POSTOPERATIVE DIAGNOSIS: Same.  PROCEDURE: 1. Right shoulder revision arthroplasty  SURGEON: Yevonne Pax MD  ASSISTANT: Raynelle Fanning, ATC  ANESTHESIA:  general  IV FLUIDS AND URINE: See anesthesia record.  ANTIBIOTICS: Ancef 3g  ESTIMATED BLOOD LOSS: 50 mL.  IMPLANTS:  Implant Name Type Inv. Item Serial No. Manufacturer Lot No. LRB No. Used Action  SYSTEM IMPL TENODESIS LNT 4.75 - [id number] Orthopedic Implant SYSTEM IMPL TENODESIS LNT 4.75  [employer] [id number] Right 1 Implanted  ANCHOR SUT BIO SW 4.75X19.1 - [id number] Anchor ANCHOR SUT BIO SW 4.75X19.1  [employer] [id number] Right 1 Implanted  ANCHOR SUT BIO SW 4.75X19.1 - [id number] Anchor ANCHOR SUT BIO SW 4.75X19.1  [employer] [id number] Right 1 Implanted  ANCHOR SUT FBRTK 2.6X1.7X2 - [id number] Anchor ANCHOR SUT FBRTK 2.6X1.7X2  [employer] [id number] Right 1 Implanted  IMPLANT FIBERTAK KNTLS 2.6 - [id number] Anchor IMPLANT FIBERTAK KNTLS 2.6  [employer] [id number] Right 1 Implanted  ANCHOR SUT FBRTK 2.6X1.7X2 - [id number] Anchor ANCHOR SUT FBRTK 2.6X1.7X2  [employer] [id number] Right 1 Implanted  GLENOSPHERE STANDARD 39 - [id number] Joint GLENOSPHERE STANDARD 39 [id number] [employer]  Right 1 Implanted    DRAINS: None  CULTURES: None  COMPLICATIONS: none  DESCRIPTION OF PROCEDURE:  Patient was identified in the preoperative holding area.  Anesthesia performed an interscalene nerve block after universal timeout was performed with nursing.  Ancef was given 1 hour prior to skin incision.    The surgical site was scrubbed with a chlorhexidine scrub brush and alcohol.  The patient  was then prepped with chlorhexidine skin prep.  The patient was subsequently taken back to the operating room.  Anesthesia was induced.  He was transferred to the beachchair position.  All bony prominences were padded.  Final timeout was again performed.     The bony landmarks of the shoulder were marked with a marking pen. A delto-pectoral incision was made, extending up approximately 5 inches. The wound with then irrigated with dilute betadine. Cephalic vein was identified, and an protected. This was retracted medially.Subdeltoid and subpectoral lesions were released. Neurovascular structures were carefully protected. The Gelpi retractor was used to retract the deltoid and pectoralis major. A 1 cm release was performed on the upper pectoralis.   The deltoid was retracted laterally with a Brown humeral retractor.  The conjoined tendon was identified. The cleido-pectoral fascia was excised.  Once this was completed the dislocated prosthesis was encountered and a circumferential release was performed around the neck of the humerus.  Once the joint was exposed the proximal humerus was delivered with external rotation and extension of the arm.  The polyethylene was removed by drilling the central hole and then placing a screw until the polyethylene component popped free.  At this time cultures were taken from the humeral neck as well as from the glenoid and from the membrane around the capsule of the shoulder.  Antibiotics were given following this.  There was no evidence of gross infection. A total of 3 deep cultures were taken and sent for 7 days total.   Attention was then turned to the glenoid.  Posteriorly a large Darach retractor was used.  A 360 Degree release of the subscapularis  and glenoid were done. Glenoid retractors were placed posteriorly, superiorly behind the biceps tendon and anteriorly on the glenoid neck. A 360-degree release of the capsule was performed with cautery.  The triceps was  released off the inferior tubercle of the glenoid. The axillary nerve was carefully protected with the surgeon's index finger, retracting it and using cautery.  At this time the previous glenoid baseplate was removed.  Irrigation was performed at this time with dilute CHG. At this time 2 additional suture anchors double loaded 2.6 mm knotless RC anchors were placed at the 10:00 and 2 o'clock position in the glenoid.  These both had excellent purchase. An increase size 39 glenosphere was impacted into place.     At this time, the humerus was then redelivered and a series of polyethylene trials were then trialed and the size 3 constrained liner had the best stability.  As result the final polyethylene was chosen and this was reduced into place.  Appropriate tension was noted on the conjoined tendon and deltoid muscle. Extension was stable, external and internal rotation as well.  The subscapularis was not able to be repaired.  The wound was then irrigated. Vancomycin powder was placed in the wound again for infection prevention.  At this time the suture limbs were brought anterior and posterior on the lateral humerus and a speed bridge type configuration into two 4.75 mm lateral row swivel locks.  These all had excellent purchase and created a nidus of suture over the superior portion of the prosthesis to prevent dislocation.   The wound was then closed in layers with 0 Vicryl interrupted in the deep subcu followed by 2-0 Vicryl in the superficial subcu and 3-0 nylon for skin. Xeroform, guaze, ABD and metapore tape were placed.  A shoulder immobilizer was applied.         POSTOPERATIVE PLAN: She will be strict nonweightbearing for 2 weeks in a sling.  I will plan to round on her in the hospital and to follow her cultures.  I will plan to follow his cultures outpatient for 7 days total.  Yevonne Pax, MD 6:15 PM

## 2022-04-01 NOTE — Anesthesia Procedure Notes (Signed)
Anesthesia Regional Block: Interscalene brachial plexus block   Pre-Anesthetic Checklist: , timeout performed,  Correct Patient, Correct Site, Correct Laterality,  Correct Procedure, Correct Position, site marked,  Risks and benefits discussed,  Surgical consent,  Pre-op evaluation,  At surgeon's request and post-op pain management  Laterality: Right  Prep: chloraprep       Needles:  Injection technique: Single-shot  Needle Type: Echogenic Stimulator Needle     Needle Length: 5cm  Needle Gauge: 22     Additional Needles:   Narrative:  Start time: 04/01/2022 3:21 PM End time: 04/01/2022 3:31 PM Injection made incrementally with aspirations every 5 mL.  Performed by: Personally  Anesthesiologist: Duane Boston, MD  Additional Notes: Functioning IV was confirmed and monitors applied.  A 1m 22ga echogenic arrow stimulator was used. Sterile prep and drape,hand hygiene and sterile gloves were used.Ultrasound guidance: relevant anatomy identified, needle position confirmed, local anesthetic spread visualized around nerve(s)., vascular puncture avoided.  Image printed for medical record.  Negative aspiration and negative test dose prior to incremental administration of local anesthetic. The patient tolerated the procedure well.

## 2022-04-01 NOTE — Anesthesia Preprocedure Evaluation (Addendum)
Anesthesia Evaluation  Patient identified by MRN, date of birth, ID band Patient awake    Reviewed: Allergy & Precautions, NPO status , Patient's Chart, lab work & pertinent test results  History of Anesthesia Complications Negative for: history of anesthetic complications  Airway Mallampati: II  TM Distance: >3 FB Neck ROM: Full    Dental no notable dental hx. (+) Dental Advisory Given   Pulmonary former smoker Quit smoking 2017, 25 pack year history  Snores at night, has never had sleep study    Pulmonary exam normal        Cardiovascular negative cardio ROS Normal cardiovascular exam     Neuro/Psych  Headaches PSYCHIATRIC DISORDERS Anxiety Depression       GI/Hepatic Neg liver ROS,GERD  Medicated and Controlled,,  Endo/Other    Morbid obesityBMI 81  Renal/GU negative Renal ROS  negative genitourinary   Musculoskeletal  (+) Arthritis , Rheumatoid disorders,  Ankylosing spondylitis    Abdominal   Peds  Hematology negative hematology ROS (+) Hb 14.3, plt 288   Anesthesia Other Findings   Reproductive/Obstetrics negative OB ROS S/p hysterectomy                              Anesthesia Physical Anesthesia Plan  ASA: 3  Anesthesia Plan: General and Regional   Post-op Pain Management: Regional block*, Tylenol PO (pre-op)* and Celebrex PO (pre-op)*   Induction: Intravenous  PONV Risk Score and Plan: 3 and Ondansetron, Dexamethasone, Midazolam and Treatment may vary due to age or medical condition  Airway Management Planned: Oral ETT  Additional Equipment: None  Intra-op Plan:   Post-operative Plan: Extubation in OR  Informed Consent: I have reviewed the patients History and Physical, chart, labs and discussed the procedure including the risks, benefits and alternatives for the proposed anesthesia with the patient or authorized representative who has indicated his/her  understanding and acceptance.     Dental advisory given  Plan Discussed with: CRNA, Anesthesiologist and Surgeon  Anesthesia Plan Comments:         Anesthesia Quick Evaluation

## 2022-04-01 NOTE — Anesthesia Postprocedure Evaluation (Signed)
Anesthesia Post Note  Patient: Anna Keller  Procedure(s) Performed: RIGHT REVERSE SHOULDER ARTHROPLASTY REVISION (Right: Shoulder)     Patient location during evaluation: PACU Anesthesia Type: Regional and General Level of consciousness: awake and alert Pain management: pain level controlled Vital Signs Assessment: post-procedure vital signs reviewed and stable Respiratory status: spontaneous breathing, nonlabored ventilation, respiratory function stable and patient connected to nasal cannula oxygen Cardiovascular status: blood pressure returned to baseline and stable Postop Assessment: no apparent nausea or vomiting Anesthetic complications: no   No notable events documented.  Last Vitals:  Vitals:   04/01/22 2000 04/01/22 2027  BP: 109/78 121/75  Pulse: 88 96  Resp: (!) 23 18  Temp: 36.8 C 36.8 C  SpO2: 92% 94%    Last Pain:  Vitals:   04/01/22 2027  TempSrc: Oral  PainSc: 10-Worst pain ever                 Anna Keller

## 2022-04-01 NOTE — Transfer of Care (Signed)
Immediate Anesthesia Transfer of Care Note  Patient: Anna Keller  Procedure(s) Performed: RIGHT REVERSE SHOULDER ARTHROPLASTY REVISION (Right: Shoulder)  Patient Location: PACU  Anesthesia Type:GA combined with regional for post-op pain  Level of Consciousness: awake, oriented, and drowsy  Airway & Oxygen Therapy: Patient Spontanous Breathing and Patient connected to face mask oxygen  Post-op Assessment: Report given to RN and Post -op Vital signs reviewed and stable  Post vital signs: Reviewed and stable  Last Vitals:  Vitals Value Taken Time  BP 150/104 04/01/22 1823  Temp    Pulse 96 04/01/22 1827  Resp 17 04/01/22 1827  SpO2 98 % 04/01/22 1827  Vitals shown include unvalidated device data.  Last Pain:  Vitals:   04/01/22 1458  TempSrc: Oral  PainSc: 0-No pain         Complications: No notable events documented.

## 2022-04-01 NOTE — Interval H&P Note (Signed)
History and Physical Interval Note:  04/01/2022 3:22 PM  Anna Keller  has presented today for surgery, with the diagnosis of RIGHT REVERSE SHOULDER ARTHROPLASTY INSTABILITY.  The various methods of treatment have been discussed with the patient and family. After consideration of risks, benefits and other options for treatment, the patient has consented to  Procedure(s): [place] (Right) as a surgical intervention.  The patient's history has been reviewed, patient examined, no change in status, stable for surgery.  I have reviewed the patient's chart and labs.  Questions were answered to the patient's satisfaction.     Vanetta Mulders

## 2022-04-01 NOTE — Anesthesia Procedure Notes (Signed)
Procedure Name: Intubation Date/Time: 04/01/2022 4:42 PM  Performed by: Anastasio Auerbach, CRNAPre-anesthesia Checklist: Patient identified, Emergency Drugs available, Suction available and Patient being monitored Patient Re-evaluated:Patient Re-evaluated prior to induction Oxygen Delivery Method: Circle system utilized Preoxygenation: Pre-oxygenation with 100% oxygen Induction Type: IV induction Ventilation: Mask ventilation without difficulty Laryngoscope Size: Miller, 3 and Glidescope Grade View: Grade I Tube type: Oral Tube size: 7.0 mm Number of attempts: 1 Airway Equipment and Method: Stylet and Oral airway Placement Confirmation: ETT inserted through vocal cords under direct vision, positive ETCO2 and breath sounds checked- equal and bilateral Secured at: 22 cm Tube secured with: Tape Dental Injury: Teeth and Oropharynx as per pre-operative assessment  Future Recommendations: Recommend- induction with short-acting agent, and alternative techniques readily available Comments: Attempt #1 by CRNA with MAC 3 and epiglottis barely visible due to lingual tonsils. Attempt #2 by Tobias Alexander MD with no view of epiglottis due to same.  Ventilated and attempted with glidescope #3 with grade 2 view and ETT easily passed.

## 2022-04-01 NOTE — Brief Op Note (Signed)
   Brief Op Note  Date of Surgery: 04/01/2022  Preoperative Diagnosis: RIGHT REVERSE SHOULDER ARTHROPLASTY INSTABILITY  Postoperative Diagnosis: same  Procedure: Procedure(s): RIGHT REVERSE SHOULDER ARTHROPLASTY REVISION  Implants: Implant Name Type Inv. Item Serial No. Manufacturer Lot No. LRB No. Used Action  SYSTEM IMPL TENODESIS LNT 4.75 - [id number] Orthopedic Implant SYSTEM IMPL TENODESIS LNT 4.75  [employer] [id number] Right 1 Implanted  ANCHOR SUT BIO SW 4.75X19.1 - [id number] Anchor ANCHOR SUT BIO SW 4.75X19.1  [employer] [id number] Right 1 Implanted  ANCHOR SUT BIO SW 4.75X19.1 - [id number] Anchor ANCHOR SUT BIO SW 4.75X19.1  [employer] [id number] Right 1 Implanted  ANCHOR SUT FBRTK 2.6X1.7X2 - [id number] Anchor ANCHOR SUT FBRTK 2.6X1.7X2  [employer] [id number] Right 1 Implanted  IMPLANT FIBERTAK KNTLS 2.6 - [id number] Anchor IMPLANT FIBERTAK KNTLS 2.6  [employer] [id number] Right 1 Implanted  ANCHOR SUT FBRTK 2.6X1.7X2 - [id number] Anchor ANCHOR SUT FBRTK 2.6X1.7X2  [employer] [id number] Right 1 Implanted  GLENOSPHERE STANDARD 39 - [id number] Joint GLENOSPHERE STANDARD 39 [id number] [employer]  Right 1 Implanted    Surgeons: Surgeon(s): Vanetta Mulders, MD  Anesthesia: Choice    Estimated Blood Loss: See anesthesia record  Complications: None  Condition to PACU: Stable  Yevonne Pax, MD 04/01/2022 6:15 PM

## 2022-04-02 DIAGNOSIS — T84028A Dislocation of other internal joint prosthesis, initial encounter: Secondary | ICD-10-CM | POA: Diagnosis not present

## 2022-04-02 LAB — BASIC METABOLIC PANEL
Anion gap: 11 (ref 5–15)
BUN: 16 mg/dL (ref 6–20)
CO2: 23 mmol/L (ref 22–32)
Calcium: 9.3 mg/dL (ref 8.9–10.3)
Chloride: 105 mmol/L (ref 98–111)
Creatinine, Ser: 0.86 mg/dL (ref 0.44–1.00)
GFR, Estimated: >60 mL/min (ref >60)
Glucose, Bld: 163 mg/dL — ABNORMAL HIGH (ref 70–99)
Potassium: 4.4 mmol/L (ref 3.5–5.1)
Sodium: 139 mmol/L (ref 135–145)

## 2022-04-02 MED ORDER — OXYCODONE HCL 5 MG PO TABS: 5.0000 mg | ORAL_TABLET | ORAL | 0 refills | Status: DC | PRN

## 2022-04-02 MED ORDER — ACETAMINOPHEN 500 MG PO TABS: 500.0000 mg | ORAL_TABLET | Freq: Three times a day (TID) | ORAL | 0 refills | Status: AC

## 2022-04-02 MED ORDER — DOXYCYCLINE HYCLATE 50 MG PO CAPS: 50.0000 mg | ORAL_CAPSULE | Freq: Two times a day (BID) | ORAL | 0 refills | Status: AC

## 2022-04-02 MED ORDER — ASPIRIN 325 MG PO TBEC: 325.0000 mg | DELAYED_RELEASE_TABLET | Freq: Every day | ORAL | 0 refills | Status: DC

## 2022-04-02 NOTE — Discharge Instructions (Signed)
     Discharge Instructions    Attending Surgeon: Vanetta Mulders, MD Office Phone Number: [phone]   Diagnosis and Procedures:    Surgeries Performed: Right shoulder revision arthroplasty  Discharge Plan:    Diet: Resume usual diet. Begin with light or bland foods.  Drink plenty of fluids.  Activity:  Keep sling and dressing in place until your follow up visit in Physical Therapy You are advised to go home directly from the hospital or surgical center. Restrict your activities.  GENERAL INSTRUCTIONS: 1.  Keep your surgical site elevated above your heart for at least 5-7 days or longer to prevent swelling. This will improve your comfort and your overall recovery following surgery.     2. Please call Dr. Eddie Dibbles office at [phone] with questions Monday-Friday during business hours. If no one answers, please leave a message and someone should get back to the patient within 24 hours. For emergencies please call 911 or proceed to the emergency room.   3. Patient to notify surgical team if experiences any of the following: Bowel/Bladder dysfunction, uncontrolled pain, nerve/muscle weakness, incision with increased drainage or redness, nausea/vomiting and Fever greater than 101.0 F.  Be alert for signs of infection including redness, streaking, odor, fever or chills. Be alert for excessive pain or bleeding and notify your surgeon immediately.  WOUND INSTRUCTIONS:   Leave your dressing/cast/splint in place until your post operative visit.  Keep it clean and dry.  Always keep the incision clean and dry until the staples/sutures are removed. If there is no drainage from the incision you should keep it open to air. If there is drainage from the incision you must keep it covered at all times until the drainage stops  Do not soak in a bath tub, hot tub, pool, lake or other body of water until 21 days after your surgery and your incision is completely dry and healed.  If you have  removable sutures (or staples) they must be removed 10-14 days (unless otherwise instructed) from the day of your surgery.     1)  Elevate the extremity as much as possible.  2)  Keep the dressing clean and dry.  3)  Please call us if the dressing becomes wet or dirty.  4)  If you are experiencing worsening pain or worsening swelling, please call.     MEDICATIONS: Resume all previous home medications at the previous prescribed dose and frequency unless otherwise noted Start taking the  pain medications on an as-needed basis as prescribed  Please taper down pain medication over the next week following surgery.  Ideally you should not require a refill of any narcotic pain medication.  Take pain medication with food to minimize nausea. In addition to the prescribed pain medication, you may take over-the-counter pain relievers such as Tylenol.  Do NOT take additional tylenol if your pain medication already has tylenol in it.  Aspirin '325mg'$  daily for four weeks.      FOLLOWUP INSTRUCTIONS: 1. Follow up at the Physical Therapy Clinic 3-4 days following surgery. This appointment should be scheduled unless other arrangements have been made.The Physical Therapy scheduling number is [phone] if an appointment has not already been arranged.  2. Contact Dr. Eddie Dibbles office during office hours at [phone] or the practice after hours line at [phone] for non-emergencies. For medical emergencies call 911.   Discharge Location: Home

## 2022-04-02 NOTE — Progress Notes (Signed)
PT Cancellation Note  Patient Details Name: Anna Keller MRN: [id number] DOB: 1970-09-05   Cancelled Treatment:    Reason Eval/Treat Not Completed: PT screened, no needs identified, will sign off (per OT, no PT needs currently).  Leighton Roach, PT  Acute Rehab Services Secure chat preferred Office [place] 04/02/2022, 10:30 AM

## 2022-04-02 NOTE — Plan of Care (Signed)

## 2022-04-02 NOTE — Plan of Care (Signed)

## 2022-04-02 NOTE — Evaluation (Signed)
Occupational Therapy Evaluation Patient Details Name: Anna Keller MRN: [id number] DOB: 1970-06-04 Today's Date: 04/02/2022   History of Present Illness Anna Keller is a 51 y.o.-year-old female who underwent Right shoulder reverse shoulder arthroplasty on 01/05/2022.  Felt "pop" to shoulder in November, and imaging revealed joint was reduced. Now s/p Right shoulder revision arthroplasty on 04/01/2022.   Clinical Impression   Pt is a 51 year old female, s/p shoulder replacement revision without functional use of RT dominant upper extremity secondary to effects of surgery and interscalene block and shoulder precautions. Therapist provided education and instruction to patient  in regards to exercises, precautions, positioning, donning upper extremity clothing and bathing while maintaining shoulder precautions, ice and edema management and donning/doffing sling. Patient verbalized understanding and demonstrated as needed. Patient needed assistance to donn shirt, underwear, pants, socks and shoes and provided with instruction on compensatory strategies to perform ADLs. Patient limited by decreased ROM in RT shoulder so therefore will need some form of assistance at home which will be provided 24/7 by spouse and pt's mother. Patient verbalized and/or demonstrated understanding to all instruction. Patient to follow up with MD for further therapy needs.        Recommendations for follow up therapy are one component of a multi-disciplinary discharge planning process, led by the attending physician.  Recommendations may be updated based on patient status, additional functional criteria and insurance authorization.   Follow Up Recommendations  Follow physician's recommendations for discharge plan and follow up therapies     Assistance Recommended at Discharge Intermittent Supervision/Assistance  Patient can return home with the following A little help with bathing/dressing/bathroom;Assist for  transportation    Functional Status Assessment  Patient has had a recent decline in their functional status and demonstrates the ability to make significant improvements in function in a reasonable and predictable amount of time.  Equipment Recommendations  None recommended by OT    Recommendations for Other Services       Precautions / Restrictions Precautions Precautions: Shoulder Shoulder Interventions: Shoulder sling/immobilizer;At all times Precaution Comments: Sling at all times for 2 weeks. Required Braces or Orthoses: Sling Restrictions Weight Bearing Restrictions: Yes RUE Weight Bearing: Non weight bearing Other Position/Activity Restrictions: Strict NWB      Mobility Bed Mobility Overal bed mobility: Modified Independent                  Transfers Overall transfer level: Modified independent                        Balance Overall balance assessment: Modified Independent                                         ADL either performed or assessed with clinical judgement   ADL Overall ADL's : Needs assistance/impaired Eating/Feeding: Set up   Grooming: Wash/dry hands;Standing;Modified independent   Upper Body Bathing: Minimal assistance;Adhering to UE precautions;Cueing for UE precautions   Lower Body Bathing: Sit to/from stand;Sitting/lateral leans;Modified independent   Upper Body Dressing : Minimal assistance Upper Body Dressing Details (indicate cue type and reason): Pt reports that since her initial rTSA she and her mother worked out any easy system of stepping into spaghetti strap gowns and pulling up arm. Pt was educated on technique for front closeure and overhead shirts. Lower Body Dressing: Minimal assistance Lower Body Dressing Details (indicate cue type  and reason): Min As to complete donning underwear behind RT hip. Toilet Transfer: Modified Independent;Ambulation;Comfort height toilet   Toileting- Clothing  Manipulation and Hygiene: Modified independent   Tub/ Shower Transfer: Supervision/safety   Functional mobility during ADLs: Modified independent       Vision Baseline Vision/History: 1 Wears glasses Vision Assessment?: No apparent visual deficits     Perception     Praxis      Pertinent Vitals/Pain Pain Assessment Pain Assessment: 0-10 Pain Score: 5  Pain Location: RT shoulder. Reminder of RUE is numb Pain Intervention(s): Limited activity within patient's tolerance, Monitored during session, Repositioned, Premedicated before session     Hand Dominance Right   Extremity/Trunk Assessment Upper Extremity Assessment Upper Extremity Assessment: RUE deficits/detail;LUE deficits/detail RUE: Unable to fully assess due to immobilization RUE Sensation: decreased light touch LUE Deficits / Details: H/o of RA, but functional.   Lower Extremity Assessment Lower Extremity Assessment: Overall WFL for tasks assessed   Cervical / Trunk Assessment Cervical / Trunk Assessment: Normal   Communication Communication Communication: No difficulties   Cognition Arousal/Alertness: Awake/alert Behavior During Therapy: WFL for tasks assessed/performed Overall Cognitive Status: Within Functional Limits for tasks assessed                                       General Comments       Exercises     Shoulder Instructions Shoulder Instructions Donning/doffing shirt without moving shoulder: Caregiver independent with task;Patient able to independently direct caregiver Method for sponge bathing under operated UE: Caregiver independent with task;Patient able to independently direct caregiver Donning/doffing sling/immobilizer: Caregiver independent with task;Patient able to independently direct caregiver (after education) Correct positioning of sling/immobilizer: Caregiver independent with task;Patient able to independently direct caregiver (afterr educaution) ROM for elbow,  wrist and digits of operated UE: Independent (Did not clear for elbow exercises due to sling precautions at all times.) Proper positioning of operated UE when showering:  (Pt reports not yet cleared to shower and will follow up with surgeon in 1 week for waterproof bandage) Dressing change: Caregiver independent with task;Patient able to independently direct caregiver Positioning of UE while sleeping: Caregiver independent with task;Independent;Patient able to independently direct caregiver    Home Living Family/patient expects to be discharged to:: Private residence Living Arrangements: Spouse/significant other;Other relatives (Pt is going home for weekend with husband, then to her Mom's house on Monday for contiued 24/7 assistance. 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        OT Problem List: Impaired UE functional use      OT Treatment/Interventions:      OT Goals(Current goals can be found in the care plan section) Acute Rehab OT Goals Patient Stated Goal: For the shoulder to stay in place. OT Goal Formulation:  All assessment and education complete, DC therapy Potential to Achieve Goals: Good ADL Goals Additional ADL Goal #1: Pt will demonstrate UE/LE dressing, donning/doffing of sling, correct positioning of RT UE, and compensatory strategies for RT axilla  hygiene, all while correctly following all shoulder post-op precautions/restrictions.  OT Frequency:      Co-evaluation              AM-PAC OT "6 Clicks" Daily Activity     Outcome Measure Help from another person eating meals?: A Little Help from another person taking care of personal grooming?: A Little Help from another person toileting, which includes using toliet, bedpan, or urinal?: A Little Help from another person bathing (including washing, rinsing, drying)?: A Little Help from another person to put on and taking off regular upper body clothing?: A Little Help from another person to put on and taking off regular lower body clothing?: A Little 6 Click Score: 18   End of Session Equipment Utilized During Treatment: Other (comment) (sling) Nurse Communication: Weight bearing status  Activity Tolerance: Patient tolerated treatment well Patient left: in bed;with call bell/phone within reach  OT Visit Diagnosis: Muscle weakness (generalized) (M62.81);Pain Pain - Right/Left: Right Pain - part of body: Shoulder                Time: 1448-1856 OT Time Calculation (min): 26 min Charges:  OT General Charges $OT Visit: 1 Visit OT Evaluation $OT Eval Low Complexity: 1 Low OT Treatments $Self Care/Home Management : 8-22 mins  Anderson Malta, OT Acute Rehab Services Office: [phone] 04/02/2022   Julien Girt 04/02/2022, 10:00 AM

## 2022-04-02 NOTE — Progress Notes (Addendum)
Explained discharge instructions to patient. Reviewed follow up appointment and next medication administration times. Also reviewed education. Patient verbalized having an understanding for instructions given. Removed IV. All belongings are in the patient's possession. No other needs verbalized. Transported downstairs to the discharge lounge for discharge.

## 2022-04-02 NOTE — Progress Notes (Signed)
   Subjective:  Patient reports pain as moderate overnight. Blockade wearing off. Tolerating some PO. Was able to void.  Objective:   VITALS:   Vitals:   04/01/22 1945 04/01/22 2000 04/01/22 2027 04/02/22 0605  BP: 121/78 109/78 121/75 104/69  Pulse: 88 88 96 95  Resp: (!) 23 (!) '23 18 19  '$ Temp:  98.2 F (36.8 C) 98.2 F (36.8 C) 99.9 F (37.7 C)  TempSrc:   Oral Oral  SpO2: 94% 92% 94% 91%  Weight:      Height:        Sling in place. Dressing with some staining. Fires EPL/Wrist extensors, flexors. 2+RP   Lab Results  Component Value Date   WBC 6.6 04/01/2022   HGB 13.0 04/01/2022   HCT 40.3 04/01/2022   MCV 92.4 04/01/2022   PLT 276 04/01/2022     Assessment/Plan:  1 Day Post-Op right revision reverse arthroplasty.  - Patient to work with PT/OT to optimize mobilization safely - DVT ppx - SCDs, ambulation, Aspirin 325 daily - Postoperative Abx: DC on 2 weeks doxycycline - NWB operative extremity - Pain control - multimodal pain management, ATC acetaminophen in conjunction with as needed narcotic (oxycodone), although this should be minimized with other modalities  - Will continue to follow cultures, remain NGTD - Plan DC home today.  Anna Keller 04/02/2022, 7:28 AM

## 2022-04-05 ENCOUNTER — Ambulatory Visit: Payer: 59 | Admitting: Dietician

## 2022-04-05 NOTE — Discharge Summary (Signed)
Patient ID: Anna Keller MRN: [id number] DOB/AGE: 1971/02/24 51 y.o.  Admit date: 04/01/2022 Discharge date: 04/02/22  Admission Diagnoses:  Status post reverse arthroplasty of right shoulder  Discharge Diagnoses:  Principal Problem:   Status post reverse arthroplasty of right shoulder   Past Medical History:  Diagnosis Date   ADHD (attention deficit hyperactivity disorder)    Ankylosing spondylitis ([place])    Anxiety    Arthritis    Depression    Eczema    Family history of adverse reaction to anesthesia    dad would be crazy, talk out of his head-per pt   Headache    Rheumatoid arthritis ([place])     Surgeries: Procedure(s): RIGHT REVERSE SHOULDER ARTHROPLASTY REVISION on 04/01/2022   Consultants (if any):   Discharged Condition: Improved  Hospital Course: Anna Keller is an 51 y.o. female who was admitted 04/01/2022 with a diagnosis of Status post reverse arthroplasty of right shoulder and went to the operating room on 04/01/2022 and underwent the above named procedures.    She was given perioperative antibiotics:  Anti-infectives (From admission, onward)    Start     Dose/Rate Route Frequency Ordered Stop   04/02/22 0600  ceFAZolin (ANCEF) IVPB 2g/100 mL premix        2 g 200 mL/hr over 30 Minutes Intravenous On call to O.R. 04/01/22 1438 04/01/22 1645   04/02/22 0000  doxycycline (VIBRAMYCIN) 50 MG capsule        50 mg Oral 2 times daily 04/02/22 0732 04/16/22 2359   04/01/22 1755  vancomycin (VANCOCIN) powder  Status:  Discontinued          As needed 04/01/22 1809 04/01/22 1820     .  She was given sequential compression devices, early ambulation, and appropriate chemoprophylaxis for DVT prophylaxis.  She benefited maximally from the hospital stay and there were no complications.    Recent vital signs:  Vitals:   04/02/22 0912 04/02/22 0914  BP: 94/64 101/73  Pulse: 89 91  Resp: 15 15  Temp: 98.3 F (36.8 C) 98.2 F (36.8 C)   SpO2: 91% (!) 88%    Recent laboratory studies:  Lab Results  Component Value Date   HGB 13.0 04/01/2022   HGB 14.3 12/29/2021   HGB 14.1 04/08/2021   Lab Results  Component Value Date   WBC 6.6 04/01/2022   PLT 276 04/01/2022   No results found for: "INR" Lab Results  Component Value Date   NA 139 04/02/2022   K 4.4 04/02/2022   CL 105 04/02/2022   CO2 23 04/02/2022   BUN 16 04/02/2022   CREATININE 0.86 04/02/2022   GLUCOSE 163 (H) 04/02/2022    Discharge Medications:   Allergies as of 04/02/2022   No Known Allergies      Medication List     TAKE these medications    acetaminophen 500 MG tablet Commonly known as: TYLENOL Take 1 tablet (500 mg total) by mouth every 8 (eight) hours for 10 days.   ALPRAZolam 1 MG tablet Commonly known as: XANAX Take 1 mg by mouth 3 (three) times daily as needed for anxiety.   amphetamine-dextroamphetamine 20 MG tablet Commonly known as: ADDERALL Take 20 mg by mouth 2 (two) times daily.   aspirin EC 325 MG tablet Take 1 tablet (325 mg total) by mouth daily.   busPIRone 7.5 MG tablet Commonly known as: BUSPAR Take 7.5 mg by mouth 3 (three) times daily.   CALCIUM 600+D3 PO  Take 1 tablet by mouth 3 (three) times daily.   cholecalciferol 25 MCG (1000 UNIT) tablet Commonly known as: VITAMIN D3 Take 1,000 Units by mouth daily.   clobetasol cream 0.05 % Commonly known as: TEMOVATE Apply 1 application  topically daily as needed (irritation).   diclofenac 50 MG EC tablet Commonly known as: VOLTAREN Take 50 mg by mouth 2 (two) times daily.   doxycycline 50 MG capsule Commonly known as: VIBRAMYCIN Take 1 capsule (50 mg total) by mouth 2 (two) times daily for 14 days.   levocetirizine 5 MG tablet Commonly known as: XYZAL Take 5 mg by mouth every evening.   multivitamin with minerals tablet Take 1 tablet by mouth daily.   omeprazole 20 MG capsule Commonly known as: PRILOSEC Take 1 capsule by mouth once daily    oxyCODONE 5 MG immediate release tablet Commonly known as: Roxicodone Take 1 tablet (5 mg total) by mouth every 4 (four) hours as needed for severe pain or breakthrough pain.   polyethylene glycol powder 17 GM/SCOOP powder Commonly known as: GLYCOLAX/MIRALAX Take 17 g by mouth daily.   rosuvastatin 40 MG tablet Commonly known as: CRESTOR Take 40 mg by mouth every evening.   traZODone 150 MG tablet Commonly known as: DESYREL Take 150 mg by mouth at bedtime as needed for sleep.   venlafaxine XR 150 MG 24 hr capsule Commonly known as: EFFEXOR-XR Take 1 capsule (150 mg total) by mouth daily. What changed:  how much to take when to take this        Diagnostic Studies: DG Shoulder Right  Result Date: 04/01/2022 CLINICAL DATA:  Postop shoulder surgery EXAM: RIGHT SHOULDER - 1 VIEW COMPARISON:  03/29/2022 FINDINGS: Right shoulder replacement in satisfactory position and alignment. No fracture or complication. No change from the prior study. IMPRESSION: Satisfactory right shoulder replacement. Electronically Signed   By: Franchot Gallo M.D.   On: 04/01/2022 18:50   DG Shoulder Right  Result Date: 03/29/2022 CLINICAL DATA:  Postop follow-up EXAM: RIGHT SHOULDER - 2+ VIEW COMPARISON:  01/18/2022. FINDINGS: Status post shoulder arthroplasty. No periprosthetic lucency identified to suggest loosening. No fracture, subluxation or dislocation identified. IMPRESSION: Unremarkable study status post shoulder arthroplasty. Electronically Signed   By: Sammie Bench M.D.   On: 03/29/2022 14:35   DG Shoulder 1V Right  Result Date: 03/19/2022 CLINICAL DATA:  Pain.  History of total right shoulder arthroplasty. EXAM: RIGHT SHOULDER - 1 VIEW COMPARISON:  Right shoulder radiographs 03/17/2022 and 01/05/2022 FINDINGS: Postsurgical changes of total right shoulder arthroplasty. The glenoid is mildly obliqued on limited single view. There is appropriate contact of the glenoid dome prosthesis and  humeral prosthesis. No perihardware lucency is seen to indicate hardware failure or loosening. Moderate acromioclavicular joint space narrowing and peripheral osteophytosis. No acute fracture or dislocation. IMPRESSION: 1. Status post total right shoulder arthroplasty without evidence of hardware failure on limited single frontal view. 2. Moderate acromioclavicular osteoarthritis. Electronically Signed   By: Yvonne Kendall M.D.   On: 03/19/2022 18:57   DG Shoulder Right  Result Date: 03/19/2022 CLINICAL DATA:  Right shoulder pain.  Question dislocation. EXAM: RIGHT SHOULDER - 2+ VIEW COMPARISON:  Plain films right shoulder 01/18/2022. FINDINGS: Reverse shoulder arthroplasty is again seen. The device is located. No fracture. Hardware is intact. Acromioclavicular osteoarthritis noted. IMPRESSION: Status post reverse shoulder arthroplasty. Negative for dislocation or other acute abnormality. Acromioclavicular osteoarthritis. Electronically Signed   By: Inge Rise M.D.   On: 03/19/2022 10:30    Disposition: Discharge  disposition: 01-Home or Self Care          Follow-up Information     Vanetta Mulders, MD Follow up.   Specialty: Orthopedic Surgery Contact information: [address] [place] [address] [phone]                  Signed: Vanetta Mulders 04/05/2022, 7:12 AM

## 2022-04-06 ENCOUNTER — Encounter (HOSPITAL_COMMUNITY): Payer: Self-pay | Admitting: Orthopaedic Surgery

## 2022-04-06 LAB — AEROBIC/ANAEROBIC CULTURE W GRAM STAIN (SURGICAL/DEEP WOUND)
Culture: NO GROWTH
Culture: NO GROWTH
Culture: NO GROWTH
Gram Stain: NONE SEEN
Gram Stain: NONE SEEN

## 2022-04-06 LAB — ACID FAST SMEAR (AFB, MYCOBACTERIA)
Acid Fast Smear: NEGATIVE
Acid Fast Smear: NEGATIVE
Acid Fast Smear: NEGATIVE

## 2022-04-08 ENCOUNTER — Encounter (HOSPITAL_BASED_OUTPATIENT_CLINIC_OR_DEPARTMENT_OTHER): Payer: Self-pay | Admitting: Physical Therapy

## 2022-04-08 ENCOUNTER — Ambulatory Visit (HOSPITAL_BASED_OUTPATIENT_CLINIC_OR_DEPARTMENT_OTHER): Payer: 59 | Attending: Orthopaedic Surgery | Admitting: Physical Therapy

## 2022-04-08 DIAGNOSIS — S46011A Strain of muscle(s) and tendon(s) of the rotator cuff of right shoulder, initial encounter: Secondary | ICD-10-CM | POA: Diagnosis not present

## 2022-04-08 DIAGNOSIS — M25511 Pain in right shoulder: Secondary | ICD-10-CM | POA: Insufficient documentation

## 2022-04-08 DIAGNOSIS — M25611 Stiffness of right shoulder, not elsewhere classified: Secondary | ICD-10-CM | POA: Diagnosis not present

## 2022-04-08 DIAGNOSIS — Z9889 Other specified postprocedural states: Secondary | ICD-10-CM | POA: Insufficient documentation

## 2022-04-08 NOTE — Therapy (Signed)
OUTPATIENT PHYSICAL THERAPY SHOULDER TREATMENT   Patient Name: Anna Keller MRN: [id number] DOB:Feb 05, 1971, 51 y.o., female Today's Date: 04/08/2022   PT End of Session - 04/08/22 1153     Visit Number 4    Number of Visits 25    Date for PT Re-Evaluation 07/02/22    Authorization Type [employer]    PT Start Time 1145    PT Stop Time 1225    PT Time Calculation (min) 40 min    Activity Tolerance Patient tolerated treatment well    Behavior During Therapy WFL for tasks assessed/performed               Past Medical History:  Diagnosis Date   ADHD (attention deficit hyperactivity disorder)    Ankylosing spondylitis ([place])    Anxiety    Arthritis    Depression    Eczema    Family history of adverse reaction to anesthesia    dad would be crazy, talk out of his head-per pt   Headache    Rheumatoid arthritis ([place])    Past Surgical History:  Procedure Laterality Date   BLADDER SUSPENSION N/A 04/14/2021   Procedure: TRANSVAGINAL TAPE (TVT) PROCEDURE;  Surgeon: Sanjuana Kava, MD;  Location: [place];  Service: Gynecology;  Laterality: N/A;  GYNCARE TVT EXACT   CYSTOSCOPY N/A 04/14/2021   Procedure: CYSTOSCOPY;  Surgeon: Sanjuana Kava, MD;  Location: [place];  Service: Gynecology;  Laterality: N/A;   KNEE ARTHROSCOPY Left 1988   REVERSE SHOULDER ARTHROPLASTY Right 01/05/2022   Procedure: RIGHT SHOULDER REVERSE SHOULDER ARTHROPLASTY;  Surgeon: Vanetta Mulders, MD;  Location: [place];  Service: Orthopedics;  Laterality: Right;   REVERSE SHOULDER ARTHROPLASTY Right 04/01/2022   Procedure: RIGHT REVERSE SHOULDER ARTHROPLASTY REVISION;  Surgeon: Vanetta Mulders, MD;  Location: [place];  Service: Orthopedics;  Laterality: Right;   TONSILLECTOMY     as a child   TOTAL LAPAROSCOPIC HYSTERECTOMY WITH SALPINGECTOMY Bilateral 04/14/2021   Procedure: TOTAL LAPAROSCOPIC HYSTERECTOMY WITH BILATERAL SALPINGECTOMY AND BILATERAL OOPHERECTOMY;  Surgeon: Sanjuana Kava, MD;  Location: [place];  Service:  Gynecology;  Laterality: Bilateral;   Patient Active Problem List   Diagnosis Date Noted   Status post reverse arthroplasty of right shoulder 04/01/2022   Unilateral primary osteoarthritis, left knee 01/27/2022   Traumatic complete tear of right rotator cuff    Fibroids 04/14/2021   S/P laparoscopic hysterectomy 04/14/2021   Grief reaction 03/23/2019   Gastroesophageal reflux disease without esophagitis 03/23/2019   Constipation 03/23/2019   Eczema 03/23/2019   Fibroid uterus 10/31/2018   Arthralgia 07/14/2018   Family history of arthritis 07/14/2018   Peeling skin 07/14/2018   Class 3 severe obesity due to excess calories with body mass index (BMI) of 40.0 to 44.9 in adult [facility]) 07/14/2018   Bright red rectal bleeding 07/14/2018   Anxiety 07/14/2018   Dysthymia 07/14/2018     REFERRING PROVIDER: Vanetta Mulders, MD  REFERRING DIAG: S46.011A (ICD-10-CM) - Traumatic complete tear of right rotator cuff, initial encounter Right R.TSA with biceps tenodesis, NO subscap repair  THERAPY DIAG:  Acute pain of right shoulder  Stiffness of right shoulder, not elsewhere classified  Rationale for Evaluation and Treatment Rehabilitation  ONSET DATE: DOS initial rTSA 01/05/22       DOS revision 04/01/22  SUBJECTIVE:  SUBJECTIVE STATEMENT: It hurts! It's worse than last time.   PERTINENT HISTORY: RA  PAIN:  Are you having pain? Yes: NPRS scale: a lot/10 Pain location: Rt shoulder Pain description: ache Aggravating factors: movement Relieving factors: rest  PRECAUTIONS: Shoulder  WEIGHT BEARING RESTRICTIONS Yes RTSA  NWB in sling through 12/14 per op note  FALLS:  Has patient fallen in last 6 months? Yes. 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  PLOF: Independent  PATIENT GOALS decr pain, use arm (Rt hand dominant)  OBJECTIVE:   PATIENT SURVEYS:  FOTO 35  POSTURE: 12/7: wearing sling without abd pillow  UPPER EXTREMITY ROM:    ROM  Right eval Right  10/12 Rt PROM 04/08/22  Shoulder flexion 60 (passive) 98 90  Shoulder extension     Shoulder abduction  46 25  Shoulder adduction     Shoulder internal rotation     Shoulder external rotation     (Blank rows = not tested)  UPPER EXTREMITY MMT:  Not appropriate to test at this time   PALPATION:  04/08/22: Guarded, upper trap tension Plapable tightness through middle deltoid   TODAY'S TREATMENT:  Treatment                            ERO 04/08/22:  PROM to shoulder STM to middle deltoid, upper trap Ktape post and middle deltoid, upper trap inhibition Scapular retraction Deep breathing through spasm   Treatment                            02/11/22:  Seated flexion with wand- stopping at 90 with cues to reduce elevation Seated OH press with wand Supine ABCs Manual scar tissue massage & IASTM Isometric flx/ext/abd   PATIENT EDUCATION: Education details: Anatomy of condition, POC, HEP, exercise form/rationale Person educated: Patient Education method: Consulting civil engineer, Demonstration, Tactile cues, Verbal cues, and Handouts Education comprehension: verbalized understanding, returned demonstration, verbal cues required, tactile cues required, and needs further education   HOME EXERCISE PROGRAM: [id number]  ASSESSMENT:  CLINICAL IMPRESSION: Patient presents to PT 1 week status post right reverse total shoulder revision surgery that was performed following multiple anterior dislocations of proximal humeral socket.  Incision is very tender to palpation and dressings were changed today without signs and symptoms of infection.  Patient is very guarded and very nervous about her motion that the shoulder will dislocate again.  Significant tightness in upper  trap as well as palpable tightness in middle deltoid belly when patient reported spasm and pain following passive range of motion.  She will be in her sling 1 more week with instruction for nonweightbearing and will continue to progress as tolerated.  Notes that there is a co-pay of $40 listed on her MyChart and I encouraged her to talk to support reps about this as she does not feel this is doable.  She did have to leave her job in light of having this revision surgery.   OBJECTIVE IMPAIRMENTS decreased activity tolerance, decreased mobility, decreased ROM, decreased strength, increased edema, increased muscle spasms, impaired flexibility, impaired UE functional use, improper body mechanics, postural dysfunction, and pain.   ACTIVITY LIMITATIONS carrying, lifting, transfers, bed mobility, dressing, and hygiene/grooming  PARTICIPATION LIMITATIONS: meal prep, cleaning, laundry, driving, shopping, community activity, and occupation  [place] and 1-2 comorbidities: RA, obesity  are also affecting patient's functional  outcome.   REHAB POTENTIAL: Good  CLINICAL DECISION MAKING: Stable/uncomplicated  EVALUATION COMPLEXITY: Low   GOALS: Goals reviewed with patient? Yes  SHORT TERM GOALS: Target date: 04/29/2022   1.  Active ER to 30 deg in standing without shoulder elevation Baseline: unable at eval Goal status: achieved  2.  AROM Elevation to at least 90 deg with good scapular control Baseline: see chart Goal status: achieved  3.  Passive flexion to 130 Baseline: see chart Goal status: achieved  4.  Able to demo full elbow AROM without incr pain Baseline: elbow and wrist motions begun at eval Goal status: achieved    LONG TERM GOALS: goal date # weeks post op, revision Days since surgery: 7   Fire all heads of deltoid Baseline: unable at eval Goal status: INITIAL Target date:   (4wk)  2.  Active shoulder elevation to 130 Baseline: see chart Goal status:  INITIAL Target date:   (6wk)  3.  Average pain <=3/10 in daily activities out of sling, aware of when to take rest breaks for pain managemenet Baseline: in sling at eval Goal status: INITIAL Target date:   (6wk)  4.  Demo ability to reach light objects, such as empty dishes, into cabinets above shoulder height with involved UE Baseline: unable at eval Goal status: INITIAL Target date:   (10 wk)  5.  Able to progress weight lifting tolerance to 10lb pain <=3/10 Baseline: will progress as appropriate Goal status: INITIAL Target date:   (10 wk)  6.  Able to demo at least 75% strength via hand held dynamometry testing in straight plane motions compared to opp UE Baseline: unable to test at eval Goal status: INITIAL Target date:   (12 wk)  7.  Able to return to work without limitations Baseline: unable at eval Goal status: INITIAL Target date:   (12 wk)   PLAN: PT FREQUENCY: 1-2x/week  PT DURATION: 12 weeks  PLANNED INTERVENTIONS: Therapeutic exercises, Therapeutic activity, Neuromuscular re-education, Patient/Family education, Self Care, Joint mobilization, Aquatic Therapy, Dry Needling, Electrical stimulation, Spinal mobilization, Cryotherapy, Moist heat, scar mobilization, Taping, Vasopneumatic device, Manual therapy, and Re-evaluation  PLAN FOR NEXT SESSION: NWB 1 more week, progress as tolerated  Jayleana Colberg C. Shavonda Wiedman PT, DPT 04/08/22 12:56 PM

## 2022-04-15 ENCOUNTER — Encounter (HOSPITAL_BASED_OUTPATIENT_CLINIC_OR_DEPARTMENT_OTHER): Payer: Self-pay | Admitting: Physical Therapy

## 2022-04-15 ENCOUNTER — Ambulatory Visit (INDEPENDENT_AMBULATORY_CARE_PROVIDER_SITE_OTHER): Payer: 59

## 2022-04-15 ENCOUNTER — Ambulatory Visit (HOSPITAL_BASED_OUTPATIENT_CLINIC_OR_DEPARTMENT_OTHER): Payer: 59 | Admitting: Physical Therapy

## 2022-04-15 ENCOUNTER — Ambulatory Visit (INDEPENDENT_AMBULATORY_CARE_PROVIDER_SITE_OTHER): Payer: 59 | Admitting: Orthopaedic Surgery

## 2022-04-15 DIAGNOSIS — M25611 Stiffness of right shoulder, not elsewhere classified: Secondary | ICD-10-CM

## 2022-04-15 DIAGNOSIS — S46011A Strain of muscle(s) and tendon(s) of the rotator cuff of right shoulder, initial encounter: Secondary | ICD-10-CM | POA: Diagnosis not present

## 2022-04-15 DIAGNOSIS — Z9889 Other specified postprocedural states: Secondary | ICD-10-CM

## 2022-04-15 DIAGNOSIS — M25511 Pain in right shoulder: Secondary | ICD-10-CM

## 2022-04-15 DIAGNOSIS — Z96611 Presence of right artificial shoulder joint: Secondary | ICD-10-CM | POA: Diagnosis not present

## 2022-04-15 NOTE — Progress Notes (Signed)
Post Operative Evaluation    Procedure/Date of Surgery: Right revision shoulder arthroplasty 04/01/22  Interval History:   Presents today 2 weeks status post right shoulder revision arthroplasty for recurrent instability.  Overall she denies any recurrent episodes of instability.  She is having some soreness and swelling about the deltoid but overall is doing quite well.  She did participate in physical therapy this morning.  Here today for further assessment.  Cultures have remained no growth to date  PMH/PSH/Family History/Social History/Meds/Allergies:    Past Medical History:  Diagnosis Date   ADHD (attention deficit hyperactivity disorder)    Ankylosing spondylitis ([place])    Anxiety    Arthritis    Depression    Eczema    Family history of adverse reaction to anesthesia    dad would be crazy, talk out of his head-per pt   Headache    Rheumatoid arthritis ([place])    Past Surgical History:  Procedure Laterality Date   BLADDER SUSPENSION N/A 04/14/2021   Procedure: TRANSVAGINAL TAPE (TVT) PROCEDURE;  Surgeon: Sanjuana Kava, MD;  Location: [place];  Service: Gynecology;  Laterality: N/A;  GYNCARE TVT EXACT   CYSTOSCOPY N/A 04/14/2021   Procedure: CYSTOSCOPY;  Surgeon: Sanjuana Kava, MD;  Location: [place];  Service: Gynecology;  Laterality: N/A;   KNEE ARTHROSCOPY Left 1988   REVERSE SHOULDER ARTHROPLASTY Right 01/05/2022   Procedure: RIGHT SHOULDER REVERSE SHOULDER ARTHROPLASTY;  Surgeon: Vanetta Mulders, MD;  Location: [place];  Service: Orthopedics;  Laterality: Right;   REVERSE SHOULDER ARTHROPLASTY Right 04/01/2022   Procedure: RIGHT REVERSE SHOULDER ARTHROPLASTY REVISION;  Surgeon: Vanetta Mulders, MD;  Location: [place];  Service: Orthopedics;  Laterality: Right;   TONSILLECTOMY     as a child   TOTAL LAPAROSCOPIC HYSTERECTOMY WITH SALPINGECTOMY Bilateral 04/14/2021   Procedure: TOTAL LAPAROSCOPIC HYSTERECTOMY WITH BILATERAL SALPINGECTOMY AND  BILATERAL OOPHERECTOMY;  Surgeon: Sanjuana Kava, MD;  Location: [place];  Service: Gynecology;  Laterality: Bilateral;   Social History   Socioeconomic History   Marital status: Married    Spouse name: Legrand [place]o   Number of children: 1   Years of education: 12   Highest education level: High school graduate  Occupational History   Not on file  Tobacco Use   Smoking status: Former    Packs/day: 1.00    Years: 25.00    Total pack years: 25.00    Types: Cigarettes    Quit date: 01/09/2016    Years since quitting: 6.2   Smokeless tobacco: Never  Vaping Use   Vaping Use: Never used  Substance and Sexual Activity   Alcohol use: Not Currently   Drug use: No   Sexual activity: Yes    Partners: Male    Birth control/protection: None  Other Topics Concern   Not on file  Social History Narrative   Not on file   Social Determinants of Health   Financial Resource Strain: Low Risk  (07/14/2018)   Overall Financial Resource Strain (CARDIA)    Difficulty of Paying Living Expenses: Not hard at all  Food Insecurity: No Food Insecurity (07/14/2018)   Hunger Vital Sign    Worried About Running Out of Food in the Last Year: Never true    [place] in the Last Year: Never true  Transportation Needs: No Transportation Needs (04/02/2022)  PRAPARE - Hydrologist (Medical): No    Lack of Transportation (Non-Medical): No  Physical Activity: Inactive (07/14/2018)   Exercise Vital Sign    Days of Exercise per Week: 0 days    Minutes of Exercise per Session: 0 min  Stress: No Stress Concern Present (07/14/2018)   [place]    Feeling of Stress : Only a little  Social Connections: Somewhat Isolated (07/14/2018)   Social Connection and Isolation Panel [NHANES]    Frequency of Communication with Friends and Family: More than three times a week    Frequency of Social Gatherings with Friends and Family: More  than three times a week    Attends Religious Services: Never    Marine scientist or Organizations: No    Attends Music therapist: Never    Marital Status: Married   Family History  Problem Relation Age of Onset   Hypertension Mother    Arthritis Father    Heart disease Sister    Pulmonary Hypertension Sister    COPD Sister    Diabetes Sister    Breast cancer Maternal Grandmother    Heart attack Paternal Grandmother    Breast cancer Maternal Aunt    Breast cancer Cousin    No Known Allergies Current Outpatient Medications  Medication Sig Dispense Refill   ALPRAZolam (XANAX) 1 MG tablet Take 1 mg by mouth 3 (three) times daily as needed for anxiety.     amphetamine-dextroamphetamine (ADDERALL) 20 MG tablet Take 20 mg by mouth 2 (two) times daily.     aspirin EC 325 MG tablet Take 1 tablet (325 mg total) by mouth daily. 30 tablet 0   busPIRone (BUSPAR) 7.5 MG tablet Take 7.5 mg by mouth 3 (three) times daily.     Calcium Carb-Cholecalciferol (CALCIUM 600+D3 PO) Take 1 tablet by mouth 3 (three) times daily.     cholecalciferol (VITAMIN D3) 25 MCG (1000 UNIT) tablet Take 1,000 Units by mouth daily.     clobetasol cream (TEMOVATE) 5.09 % Apply 1 application  topically daily as needed (irritation).     diclofenac (VOLTAREN) 50 MG EC tablet Take 50 mg by mouth 2 (two) times daily.     doxycycline (VIBRAMYCIN) 50 MG capsule Take 1 capsule (50 mg total) by mouth 2 (two) times daily for 14 days. 28 capsule 0   levocetirizine (XYZAL) 5 MG tablet Take 5 mg by mouth every evening.     Multiple Vitamins-Minerals (MULTIVITAMIN WITH MINERALS) tablet Take 1 tablet by mouth daily.     omeprazole (PRILOSEC) 20 MG capsule Take 1 capsule by mouth once daily 90 capsule 0   oxyCODONE (ROXICODONE) 5 MG immediate release tablet Take 1 tablet (5 mg total) by mouth every 4 (four) hours as needed for severe pain or breakthrough pain. 30 tablet 0   polyethylene glycol powder  (GLYCOLAX/MIRALAX) 17 GM/SCOOP powder Take 17 g by mouth daily. 3350 g 3   rosuvastatin (CRESTOR) 40 MG tablet Take 40 mg by mouth every evening.     traZODone (DESYREL) 150 MG tablet Take 150 mg by mouth at bedtime as needed for sleep.     venlafaxine XR (EFFEXOR-XR) 150 MG 24 hr capsule Take 1 capsule (150 mg total) by mouth daily. (Patient taking differently: Take 300 mg by mouth daily with breakfast.) 60 capsule 6   No current facility-administered medications for this visit.   No results found.  Review of  Systems:   A ROS was performed including pertinent positives and negatives as documented in the HPI.   Musculoskeletal Exam:     Right shoulder incision is well-appearing without erythema or drainage.  She able to flex and extend at the right elbow.  In the supine position she is able to forward elevate to 90 degrees with external rotation at side to 20 degrees.  Internal rotation deferred today  Imaging:    3 views right shoulder: Status post revision arthroplasty without evidence of complication  I personally reviewed and interpreted the radiographs.   Assessment:   51 year old female who is 2 weeks status post right revision shoulder arthroplasty without any evidence of recurring instability at this time.  Cultures were no growth.  That effect we will plan to limit her internal rotation for an additional 2 weeks.  She will continue to advance according to rehab protocol at this time.  I will plan to see her back in 2 weeks for reassessment  Plan :    -Return to clinic in 2 weeks for reassessment      I personally saw and evaluated the patient, and participated in the management and treatment plan.  Vanetta Mulders, MD Attending Physician, Orthopedic Surgery  This document was dictated using Dragon voice recognition software. A reasonable attempt at proof reading has been made to minimize errors.

## 2022-04-15 NOTE — Therapy (Signed)
OUTPATIENT PHYSICAL THERAPY SHOULDER TREATMENT   Patient Name: Anna Keller MRN: [id number] DOB:07/26/1970, 51 y.o., female Today's Date: 04/15/2022   PT End of Session - 04/15/22 0928     Visit Number 5    Number of Visits 25    Date for PT Re-Evaluation 07/02/22    Authorization Type Aet[employer]    PT Start Time 0900    PT Stop Time 0929    PT Time Calculation (min) 29 min    Activity Tolerance Patient limited by pain    Behavior During Therapy [facility] for tasks assessed/performed                Past Medical History:  Diagnosis Date   ADHD (attention deficit hyperactivity disorder)    Ankylosing spondylitis ([place])    Anxiety    Arthritis    Depression    Eczema    Family history of adverse reaction to anesthesia    dad would be crazy, talk out of his head-per pt   Headache    Rheumatoid arthritis ([place])    Past Surgical History:  Procedure Laterality Date   BLADDER SUSPENSION N/A 04/14/2021   Procedure: TRANSVAGINAL TAPE (TVT) PROCEDURE;  Surgeon: Sanjuana Kava, MD;  Location: [place];  Service: Gynecology;  Laterality: N/A;  GYNCARE TVT EXACT   CYSTOSCOPY N/A 04/14/2021   Procedure: CYSTOSCOPY;  Surgeon: Sanjuana Kava, MD;  Location: [place];  Service: Gynecology;  Laterality: N/A;   KNEE ARTHROSCOPY Left 1988   REVERSE SHOULDER ARTHROPLASTY Right 01/05/2022   Procedure: RIGHT SHOULDER REVERSE SHOULDER ARTHROPLASTY;  Surgeon: Vanetta Mulders, MD;  Location: [place];  Service: Orthopedics;  Laterality: Right;   REVERSE SHOULDER ARTHROPLASTY Right 04/01/2022   Procedure: RIGHT REVERSE SHOULDER ARTHROPLASTY REVISION;  Surgeon: Vanetta Mulders, MD;  Location: [place];  Service: Orthopedics;  Laterality: Right;   TONSILLECTOMY     as a child   TOTAL LAPAROSCOPIC HYSTERECTOMY WITH SALPINGECTOMY Bilateral 04/14/2021   Procedure: TOTAL LAPAROSCOPIC HYSTERECTOMY WITH BILATERAL SALPINGECTOMY AND BILATERAL OOPHERECTOMY;  Surgeon: Sanjuana Kava, MD;  Location: [place];  Service: Gynecology;   Laterality: Bilateral;   Patient Active Problem List   Diagnosis Date Noted   Status post reverse arthroplasty of right shoulder 04/01/2022   Unilateral primary osteoarthritis, left knee 01/27/2022   Traumatic complete tear of right rotator cuff    Fibroids 04/14/2021   S/P laparoscopic hysterectomy 04/14/2021   Grief reaction 03/23/2019   Gastroesophageal reflux disease without esophagitis 03/23/2019   Constipation 03/23/2019   Eczema 03/23/2019   Fibroid uterus 10/31/2018   Arthralgia 07/14/2018   Family history of arthritis 07/14/2018   Peeling skin 07/14/2018   Class 3 severe obesity due to excess calories with body mass index (BMI) of 40.0 to 44.9 in adult ([place]) 07/14/2018   Bright red rectal bleeding 07/14/2018   Anxiety 07/14/2018   Dysthymia 07/14/2018     REFERRING PROVIDER: Vanetta Mulders, MD  REFERRING DIAG: S46.011A (ICD-10-CM) - Traumatic complete tear of right rotator cuff, initial encounter Right R.TSA with biceps tenodesis, NO subscap repair  THERAPY DIAG:  Acute pain of right shoulder  Stiffness of right shoulder, not elsewhere classified  Traumatic complete tear of right rotator cuff, initial encounter  Rationale for Evaluation and Treatment Rehabilitation  ONSET DATE: DOS initial rTSA 01/05/22       DOS revision 04/01/22  SUBJECTIVE:  SUBJECTIVE STATEMENT:  My shoulder's here. I'm having a charlie horse in whatever muscle Janett Billow wrote down last time. Nothing new going on in general. Haven't had any RA meds since July. I'm over not having RA medicine. Duanne Limerick is bothering my skin really itching.   PERTINENT HISTORY: RA  PAIN:  Are you having pain? Yes: NPRS scale: 20/10 Pain location: whole body Pain description: aches, pains, body ate up with RA  Aggravating factors:  movement Relieving factors: rest  PRECAUTIONS: Shoulder  WEIGHT BEARING RESTRICTIONS Yes RTSA  NWB in sling through 12/14 per op note  FALLS:  Has patient fallen in last 6 months? Yes. 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  PLOF: Independent  PATIENT GOALS decr pain, use arm (Rt hand dominant)  OBJECTIVE:   PATIENT SURVEYS:  FOTO 35  POSTURE: 12/7: wearing sling without abd pillow  UPPER EXTREMITY ROM:    ROM  Right eval Right  10/12 Rt PROM 04/08/22  Shoulder flexion 60 (passive) 98 90  Shoulder extension     Shoulder abduction  46 25  Shoulder adduction     Shoulder internal rotation     Shoulder external rotation     (Blank rows = not tested)  UPPER EXTREMITY MMT:  Not appropriate to test at this time   PALPATION:  04/08/22: Guarded, upper trap tension Plapable tightness through middle deltoid   TODAY'S TREATMENT:   04/15/22  Spent considerable amount of time taking off Ktape/applying shea lotion, educating on skin care/skin reaction to tape   TherEx  PROM flexion to 90-110* limited by deltoid spasms/pain PROM ABD to 60-80* limited by deltoid spasms/pain     Treatment                            ERO 04/08/22:  PROM to shoulder STM to middle deltoid, upper trap Ktape post and middle deltoid, upper trap inhibition Scapular retraction Deep breathing through spasm   Treatment                            02/11/22:  Seated flexion with wand- stopping at 90 with cues to reduce elevation Seated OH press with wand Supine ABCs Manual scar tissue massage & IASTM Isometric flx/ext/abd   PATIENT EDUCATION: Education details: Anatomy of condition, POC, HEP, exercise form/rationale Person educated: Patient Education method: Explanation, Demonstration, Tactile cues, Verbal cues, and Handouts Education comprehension: verbalized understanding, returned demonstration, verbal cues required,  tactile cues required, and needs further education   HOME EXERCISE PROGRAM: [id number]  ASSESSMENT:  CLINICAL IMPRESSION:  Ryann arrives quite late today, reports terrible itching from [place]. I spent a lot of time today removing tape as able, did note skin reaction to tape and applied shea butter/bees wax lotion to try to calm skin. Otherwise focused on PROM. Will continue to progress as able and tolerated. Did not replace dressings as we were short on time and she is going to see MD immediately after PT today anyway.    OBJECTIVE IMPAIRMENTS decreased activity tolerance, decreased mobility, decreased ROM, decreased strength, increased edema, increased muscle spasms, impaired flexibility, impaired UE functional use, improper body mechanics, postural dysfunction, and pain.   ACTIVITY LIMITATIONS carrying, lifting, transfers, bed mobility, dressing, and hygiene/grooming  PARTICIPATION LIMITATIONS: meal prep, cleaning, laundry, driving, shopping, community activity, and occupation  [place] and 1-2 comorbidities: RA, obesity  are also affecting patient's functional outcome.   REHAB POTENTIAL: Good  CLINICAL DECISION MAKING: Stable/uncomplicated  EVALUATION COMPLEXITY: Low   GOALS: Goals reviewed with patient? Yes  SHORT TERM GOALS: Target date: 04/29/2022   1.  Active ER to 30 deg in standing without shoulder elevation Baseline: unable at eval Goal status: achieved  2.  AROM Elevation to at least 90 deg with good scapular control Baseline: see chart Goal status: achieved  3.  Passive flexion to 130 Baseline: see chart Goal status: achieved  4.  Able to demo full elbow AROM without incr pain Baseline: elbow and wrist motions begun at eval Goal status: achieved    LONG TERM GOALS: goal date # weeks post op, revision Days since surgery: 14   Fire all heads of deltoid Baseline: unable at eval Goal status: INITIAL Target date:   (4wk)  2.  Active  shoulder elevation to 130 Baseline: see chart Goal status: INITIAL Target date:   (6wk)  3.  Average pain <=3/10 in daily activities out of sling, aware of when to take rest breaks for pain managemenet Baseline: in sling at eval Goal status: INITIAL Target date:   (6wk)  4.  Demo ability to reach light objects, such as empty dishes, into cabinets above shoulder height with involved UE Baseline: unable at eval Goal status: INITIAL Target date:   (10 wk)  5.  Able to progress weight lifting tolerance to 10lb pain <=3/10 Baseline: will progress as appropriate Goal status: INITIAL Target date:   (10 wk)  6.  Able to demo at least 75% strength via hand held dynamometry testing in straight plane motions compared to opp UE Baseline: unable to test at eval Goal status: INITIAL Target date:   (12 wk)  7.  Able to return to work without limitations Baseline: unable at eval Goal status: INITIAL Target date:   (12 wk)   PLAN: PT FREQUENCY: 1-2x/week  PT DURATION: 12 weeks  PLANNED INTERVENTIONS: Therapeutic exercises, Therapeutic activity, Neuromuscular re-education, Patient/Family education, Self Care, Joint mobilization, Aquatic Therapy, Dry Needling, Electrical stimulation, Spinal mobilization, Cryotherapy, Moist heat, scar mobilization, Taping, Vasopneumatic device, Manual therapy, and Re-evaluation  PLAN FOR NEXT SESSION: NWB 1 more week, progress as tolerated do not use ktape, has skin reaction/allergy     Uriah Trueba U PT DPT PN2  04/15/2022, 9:29 AM

## 2022-04-19 ENCOUNTER — Ambulatory Visit (HOSPITAL_BASED_OUTPATIENT_CLINIC_OR_DEPARTMENT_OTHER): Payer: 59 | Admitting: Physical Therapy

## 2022-04-19 ENCOUNTER — Encounter (HOSPITAL_BASED_OUTPATIENT_CLINIC_OR_DEPARTMENT_OTHER): Payer: Self-pay | Admitting: Physical Therapy

## 2022-04-22 ENCOUNTER — Ambulatory Visit (HOSPITAL_BASED_OUTPATIENT_CLINIC_OR_DEPARTMENT_OTHER): Payer: 59 | Admitting: Physical Therapy

## 2022-04-22 ENCOUNTER — Encounter (HOSPITAL_BASED_OUTPATIENT_CLINIC_OR_DEPARTMENT_OTHER): Payer: Self-pay | Admitting: Physical Therapy

## 2022-04-22 DIAGNOSIS — S46011A Strain of muscle(s) and tendon(s) of the rotator cuff of right shoulder, initial encounter: Secondary | ICD-10-CM | POA: Diagnosis not present

## 2022-04-22 DIAGNOSIS — M25511 Pain in right shoulder: Secondary | ICD-10-CM

## 2022-04-22 DIAGNOSIS — M25611 Stiffness of right shoulder, not elsewhere classified: Secondary | ICD-10-CM

## 2022-04-22 DIAGNOSIS — Z79899 Other long term (current) drug therapy: Secondary | ICD-10-CM | POA: Diagnosis not present

## 2022-04-22 DIAGNOSIS — R21 Rash and other nonspecific skin eruption: Secondary | ICD-10-CM | POA: Diagnosis not present

## 2022-04-22 DIAGNOSIS — M2550 Pain in unspecified joint: Secondary | ICD-10-CM | POA: Diagnosis not present

## 2022-04-22 DIAGNOSIS — M1990 Unspecified osteoarthritis, unspecified site: Secondary | ICD-10-CM | POA: Diagnosis not present

## 2022-04-22 DIAGNOSIS — M0609 Rheumatoid arthritis without rheumatoid factor, multiple sites: Secondary | ICD-10-CM | POA: Diagnosis not present

## 2022-04-22 DIAGNOSIS — M7989 Other specified soft tissue disorders: Secondary | ICD-10-CM | POA: Diagnosis not present

## 2022-04-22 DIAGNOSIS — Z9889 Other specified postprocedural states: Secondary | ICD-10-CM | POA: Diagnosis not present

## 2022-04-22 DIAGNOSIS — L309 Dermatitis, unspecified: Secondary | ICD-10-CM | POA: Diagnosis not present

## 2022-04-22 DIAGNOSIS — M79643 Pain in unspecified hand: Secondary | ICD-10-CM | POA: Diagnosis not present

## 2022-04-22 NOTE — Therapy (Signed)
OUTPATIENT PHYSICAL THERAPY SHOULDER TREATMENT   Patient Name: Anna Keller MRN: [id number] DOB:Apr 02, 1971, 51 y.o., female Today's Date: 04/22/2022   PT End of Session - 04/22/22 1439     Visit Number 6    Number of Visits 25    Date for PT Re-Evaluation 07/02/22    Authorization Type Aetna    PT Start Time 1436    PT Stop Time 1500    PT Time Calculation (min) 24 min    Activity Tolerance Patient limited by pain    Behavior During Therapy Anxious                Past Medical History:  Diagnosis Date   ADHD (attention deficit hyperactivity disorder)    Ankylosing spondylitis ([place])    Anxiety    Arthritis    Depression    Eczema    Family history of adverse reaction to anesthesia    dad would be crazy, talk out of his head-per pt   Headache    Rheumatoid arthritis ([place])    Past Surgical History:  Procedure Laterality Date   BLADDER SUSPENSION N/A 04/14/2021   Procedure: TRANSVAGINAL TAPE (TVT) PROCEDURE;  Surgeon: Sanjuana Kava, MD;  Location: [place];  Service: Gynecology;  Laterality: N/A;  GYNCARE TVT EXACT   CYSTOSCOPY N/A 04/14/2021   Procedure: CYSTOSCOPY;  Surgeon: Sanjuana Kava, MD;  Location: [place];  Service: Gynecology;  Laterality: N/A;   KNEE ARTHROSCOPY Left 1988   REVERSE SHOULDER ARTHROPLASTY Right 01/05/2022   Procedure: RIGHT SHOULDER REVERSE SHOULDER ARTHROPLASTY;  Surgeon: Vanetta Mulders, MD;  Location: [place];  Service: Orthopedics;  Laterality: Right;   REVERSE SHOULDER ARTHROPLASTY Right 04/01/2022   Procedure: RIGHT REVERSE SHOULDER ARTHROPLASTY REVISION;  Surgeon: Vanetta Mulders, MD;  Location: [place];  Service: Orthopedics;  Laterality: Right;   TONSILLECTOMY     as a child   TOTAL LAPAROSCOPIC HYSTERECTOMY WITH SALPINGECTOMY Bilateral 04/14/2021   Procedure: TOTAL LAPAROSCOPIC HYSTERECTOMY WITH BILATERAL SALPINGECTOMY AND BILATERAL OOPHERECTOMY;  Surgeon: Sanjuana Kava, MD;  Location: [place];  Service: Gynecology;  Laterality: Bilateral;    Patient Active Problem List   Diagnosis Date Noted   Status post reverse arthroplasty of right shoulder 04/01/2022   Unilateral primary osteoarthritis, left knee 01/27/2022   Traumatic complete tear of right rotator cuff    Fibroids 04/14/2021   S/P laparoscopic hysterectomy 04/14/2021   Grief reaction 03/23/2019   Gastroesophageal reflux disease without esophagitis 03/23/2019   Constipation 03/23/2019   Eczema 03/23/2019   Fibroid uterus 10/31/2018   Arthralgia 07/14/2018   Family history of arthritis 07/14/2018   Peeling skin 07/14/2018   Class 3 severe obesity due to excess calories with body mass index (BMI) of 40.0 to 44.9 in adult ([place]) 07/14/2018   Bright red rectal bleeding 07/14/2018   Anxiety 07/14/2018   Dysthymia 07/14/2018     REFERRING PROVIDER: Vanetta Mulders, MD  REFERRING DIAG: S46.011A (ICD-10-CM) - Traumatic complete tear of right rotator cuff, initial encounter Right R.TSA with biceps tenodesis, NO subscap repair  THERAPY DIAG:  Acute pain of right shoulder  Stiffness of right shoulder, not elsewhere classified  Traumatic complete tear of right rotator cuff, initial encounter  Rationale for Evaluation and Treatment Rehabilitation  ONSET DATE: DOS initial rTSA 01/05/22       DOS revision 04/01/22  SUBJECTIVE:  SUBJECTIVE STATEMENT:  Have to leave by 3 for appt. I can barely lift my arm, I have to prop it on the counter to put on deoderant. Something does not feel right in this muscle (rubbing mid belly of deltoid). I have to wear my sling at night  PERTINENT HISTORY: RA  PAIN:  Are you having pain? Yes: NPRS scale: 20/10 Pain location: whole body Pain description: aches, pains, body ate up with RA  Aggravating factors: movement Relieving factors: rest  PRECAUTIONS:  Shoulder  WEIGHT BEARING RESTRICTIONS Yes RTSA  NWB in sling through 12/14 per op note  FALLS:  Has patient fallen in last 6 months? Yes. 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  PLOF: Independent  PATIENT GOALS decr pain, use arm (Rt hand dominant)  OBJECTIVE:   PATIENT SURVEYS:  FOTO 35  POSTURE: 12/7: wearing sling without abd pillow  UPPER EXTREMITY ROM:    ROM  Right eval Right  10/12 Rt PROM 04/08/22  Shoulder flexion 60 (passive) 98 90  Shoulder extension     Shoulder abduction  46 25  Shoulder adduction     Shoulder internal rotation     Shoulder external rotation     (Blank rows = not tested)  UPPER EXTREMITY MMT:  Not appropriate to test at this time   PALPATION:  04/08/22: Guarded, upper trap tension Plapable tightness through middle deltoid   TODAY'S TREATMENT:   Treatment                            04/22/22:  PROM- tolerated flexion to 90, abd to 45 STM to upper trap, deltoid, pecs Incision mobilization   04/15/22  Spent considerable amount of time taking off Ktape/applying shea lotion, educating on skin care/skin reaction to tape   TherEx  PROM flexion to 90-110* limited by deltoid spasms/pain PROM ABD to 60-80* limited by deltoid spasms/pain     Treatment                            ERO 04/08/22:  PROM to shoulder STM to middle deltoid, upper trap Ktape post and middle deltoid, upper trap inhibition Scapular retraction Deep breathing through spasm   Treatment                            02/11/22:  Seated flexion with wand- stopping at 90 with cues to reduce elevation Seated OH press with wand Supine ABCs Manual scar tissue massage & IASTM Isometric flx/ext/abd   PATIENT EDUCATION: Education details: Anatomy of condition, POC, HEP, exercise form/rationale Person educated: Patient Education method: Explanation, Demonstration, Tactile cues, Verbal cues, and  Handouts Education comprehension: verbalized understanding, returned demonstration, verbal cues required, tactile cues required, and needs further education   HOME EXERCISE PROGRAM: [id number]  ASSESSMENT:  CLINICAL IMPRESSION:  Pt is able to demo proper scapular retraction which I asked her to continue to focus on to encourage posterior support to humerus. Tends to lift arm with IR due to weakness in biceps creating increased discomfort at deltoid when trying to perform forward elevation. Does have significant RA symptoms flaring up and seeing rheumatologist, considered possibility that inflammation is contributing to severe pain levels or rejection in worst scenario but will require addressing from a specialist. Will continue to progress as tolerated.    OBJECTIVE  IMPAIRMENTS decreased activity tolerance, decreased mobility, decreased ROM, decreased strength, increased edema, increased muscle spasms, impaired flexibility, impaired UE functional use, improper body mechanics, postural dysfunction, and pain.   ACTIVITY LIMITATIONS carrying, lifting, transfers, bed mobility, dressing, and hygiene/grooming  PARTICIPATION LIMITATIONS: meal prep, cleaning, laundry, driving, shopping, community activity, and occupation  [place] and 1-2 comorbidities: RA, obesity  are also affecting patient's functional outcome.   REHAB POTENTIAL: Good  CLINICAL DECISION MAKING: Stable/uncomplicated  EVALUATION COMPLEXITY: Low   GOALS: Goals reviewed with patient? Yes  SHORT TERM GOALS: Target date: 04/29/2022   1.  Active ER to 30 deg in standing without shoulder elevation Baseline: unable at eval Goal status: achieved  2.  AROM Elevation to at least 90 deg with good scapular control Baseline: see chart Goal status: achieved  3.  Passive flexion to 130 Baseline: see chart Goal status: achieved  4.  Able to demo full elbow AROM without incr pain Baseline: elbow and wrist  motions begun at eval Goal status: achieved    LONG TERM GOALS: goal date # weeks post op, revision Days since surgery: 21   Fire all heads of deltoid Baseline: unable at eval Goal status: INITIAL Target date:   (4wk)  2.  Active shoulder elevation to 130 Baseline: see chart Goal status: INITIAL Target date:   (6wk)  3.  Average pain <=3/10 in daily activities out of sling, aware of when to take rest breaks for pain managemenet Baseline: in sling at eval Goal status: INITIAL Target date:   (6wk)  4.  Demo ability to reach light objects, such as empty dishes, into cabinets above shoulder height with involved UE Baseline: unable at eval Goal status: INITIAL Target date:   (10 wk)  5.  Able to progress weight lifting tolerance to 10lb pain <=3/10 Baseline: will progress as appropriate Goal status: INITIAL Target date:   (10 wk)  6.  Able to demo at least 75% strength via hand held dynamometry testing in straight plane motions compared to opp UE Baseline: unable to test at eval Goal status: INITIAL Target date:   (12 wk)  7.  Able to return to work without limitations Baseline: unable at eval Goal status: INITIAL Target date:   (12 wk)   PLAN: PT FREQUENCY: 1-2x/week  PT DURATION: 12 weeks  PLANNED INTERVENTIONS: Therapeutic exercises, Therapeutic activity, Neuromuscular re-education, Patient/Family education, Self Care, Joint mobilization, Aquatic Therapy, Dry Needling, Electrical stimulation, Spinal mobilization, Cryotherapy, Moist heat, scar mobilization, Taping, Vasopneumatic device, Manual therapy, and Re-evaluation  PLAN FOR NEXT SESSION: NWB 1 more week, progress as tolerated do not use ktape, has skin reaction/allergy    Sanam Marmo C. Porshea Janowski PT, DPT 04/22/22 3:10 PM

## 2022-04-28 ENCOUNTER — Ambulatory Visit (HOSPITAL_BASED_OUTPATIENT_CLINIC_OR_DEPARTMENT_OTHER): Payer: 59 | Admitting: Physical Therapy

## 2022-04-28 ENCOUNTER — Other Ambulatory Visit (HOSPITAL_BASED_OUTPATIENT_CLINIC_OR_DEPARTMENT_OTHER): Payer: Self-pay

## 2022-04-28 ENCOUNTER — Ambulatory Visit (INDEPENDENT_AMBULATORY_CARE_PROVIDER_SITE_OTHER): Payer: 59 | Admitting: Orthopaedic Surgery

## 2022-04-28 ENCOUNTER — Encounter (HOSPITAL_BASED_OUTPATIENT_CLINIC_OR_DEPARTMENT_OTHER): Payer: Self-pay | Admitting: Physical Therapy

## 2022-04-28 DIAGNOSIS — M25511 Pain in right shoulder: Secondary | ICD-10-CM | POA: Diagnosis not present

## 2022-04-28 DIAGNOSIS — Z96611 Presence of right artificial shoulder joint: Secondary | ICD-10-CM

## 2022-04-28 DIAGNOSIS — S46011A Strain of muscle(s) and tendon(s) of the rotator cuff of right shoulder, initial encounter: Secondary | ICD-10-CM | POA: Diagnosis not present

## 2022-04-28 DIAGNOSIS — M25611 Stiffness of right shoulder, not elsewhere classified: Secondary | ICD-10-CM | POA: Diagnosis not present

## 2022-04-28 DIAGNOSIS — Z9889 Other specified postprocedural states: Secondary | ICD-10-CM | POA: Diagnosis not present

## 2022-04-28 MED ORDER — TRAMADOL HCL 50 MG PO TABS
50.0000 mg | ORAL_TABLET | Freq: Two times a day (BID) | ORAL | 2 refills | Status: DC | PRN
  Filled 2022-04-28: qty 30, 15d supply, fill #0
  Filled 2022-05-13: qty 30, 15d supply, fill #1

## 2022-04-28 NOTE — Therapy (Signed)
OUTPATIENT PHYSICAL THERAPY SHOULDER TREATMENT   Patient Name: Anna Keller MRN: [id number] DOB:[birth year], 51 y.o., female Today's Date: 04/28/2022   PT End of Session - 04/28/22 0959     Visit Number 7    Number of Visits 25    Date for PT Re-Evaluation 07/02/22    Authorization Type [employer]    PT Start Time [phone]    PT Stop Time 1014    PT Time Calculation (min) 47 min    Activity Tolerance Patient limited by pain    Behavior During Therapy Anxious                 Past Medical History:  Diagnosis Date   ADHD (attention deficit hyperactivity disorder)    Ankylosing spondylitis ([place])    Anxiety    Arthritis    Depression    Eczema    Family history of adverse reaction to anesthesia    dad would be crazy, talk out of his head-per pt   Headache    Rheumatoid arthritis ([place])    Past Surgical History:  Procedure Laterality Date   BLADDER SUSPENSION N/A 04/14/2021   Procedure: TRANSVAGINAL TAPE (TVT) PROCEDURE;  Surgeon: Sanjuana Kava, MD;  Location: [place];  Service: Gynecology;  Laterality: N/A;  GYNCARE TVT EXACT   CYSTOSCOPY N/A 04/14/2021   Procedure: CYSTOSCOPY;  Surgeon: Sanjuana Kava, MD;  Location: [place];  Service: Gynecology;  Laterality: N/A;   KNEE ARTHROSCOPY Left 1988   REVERSE SHOULDER ARTHROPLASTY Right 01/05/2022   Procedure: RIGHT SHOULDER REVERSE SHOULDER ARTHROPLASTY;  Surgeon: Vanetta Mulders, MD;  Location: [place];  Service: Orthopedics;  Laterality: Right;   REVERSE SHOULDER ARTHROPLASTY Right 04/01/2022   Procedure: RIGHT REVERSE SHOULDER ARTHROPLASTY REVISION;  Surgeon: Vanetta Mulders, MD;  Location: [place];  Service: Orthopedics;  Laterality: Right;   TONSILLECTOMY     as a child   TOTAL LAPAROSCOPIC HYSTERECTOMY WITH SALPINGECTOMY Bilateral 04/14/2021   Procedure: TOTAL LAPAROSCOPIC HYSTERECTOMY WITH BILATERAL SALPINGECTOMY AND BILATERAL OOPHERECTOMY;  Surgeon: Sanjuana Kava, MD;  Location: [place];  Service: Gynecology;  Laterality: Bilateral;    Patient Active Problem List   Diagnosis Date Noted   Status post reverse arthroplasty of right shoulder 04/01/2022   Unilateral primary osteoarthritis, left knee 01/27/2022   Traumatic complete tear of right rotator cuff    Fibroids 04/14/2021   S/P laparoscopic hysterectomy 04/14/2021   Grief reaction 03/23/2019   Gastroesophageal reflux disease without esophagitis 03/23/2019   Constipation 03/23/2019   Eczema 03/23/2019   Fibroid uterus 10/31/2018   Arthralgia 07/14/2018   Family history of arthritis 07/14/2018   Peeling skin 07/14/2018   Class 3 severe obesity due to excess calories with body mass index (BMI) of 40.0 to 44.9 in adult [facility]) 07/14/2018   Bright red rectal bleeding 07/14/2018   Anxiety 07/14/2018   Dysthymia 07/14/2018     REFERRING PROVIDER: Vanetta Mulders, MD  REFERRING DIAG: S46.011A (ICD-10-CM) - Traumatic complete tear of right rotator cuff, initial encounter Right R.TSA with biceps tenodesis, NO subscap repair  THERAPY DIAG:  Acute pain of right shoulder  Stiffness of right shoulder, not elsewhere classified  Rationale for Evaluation and Treatment Rehabilitation  ONSET DATE: DOS initial rTSA 01/05/22       DOS revision 04/01/22  SUBJECTIVE:  SUBJECTIVE STATEMENT:  Taken 1 dose of Humira and starting tramadol TID. Prednisone extended dose pack to start today. Feet and hands are very swollen. In an RA flare.   PERTINENT HISTORY: RA  PAIN:  Are you having pain? Yes: NPRS scale: severe/10 Pain location: whole body Pain description: aches, pains, body ate up with RA  Aggravating factors: movement Relieving factors: rest  PRECAUTIONS: Shoulder  WEIGHT BEARING RESTRICTIONS Yes RTSA  NWB in sling through 12/14 per op note  FALLS:  Has patient fallen in last 6  months? Yes. 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  PLOF: Independent  PATIENT GOALS decr pain, use arm (Rt hand dominant)  OBJECTIVE:   PATIENT SURVEYS:  FOTO 35  POSTURE: 12/7: wearing sling without abd pillow  UPPER EXTREMITY ROM:    ROM  Right eval Right  10/12 Rt PROM 04/08/22  Shoulder flexion 60 (passive) 98 90  Shoulder extension     Shoulder abduction  46 25  Shoulder adduction     Shoulder internal rotation     Shoulder external rotation     (Blank rows = not tested)  UPPER EXTREMITY MMT:  Not appropriate to test at this time   PALPATION:  04/08/22: Guarded, upper trap tension Plapable tightness through middle deltoid   TODAY'S TREATMENT:   Treatment                            04/28/22:  PROM to 90 tolerated PROM elbow flex/ext STM to mid deltoid, periscapular trigger point release & passive scap mobility in seated Incision mobilization Scapular retraction- iso shoulder extension & ER, isometric hold in elbow flexion and neutral ER   Treatment                            04/22/22:  PROM- tolerated flexion to 90, abd to 45 STM to upper trap, deltoid, pecs Incision mobilization   04/15/22  Spent considerable amount of time taking off Ktape/applying shea lotion, educating on skin care/skin reaction to tape   TherEx  PROM flexion to 90-110* limited by deltoid spasms/pain PROM ABD to 60-80* limited by deltoid spasms/pain     Treatment                            ERO 04/08/22:  PROM to shoulder STM to middle deltoid, upper trap Ktape post and middle deltoid, upper trap inhibition Scapular retraction Deep breathing through spasm    PATIENT EDUCATION: Education details: Anatomy of condition, POC, HEP, exercise form/rationale Person educated: Patient Education method: Explanation, Demonstration, Tactile cues, Verbal cues, and Handouts Education comprehension: verbalized  understanding, returned demonstration, verbal cues required, tactile cues required, and needs further education   HOME EXERCISE PROGRAM: [id number]  ASSESSMENT:  CLINICAL IMPRESSION:  Significant tightness in lateral deltoid and very TTP at distal 1/3 of incision.  Able to fire post deltoid head with heavy VC & TC.   OBJECTIVE IMPAIRMENTS decreased activity tolerance, decreased mobility, decreased ROM, decreased strength, increased edema, increased muscle spasms, impaired flexibility, impaired UE functional use, improper body mechanics, postural dysfunction, and pain.   ACTIVITY LIMITATIONS carrying, lifting, transfers, bed mobility, dressing, and hygiene/grooming  PARTICIPATION LIMITATIONS: meal prep, cleaning, laundry, driving, shopping, community activity, and occupation  [place] and 1-2 comorbidities: RA, obesity  are also affecting  patient's functional outcome.   REHAB POTENTIAL: Good  CLINICAL DECISION MAKING: Stable/uncomplicated  EVALUATION COMPLEXITY: Low   GOALS: Goals reviewed with patient? Yes  SHORT TERM GOALS: Target date: 04/29/2022   1.  Active ER to 30 deg in standing without shoulder elevation Baseline: unable at eval Goal status: achieved  2.  AROM Elevation to at least 90 deg with good scapular control Baseline: see chart Goal status: achieved  3.  Passive flexion to 130 Baseline: see chart Goal status: achieved  4.  Able to demo full elbow AROM without incr pain Baseline: elbow and wrist motions begun at eval Goal status: achieved    LONG TERM GOALS: goal date # weeks post op, revision Days since surgery: 27   Fire all heads of deltoid Baseline: unable at eval Goal status: INITIAL Target date:   (4wk)  2.  Active shoulder elevation to 130 Baseline: see chart Goal status: INITIAL Target date:   (6wk)  3.  Average pain <=3/10 in daily activities out of sling, aware of when to take rest breaks for pain  managemenet Baseline: in sling at eval Goal status: INITIAL Target date:   (6wk)  4.  Demo ability to reach light objects, such as empty dishes, into cabinets above shoulder height with involved UE Baseline: unable at eval Goal status: INITIAL Target date:   (10 wk)  5.  Able to progress weight lifting tolerance to 10lb pain <=3/10 Baseline: will progress as appropriate Goal status: INITIAL Target date:   (10 wk)  6.  Able to demo at least 75% strength via hand held dynamometry testing in straight plane motions compared to opp UE Baseline: unable to test at eval Goal status: INITIAL Target date:   (12 wk)  7.  Able to return to work without limitations Baseline: unable at eval Goal status: INITIAL Target date:   (12 wk)   PLAN: PT FREQUENCY: 1-2x/week  PT DURATION: 12 weeks  PLANNED INTERVENTIONS: Therapeutic exercises, Therapeutic activity, Neuromuscular re-education, Patient/Family education, Self Care, Joint mobilization, Aquatic Therapy, Dry Needling, Electrical stimulation, Spinal mobilization, Cryotherapy, Moist heat, scar mobilization, Taping, Vasopneumatic device, Manual therapy, and Re-evaluation  PLAN FOR NEXT SESSION: , progress as tolerated do not use ktape, has skin reaction/allergy    Briel Gallicchio C. Rebacca Votaw PT, DPT 04/28/22 10:17 AM

## 2022-04-28 NOTE — Progress Notes (Signed)
Post Operative Evaluation    Procedure/Date of Surgery: Right revision shoulder arthroplasty 04/01/22  Interval History:   Presents today for follow-up of the above procedure.  Overall she is slowly improving.  She does still have some soreness about the deltoid.  She is currently having a rheumatoid flareup for which her rheumatologist is starting her back on Humira.  PMH/PSH/Family History/Social History/Meds/Allergies:    Past Medical History:  Diagnosis Date   ADHD (attention deficit hyperactivity disorder)    Ankylosing spondylitis ([place])    Anxiety    Arthritis    Depression    Eczema    Family history of adverse reaction to anesthesia    dad would be crazy, talk out of his head-per pt   Headache    Rheumatoid arthritis ([place])    Past Surgical History:  Procedure Laterality Date   BLADDER SUSPENSION N/A 04/14/2021   Procedure: TRANSVAGINAL TAPE (TVT) PROCEDURE;  Surgeon: Sanjuana Kava, MD;  Location: [place];  Service: Gynecology;  Laterality: N/A;  GYNCARE TVT EXACT   CYSTOSCOPY N/A 04/14/2021   Procedure: CYSTOSCOPY;  Surgeon: Sanjuana Kava, MD;  Location: [place];  Service: Gynecology;  Laterality: N/A;   KNEE ARTHROSCOPY Left 1988   REVERSE SHOULDER ARTHROPLASTY Right 01/05/2022   Procedure: RIGHT SHOULDER REVERSE SHOULDER ARTHROPLASTY;  Surgeon: Vanetta Mulders, MD;  Location: [place];  Service: Orthopedics;  Laterality: Right;   REVERSE SHOULDER ARTHROPLASTY Right 04/01/2022   Procedure: RIGHT REVERSE SHOULDER ARTHROPLASTY REVISION;  Surgeon: Vanetta Mulders, MD;  Location: [place];  Service: Orthopedics;  Laterality: Right;   TONSILLECTOMY     as a child   TOTAL LAPAROSCOPIC HYSTERECTOMY WITH SALPINGECTOMY Bilateral 04/14/2021   Procedure: TOTAL LAPAROSCOPIC HYSTERECTOMY WITH BILATERAL SALPINGECTOMY AND BILATERAL OOPHERECTOMY;  Surgeon: Sanjuana Kava, MD;  Location: [place];  Service: Gynecology;  Laterality: Bilateral;   Social History    Socioeconomic History   Marital status: Married    Spouse name: Legrand Como   Number of children: 1   Years of education: 12   Highest education level: High school graduate  Occupational History   Not on file  Tobacco Use   Smoking status: Former    Packs/day: 1.00    Years: 25.00    Total pack years: 25.00    Types: Cigarettes    Quit date: 01/09/2016    Years since quitting: 6.3   Smokeless tobacco: Never  Vaping Use   Vaping Use: Never used  Substance and Sexual Activity   Alcohol use: Not Currently   Drug use: No   Sexual activity: Yes    Partners: Male    Birth control/protection: None  Other Topics Concern   Not on file  Social History Narrative   Not on file   Social Determinants of Health   Financial Resource Strain: Low Risk  (07/14/2018)   Overall Financial Resource Strain (CARDIA)    Difficulty of Paying Living Expenses: Not hard at all  Food Insecurity: No Food Insecurity (07/14/2018)   Hunger Vital Sign    Worried About Running Out of Food in the Last Year: Never true    [place] in the Last Year: Never true  Transportation Needs: No Transportation Needs (04/02/2022)   PRAPARE - Hydrologist (Medical): No    Lack of Transportation (Non-Medical):  No  Physical Activity: Inactive (07/14/2018)   Exercise Vital Sign    Days of Exercise per Week: 0 days    Minutes of Exercise per Session: 0 min  Stress: No Stress Concern Present (07/14/2018)   [place]    Feeling of Stress : Only a little  Social Connections: Somewhat Isolated (07/14/2018)   Social Connection and Isolation Panel [NHANES]    Frequency of Communication with Friends and Family: More than three times a week    Frequency of Social Gatherings with Friends and Family: More than three times a week    Attends Religious Services: Never    Marine scientist or Organizations: No    Attends Programme researcher, broadcasting/film/video: Never    Marital Status: Married   Family History  Problem Relation Age of Onset   Hypertension Mother    Arthritis Father    Heart disease Sister    Pulmonary Hypertension Sister    COPD Sister    Diabetes Sister    Breast cancer Maternal Grandmother    Heart attack Paternal Grandmother    Breast cancer Maternal Aunt    Breast cancer Cousin    No Known Allergies Current Outpatient Medications  Medication Sig Dispense Refill   traMADol (ULTRAM) 50 MG tablet Take 1 tablet (50 mg total) by mouth every 12 (twelve) hours as needed. 30 tablet 2   ALPRAZolam (XANAX) 1 MG tablet Take 1 mg by mouth 3 (three) times daily as needed for anxiety.     amphetamine-dextroamphetamine (ADDERALL) 20 MG tablet Take 20 mg by mouth 2 (two) times daily.     aspirin EC 325 MG tablet Take 1 tablet (325 mg total) by mouth daily. 30 tablet 0   busPIRone (BUSPAR) 7.5 MG tablet Take 7.5 mg by mouth 3 (three) times daily.     Calcium Carb-Cholecalciferol (CALCIUM 600+D3 PO) Take 1 tablet by mouth 3 (three) times daily.     cholecalciferol (VITAMIN D3) 25 MCG (1000 UNIT) tablet Take 1,000 Units by mouth daily.     clobetasol cream (TEMOVATE) 6.57 % Apply 1 application  topically daily as needed (irritation).     diclofenac (VOLTAREN) 50 MG EC tablet Take 50 mg by mouth 2 (two) times daily.     levocetirizine (XYZAL) 5 MG tablet Take 5 mg by mouth every evening.     Multiple Vitamins-Minerals (MULTIVITAMIN WITH MINERALS) tablet Take 1 tablet by mouth daily.     omeprazole (PRILOSEC) 20 MG capsule Take 1 capsule by mouth once daily 90 capsule 0   oxyCODONE (ROXICODONE) 5 MG immediate release tablet Take 1 tablet (5 mg total) by mouth every 4 (four) hours as needed for severe pain or breakthrough pain. 30 tablet 0   polyethylene glycol powder (GLYCOLAX/MIRALAX) 17 GM/SCOOP powder Take 17 g by mouth daily. 3350 g 3   rosuvastatin (CRESTOR) 40 MG tablet Take 40 mg by mouth every evening.      traZODone (DESYREL) 150 MG tablet Take 150 mg by mouth at bedtime as needed for sleep.     venlafaxine XR (EFFEXOR-XR) 150 MG 24 hr capsule Take 1 capsule (150 mg total) by mouth daily. (Patient taking differently: Take 300 mg by mouth daily with breakfast.) 60 capsule 6   No current facility-administered medications for this visit.   No results found.  Review of Systems:   A ROS was performed including pertinent positives and negatives as documented in the HPI.  Musculoskeletal Exam:     Right shoulder incision is well-appearing without erythema or drainage.  She able to flex and extend at the right elbow.  In the past position she is able to forward elevate to 90 degrees.  This is done sitting.  External rotation at the side is to 20 although there is significant guarding.  Again internal rotation deferred Imaging:    3 views right shoulder: Status post revision arthroplasty without evidence of complication  I personally reviewed and interpreted the radiographs.   Assessment:   51 year old female who is 4 weeks status post right revision shoulder arthroplasty without any evidence of recurring instability at this time.  Cultures were no growth.  At this time I do believe that it would be okay to resume Humira as she is having significant rheumatoid type pain that is making it very difficult for her to her rehab and recover.  I will plan to see her back in 4 weeks for reassessment.  Plan :    -Return to clinic in 4 weeks for reassessment      I personally saw and evaluated the patient, and participated in the management and treatment plan.  Vanetta Mulders, MD Attending Physician, Orthopedic Surgery  This document was dictated using Dragon voice recognition software. A reasonable attempt at proof reading has been made to minimize errors.

## 2022-04-29 ENCOUNTER — Ambulatory Visit (HOSPITAL_BASED_OUTPATIENT_CLINIC_OR_DEPARTMENT_OTHER): Payer: 59 | Admitting: Physical Therapy

## 2022-04-29 ENCOUNTER — Encounter (HOSPITAL_BASED_OUTPATIENT_CLINIC_OR_DEPARTMENT_OTHER): Payer: Self-pay

## 2022-04-29 ENCOUNTER — Other Ambulatory Visit (HOSPITAL_BASED_OUTPATIENT_CLINIC_OR_DEPARTMENT_OTHER): Payer: Self-pay | Admitting: Orthopaedic Surgery

## 2022-04-30 ENCOUNTER — Encounter (HOSPITAL_BASED_OUTPATIENT_CLINIC_OR_DEPARTMENT_OTHER): Payer: Self-pay | Admitting: Physical Therapy

## 2022-05-04 ENCOUNTER — Ambulatory Visit (HOSPITAL_BASED_OUTPATIENT_CLINIC_OR_DEPARTMENT_OTHER): Payer: 59 | Admitting: Physical Therapy

## 2022-05-04 LAB — FUNGAL ORGANISM REFLEX

## 2022-05-04 LAB — FUNGUS CULTURE WITH STAIN

## 2022-05-04 LAB — FUNGUS CULTURE RESULT

## 2022-05-05 DIAGNOSIS — T84028A Dislocation of other internal joint prosthesis, initial encounter: Secondary | ICD-10-CM

## 2022-05-07 ENCOUNTER — Encounter (HOSPITAL_BASED_OUTPATIENT_CLINIC_OR_DEPARTMENT_OTHER): Payer: Self-pay | Admitting: Physical Therapy

## 2022-05-07 ENCOUNTER — Ambulatory Visit (HOSPITAL_BASED_OUTPATIENT_CLINIC_OR_DEPARTMENT_OTHER): Payer: 59 | Attending: Orthopaedic Surgery | Admitting: Physical Therapy

## 2022-05-07 DIAGNOSIS — M25511 Pain in right shoulder: Secondary | ICD-10-CM | POA: Diagnosis not present

## 2022-05-07 DIAGNOSIS — R7303 Prediabetes: Secondary | ICD-10-CM | POA: Diagnosis not present

## 2022-05-07 DIAGNOSIS — M25611 Stiffness of right shoulder, not elsewhere classified: Secondary | ICD-10-CM | POA: Diagnosis not present

## 2022-05-07 DIAGNOSIS — I10 Essential (primary) hypertension: Secondary | ICD-10-CM | POA: Diagnosis not present

## 2022-05-07 DIAGNOSIS — E782 Mixed hyperlipidemia: Secondary | ICD-10-CM | POA: Diagnosis not present

## 2022-05-07 DIAGNOSIS — E6609 Other obesity due to excess calories: Secondary | ICD-10-CM | POA: Diagnosis not present

## 2022-05-07 DIAGNOSIS — R5383 Other fatigue: Secondary | ICD-10-CM | POA: Diagnosis not present

## 2022-05-07 NOTE — Therapy (Signed)
OUTPATIENT PHYSICAL THERAPY SHOULDER TREATMENT   Patient Name: Anna Keller MRN: [id number] DOB:1970/09/14, 52 y.o., female Today's Date: 05/07/2022   PT End of Session - 05/07/22 1017     Visit Number 8    Number of Visits 25    Date for PT Re-Evaluation 07/02/22    Authorization Type Aet[employer]    PT Start Time 1015    PT Stop Time 1055    PT Time Calculation (min) 40 min    Activity Tolerance Patient limited by pain    Behavior During Therapy Anxious                  Past Medical History:  Diagnosis Date   ADHD (attention deficit hyperactivity disorder)    Ankylosing spondylitis ([place])    Anxiety    Arthritis    Depression    Eczema    Family history of adverse reaction to anesthesia    dad would be crazy, talk out of his head-per pt   Headache    Rheumatoid arthritis ([place])    Past Surgical History:  Procedure Laterality Date   BLADDER SUSPENSION N/A 04/14/2021   Procedure: TRANSVAGINAL TAPE (TVT) PROCEDURE;  Surgeon: Sanjuana Kava, MD;  Location: [place];  Service: Gynecology;  Laterality: N/A;  GYNCARE TVT EXACT   CYSTOSCOPY N/A 04/14/2021   Procedure: CYSTOSCOPY;  Surgeon: Sanjuana Kava, MD;  Location: [place];  Service: Gynecology;  Laterality: N/A;   KNEE ARTHROSCOPY Left 1988   REVERSE SHOULDER ARTHROPLASTY Right 01/05/2022   Procedure: RIGHT SHOULDER REVERSE SHOULDER ARTHROPLASTY;  Surgeon: Vanetta Mulders, MD;  Location: [place];  Service: Orthopedics;  Laterality: Right;   REVERSE SHOULDER ARTHROPLASTY Right 04/01/2022   Procedure: RIGHT REVERSE SHOULDER ARTHROPLASTY REVISION;  Surgeon: Vanetta Mulders, MD;  Location: [place];  Service: Orthopedics;  Laterality: Right;   TONSILLECTOMY     as a child   TOTAL LAPAROSCOPIC HYSTERECTOMY WITH SALPINGECTOMY Bilateral 04/14/2021   Procedure: TOTAL LAPAROSCOPIC HYSTERECTOMY WITH BILATERAL SALPINGECTOMY AND BILATERAL OOPHERECTOMY;  Surgeon: Sanjuana Kava, MD;  Location: [place];  Service: Gynecology;  Laterality: Bilateral;    Patient Active Problem List   Diagnosis Date Noted   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[place]) 05/05/2022   Status post reverse arthroplasty of right shoulder 04/01/2022   Unilateral primary osteoarthritis, left knee 01/27/2022   Traumatic complete tear of right rotator cuff    Fibroids 04/14/2021   S/P laparoscopic hysterectomy 04/14/2021   Grief reaction 03/23/2019   Gastroesophageal reflux disease without esophagitis 03/23/2019   Constipation 03/23/2019   Eczema 03/23/2019   Fibroid uterus 10/31/2018   Arthralgia 07/14/2018   Family history of arthritis 07/14/2018   Peeling skin 07/14/2018   Class 3 severe obesity due to excess calories with body mass index (BMI) of 40.0 to 44.9 in adult [facility]) 07/14/2018   Bright red rectal bleeding 07/14/2018   Anxiety 07/14/2018   Dysthymia 07/14/2018     REFERRING PROVIDER: Vanetta Mulders, MD  REFERRING DIAG: S46.011A (ICD-10-CM) - Traumatic complete tear of right rotator cuff, initial encounter Right R.TSA with biceps tenodesis, NO subscap repair  THERAPY DIAG:  Acute pain of right shoulder  Stiffness of right shoulder, not elsewhere classified  Rationale for Evaluation and Treatment Rehabilitation  ONSET DATE: DOS initial rTSA 01/05/22       DOS revision 04/01/22  SUBJECTIVE:  SUBJECTIVE STATEMENT:  Less swelling in hands. Tramadol has helped quite a bit with some of the other pain. Go to PCP on Monday and plan to talk about side effects of meds. Shoulder is tremendously sore, very tight and it feels like it is going to fall off when I move it.   PERTINENT HISTORY: RA  PAIN:  Are you having pain? Yes: NPRS scale: severe/10 Pain location: whole body Pain description: aches, pains, body ate up with RA  Aggravating factors:  movement Relieving factors: rest  PRECAUTIONS: Shoulder  WEIGHT BEARING RESTRICTIONS Yes RTSA  NWB in sling through 12/14 per op note  FALLS:  Has patient fallen in last 6 months? Yes. 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  PLOF: Independent  PATIENT GOALS decr pain, use arm (Rt hand dominant)  OBJECTIVE:   PATIENT SURVEYS:  FOTO 35  POSTURE: 12/7: wearing sling without abd pillow  UPPER EXTREMITY ROM:    ROM  Right eval Right  10/12 Rt PROM 04/08/22  Shoulder flexion 60 (passive) 98 90  Shoulder extension     Shoulder abduction  46 25  Shoulder adduction     Shoulder internal rotation     Shoulder external rotation     (Blank rows = not tested)  UPPER EXTREMITY MMT:  Not appropriate to test at this time   PALPATION:  04/08/22: Guarded, upper trap tension Plapable tightness through middle deltoid   TODAY'S TREATMENT:   Treatment                            05/07/22:  AAROM flexion to 90 deg and limited by tightness per pt IASTM Rt lat and ant deltoid- unable to tolerate scar massage Hand rail for flexion & scaption finger crawls below 90 deg Seated STM to upper trap Hand hang with deep breathing    Treatment                            04/28/22:  PROM to 90 tolerated PROM elbow flex/ext STM to mid deltoid, periscapular trigger point release & passive scap mobility in seated Incision mobilization Scapular retraction- iso shoulder extension & ER, isometric hold in elbow flexion and neutral ER   Treatment                            04/22/22:  PROM- tolerated flexion to 90, abd to 45 STM to upper trap, deltoid, pecs Incision mobilization     PATIENT EDUCATION: Education details: Anatomy of condition, POC, HEP, exercise form/rationale Person educated: Patient Education method: Consulting civil engineer, Demonstration, Tactile cues, Verbal cues, and Handouts Education comprehension: verbalized  understanding, returned demonstration, verbal cues required, tactile cues required, and needs further education   HOME EXERCISE PROGRAM: [id number]  ASSESSMENT:  CLINICAL IMPRESSION:  Took pt outside to work on exercises today as she was very hot but we found that the slanted hand rail was better for AAROM than the upright pole/wall ladder as she could maintain a bit more relaxed state of the deltoid. Asked her to find a relaxed position of the shoulder frequently throughout the day for 3 deep breaths and then return to daily activities. Very guarded through deltoid limiting flexibility challenges but if she can be more relaxed more frequently, we should be allowed to progress stretching.  OBJECTIVE IMPAIRMENTS decreased activity tolerance, decreased mobility, decreased ROM, decreased strength, increased edema, increased muscle spasms, impaired flexibility, impaired UE functional use, improper body mechanics, postural dysfunction, and pain.   ACTIVITY LIMITATIONS carrying, lifting, transfers, bed mobility, dressing, and hygiene/grooming  PARTICIPATION LIMITATIONS: meal prep, cleaning, laundry, driving, shopping, community activity, and occupation  [place] and 1-2 comorbidities: RA, obesity  are also affecting patient's functional outcome.   REHAB POTENTIAL: Good  CLINICAL DECISION MAKING: Stable/uncomplicated  EVALUATION COMPLEXITY: Low   GOALS: Goals reviewed with patient? Yes  SHORT TERM GOALS: Target date: 04/29/2022   1.  Active ER to 30 deg in standing without shoulder elevation Baseline: unable at eval Goal status: achieved  2.  AROM Elevation to at least 90 deg with good scapular control Baseline: see chart Goal status: achieved  3.  Passive flexion to 130 Baseline: see chart Goal status: achieved  4.  Able to demo full elbow AROM without incr pain Baseline: elbow and wrist motions begun at eval Goal status: achieved    LONG TERM GOALS: goal  date # weeks post op, revision Days since surgery: 36   Fire all heads of deltoid Baseline: unable at eval Goal status: INITIAL Target date:   (4wk)  2.  Active shoulder elevation to 130 Baseline: see chart Goal status: INITIAL Target date:   (6wk)  3.  Average pain <=3/10 in daily activities out of sling, aware of when to take rest breaks for pain managemenet Baseline: in sling at eval Goal status: INITIAL Target date:   (6wk)  4.  Demo ability to reach light objects, such as empty dishes, into cabinets above shoulder height with involved UE Baseline: unable at eval Goal status: INITIAL Target date:   (10 wk)  5.  Able to progress weight lifting tolerance to 10lb pain <=3/10 Baseline: will progress as appropriate Goal status: INITIAL Target date:   (10 wk)  6.  Able to demo at least 75% strength via hand held dynamometry testing in straight plane motions compared to opp UE Baseline: unable to test at eval Goal status: INITIAL Target date:   (12 wk)  7.  Able to return to work without limitations Baseline: unable at eval Goal status: INITIAL Target date:   (12 wk)   PLAN: PT FREQUENCY: 1-2x/week  PT DURATION: 12 weeks  PLANNED INTERVENTIONS: Therapeutic exercises, Therapeutic activity, Neuromuscular re-education, Patient/Family education, Self Care, Joint mobilization, Aquatic Therapy, Dry Needling, Electrical stimulation, Spinal mobilization, Cryotherapy, Moist heat, scar mobilization, Taping, Vasopneumatic device, Manual therapy, and Re-evaluation  PLAN FOR NEXT SESSION: , progress as tolerated do not use ktape, has skin reaction/allergy    Randa Riss C. Gurman [place] PT, DPT 05/07/22 11:03 AM

## 2022-05-10 ENCOUNTER — Encounter (HOSPITAL_BASED_OUTPATIENT_CLINIC_OR_DEPARTMENT_OTHER): Payer: Self-pay

## 2022-05-10 ENCOUNTER — Ambulatory Visit (HOSPITAL_BASED_OUTPATIENT_CLINIC_OR_DEPARTMENT_OTHER): Payer: 59 | Admitting: Physical Therapy

## 2022-05-10 DIAGNOSIS — R5383 Other fatigue: Secondary | ICD-10-CM | POA: Diagnosis not present

## 2022-05-10 DIAGNOSIS — E782 Mixed hyperlipidemia: Secondary | ICD-10-CM | POA: Diagnosis not present

## 2022-05-10 DIAGNOSIS — R69 Illness, unspecified: Secondary | ICD-10-CM | POA: Diagnosis not present

## 2022-05-10 DIAGNOSIS — R7303 Prediabetes: Secondary | ICD-10-CM | POA: Diagnosis not present

## 2022-05-10 DIAGNOSIS — E6609 Other obesity due to excess calories: Secondary | ICD-10-CM | POA: Diagnosis not present

## 2022-05-13 ENCOUNTER — Ambulatory Visit (HOSPITAL_BASED_OUTPATIENT_CLINIC_OR_DEPARTMENT_OTHER): Payer: 59 | Admitting: Physical Therapy

## 2022-05-13 ENCOUNTER — Other Ambulatory Visit (HOSPITAL_BASED_OUTPATIENT_CLINIC_OR_DEPARTMENT_OTHER): Payer: Self-pay

## 2022-05-13 ENCOUNTER — Encounter (HOSPITAL_BASED_OUTPATIENT_CLINIC_OR_DEPARTMENT_OTHER): Payer: Self-pay | Admitting: Physical Therapy

## 2022-05-13 DIAGNOSIS — M25511 Pain in right shoulder: Secondary | ICD-10-CM

## 2022-05-13 DIAGNOSIS — F909 Attention-deficit hyperactivity disorder, unspecified type: Secondary | ICD-10-CM | POA: Diagnosis not present

## 2022-05-13 DIAGNOSIS — M25611 Stiffness of right shoulder, not elsewhere classified: Secondary | ICD-10-CM | POA: Diagnosis not present

## 2022-05-13 DIAGNOSIS — R69 Illness, unspecified: Secondary | ICD-10-CM | POA: Diagnosis not present

## 2022-05-13 DIAGNOSIS — F411 Generalized anxiety disorder: Secondary | ICD-10-CM | POA: Diagnosis not present

## 2022-05-13 NOTE — Therapy (Signed)
OUTPATIENT PHYSICAL THERAPY SHOULDER TREATMENT   Patient Name: Anna Keller MRN: [id number] DOB:May 18, 1970, 52 y.o., female Today's Date: 05/13/2022   PT End of Session - 05/13/22 1302     Visit Number 9    Number of Visits 25    Date for PT Re-Evaluation 07/02/22    Authorization Type Aetna    PT Start Time 1300    PT Stop Time 1338    PT Time Calculation (min) 38 min    Activity Tolerance Patient limited by pain    Behavior During Therapy Anxious                  Past Medical History:  Diagnosis Date   ADHD (attention deficit hyperactivity disorder)    Ankylosing spondylitis ([place])    Anxiety    Arthritis    Depression    Eczema    Family history of adverse reaction to anesthesia    dad would be crazy, talk out of his head-per pt   Headache    Rheumatoid arthritis ([place])    Past Surgical History:  Procedure Laterality Date   BLADDER SUSPENSION N/A 04/14/2021   Procedure: TRANSVAGINAL TAPE (TVT) PROCEDURE;  Surgeon: Sanjuana Kava, MD;  Location: [place];  Service: Gynecology;  Laterality: N/A;  GYNCARE TVT EXACT   CYSTOSCOPY N/A 04/14/2021   Procedure: CYSTOSCOPY;  Surgeon: Sanjuana Kava, MD;  Location: [place];  Service: Gynecology;  Laterality: N/A;   KNEE ARTHROSCOPY Left 1988   REVERSE SHOULDER ARTHROPLASTY Right 01/05/2022   Procedure: RIGHT SHOULDER REVERSE SHOULDER ARTHROPLASTY;  Surgeon: Vanetta Mulders, MD;  Location: [place];  Service: Orthopedics;  Laterality: Right;   REVERSE SHOULDER ARTHROPLASTY Right 04/01/2022   Procedure: RIGHT REVERSE SHOULDER ARTHROPLASTY REVISION;  Surgeon: Vanetta Mulders, MD;  Location: [place];  Service: Orthopedics;  Laterality: Right;   TONSILLECTOMY     as a child   TOTAL LAPAROSCOPIC HYSTERECTOMY WITH SALPINGECTOMY Bilateral 04/14/2021   Procedure: TOTAL LAPAROSCOPIC HYSTERECTOMY WITH BILATERAL SALPINGECTOMY AND BILATERAL OOPHERECTOMY;  Surgeon: Sanjuana Kava, MD;  Location: [place];  Service: Gynecology;  Laterality:  Bilateral;   Patient Active Problem List   Diagnosis Date Noted   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[place]) 05/05/2022   Status post reverse arthroplasty of right shoulder 04/01/2022   Unilateral primary osteoarthritis, left knee 01/27/2022   Traumatic complete tear of right rotator cuff    Fibroids 04/14/2021   S/P laparoscopic hysterectomy 04/14/2021   Grief reaction 03/23/2019   Gastroesophageal reflux disease without esophagitis 03/23/2019   Constipation 03/23/2019   Eczema 03/23/2019   Fibroid uterus 10/31/2018   Arthralgia 07/14/2018   Family history of arthritis 07/14/2018   Peeling skin 07/14/2018   Class 3 severe obesity due to excess calories with body mass index (BMI) of 40.0 to 44.9 in adult [facility]) 07/14/2018   Bright red rectal bleeding 07/14/2018   Anxiety 07/14/2018   Dysthymia 07/14/2018     REFERRING PROVIDER: Vanetta Mulders, MD  REFERRING DIAG: S46.011A (ICD-10-CM) - Traumatic complete tear of right rotator cuff, initial encounter Right R.TSA with biceps tenodesis, NO subscap repair  THERAPY DIAG:  Acute pain of right shoulder  Stiffness of right shoulder, not elsewhere classified  Rationale for Evaluation and Treatment Rehabilitation  ONSET DATE: DOS initial rTSA 01/05/22       DOS revision 04/01/22  SUBJECTIVE:  SUBJECTIVE STATEMENT:  The front circle area of the shoulder just doesn't let up. I cannot get my arm up to wash my hair.   PERTINENT HISTORY: RA  PAIN:  Are you having pain? Yes: NPRS scale: severe/10 Pain location: whole body Pain description: aches, pains, body ate up with RA  Aggravating factors: movement Relieving factors: rest  PRECAUTIONS: Shoulder  WEIGHT BEARING RESTRICTIONS Yes RTSA  NWB in sling through 12/14 per op  note  FALLS:  Has patient fallen in last 6 months? Yes. 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  PLOF: Independent  PATIENT GOALS decr pain, use arm (Rt hand dominant)  OBJECTIVE:   PATIENT SURVEYS:  FOTO 35  POSTURE: 12/7: wearing sling without abd pillow  UPPER EXTREMITY ROM:    ROM  Right eval Right  10/12 Rt PROM 04/08/22  Shoulder flexion 60 (passive) 98 90  Shoulder extension     Shoulder abduction  46 25  Shoulder adduction     Shoulder internal rotation     Shoulder external rotation     (Blank rows = not tested)  UPPER EXTREMITY MMT:  Not appropriate to test at this time   PALPATION:  04/08/22: Guarded, upper trap tension Plapable tightness through middle deltoid   TODAY'S TREATMENT:   Treatment                            05/13/22:  MANUAL: prone rib and thoracic mobs, Rt periscapular STM Deltoid isometrics Scar tissue massage    Treatment                            05/07/22:  AAROM flexion to 90 deg and limited by tightness per pt IASTM Rt lat and ant deltoid- unable to tolerate scar massage Hand rail for flexion & scaption finger crawls below 90 deg Seated STM to upper trap Hand hang with deep breathing    Treatment                            04/28/22:  PROM to 90 tolerated PROM elbow flex/ext STM to mid deltoid, periscapular trigger point release & passive scap mobility in seated Incision mobilization Scapular retraction- iso shoulder extension & ER, isometric hold in elbow flexion and neutral ER    PATIENT EDUCATION: Education details: Anatomy of condition, POC, HEP, exercise form/rationale Person educated: Patient Education method: Explanation, Demonstration, Tactile cues, Verbal cues, and Handouts Education comprehension: verbalized understanding, returned demonstration, verbal cues required, tactile cues required, and needs further education   HOME EXERCISE  PROGRAM: [id number]  ASSESSMENT:  CLINICAL IMPRESSION:  Hypersenstiitivy at anterior shoulder. Minimal activation available in deltoid during isometrics. Was able to lay prone and demo shoulder flexion to 90 to rest arm on support pad.    OBJECTIVE IMPAIRMENTS decreased activity tolerance, decreased mobility, decreased ROM, decreased strength, increased edema, increased muscle spasms, impaired flexibility, impaired UE functional use, improper body mechanics, postural dysfunction, and pain.   ACTIVITY LIMITATIONS carrying, lifting, transfers, bed mobility, dressing, and hygiene/grooming  PARTICIPATION LIMITATIONS: meal prep, cleaning, laundry, driving, shopping, community activity, and occupation  [place] and 1-2 comorbidities: RA, obesity  are also affecting patient's functional outcome.   REHAB POTENTIAL: Good  CLINICAL DECISION MAKING: Stable/uncomplicated  EVALUATION COMPLEXITY: Low   GOALS: Goals reviewed with patient? Yes  SHORT TERM GOALS: Target date: 04/29/2022   1.  Active ER to 30 deg in standing without shoulder elevation Baseline: unable at eval Goal status: achieved  2.  AROM Elevation to at least 90 deg with good scapular control Baseline: see chart Goal status: achieved  3.  Passive flexion to 130 Baseline: see chart Goal status: achieved  4.  Able to demo full elbow AROM without incr pain Baseline: elbow and wrist motions begun at eval Goal status: achieved    LONG TERM GOALS: goal date # weeks post op, revision Days since surgery: 71   Fire all heads of deltoid Baseline: unable at eval Goal status: INITIAL Target date:   (4wk)  2.  Active shoulder elevation to 130 Baseline: see chart Goal status: INITIAL Target date:   (6wk)  3.  Average pain <=3/10 in daily activities out of sling, aware of when to take rest breaks for pain managemenet Baseline: in sling at eval Goal status: INITIAL Target date:   (6wk)  4.  Demo  ability to reach light objects, such as empty dishes, into cabinets above shoulder height with involved UE Baseline: unable at eval Goal status: INITIAL Target date:   (10 wk)  5.  Able to progress weight lifting tolerance to 10lb pain <=3/10 Baseline: will progress as appropriate Goal status: INITIAL Target date:   (10 wk)  6.  Able to demo at least 75% strength via hand held dynamometry testing in straight plane motions compared to opp UE Baseline: unable to test at eval Goal status: INITIAL Target date:   (12 wk)  7.  Able to return to work without limitations Baseline: unable at eval Goal status: INITIAL Target date:   (12 wk)   PLAN: PT FREQUENCY: 1-2x/week  PT DURATION: 12 weeks  PLANNED INTERVENTIONS: Therapeutic exercises, Therapeutic activity, Neuromuscular re-education, Patient/Family education, Self Care, Joint mobilization, Aquatic Therapy, Dry Needling, Electrical stimulation, Spinal mobilization, Cryotherapy, Moist heat, scar mobilization, Taping, Vasopneumatic device, Manual therapy, and Re-evaluation  PLAN FOR NEXT SESSION: , progress as tolerated do not use ktape, has skin reaction/allergy    Kobi Aller C. Delisha Peaden PT, DPT 05/13/22 1:40 PM

## 2022-05-17 ENCOUNTER — Encounter: Payer: 59 | Admitting: Dietician

## 2022-05-17 ENCOUNTER — Ambulatory Visit (HOSPITAL_BASED_OUTPATIENT_CLINIC_OR_DEPARTMENT_OTHER): Payer: 59 | Admitting: Physical Therapy

## 2022-05-17 LAB — ACID FAST CULTURE WITH REFLEXED SENSITIVITIES (MYCOBACTERIA)
Acid Fast Culture: NEGATIVE
Acid Fast Culture: NEGATIVE
Acid Fast Culture: NEGATIVE

## 2022-05-19 ENCOUNTER — Encounter (HOSPITAL_BASED_OUTPATIENT_CLINIC_OR_DEPARTMENT_OTHER): Payer: Self-pay | Admitting: Physical Therapy

## 2022-05-20 ENCOUNTER — Encounter (HOSPITAL_BASED_OUTPATIENT_CLINIC_OR_DEPARTMENT_OTHER): Payer: Self-pay | Admitting: Orthopaedic Surgery

## 2022-05-20 ENCOUNTER — Encounter (HOSPITAL_BASED_OUTPATIENT_CLINIC_OR_DEPARTMENT_OTHER): Payer: Self-pay | Admitting: Physical Therapy

## 2022-05-24 ENCOUNTER — Encounter (HOSPITAL_BASED_OUTPATIENT_CLINIC_OR_DEPARTMENT_OTHER): Payer: 59 | Admitting: Physical Therapy

## 2022-05-26 ENCOUNTER — Ambulatory Visit (INDEPENDENT_AMBULATORY_CARE_PROVIDER_SITE_OTHER): Payer: 59 | Admitting: Orthopaedic Surgery

## 2022-05-26 DIAGNOSIS — Z9889 Other specified postprocedural states: Secondary | ICD-10-CM

## 2022-05-26 NOTE — Progress Notes (Signed)
Post Operative Evaluation    Procedure/Date of Surgery: Right revision shoulder arthroplasty 04/01/22  Interval History:   Presents today for follow-up of the above procedure.  She is having significant sensitivity around the incision.  This is painful with any direct touch.  Denies any fevers or chills.  She did have a recent fall without any significant injury to the shoulder.  PMH/PSH/Family History/Social History/Meds/Allergies:    Past Medical History:  Diagnosis Date   ADHD (attention deficit hyperactivity disorder)    Ankylosing spondylitis ([place])    Anxiety    Arthritis    Depression    Eczema    Family history of adverse reaction to anesthesia    dad would be crazy, talk out of his head-per pt   Headache    Rheumatoid arthritis ([place])    Past Surgical History:  Procedure Laterality Date   BLADDER SUSPENSION N/A 04/14/2021   Procedure: TRANSVAGINAL TAPE (TVT) PROCEDURE;  Surgeon: Sanjuana Kava, MD;  Location: [place];  Service: Gynecology;  Laterality: N/A;  GYNCARE TVT EXACT   CYSTOSCOPY N/A 04/14/2021   Procedure: CYSTOSCOPY;  Surgeon: Sanjuana Kava, MD;  Location: [place];  Service: Gynecology;  Laterality: N/A;   KNEE ARTHROSCOPY Left 1988   REVERSE SHOULDER ARTHROPLASTY Right 01/05/2022   Procedure: RIGHT SHOULDER REVERSE SHOULDER ARTHROPLASTY;  Surgeon: Vanetta Mulders, MD;  Location: [place];  Service: Orthopedics;  Laterality: Right;   REVERSE SHOULDER ARTHROPLASTY Right 04/01/2022   Procedure: RIGHT REVERSE SHOULDER ARTHROPLASTY REVISION;  Surgeon: Vanetta Mulders, MD;  Location: [place];  Service: Orthopedics;  Laterality: Right;   TONSILLECTOMY     as a child   TOTAL LAPAROSCOPIC HYSTERECTOMY WITH SALPINGECTOMY Bilateral 04/14/2021   Procedure: TOTAL LAPAROSCOPIC HYSTERECTOMY WITH BILATERAL SALPINGECTOMY AND BILATERAL OOPHERECTOMY;  Surgeon: Sanjuana Kava, MD;  Location: [place];  Service: Gynecology;  Laterality: Bilateral;   Social History    Socioeconomic History   Marital status: Married    Spouse name: Legrand Como   Number of children: 1   Years of education: 12   Highest education level: High school graduate  Occupational History   Not on file  Tobacco Use   Smoking status: Former    Packs/day: 1.00    Years: 25.00    Total pack years: 25.00    Types: Cigarettes    Quit date: 01/09/2016    Years since quitting: 6.3   Smokeless tobacco: Never  Vaping Use   Vaping Use: Never used  Substance and Sexual Activity   Alcohol use: Not Currently   Drug use: No   Sexual activity: Yes    Partners: Male    Birth control/protection: None  Other Topics Concern   Not on file  Social History Narrative   Not on file   Social Determinants of Health   Financial Resource Strain: Low Risk  (07/14/2018)   Overall Financial Resource Strain (CARDIA)    Difficulty of Paying Living Expenses: Not hard at all  Food Insecurity: No Food Insecurity (07/14/2018)   Hunger Vital Sign    Worried About Running Out of Food in the Last Year: Never true    [place] in the Last Year: Never true  Transportation Needs: No Transportation Needs (04/02/2022)   PRAPARE - Transportation    Lack of Transportation (Medical): No    Lack  of Transportation (Non-Medical): No  Physical Activity: Inactive (07/14/2018)   Exercise Vital Sign    Days of Exercise per Week: 0 days    Minutes of Exercise per Session: 0 min  Stress: No Stress Concern Present (07/14/2018)   [place]    Feeling of Stress : Only a little  Social Connections: Somewhat Isolated (07/14/2018)   Social Connection and Isolation Panel [NHANES]    Frequency of Communication with Friends and Family: More than three times a week    Frequency of Social Gatherings with Friends and Family: More than three times a week    Attends Religious Services: Never    Marine scientist or Organizations: No    Attends Programme researcher, broadcasting/film/video: Never    Marital Status: Married   Family History  Problem Relation Age of Onset   Hypertension Mother    Arthritis Father    Heart disease Sister    Pulmonary Hypertension Sister    COPD Sister    Diabetes Sister    Breast cancer Maternal Grandmother    Heart attack Paternal Grandmother    Breast cancer Maternal Aunt    Breast cancer Cousin    No Known Allergies Current Outpatient Medications  Medication Sig Dispense Refill   traMADol (ULTRAM) 50 MG tablet Take 1 tablet (50 mg total) by mouth every 12 (twelve) hours as needed. 30 tablet 2   ALPRAZolam (XANAX) 1 MG tablet Take 1 mg by mouth 3 (three) times daily as needed for anxiety.     amphetamine-dextroamphetamine (ADDERALL) 20 MG tablet Take 20 mg by mouth 2 (two) times daily.     aspirin EC 325 MG tablet Take 1 tablet (325 mg total) by mouth daily. 30 tablet 0   busPIRone (BUSPAR) 7.5 MG tablet Take 7.5 mg by mouth 3 (three) times daily.     Calcium Carb-Cholecalciferol (CALCIUM 600+D3 PO) Take 1 tablet by mouth 3 (three) times daily.     cholecalciferol (VITAMIN D3) 25 MCG (1000 UNIT) tablet Take 1,000 Units by mouth daily.     clobetasol cream (TEMOVATE) 0.10 % Apply 1 application  topically daily as needed (irritation).     diclofenac (VOLTAREN) 50 MG EC tablet Take 50 mg by mouth 2 (two) times daily.     levocetirizine (XYZAL) 5 MG tablet Take 5 mg by mouth every evening.     Multiple Vitamins-Minerals (MULTIVITAMIN WITH MINERALS) tablet Take 1 tablet by mouth daily.     omeprazole (PRILOSEC) 20 MG capsule Take 1 capsule by mouth once daily 90 capsule 0   oxyCODONE (ROXICODONE) 5 MG immediate release tablet Take 1 tablet (5 mg total) by mouth every 4 (four) hours as needed for severe pain or breakthrough pain. 30 tablet 0   polyethylene glycol powder (GLYCOLAX/MIRALAX) 17 GM/SCOOP powder Take 17 g by mouth daily. 3350 g 3   rosuvastatin (CRESTOR) 40 MG tablet Take 40 mg by mouth every evening.      traZODone (DESYREL) 150 MG tablet Take 150 mg by mouth at bedtime as needed for sleep.     venlafaxine XR (EFFEXOR-XR) 150 MG 24 hr capsule Take 1 capsule (150 mg total) by mouth daily. (Patient taking differently: Take 300 mg by mouth daily with breakfast.) 60 capsule 6   No current facility-administered medications for this visit.   No results found.  Review of Systems:   A ROS was performed including pertinent positives and negatives as documented in the  HPI.   Musculoskeletal Exam:     Right shoulder incision is well-healed.  She able to flex and extend at the right elbow.  In the supine position she is able to forward elevate to 90 degrees.  External rotation at the side is to 20 although there is significant guarding.  Again internal rotation deferred Imaging:    3 views right shoulder: Status post revision arthroplasty without evidence of complication  I personally reviewed and interpreted the radiographs.   Assessment:   52 year old female who is 6 weeks status post right shoulder reverse shoulder arthroplasty revision for recurrent instability.  At this time her rehab has been quite limited as she does have significant scar sensitivity.  I did describe that this is making it quite difficult to rehab although overall she is able to forward elevate to shoulder level so I do not believe there is any type of structural injury or neurological injury.  There is no evidence of infection at this time.  I do believe that she will continue to slowly recover.  I would like to see her back in 2 months for reassessment Plan :    -Return to clinic in 8 weeks for reassessment      I personally saw and evaluated the patient, and participated in the management and treatment plan.  Vanetta Mulders, MD Attending Physician, Orthopedic Surgery  This document was dictated using Dragon voice recognition software. A reasonable attempt at proof reading has been made to minimize errors.

## 2022-05-28 ENCOUNTER — Ambulatory Visit (HOSPITAL_BASED_OUTPATIENT_CLINIC_OR_DEPARTMENT_OTHER): Payer: 59 | Admitting: Physical Therapy

## 2022-05-28 ENCOUNTER — Encounter (HOSPITAL_BASED_OUTPATIENT_CLINIC_OR_DEPARTMENT_OTHER): Payer: Self-pay

## 2022-05-31 ENCOUNTER — Other Ambulatory Visit (HOSPITAL_BASED_OUTPATIENT_CLINIC_OR_DEPARTMENT_OTHER): Payer: Self-pay | Admitting: Orthopaedic Surgery

## 2022-05-31 ENCOUNTER — Encounter (HOSPITAL_BASED_OUTPATIENT_CLINIC_OR_DEPARTMENT_OTHER): Payer: Self-pay | Admitting: Orthopaedic Surgery

## 2022-05-31 ENCOUNTER — Ambulatory Visit (HOSPITAL_BASED_OUTPATIENT_CLINIC_OR_DEPARTMENT_OTHER): Payer: 59 | Admitting: Physical Therapy

## 2022-05-31 MED ORDER — TRAMADOL HCL 50 MG PO TABS: 50.0000 mg | ORAL_TABLET | Freq: Two times a day (BID) | ORAL | 2 refills | Status: DC | PRN

## 2022-06-01 ENCOUNTER — Ambulatory Visit: Payer: 59 | Admitting: Psychologist

## 2022-06-07 ENCOUNTER — Encounter (HOSPITAL_BASED_OUTPATIENT_CLINIC_OR_DEPARTMENT_OTHER): Payer: Self-pay | Admitting: Physical Therapy

## 2022-06-07 ENCOUNTER — Ambulatory Visit (HOSPITAL_BASED_OUTPATIENT_CLINIC_OR_DEPARTMENT_OTHER): Payer: 59 | Attending: Orthopaedic Surgery | Admitting: Physical Therapy

## 2022-06-07 DIAGNOSIS — S46011A Strain of muscle(s) and tendon(s) of the rotator cuff of right shoulder, initial encounter: Secondary | ICD-10-CM | POA: Insufficient documentation

## 2022-06-07 DIAGNOSIS — M25611 Stiffness of right shoulder, not elsewhere classified: Secondary | ICD-10-CM | POA: Insufficient documentation

## 2022-06-07 DIAGNOSIS — M25511 Pain in right shoulder: Secondary | ICD-10-CM | POA: Diagnosis not present

## 2022-06-07 NOTE — Therapy (Signed)
OUTPATIENT PHYSICAL THERAPY SHOULDER TREATMENT   Patient Name: Anna Keller MRN: [id number] DOB:July 22, 1970, 52 y.o., female Today's Date: 06/07/2022   PT End of Session - 06/07/22 1433     Visit Number 10    Number of Visits 25    Date for PT Re-Evaluation 07/02/22    Authorization Type [employer]    PT Start Time 1432    PT Stop Time 1513    PT Time Calculation (min) 41 min    Activity Tolerance Patient limited by pain    Behavior During Therapy [facility] for tasks assessed/performed;Anxious                   Past Medical History:  Diagnosis Date   ADHD (attention deficit hyperactivity disorder)    Ankylosing spondylitis ([place])    Anxiety    Arthritis    Depression    Eczema    Family history of adverse reaction to anesthesia    dad would be crazy, talk out of his head-per pt   Headache    Rheumatoid arthritis ([place])    Past Surgical History:  Procedure Laterality Date   BLADDER SUSPENSION N/A 04/14/2021   Procedure: TRANSVAGINAL TAPE (TVT) PROCEDURE;  Surgeon: Sanjuana Kava, MD;  Location: [place];  Service: Gynecology;  Laterality: N/A;  GYNCARE TVT EXACT   CYSTOSCOPY N/A 04/14/2021   Procedure: CYSTOSCOPY;  Surgeon: Sanjuana Kava, MD;  Location: [place];  Service: Gynecology;  Laterality: N/A;   KNEE ARTHROSCOPY Left 1988   REVERSE SHOULDER ARTHROPLASTY Right 01/05/2022   Procedure: RIGHT SHOULDER REVERSE SHOULDER ARTHROPLASTY;  Surgeon: Vanetta Mulders, MD;  Location: [place];  Service: Orthopedics;  Laterality: Right;   REVERSE SHOULDER ARTHROPLASTY Right 04/01/2022   Procedure: RIGHT REVERSE SHOULDER ARTHROPLASTY REVISION;  Surgeon: Vanetta Mulders, MD;  Location: [place];  Service: Orthopedics;  Laterality: Right;   TONSILLECTOMY     as a child   TOTAL LAPAROSCOPIC HYSTERECTOMY WITH SALPINGECTOMY Bilateral 04/14/2021   Procedure: TOTAL LAPAROSCOPIC HYSTERECTOMY WITH BILATERAL SALPINGECTOMY AND BILATERAL OOPHERECTOMY;  Surgeon: Sanjuana Kava, MD;  Location: [place];  Service:  Gynecology;  Laterality: Bilateral;   Patient Active Problem List   Diagnosis Date Noted   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[place]) 05/05/2022   Status post reverse arthroplasty of right shoulder 04/01/2022   Unilateral primary osteoarthritis, left knee 01/27/2022   Traumatic complete tear of right rotator cuff    Fibroids 04/14/2021   S/P laparoscopic hysterectomy 04/14/2021   Grief reaction 03/23/2019   Gastroesophageal reflux disease without esophagitis 03/23/2019   Constipation 03/23/2019   Eczema 03/23/2019   Fibroid uterus 10/31/2018   Arthralgia 07/14/2018   Family history of arthritis 07/14/2018   Peeling skin 07/14/2018   Class 3 severe obesity due to excess calories with body mass index (BMI) of 40.0 to 44.9 in adult [facility]) 07/14/2018   Bright red rectal bleeding 07/14/2018   Anxiety 07/14/2018   Dysthymia 07/14/2018     REFERRING PROVIDER: Vanetta Mulders, MD  REFERRING DIAG: S46.011A (ICD-10-CM) - Traumatic complete tear of right rotator cuff, initial encounter Right R.TSA with biceps tenodesis, NO subscap repair  THERAPY DIAG:  Acute pain of right shoulder  Stiffness of right shoulder, not elsewhere classified  Rationale for Evaluation and Treatment Rehabilitation  ONSET DATE: DOS initial rTSA 01/05/22       DOS revision 04/01/22  SUBJECTIVE:  SUBJECTIVE STATEMENT:  Has been out for weeks due to COVID and lasting respiratory issues. Back on prednisone.   PERTINENT HISTORY: RA  PAIN:  Are you having pain? Yes: NPRS scale: 7-8/10 Pain location: whole body Pain description: aches, pains, body ate up with RA  Aggravating factors: constant Relieving factors: rest  PRECAUTIONS: Shoulder  WEIGHT BEARING RESTRICTIONSnone on 2/5  FALLS:  Has patient  fallen in last 6 months? Yes. 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  PLOF: Independent  PATIENT GOALS decr pain, use arm (Rt hand dominant)  OBJECTIVE:   PATIENT SURVEYS:  FOTO 35 - FOTO no longer found in system on 2/5  UEFI 06/07/22: 15/90 (anxiety with circling)   POSTURE: 2/5 WFL  UPPER EXTREMITY ROM:    ROM  Right eval Right  10/12 Rt PROM 04/08/22 Rt A//PROM 2/5  Shoulder flexion 60 (passive) 98 90 62//90  Shoulder extension      Shoulder abduction  46 25 40//90   Shoulder adduction      Shoulder internal rotation      Shoulder external rotation      (Blank rows = not tested)  UPPER EXTREMITY MMT:  Not appropriate to test at this time   PALPATION:  04/08/22: Guarded, upper trap tension Plapable tightness through middle deltoid   TODAY'S TREATMENT:   Treatment                            2/5:  MANUAL: PROM- flx/abd with distraction, scar tissue massage W reach to head, forward flexion with thumbs up   Treatment                            05/13/22:  MANUAL: prone rib and thoracic mobs, Rt periscapular STM Deltoid isometrics Scar tissue massage    Treatment                            05/07/22:  AAROM flexion to 90 deg and limited by tightness per pt IASTM Rt lat and ant deltoid- unable to tolerate scar massage Hand rail for flexion & scaption finger crawls below 90 deg Seated STM to upper trap Hand hang with deep breathing     PATIENT EDUCATION: Education details: Geophysicist/field seismologist of condition, POC, HEP, exercise form/rationale Person educated: Patient Education method: Explanation, Demonstration, Tactile cues, Verbal cues, and Handouts Education comprehension: verbalized understanding, returned demonstration, verbal cues required, tactile cues required, and needs further education   HOME EXERCISE PROGRAM: [id number]  ASSESSMENT:  CLINICAL IMPRESSION:  Improving passive and active ROM  but limited by incision pain. Very tight at proximal half of incision and will continue to work on mobility. Asked that she focus on ROM 90 deg and below actively and against gravity with short lever arm. Pt cannot tolerate touching her own incision.    OBJECTIVE IMPAIRMENTS decreased activity tolerance, decreased mobility, decreased ROM, decreased strength, increased edema, increased muscle spasms, impaired flexibility, impaired UE functional use, improper body mechanics, postural dysfunction, and pain.   ACTIVITY LIMITATIONS carrying, lifting, transfers, bed mobility, dressing, and hygiene/grooming  PARTICIPATION LIMITATIONS: meal prep, cleaning, laundry, driving, shopping, community activity, and occupation  [place] and 1-2 comorbidities: RA, obesity  are also affecting patient's functional outcome.   REHAB POTENTIAL: Good  CLINICAL DECISION MAKING: Stable/uncomplicated  EVALUATION COMPLEXITY: Low  GOALS: Goals reviewed with patient? Yes  SHORT TERM GOALS: Target date: 04/29/2022   1.  Active ER to 30 deg in standing without shoulder elevation Baseline: unable at eval Goal status: achieved  2.  AROM Elevation to at least 90 deg with good scapular control Baseline: see chart Goal status: achieved  3.  Passive flexion to 130 Baseline: see chart Goal status: achieved  4.  Able to demo full elbow AROM without incr pain Baseline: elbow and wrist motions begun at eval Goal status: achieved    LONG TERM GOALS: goal date # weeks post op, revision   Days since surgery: 67  Addressed 06/07/22  Fire all heads of deltoid Baseline: unable at eval Goal status: achieved Target date:   (4wk)  2.  Active shoulder elevation to 130 Baseline: see chart Goal status: ongoing Target date:   (6wk)  3.  Average pain <=3/10 in daily activities out of sling, aware of when to take rest breaks for pain managemenet Baseline: always at 7-8/10 Goal status:  ongoing Target date:   (6wk)  4.  Demo ability to reach light objects, such as empty dishes, into cabinets above shoulder height with involved UE Baseline: significant difficulty Goal status:ongoing Target date:   (10 wk)  5.  Able to progress weight lifting tolerance to 10lb pain <=3/10 Baseline: unable Goal status:ongoing Target date:   (10 wk)  6.  Able to demo at least 75% strength via hand held dynamometry testing in straight plane motions compared to opp UE Baseline: not appropriate to test today Goal status: ongoing Target date:   (12 wk)  7.  Able to return to work without limitations Baseline: had to leave her original job Goal status: ongoing Target date:   (12 wk)   PLAN: PT FREQUENCY: 1-2x/week  PT DURATION: 12 weeks  PLANNED INTERVENTIONS: Therapeutic exercises, Therapeutic activity, Neuromuscular re-education, Patient/Family education, Self Care, Joint mobilization, Aquatic Therapy, Dry Needling, Electrical stimulation, Spinal mobilization, Cryotherapy, Moist heat, scar mobilization, Taping, Vasopneumatic device, Manual therapy, and Re-evaluation  PLAN FOR NEXT SESSION: , progress as tolerated do not use ktape, has skin reaction/allergy    Lavaun [place] C. Challis Crill PT, DPT 06/07/22 3:16 PM

## 2022-06-10 ENCOUNTER — Encounter: Payer: Self-pay | Admitting: Nurse Practitioner

## 2022-06-10 ENCOUNTER — Ambulatory Visit (HOSPITAL_BASED_OUTPATIENT_CLINIC_OR_DEPARTMENT_OTHER): Payer: 59 | Admitting: Physical Therapy

## 2022-06-10 ENCOUNTER — Encounter (HOSPITAL_BASED_OUTPATIENT_CLINIC_OR_DEPARTMENT_OTHER): Payer: Self-pay | Admitting: Physical Therapy

## 2022-06-10 ENCOUNTER — Ambulatory Visit: Payer: 59 | Admitting: Nurse Practitioner

## 2022-06-10 VITALS — BP 130/90 | HR 101 | Ht 66.0 in | Wt 297.8 lb

## 2022-06-10 DIAGNOSIS — M25511 Pain in right shoulder: Secondary | ICD-10-CM

## 2022-06-10 DIAGNOSIS — F419 Anxiety disorder, unspecified: Secondary | ICD-10-CM | POA: Diagnosis not present

## 2022-06-10 DIAGNOSIS — F3289 Other specified depressive episodes: Secondary | ICD-10-CM

## 2022-06-10 DIAGNOSIS — M1990 Unspecified osteoarthritis, unspecified site: Secondary | ICD-10-CM | POA: Diagnosis not present

## 2022-06-10 DIAGNOSIS — R69 Illness, unspecified: Secondary | ICD-10-CM | POA: Diagnosis not present

## 2022-06-10 DIAGNOSIS — E669 Obesity, unspecified: Secondary | ICD-10-CM | POA: Diagnosis not present

## 2022-06-10 DIAGNOSIS — S46011A Strain of muscle(s) and tendon(s) of the rotator cuff of right shoulder, initial encounter: Secondary | ICD-10-CM | POA: Diagnosis not present

## 2022-06-10 DIAGNOSIS — M25611 Stiffness of right shoulder, not elsewhere classified: Secondary | ICD-10-CM

## 2022-06-10 MED ORDER — ZEPBOUND 2.5 MG/0.5ML ~~LOC~~ SOAJ: 2.5000 mg | SUBCUTANEOUS | 2 refills | Status: DC

## 2022-06-10 NOTE — Patient Instructions (Addendum)
1) Wean off of buspirone 2) Contact provider for fluoxetine after wean 3) Cont Venlafaxine as ordered 4) Zepbound, wegovy not covered 5) Follow up appt in 3 weeks to discuss fluoxetine

## 2022-06-10 NOTE — Therapy (Signed)
OUTPATIENT PHYSICAL THERAPY SHOULDER TREATMENT   Patient Name: Anna Keller MRN: [id number] DOB:11-07-1970, 52 y.o., female Today's Date: 06/10/2022   PT End of Session - 06/10/22 1525     Visit Number 11    Number of Visits 25    Date for PT Re-Evaluation 07/02/22    Authorization Type [employer]    PT Start Time 1525    PT Stop Time 1553    PT Time Calculation (min) 28 min    Activity Tolerance Patient limited by pain    Behavior During Therapy [facility] for tasks assessed/performed;Anxious;Agitated                   Past Medical History:  Diagnosis Date   ADHD (attention deficit hyperactivity disorder)    Ankylosing spondylitis (HCC)    Anxiety    Arthritis    Depression    Eczema    Family history of adverse reaction to anesthesia    dad would be crazy, talk out of his head-per pt   Headache    Rheumatoid arthritis ([place])    Past Surgical History:  Procedure Laterality Date   BLADDER SUSPENSION N/A 04/14/2021   Procedure: TRANSVAGINAL TAPE (TVT) PROCEDURE;  Surgeon: Sanjuana Kava, MD;  Location: [place];  Service: Gynecology;  Laterality: N/A;  GYNCARE TVT EXACT   CYSTOSCOPY N/A 04/14/2021   Procedure: CYSTOSCOPY;  Surgeon: Sanjuana Kava, MD;  Location: [place];  Service: Gynecology;  Laterality: N/A;   KNEE ARTHROSCOPY Left 1988   REVERSE SHOULDER ARTHROPLASTY Right 01/05/2022   Procedure: RIGHT SHOULDER REVERSE SHOULDER ARTHROPLASTY;  Surgeon: Vanetta Mulders, MD;  Location: [place];  Service: Orthopedics;  Laterality: Right;   REVERSE SHOULDER ARTHROPLASTY Right 04/01/2022   Procedure: RIGHT REVERSE SHOULDER ARTHROPLASTY REVISION;  Surgeon: Vanetta Mulders, MD;  Location: [place];  Service: Orthopedics;  Laterality: Right;   TONSILLECTOMY     as a child   TOTAL LAPAROSCOPIC HYSTERECTOMY WITH SALPINGECTOMY Bilateral 04/14/2021   Procedure: TOTAL LAPAROSCOPIC HYSTERECTOMY WITH BILATERAL SALPINGECTOMY AND BILATERAL OOPHERECTOMY;  Surgeon: Sanjuana Kava, MD;  Location: [place];   Service: Gynecology;  Laterality: Bilateral;   Patient Active Problem List   Diagnosis Date Noted   Joint inflammation 06/10/2022   Obesity (BMI 35.0-39.9 without comorbidity) 06/10/2022   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[place]) 05/05/2022   Status post reverse arthroplasty of right shoulder 04/01/2022   Unilateral primary osteoarthritis, left knee 01/27/2022   Traumatic complete tear of right rotator cuff    Fibroids 04/14/2021   S/P laparoscopic hysterectomy 04/14/2021   Grief reaction 03/23/2019   Gastroesophageal reflux disease without esophagitis 03/23/2019   Constipation 03/23/2019   Eczema 03/23/2019   Fibroid uterus 10/31/2018   Family history of arthritis 07/14/2018   Peeling skin 07/14/2018   Class 3 severe obesity due to excess calories with body mass index (BMI) of 40.0 to 44.9 in adult [facility]) 07/14/2018   Bright red rectal bleeding 07/14/2018   Anxiety 07/14/2018   Depression 07/14/2018     REFERRING PROVIDER: Vanetta Mulders, MD  REFERRING DIAG: S46.011A (ICD-10-CM) - Traumatic complete tear of right rotator cuff, initial encounter Right R.TSA with biceps tenodesis, NO subscap repair  THERAPY DIAG:  Acute pain of right shoulder  Stiffness of right shoulder, not elsewhere classified  Rationale for Evaluation and Treatment Rehabilitation  ONSET DATE: DOS initial rTSA 01/05/22       DOS revision 04/01/22  SUBJECTIVE:  SUBJECTIVE STATEMENT:  [place] again because my left knee gave out. Fell on all 4s with hands up to ottoman. Just started 18d pack of prednisone again.   PERTINENT HISTORY: RA  PAIN:  Are you having pain? Yes: NPRS scale: 10/10 Pain location: right shoulder Pain description: all messed up Aggravating factors: constant Relieving factors:  rest  PRECAUTIONS: Shoulder  WEIGHT BEARING RESTRICTIONSnone on 2/5  FALLS:  Has patient fallen in last 6 months? Yes. 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  PLOF: Independent  PATIENT GOALS decr pain, use arm (Rt hand dominant)  OBJECTIVE:   PATIENT SURVEYS:  FOTO 35 - FOTO no longer found in system on 2/5  UEFI 06/07/22: 15/90 (anxiety with circling)   POSTURE: 2/5 WFL  UPPER EXTREMITY ROM:    ROM  Right eval Right  10/12 Rt PROM 04/08/22 Rt A//PROM 2/5  Shoulder flexion 60 (passive) 98 90 62//90  Shoulder extension      Shoulder abduction  46 25 40//90   Shoulder adduction      Shoulder internal rotation      Shoulder external rotation      (Blank rows = not tested)  UPPER EXTREMITY MMT:  Not appropriate to test at this time   PALPATION:  04/08/22: Guarded, upper trap tension Plapable tightness through middle deltoid   TODAY'S TREATMENT:   Treatment                            06/10/22:  IASTM Rt deltoid, incision; seated ant to post joint mobs Pulleys flexion Scapular retraction assisted by PT ER AROM with gentle resistance applied by PT Seated long axis activation toward abd   Treatment                            2/5:  MANUAL: PROM- flx/abd with distraction, scar tissue massage W reach to head, forward flexion with thumbs up   Treatment                            05/13/22:  MANUAL: prone rib and thoracic mobs, Rt periscapular STM Deltoid isometrics Scar tissue massage    PATIENT EDUCATION: Education details: Anatomy of condition, POC, HEP, exercise form/rationale Person educated: Patient Education method: Explanation, Demonstration, Tactile cues, Verbal cues, and Handouts Education comprehension: verbalized understanding, returned demonstration, verbal cues required, tactile cues required, and needs further education   HOME EXERCISE  PROGRAM: [email or web address]  ASSESSMENT:  CLINICAL IMPRESSION: Focused on manual therapy and reduction in pain levels following a fall at home which resulted in a quick and stretched forward flexion position.  Is able to demonstrate some activation of the deltoid without abnormal shifting of humeral head.  Is still unable to have a knee replacement in order to reduce giving out and falls due to BMI and plans to begin at the pain clinic but has yet to make the appointment.  She did feel like the pulleys were helpful in her shoulder range of motion and was encouraged to obtain a set for home.   OBJECTIVE IMPAIRMENTS decreased activity tolerance, decreased mobility, decreased ROM, decreased strength, increased edema, increased muscle spasms, impaired flexibility, impaired UE functional use, improper body mechanics, postural dysfunction, and pain.   ACTIVITY LIMITATIONS carrying, lifting, transfers, bed mobility, dressing, and hygiene/grooming  PARTICIPATION LIMITATIONS: meal prep,  cleaning, laundry, driving, shopping, community activity, and occupation  PERSONAL FACTORS Fitness and 1-2 comorbidities: RA, obesity  are also affecting patient's functional outcome.   REHAB POTENTIAL: Good  CLINICAL DECISION MAKING: Stable/uncomplicated  EVALUATION COMPLEXITY: Low   GOALS: Goals reviewed with patient? Yes  SHORT TERM GOALS: Target date: 04/29/2022   1.  Active ER to 30 deg in standing without shoulder elevation Baseline: unable at eval Goal status: achieved  2.  AROM Elevation to at least 90 deg with good scapular control Baseline: see chart Goal status: achieved  3.  Passive flexion to 130 Baseline: see chart Goal status: achieved  4.  Able to demo full elbow AROM without incr pain Baseline: elbow and wrist motions begun at eval Goal status: achieved    LONG TERM GOALS: goal date # weeks post op, revision   Days since surgery: 85  Addressed 06/07/22  Fire all heads of  deltoid Baseline: unable at eval Goal status: achieved Target date:   (4wk)  2.  Active shoulder elevation to 130 Baseline: see chart Goal status: ongoing Target date:   (6wk)  3.  Average pain <=3/10 in daily activities out of sling, aware of when to take rest breaks for pain managemenet Baseline: always at 7-8/10 Goal status: ongoing Target date:   (6wk)  4.  Demo ability to reach light objects, such as empty dishes, into cabinets above shoulder height with involved UE Baseline: significant difficulty Goal status:ongoing Target date:   (10 wk)  5.  Able to progress weight lifting tolerance to 10lb pain <=3/10 Baseline: unable Goal status:ongoing Target date:   (10 wk)  6.  Able to demo at least 75% strength via hand held dynamometry testing in straight plane motions compared to opp UE Baseline: not appropriate to test today Goal status: ongoing Target date:   (12 wk)  7.  Able to return to work without limitations Baseline: had to leave her original job Goal status: ongoing Target date:   (12 wk)   PLAN: PT FREQUENCY: 1-2x/week  PT DURATION: 12 weeks  PLANNED INTERVENTIONS: Therapeutic exercises, Therapeutic activity, Neuromuscular re-education, Patient/Family education, Self Care, Joint mobilization, Aquatic Therapy, Dry Needling, Electrical stimulation, Spinal mobilization, Cryotherapy, Moist heat, scar mobilization, Taping, Vasopneumatic device, Manual therapy, and Re-evaluation  PLAN FOR NEXT SESSION: , progress as tolerated do not use ktape, has skin reaction/allergy    Verlinda Slotnick C. Nikesh Teschner PT, DPT 06/10/22 7:13 PM

## 2022-06-10 NOTE — Progress Notes (Addendum)
Established Patient Office Visit  Subjective   Patient ID: Anna Keller, female    DOB: June 06, 1970  Age: 52 y.o. MRN: [id number]  Chief Complaint  Patient presents with   Follow-up    Discuss meds    Discussing med refills, psychiatry meds and weight loss.     Past Medical History:  Diagnosis Date   ADHD (attention deficit hyperactivity disorder)    Ankylosing spondylitis (HCC)    Anxiety    Arthritis    Depression    Eczema    Family history of adverse reaction to anesthesia    dad would be crazy, talk out of his head-per pt   Headache    Rheumatoid arthritis ([place])     Social History   Socioeconomic History   Marital status: Married    Spouse name: Legrand Como   Number of children: 1   Years of education: 12   Highest education level: High school graduate  Occupational History   Not on file  Tobacco Use   Smoking status: Former    Packs/day: 1.00    Years: 25.00    Total pack years: 25.00    Types: Cigarettes    Quit date: 01/09/2016    Years since quitting: 6.4   Smokeless tobacco: Never  Vaping Use   Vaping Use: Never used  Substance and Sexual Activity   Alcohol use: Not Currently   Drug use: No   Sexual activity: Yes    Partners: Male    Birth control/protection: None  Other Topics Concern   Not on file  Social History Narrative   Not on file   Social Determinants of Health   Financial Resource Strain: Low Risk  (07/14/2018)   Overall Financial Resource Strain (CARDIA)    Difficulty of Paying Living Expenses: Not hard at all  Food Insecurity: No Food Insecurity (07/14/2018)   Hunger Vital Sign    Worried About Running Out of Food in the Last Year: Never true    [place] in the Last Year: Never true  Transportation Needs: No Transportation Needs (04/02/2022)   PRAPARE - Hydrologist (Medical): No    Lack of Transportation (Non-Medical): No  Physical Activity: Inactive (07/14/2018)   Exercise Vital Sign     Days of Exercise per Week: 0 days    Minutes of Exercise per Session: 0 min  Stress: No Stress Concern Present (07/14/2018)   [place]    Feeling of Stress : Only a little  Social Connections: Somewhat Isolated (07/14/2018)   Social Connection and Isolation Panel [NHANES]    Frequency of Communication with Friends and Family: More than three times a week    Frequency of Social Gatherings with Friends and Family: More than three times a week    Attends Religious Services: Never    Marine scientist or Organizations: No    Attends Archivist Meetings: Never    Marital Status: Married  Human resources officer Violence: Not At Risk (04/02/2022)   Humiliation, Afraid, Rape, and Kick questionnaire    Fear of Current or Ex-Partner: No    Emotionally Abused: No    Physically Abused: No    Sexually Abused: No    Family History  Problem Relation Age of Onset   Hypertension Mother    Arthritis Father    Heart disease Sister    Pulmonary Hypertension Sister    COPD Sister  Diabetes Sister    Breast cancer Maternal Grandmother    Heart attack Paternal Grandmother    Breast cancer Maternal Aunt    Breast cancer Cousin     No Known Allergies  Review of Systems  Constitutional: Negative.   HENT: Negative.    Eyes: Negative.   Respiratory: Negative.    Cardiovascular: Negative.   Gastrointestinal: Negative.   Genitourinary: Negative.   Musculoskeletal:  Positive for myalgias.  Skin: Negative.   Neurological: Negative.   Endo/Heme/Allergies: Negative.   Psychiatric/Behavioral:  Positive for depression. The patient has insomnia.        Objective:     BP (!) 130/90 (BP Location: Left Arm, Patient Position: Sitting, Cuff Size: Normal)   Pulse (!) 101   Ht '5\' 6"'$  (1.676 m)   Wt 297 lb 12.8 oz (135.1 kg)   LMP 03/24/2021   SpO2 96%   BMI 48.07 kg/m   Vitals:   06/10/22 1345  BP: (!) 130/90   Pulse: (!) 101  Height: '5\' 6"'$  (1.676 m)  Weight: 297 lb 12.8 oz (135.1 kg)  SpO2: 96%  BMI (Calculated): 48.09    Physical Exam Constitutional:      Appearance: Normal appearance.  HENT:     Head: Normocephalic.     Nose: Nose normal.     Mouth/Throat:     Mouth: Mucous membranes are moist.  Eyes:     Pupils: Pupils are equal, round, and reactive to light.  Cardiovascular:     Rate and Rhythm: Normal rate and regular rhythm.  Pulmonary:     Breath sounds: Normal breath sounds.  Abdominal:     General: Bowel sounds are normal.     Palpations: Abdomen is soft.  Musculoskeletal:        General: Swelling and tenderness present.     Cervical back: Neck supple.  Skin:    General: Skin is warm and dry.  Neurological:     Mental Status: She is alert and oriented to person, place, and time.  Psychiatric:        Mood and Affect: Mood normal.        Behavior: Behavior normal.      No results found for any visits on 06/10/22.  Recent Results (from the past 2160 hour(s))  CBC per protocol     Status: None   Collection Time: 04/01/22  3:04 PM  Result Value Ref Range   WBC 6.6 4.0 - 10.5 K/uL   RBC 4.36 3.87 - 5.11 MIL/uL   Hemoglobin 13.0 12.0 - 15.0 g/dL   HCT 40.3 36.0 - 46.0 %   MCV 92.4 80.0 - 100.0 fL   MCH 29.8 26.0 - 34.0 pg   MCHC 32.3 30.0 - 36.0 g/dL   RDW 13.5 11.5 - 15.5 %   Platelets 276 150 - 400 K/uL   nRBC 0.0 0.0 - 0.2 %    Comment: Performed at [facility], [place] [address], [place], [place] [address]  Fungus Culture With Stain     Status: None   Collection Time: 04/01/22  5:17 PM   Specimen: Soft Tissue, Other  Result Value Ref Range   Fungus Stain Final report    Fungus (Mycology) Culture Final report     Comment: (NOTE) Performed At: [facility] [place], [place] [id number] Rush Farmer MD UX:[id number]    Fungal Source TISSUE     Comment: RT HUMERUS Performed at [facility], [place] [address],  [place], [place] [address]   Aerobic/Anaerobic Culture w Gram Stain (surgical/deep wound)     Status: None   Collection Time: 04/01/22  5:17 PM   Specimen: Soft Tissue, Other  Result Value Ref Range   Specimen Description TISSUE    Special Requests RT HUMERUS PT ON ANCEF    Gram Stain NO WBC SEEN NO ORGANISMS SEEN     Culture      No growth aerobically or anaerobically. Performed at [facility], [place] [address] [place], [place] [address]    Report Status 04/06/2022 FINAL   Acid Fast Culture with reflexed sensitivities     Status: None   Collection Time: 04/01/22  5:17 PM   Specimen: Soft Tissue, Other  Result Value Ref Range   Acid Fast Culture Negative     Comment: (NOTE) No acid fast bacilli isolated after 6 weeks. Performed At: [facility] [place], [place] [id number] Rush Farmer MD [id number]:[id number]    Source of Sample TISSUE     Comment: RT HUMERUS Performed at [facility], [place] [address] [place], [place] [address]   Acid Fast Smear (AFB)     Status: None   Collection Time: 04/01/22  5:17 PM   Specimen: Soft Tissue, Other  Result Value Ref Range   AFB Specimen Processing Comment     Comment: Tissue Grinding and Digestion/Decontamination   Acid Fast Smear Negative     Comment: (NOTE) Performed At: [facility] [address] [place], [place] [id number] Rush Farmer MD CZ:[id number]    Source (AFB) TISSUE     Comment: RT HUMERUS Performed at [place] [address] [place], [place] [address]   Fungus Culture Result     Status: None   Collection Time: 04/01/22  5:17 PM  Result Value Ref Range   Result 1 Comment     Comment: (NOTE) KOH/Calcofluor preparation:  no fungus observed. Performed At: [facility], [place], [place] [id number] Rush Farmer MD [id number]   Fungal organism reflex     Status: None   Collection Time: 04/01/22  5:17 PM  Result Value Ref Range   Fungal result  1 Comment     Comment: (NOTE) No yeast or mold isolated after 4 weeks. Performed At: [facility] [address] [place], [place] [id number] Rush Farmer MD PX:[id number]   Fungus Culture With Stain     Status: None   Collection Time: 04/01/22  5:25 PM   Specimen: Soft Tissue, Other  Result Value Ref Range   Fungus Stain Final report    Fungus (Mycology) Culture Final report     Comment: (NOTE) Performed At: [facility] [address] [place], [place] [id number] Rush Farmer MD FG:[id number]    Fungal Source TISSUE     Comment: RT SHOULDER CAPSULE Performed at [facility], [place] [address] [place], [place] [address]   Fungus Culture With Stain     Status: None   Collection Time: 04/01/22  5:25 PM   Specimen: Soft Tissue, Other  Result Value Ref Range   Fungus Stain Final report    Fungus (Mycology) Culture Final report     Comment: (NOTE) Performed At: [facility] [address] [place], [place] [id number] Rush Farmer MD [id number]    Fungal Source TISSUE     Comment: RT GLENOID Performed at [facility], [place] [address], [place], [place] [address]   Aerobic/Anaerobic Culture w Gram Stain (  surgical/deep wound)     Status: None   Collection Time: 04/01/22  5:25 PM   Specimen: Soft Tissue, Other  Result Value Ref Range   Specimen Description TISSUE RIGHT SHOULDER    Special Requests FROM CAPSULE PT ON ANCEF    Gram Stain NO WBC SEEN NO ORGANISMS SEEN     Culture      No growth aerobically or anaerobically. Performed at [facility], [place] [address] [place], [place] [address]    Report Status 04/06/2022 FINAL   Aerobic/Anaerobic Culture w Gram Stain (surgical/deep wound)     Status: None   Collection Time: 04/01/22  5:25 PM   Specimen: Soft Tissue, Other  Result Value Ref Range   Specimen Description TISSUE    Special Requests RT GLENOID PT ON ANCEF    Gram Stain      WBC PRESENT, PREDOMINANTLY MONONUCLEAR NO  ORGANISMS SEEN    Culture      No growth aerobically or anaerobically. Performed at [facility], [place] [address], [place], [place] [address]    Report Status 04/06/2022 FINAL   Acid Fast Culture with reflexed sensitivities     Status: None   Collection Time: 04/01/22  5:25 PM   Specimen: Soft Tissue, Other  Result Value Ref Range   Acid Fast Culture Negative     Comment: (NOTE) No acid fast bacilli isolated after 6 weeks. Performed At: [facility] [place], [place] [id number] Rush Farmer MD VO:[id number]    Source of Sample TISSUE     Comment: RT SHOULDER CASPSULE Performed at [facility], [place] [address], [place], [place] [address]   Acid Fast Culture with reflexed sensitivities     Status: None   Collection Time: 04/01/22  5:25 PM   Specimen: Soft Tissue, Other  Result Value Ref Range   Acid Fast Culture Negative     Comment: (NOTE) No acid fast bacilli isolated after 6 weeks. Performed At: [facility] [place], [place] [id number] Rush Farmer MD IR:[id number]    Source of Sample TISSUE     Comment: RT GLENOID Performed at [facility], [place] [address] [place], [place] [address]   Acid Fast Smear (AFB)     Status: None   Collection Time: 04/01/22  5:25 PM   Specimen: Soft Tissue, Other  Result Value Ref Range   AFB Specimen Processing Comment     Comment: Tissue Grinding and Digestion/Decontamination   Acid Fast Smear Negative     Comment: (NOTE) Performed At: [facility] [place], [place] [id number] Rush Farmer MD DU:[id number]    Source (AFB) TISSUE     Comment: RT SHOULDER CAPSULE Performed at [facility], [place] [address] [place], [place] [address]   Acid Fast Smear (AFB)     Status: None   Collection Time: 04/01/22  5:25 PM   Specimen: Soft Tissue, Other  Result Value Ref Range   AFB Specimen Processing Comment     Comment: Tissue Grinding and  Digestion/Decontamination   Acid Fast Smear Negative     Comment: (NOTE) Performed At: [facility] [address] [place], [place] [id number] Rush Farmer MD VP:[id number]    Source (AFB) TISSUE     Comment: RT GLENOID Performed at [facility], [place] [address], [place], [place] [address]   Fungus Culture Result     Status: None   Collection Time: 04/01/22  5:25  PM  Result Value Ref Range   Result 1 Comment     Comment: (NOTE) KOH/Calcofluor preparation:  no fungus observed. Performed At: [facility] [place], [place] [id number] Rush Farmer MD ME:[id number]   Fungus Culture Result     Status: None   Collection Time: 04/01/22  5:25 PM  Result Value Ref Range   Result 1 Comment     Comment: (NOTE) KOH/Calcofluor preparation:  no fungus observed. Performed At: [facility] [place], [place] [id number] Rush Farmer MD RX:[id number]   Fungal organism reflex     Status: None   Collection Time: 04/01/22  5:25 PM  Result Value Ref Range   Fungal result 1 Comment     Comment: (NOTE) No yeast or mold isolated after 4 weeks. Performed At: [facility] [place], [place] [id number] Rush Farmer MD [id number]   Fungal organism reflex     Status: None   Collection Time: 04/01/22  5:25 PM  Result Value Ref Range   Fungal result 1 Comment     Comment: (NOTE) No yeast or mold isolated after 4 weeks. Performed At: [facility] [place], [place] [id number] Rush Farmer MD MA:[id number]   Basic metabolic panel     Status: Abnormal   Collection Time: 04/02/22  3:35 AM  Result Value Ref Range   Sodium 139 135 - 145 mmol/L   Potassium 4.4 3.5 - 5.1 mmol/L   Chloride 105 98 - 111 mmol/L   CO2 23 22 - 32 mmol/L   Glucose, Bld 163 (H) 70 - 99 mg/dL    Comment: Glucose reference range applies only to samples taken after fasting for at least 8 hours.   BUN 16 6 - 20  mg/dL   Creatinine, Ser 0.86 0.44 - 1.00 mg/dL   Calcium 9.3 8.9 - 10.3 mg/dL   GFR, Estimated >60 >60 mL/min    Comment: (NOTE) Calculated using the CKD-EPI Creatinine Equation (2021)    Anion gap 11 5 - 15    Comment: Performed at [place] [address] [place], [place] [address]      Assessment & Plan:   Problem List Items Addressed This Visit       Musculoskeletal and Integument   Joint inflammation - Primary     Other   Anxiety   Depression   Obesity (BMI 35.0-39.9 without comorbidity)   Relevant Medications   tirzepatide (ZEPBOUND) 2.5 MG/0.5ML Pen   Follow up appt in 3 weeks   Total time spent: 20 minutes  Evern Bio, NP

## 2022-06-14 ENCOUNTER — Ambulatory Visit (HOSPITAL_BASED_OUTPATIENT_CLINIC_OR_DEPARTMENT_OTHER): Payer: 59 | Admitting: Physical Therapy

## 2022-06-14 ENCOUNTER — Telehealth (HOSPITAL_BASED_OUTPATIENT_CLINIC_OR_DEPARTMENT_OTHER): Payer: Self-pay | Admitting: Physical Therapy

## 2022-06-14 NOTE — Telephone Encounter (Signed)
LVM requesting call back to decrease schedule to 1/week.   Debbera Wolken C. Dejanique Ruehl PT, DPT 06/14/22 3:50 PM

## 2022-06-15 ENCOUNTER — Encounter (HOSPITAL_BASED_OUTPATIENT_CLINIC_OR_DEPARTMENT_OTHER): Payer: Self-pay | Admitting: Physical Therapy

## 2022-06-17 ENCOUNTER — Encounter (HOSPITAL_BASED_OUTPATIENT_CLINIC_OR_DEPARTMENT_OTHER): Payer: 59 | Admitting: Physical Therapy

## 2022-06-21 ENCOUNTER — Encounter (HOSPITAL_BASED_OUTPATIENT_CLINIC_OR_DEPARTMENT_OTHER): Payer: 59 | Admitting: Physical Therapy

## 2022-06-22 ENCOUNTER — Ambulatory Visit: Payer: 59 | Admitting: Nurse Practitioner

## 2022-06-24 ENCOUNTER — Encounter (HOSPITAL_BASED_OUTPATIENT_CLINIC_OR_DEPARTMENT_OTHER): Payer: Self-pay | Admitting: Physical Therapy

## 2022-06-24 ENCOUNTER — Ambulatory Visit (HOSPITAL_BASED_OUTPATIENT_CLINIC_OR_DEPARTMENT_OTHER): Payer: 59 | Admitting: Physical Therapy

## 2022-06-24 DIAGNOSIS — S46011A Strain of muscle(s) and tendon(s) of the rotator cuff of right shoulder, initial encounter: Secondary | ICD-10-CM

## 2022-06-24 DIAGNOSIS — M25611 Stiffness of right shoulder, not elsewhere classified: Secondary | ICD-10-CM

## 2022-06-24 DIAGNOSIS — M25511 Pain in right shoulder: Secondary | ICD-10-CM

## 2022-06-24 NOTE — Therapy (Signed)
OUTPATIENT PHYSICAL THERAPY SHOULDER TREATMENT   Patient Name: Anna Keller MRN: [id number] DOB:[birth year], 52 y.o., female Today's Date: 06/24/2022   PT End of Session - 06/24/22 1436     Visit Number 12    Number of Visits 25    Date for PT Re-Evaluation 07/02/22    Authorization Type [employer]    PT Start Time 1435    PT Stop Time 1518    PT Time Calculation (min) 43 min    Activity Tolerance Patient limited by pain    Behavior During Therapy [facility] for tasks assessed/performed;Anxious;Agitated                    Past Medical History:  Diagnosis Date   ADHD (attention deficit hyperactivity disorder)    Ankylosing spondylitis (HCC)    Anxiety    Arthritis    Depression    Eczema    Family history of adverse reaction to anesthesia    dad would be crazy, talk out of his head-per pt   Headache    Rheumatoid arthritis ([place])    Past Surgical History:  Procedure Laterality Date   BLADDER SUSPENSION N/A 04/14/2021   Procedure: TRANSVAGINAL TAPE (TVT) PROCEDURE;  Surgeon: Sanjuana Kava, MD;  Location: [place];  Service: Gynecology;  Laterality: N/A;  GYNCARE TVT EXACT   CYSTOSCOPY N/A 04/14/2021   Procedure: CYSTOSCOPY;  Surgeon: Sanjuana Kava, MD;  Location: [place];  Service: Gynecology;  Laterality: N/A;   KNEE ARTHROSCOPY Left 1988   REVERSE SHOULDER ARTHROPLASTY Right 01/05/2022   Procedure: RIGHT SHOULDER REVERSE SHOULDER ARTHROPLASTY;  Surgeon: Vanetta Mulders, MD;  Location: [place];  Service: Orthopedics;  Laterality: Right;   REVERSE SHOULDER ARTHROPLASTY Right 04/01/2022   Procedure: RIGHT REVERSE SHOULDER ARTHROPLASTY REVISION;  Surgeon: Vanetta Mulders, MD;  Location: [place];  Service: Orthopedics;  Laterality: Right;   TONSILLECTOMY     as a child   TOTAL LAPAROSCOPIC HYSTERECTOMY WITH SALPINGECTOMY Bilateral 04/14/2021   Procedure: TOTAL LAPAROSCOPIC HYSTERECTOMY WITH BILATERAL SALPINGECTOMY AND BILATERAL OOPHERECTOMY;  Surgeon: Sanjuana Kava, MD;  Location: [place];  Service: Gynecology;  Laterality: Bilateral;   Patient Active Problem List   Diagnosis Date Noted   Joint inflammation 06/10/2022   Obesity (BMI 35.0-39.9 without comorbidity) 06/10/2022   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[place]) 05/05/2022   Status post reverse arthroplasty of right shoulder 04/01/2022   Unilateral primary osteoarthritis, left knee 01/27/2022   Traumatic complete tear of right rotator cuff    Fibroids 04/14/2021   S/P laparoscopic hysterectomy 04/14/2021   Grief reaction 03/23/2019   Gastroesophageal reflux disease without esophagitis 03/23/2019   Constipation 03/23/2019   Eczema 03/23/2019   Fibroid uterus 10/31/2018   Family history of arthritis 07/14/2018   Peeling skin 07/14/2018   Class 3 severe obesity due to excess calories with body mass index (BMI) of 40.0 to 44.9 in adult [facility]) 07/14/2018   Bright red rectal bleeding 07/14/2018   Anxiety 07/14/2018   Depression 07/14/2018     REFERRING PROVIDER: Vanetta Mulders, MD  REFERRING DIAG: S46.011A (ICD-10-CM) - Traumatic complete tear of right rotator cuff, initial encounter Right R.TSA with biceps tenodesis, NO subscap repair  THERAPY DIAG:  Acute pain of right shoulder  Stiffness of right shoulder, not elsewhere classified  Traumatic complete tear of right rotator cuff, initial encounter  Rationale for Evaluation and Treatment Rehabilitation  ONSET DATE: DOS initial rTSA 01/05/22       DOS revision 04/01/22  SUBJECTIVE:  SUBJECTIVE STATEMENT:  Deltoid is just so tight. I fell again so my knee is killing me, it just gives. I have fallen about 3 times now. Just finished prednisone dose pack.   PERTINENT HISTORY: RA  PAIN:  Are you having pain? Yes: NPRS scale:  /10 Pain location: right  shoulder Pain description: not a lot of pain, just sore, tight muscle pain Aggravating factors: constant Relieving factors: rest  PRECAUTIONS: Shoulder  WEIGHT BEARING RESTRICTIONSnone on 2/5  FALLS:  Has patient fallen in last 6 months? Yes. 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  PLOF: Independent  PATIENT GOALS decr pain, use arm (Rt hand dominant)  OBJECTIVE:   PATIENT SURVEYS:  FOTO 35 - FOTO no longer found in system on 2/5  UEFI 06/07/22: 15/90 (anxiety with circling)   POSTURE: 2/5 WFL  UPPER EXTREMITY ROM:    ROM  Right eval Right  10/12 Rt PROM 04/08/22 Rt A//PROM 2/5 Rt AROM  Shoulder flexion 60 (passive) 98 90 62//90 60  Shoulder extension       Shoulder abduction  46 25 40//90    Shoulder adduction       Shoulder internal rotation       Shoulder external rotation       (Blank rows = not tested)  UPPER EXTREMITY MMT:  Not appropriate to test at this time   PALPATION:  04/08/22: Guarded, upper trap tension Plapable tightness through middle deltoid   TODAY'S TREATMENT:   Treatment                            06/24/22:  Duanne Limerick for Lt patellar support IASTM Rt deltoid & biceps Pulleys flexoin & scaption Pulley to just above 90 deg with 3s activation hold Estim IFC with ice, attended 15 min Discussed aquatic exercise & aquatic PT   Treatment                            06/10/22:  IASTM Rt deltoid, incision; seated ant to post joint mobs Pulleys flexion Scapular retraction assisted by PT ER AROM with gentle resistance applied by PT Seated long axis activation toward abd   Treatment                            2/5:  MANUAL: PROM- flx/abd with distraction, scar tissue massage W reach to head, forward flexion with thumbs up    PATIENT EDUCATION: Education details: Anatomy of condition, POC, HEP, exercise form/rationale Person educated: Patient Education method: Explanation,  Demonstration, Tactile cues, Verbal cues, and Handouts Education comprehension: verbalized understanding, returned demonstration, verbal cues required, tactile cues required, and needs further education   HOME EXERCISE PROGRAM: [id number]  ASSESSMENT:  CLINICAL IMPRESSION: Pt reported significant tightness in deltoid. Tenderness that was reported as pain when palpating AC joint. Passively the shoulder is improving to greater than 90 deg when pt is in control using pulleys, was able to add activation in elevated position on pulleys. Will do an aquatics exercise next week in order to establish home program addressing shoulder strength as well as lower body strength. She has fallen 3 times recently and is at high risk of injuring the shoulder if not addressed.    OBJECTIVE IMPAIRMENTS decreased activity tolerance, decreased mobility, decreased ROM, decreased strength, increased edema, increased muscle spasms, impaired  flexibility, impaired UE functional use, improper body mechanics, postural dysfunction, and pain.   ACTIVITY LIMITATIONS carrying, lifting, transfers, bed mobility, dressing, and hygiene/grooming  PARTICIPATION LIMITATIONS: meal prep, cleaning, laundry, driving, shopping, community activity, and occupation  [place] and 1-2 comorbidities: RA, obesity  are also affecting patient's functional outcome.   REHAB POTENTIAL: Good  CLINICAL DECISION MAKING: Stable/uncomplicated  EVALUATION COMPLEXITY: Low   GOALS: Goals reviewed with patient? Yes  SHORT TERM GOALS: Target date: 04/29/2022   1.  Active ER to 30 deg in standing without shoulder elevation Baseline: unable at eval Goal status: achieved  2.  AROM Elevation to at least 90 deg with good scapular control Baseline: see chart Goal status: achieved  3.  Passive flexion to 130 Baseline: see chart Goal status: achieved  4.  Able to demo full elbow AROM without incr pain Baseline: elbow and wrist  motions begun at eval Goal status: achieved    LONG TERM GOALS: goal date # weeks post op, revision   Days since surgery: 9  Addressed 06/07/22  Fire all heads of deltoid Baseline: unable at eval Goal status: achieved Target date:   (4wk)  2.  Active shoulder elevation to 130 Baseline: see chart Goal status: ongoing Target date:   (6wk)  3.  Average pain <=3/10 in daily activities out of sling, aware of when to take rest breaks for pain managemenet Baseline: always at 7-8/10 Goal status: ongoing Target date:   (6wk)  4.  Demo ability to reach light objects, such as empty dishes, into cabinets above shoulder height with involved UE Baseline: significant difficulty Goal status:ongoing Target date:   (10 wk)  5.  Able to progress weight lifting tolerance to 10lb pain <=3/10 Baseline: unable Goal status:ongoing Target date:   (10 wk)  6.  Able to demo at least 75% strength via hand held dynamometry testing in straight plane motions compared to opp UE Baseline: not appropriate to test today Goal status: ongoing Target date:   (12 wk)  7.  Able to return to work without limitations Baseline: had to leave her original job Goal status: ongoing Target date:   (12 wk)   PLAN: PT FREQUENCY: 1-2x/week  PT DURATION: 12 weeks  PLANNED INTERVENTIONS: Therapeutic exercises, Therapeutic activity, Neuromuscular re-education, Patient/Family education, Self Care, Joint mobilization, Aquatic Therapy, Dry Needling, Electrical stimulation, Spinal mobilization, Cryotherapy, Moist heat, scar mobilization, Taping, Vasopneumatic device, Manual therapy, and Re-evaluation  PLAN FOR NEXT SESSION: , progress as tolerated do not use ktape, has skin reaction/allergy    Anna Keller C. Kiley Torrence PT, DPT 06/24/22 5:29 PM

## 2022-06-28 ENCOUNTER — Encounter (HOSPITAL_BASED_OUTPATIENT_CLINIC_OR_DEPARTMENT_OTHER): Payer: 59 | Admitting: Physical Therapy

## 2022-06-30 ENCOUNTER — Encounter (HOSPITAL_BASED_OUTPATIENT_CLINIC_OR_DEPARTMENT_OTHER): Payer: Self-pay

## 2022-06-30 ENCOUNTER — Ambulatory Visit (HOSPITAL_BASED_OUTPATIENT_CLINIC_OR_DEPARTMENT_OTHER): Payer: 59 | Admitting: Physical Therapy

## 2022-07-01 ENCOUNTER — Encounter (HOSPITAL_BASED_OUTPATIENT_CLINIC_OR_DEPARTMENT_OTHER): Payer: 59 | Admitting: Physical Therapy

## 2022-07-01 ENCOUNTER — Ambulatory Visit: Payer: 59 | Admitting: Nurse Practitioner

## 2022-07-01 VITALS — BP 132/80 | HR 94 | Ht 66.0 in | Wt 299.0 lb

## 2022-07-01 DIAGNOSIS — K219 Gastro-esophageal reflux disease without esophagitis: Secondary | ICD-10-CM | POA: Diagnosis not present

## 2022-07-01 DIAGNOSIS — F331 Major depressive disorder, recurrent, moderate: Secondary | ICD-10-CM

## 2022-07-01 DIAGNOSIS — M2550 Pain in unspecified joint: Secondary | ICD-10-CM | POA: Diagnosis not present

## 2022-07-01 DIAGNOSIS — F419 Anxiety disorder, unspecified: Secondary | ICD-10-CM | POA: Diagnosis not present

## 2022-07-01 MED ORDER — FLUOXETINE HCL 20 MG PO CAPS: 20.0000 mg | ORAL_CAPSULE | Freq: Every day | ORAL | 2 refills | Status: DC

## 2022-07-01 MED ORDER — ALPRAZOLAM 1 MG PO TABS: 1.0000 mg | ORAL_TABLET | Freq: Three times a day (TID) | ORAL | 1 refills | Status: DC | PRN

## 2022-07-01 NOTE — Patient Instructions (Signed)
1) Patient weaned off of buspirone 2) Starting fluoxetine 20 mg daily 3) Refilled alprazolam 4) Follow up appt in 4 months, fasting labs prior

## 2022-07-01 NOTE — Progress Notes (Signed)
Established Patient Office Visit  Subjective:  Patient ID: Anna Keller, female    DOB: November 15, 1970  Age: 52 y.o. MRN: [id number]  Chief Complaint  Patient presents with   Follow-up    3 weeks folllow up medications    4 week follow up post wean buspirone, starting fluoxetine.  Patient ready to start new regimen.     Past Medical History:  Diagnosis Date   ADHD (attention deficit hyperactivity disorder)    Ankylosing spondylitis (HCC)    Anxiety    Arthritis    Depression    Eczema    Family history of adverse reaction to anesthesia    dad would be crazy, talk out of his head-per pt   Headache    Rheumatoid arthritis ([place])     Social History   Socioeconomic History   Marital status: Married    Spouse name: Legrand Como   Number of children: 1   Years of education: 12   Highest education level: High school graduate  Occupational History   Not on file  Tobacco Use   Smoking status: Former    Packs/day: 1.00    Years: 25.00    Total pack years: 25.00    Types: Cigarettes    Quit date: 01/09/2016    Years since quitting: 6.4   Smokeless tobacco: Never  Vaping Use   Vaping Use: Never used  Substance and Sexual Activity   Alcohol use: Not Currently   Drug use: No   Sexual activity: Yes    Partners: Male    Birth control/protection: None  Other Topics Concern   Not on file  Social History Narrative   Not on file   Social Determinants of Health   Financial Resource Strain: Low Risk  (07/14/2018)   Overall Financial Resource Strain (CARDIA)    Difficulty of Paying Living Expenses: Not hard at all  Food Insecurity: No Food Insecurity (07/14/2018)   Hunger Vital Sign    Worried About Running Out of Food in the Last Year: Never true    [place] in the Last Year: Never true  Transportation Needs: No Transportation Needs (04/02/2022)   PRAPARE - Hydrologist (Medical): No    Lack of Transportation (Non-Medical): No   Physical Activity: Inactive (07/14/2018)   Exercise Vital Sign    Days of Exercise per Week: 0 days    Minutes of Exercise per Session: 0 min  Stress: No Stress Concern Present (07/14/2018)   [place]    Feeling of Stress : Only a little  Social Connections: Somewhat Isolated (07/14/2018)   Social Connection and Isolation Panel [NHANES]    Frequency of Communication with Friends and Family: More than three times a week    Frequency of Social Gatherings with Friends and Family: More than three times a week    Attends Religious Services: Never    Marine scientist or Organizations: No    Attends Archivist Meetings: Never    Marital Status: Married  Human resources officer Violence: Not At Risk (04/02/2022)   Humiliation, Afraid, Rape, and Kick questionnaire    Fear of Current or Ex-Partner: No    Emotionally Abused: No    Physically Abused: No    Sexually Abused: No    Family History  Problem Relation Age of Onset   Hypertension Mother    Arthritis Father    Heart disease Sister  Pulmonary Hypertension Sister    COPD Sister    Diabetes Sister    Breast cancer Maternal Grandmother    Heart attack Paternal Grandmother    Breast cancer Maternal Aunt    Breast cancer Cousin     No Known Allergies  Review of Systems  Constitutional: Negative.   HENT: Negative.    Eyes: Negative.   Respiratory: Negative.    Cardiovascular: Negative.   Gastrointestinal: Negative.   Genitourinary: Negative.   Musculoskeletal: Negative.   Skin: Negative.   Neurological: Negative.   Endo/Heme/Allergies: Negative.   Psychiatric/Behavioral:  Positive for depression. The patient is nervous/anxious and has insomnia.        Objective:   BP 132/80   Pulse 94   Ht '5\' 6"'$  (1.676 m)   Wt 299 lb (135.6 kg)   LMP 03/24/2021   SpO2 98%   BMI 48.26 kg/m   Vitals:   07/01/22 1328  BP: 132/80  Pulse: 94  Height: 5'  6" (1.676 m)  Weight: 299 lb (135.6 kg)  SpO2: 98%  BMI (Calculated): 48.28    Physical Exam Vitals reviewed.  Constitutional:      Appearance: Normal appearance.  HENT:     Head: Normocephalic.     Nose: Nose normal.     Mouth/Throat:     Mouth: Mucous membranes are moist.  Eyes:     Pupils: Pupils are equal, round, and reactive to light.  Cardiovascular:     Rate and Rhythm: Normal rate and regular rhythm.  Pulmonary:     Effort: Pulmonary effort is normal.     Breath sounds: Normal breath sounds.  Abdominal:     General: Bowel sounds are normal.     Palpations: Abdomen is soft.  Musculoskeletal:     Cervical back: Normal range of motion and neck supple.  Skin:    General: Skin is warm and dry.  Neurological:     Mental Status: She is alert and oriented to person, place, and time.  Psychiatric:        Mood and Affect: Mood normal.        Behavior: Behavior normal.      No results found for any visits on 07/01/22.  No results found for this or any previous visit (from the past 2160 hour(s)).    Assessment & Plan:   Problem List Items Addressed This Visit       Digestive   Gastroesophageal reflux disease without esophagitis     Other   Pain in joint, multiple sites   Anxiety - Primary   Relevant Medications   FLUoxetine (PROZAC) 20 MG capsule   Depression   Relevant Medications   FLUoxetine (PROZAC) 20 MG capsule    No follow-ups on file.   Total time spent: 35 minutes  Evern Bio, NP  07/01/2022

## 2022-07-05 ENCOUNTER — Encounter (HOSPITAL_BASED_OUTPATIENT_CLINIC_OR_DEPARTMENT_OTHER): Payer: 59

## 2022-07-06 ENCOUNTER — Other Ambulatory Visit: Payer: Self-pay | Admitting: Student in an Organized Health Care Education/Training Program

## 2022-07-06 ENCOUNTER — Encounter: Payer: Self-pay | Admitting: Student in an Organized Health Care Education/Training Program

## 2022-07-06 ENCOUNTER — Ambulatory Visit: Payer: 59 | Admitting: Student in an Organized Health Care Education/Training Program

## 2022-07-06 ENCOUNTER — Ambulatory Visit
Admission: RE | Admit: 2022-07-06 | Discharge: 2022-07-06 | Disposition: A | Discharge status: Home / Self Care | Payer: 59 | Source: Ambulatory Visit | Attending: Student in an Organized Health Care Education/Training Program | Admitting: Student in an Organized Health Care Education/Training Program

## 2022-07-06 ENCOUNTER — Encounter (HOSPITAL_BASED_OUTPATIENT_CLINIC_OR_DEPARTMENT_OTHER): Payer: Self-pay | Admitting: Physical Therapy

## 2022-07-06 VITALS — BP 117/93 | HR 97 | Temp 97.0°F | Resp 16 | Ht 66.0 in | Wt 295.0 lb

## 2022-07-06 DIAGNOSIS — M1990 Unspecified osteoarthritis, unspecified site: Secondary | ICD-10-CM

## 2022-07-06 DIAGNOSIS — M533 Sacrococcygeal disorders, not elsewhere classified: Secondary | ICD-10-CM | POA: Insufficient documentation

## 2022-07-06 DIAGNOSIS — Z96611 Presence of right artificial shoulder joint: Secondary | ICD-10-CM

## 2022-07-06 DIAGNOSIS — M545 Low back pain, unspecified: Secondary | ICD-10-CM | POA: Insufficient documentation

## 2022-07-06 DIAGNOSIS — M25551 Pain in right hip: Secondary | ICD-10-CM

## 2022-07-06 DIAGNOSIS — G8929 Other chronic pain: Secondary | ICD-10-CM | POA: Insufficient documentation

## 2022-07-06 DIAGNOSIS — M47816 Spondylosis without myelopathy or radiculopathy, lumbar region: Secondary | ICD-10-CM | POA: Diagnosis not present

## 2022-07-06 DIAGNOSIS — Z6841 Body Mass Index (BMI) 40.0 and over, adult: Secondary | ICD-10-CM

## 2022-07-06 DIAGNOSIS — S46011S Strain of muscle(s) and tendon(s) of the rotator cuff of right shoulder, sequela: Secondary | ICD-10-CM

## 2022-07-06 DIAGNOSIS — T84028A Dislocation of other internal joint prosthesis, initial encounter: Secondary | ICD-10-CM

## 2022-07-06 DIAGNOSIS — M172 Bilateral post-traumatic osteoarthritis of knee: Secondary | ICD-10-CM

## 2022-07-06 DIAGNOSIS — M25552 Pain in left hip: Secondary | ICD-10-CM | POA: Diagnosis not present

## 2022-07-06 DIAGNOSIS — F331 Major depressive disorder, recurrent, moderate: Secondary | ICD-10-CM

## 2022-07-06 DIAGNOSIS — M160 Bilateral primary osteoarthritis of hip: Secondary | ICD-10-CM | POA: Diagnosis not present

## 2022-07-06 NOTE — Patient Instructions (Signed)
______________________________________________________________________  Preparing for your procedure  Appointments: If you think you may not be able to keep your appointment, call 24-48 hours in advance to cancel. We need time to make it available to others.  During your procedure appointment there will be: No Prescription Refills. No disability issues to discussed. No medication changes or discussions.  Instructions: Food intake: Avoid eating anything solid for at least 8 hours prior to your procedure. Clear liquid intake: You may take clear liquids such as water up to 2 hours prior to your procedure. (No carbonated drinks. No soda.) Transportation: Unless otherwise stated by your physician, bring a driver. Morning Medicines: Except for blood thinners, take all of your other morning medications with a sip of water. Make sure to take your heart and blood pressure medicines. If your blood pressure's lower number is above 100, the case will be rescheduled. Blood thinners: Make sure to stop your blood thinners as instructed.  If you take a blood thinner, but were not instructed to stop it, call our office [phone] and ask to talk to a nurse. Not stopping a blood thinner prior to certain procedures could lead to serious complications. Diabetics on insulin: Notify the staff so that you can be scheduled 1st case in the morning. If your diabetes requires high dose insulin, take only  of your normal insulin dose the morning of the procedure and notify the staff that you have done so. Preventing infections: Shower with an antibacterial soap the morning of your procedure.  Build-up your immune system: Take 1000 mg of Vitamin C with every meal (3 times a day) the day prior to your procedure. Antibiotics: Inform the nursing staff if you are taking any antibiotics or if you have any conditions that may require antibiotics prior to procedures. (Example: recent joint implants)   Pregnancy: If you are  pregnant make sure to notify the nursing staff. Not doing so may result in injury to the fetus, including death.  Sickness: If you have a cold, fever, or any active infections, call and cancel or reschedule your procedure. Receiving steroids while having an infection may result in complications. Arrival: You must be in the facility at least 30 minutes prior to your scheduled procedure. [place]: Your scheduled time is also the cutoff time. If you do not arrive at least 15 minutes prior to your procedure, you will be rescheduled.  Children: Do not bring any children with you. Make arrangements to keep them home. Dress appropriately: There is always a possibility that your clothing may get soiled. Avoid long dresses. Valuables: Do not bring any jewelry or valuables.  Reasons to call and reschedule or cancel your procedure: (Following these recommendations will minimize the risk of a serious complication.) Surgeries: Avoid having procedures within 2 weeks of any surgery. (Avoid for 2 weeks before or after any surgery). Flu Shots: Avoid having procedures within 2 weeks of a flu shots or . (Avoid for 2 weeks before or after immunizations). Barium: Avoid having a procedure within 7-10 days after having had a radiological study involving the use of radiological contrast. (Myelograms, Barium swallow or enema study). Heart attacks: Avoid any elective procedures or surgeries for the initial 6 months after a "Myocardial Infarction" (Heart Attack). Blood thinners: It is imperative that you stop these medications before procedures. Let us know if you if you take any blood thinner.  Infection: Avoid procedures during or within two weeks of an infection (including chest colds or gastrointestinal problems). Symptoms associated with infections  include: Localized redness, fever, chills, night sweats or profuse sweating, burning sensation when voiding, cough, congestion, stuffiness, runny nose, sore throat, diarrhea,  nausea, vomiting, cold or Flu symptoms, recent or current infections. It is specially important if the infection is over the area that we intend to treat. Heart and lung problems: Symptoms that may suggest an active cardiopulmonary problem include: cough, chest pain, breathing difficulties or shortness of breath, dizziness, ankle swelling, uncontrolled high or unusually low blood pressure, and/or palpitations. If you are experiencing any of these symptoms, cancel your procedure and contact your primary care physician for an evaluation.  Remember:  Regular Business hours are:  Monday to Thursday 8:00 AM to 4:00 PM  Provider's Schedule: Milinda Pointer, MD:  Procedure days: Tuesday and Thursday 7:30 AM to 4:00 PM  Gillis Santa, MD:  Procedure days: Monday and Wednesday 7:30 AM to 4:00 PM  ______________________________________________________________________

## 2022-07-06 NOTE — Progress Notes (Signed)
Safety precautions to be maintained throughout the outpatient stay will include: orient to surroundings, keep bed in low position, maintain call bell within reach at all times, provide assistance with transfer out of bed and ambulation.  

## 2022-07-06 NOTE — Progress Notes (Signed)
Patient: Anna Keller  Service Category: E/M  Provider: Gillis Santa, MD  DOB: March 10, 1971  DOS: 07/06/2022  Referring Provider: Lahoma Rocker, MD  MRN: [id number]  Setting: Ambulatory outpatient  PCP: Evern Bio, NP  Type: New Patient  Specialty: Interventional Pain Management    Location: Office  Delivery: Face-to-face     Primary Reason(s) for Visit: Encounter for initial evaluation of one or more chronic problems (new to examiner) potentially causing chronic pain, and posing a threat to normal musculoskeletal function. (Level of risk: High) CC: Other (RA and OA affecting her whole body. ) and Joint Pain  HPI  Anna Keller is a 52 y.o. year old, female patient, who comes for the first time to our practice referred by Lahoma Rocker, MD for our initial evaluation of her chronic pain. 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 Today she comes in for evaluation of her Other (RA and OA affecting her whole body. ) and Joint Pain  Pain Assessment: Location: Other (Comment) (generalized) Other (Comment) (reports she has pain all over, hands, feet, back, neck etc caused by RA and osteoarthritis) Radiating: denies Onset: More than a month ago Duration: Chronic pain Quality: Discomfort, Constant, Aching, Tingling (right shoulder is joint replacement x 2, 1st one was unstable and failed.) Severity: 9 /10 (subjective,  self-reported pain score)  Effect on ADL: reports that she doesn't do much of anything because of the pain Timing: Constant Modifying factors: nothing currently is helping BP: (!) 117/93  HR: 97  Onset and Duration: Gradual and Present longer than 3 months Cause of pain:  RA and OA  Severity: Getting worse, NAS-11 at its worse: 10/10, NAS-11 at its best: 8/10, NAS-11 now: 10/10, and NAS-11 on the average: 10/10 Timing: Not influenced by the time of the day Aggravating Factors: Motion, Prolonged sitting, Prolonged standing, Walking, Walking uphill, and Walking downhill Alleviating Factors:  NA  Associated Problems: Depression, Fatigue, Sadness, and Pain that does not allow patient to sleep Quality of Pain: Aching, Agonizing, Annoying, Constant, Disabling, Exhausting, Horrible, Nagging, and Uncomfortable Previous Examinations or Tests: The patient denies na Previous Treatments: Narcotic medications, Physical Therapy, Steroid treatments by mouth, and TENS  Anna Keller is being evaluated for possible interventional pain management therapies for the treatment of her chronic pain.   Chronic pain secondary to RA, and OA. Multiple pain generators  Right shoulder pain, reverse total arthroplasty(2023). Hx of rotator cuff arthopathy. Has been told that she needs to have left shoulder arthroscopic surgery Bilateral knee pain related to severe tricompartmental OA. Left knee pain is worse than right knee pain. Has had IA steroid injection for knee and gel injections in bilateral knee with limited response. Has been told that she needs replacement and not a candidate for knee replacement given her weight Hx of morbid obesity- has tried to get approval for Wegovy and Monjuro but insurance denied Chronic low back pain, bilateral SI joint pain, bilateral hip pain, this has not been worked up yet.  It is constant.  She states that  she has trouble ambulating.  Anna Keller has been informed that this  initial visit was an evaluation only.  On the follow up appointment I will go over the results, including ordered tests and available interventional therapies. At that time she will have the opportunity to decide whether to proceed with offered therapies or not. In the event that Ms. Driggs prefers avoiding interventional options, this will conclude our involvement in the case.  Medication management recommendations may be provided upon request.  Meds   Current Outpatient Medications:    ALPRAZolam (XANAX) 1 MG tablet, Take 1 tablet (1 mg total) by mouth 3 (three) times daily as needed for anxiety., Disp: 30 tablet, Rfl: 1   amphetamine-dextroamphetamine (ADDERALL) 20 MG tablet, Take 20 mg by mouth 2 (two) times daily., Disp: , Rfl:    Calcium Carb-Cholecalciferol (CALCIUM 600+D3 PO), Take 1 tablet by mouth 3 (three) times daily., Disp: , Rfl:    cholecalciferol (VITAMIN D3) 25 MCG (1000 UNIT) tablet, Take 1,000 Units by mouth daily., Disp: , Rfl:    clobetasol cream (TEMOVATE) [id number] %, Apply 1 application  topically daily as needed (irritation)., Disp: , Rfl:    diclofenac (VOLTAREN) 50 MG EC tablet, Take 50 mg by mouth 2 (two) times daily., Disp: , Rfl:    FLUoxetine (PROZAC) 20 MG capsule, Take 1 capsule (20 mg total) by mouth daily., Disp: 30 capsule, Rfl: 2   HUMIRA, 2 PEN, 40 MG/0.8ML PNKT, Inject 40 mg into the skin every 14 (fourteen) days., Disp: , Rfl:    levocetirizine (XYZAL) 5 MG tablet, Take 5 mg by mouth every evening., Disp: , Rfl:    Multiple Vitamins-Minerals (MULTIVITAMIN WITH MINERALS) tablet, Take 1 tablet by mouth daily., Disp: , Rfl:    omeprazole (PRILOSEC) 20 MG capsule, Take 1 capsule by mouth once daily, Disp: 90 capsule, Rfl: 0   polyethylene glycol powder (GLYCOLAX/MIRALAX) 17 GM/SCOOP powder, Take 17 g by mouth daily., Disp: 3350 g, Rfl: 3   rosuvastatin (CRESTOR) 40 MG tablet, Take 40 mg by mouth every evening., Disp: , Rfl:    traMADol (ULTRAM) 50 MG tablet, Take 1  tablet (50 mg total) by mouth every 12 (twelve) hours as needed., Disp: 30 tablet, Rfl: 2   venlafaxine XR (EFFEXOR-XR) 150 MG 24 hr capsule, Take 1 capsule (150 mg total) by mouth daily. (Patient taking differently: Take 300 mg by mouth daily with breakfast.), Disp: 60 capsule, Rfl: 6  Imaging Review   Narrative CLINICAL DATA:  Right shoulder pain.  Painful range of motion.  EXAM: MRI OF THE RIGHT SHOULDER WITHOUT CONTRAST  TECHNIQUE: Multiplanar, multisequence MR imaging of the shoulder was performed. No intravenous contrast was administered.  COMPARISON:  None Available.  FINDINGS: Rotator cuff: High-grade partial-thickness articular surface, near complete, tear with a few intact bursal surface fibers. Complete tear of the infraspinatus tendon with 3.2 cm of retraction. Teres minor tendon is intact. Subscapularis tendon is intact.  Muscles: Mild atrophy of the supraspinatus and infraspinatus muscles. Remainder the muscles demonstrate no focal abnormality. No intramuscular fluid collection or hematoma.  Biceps Long Head: Moderate tendinosis of the intra-articular portion of the long head of the biceps tendon.  Acromioclavicular Joint: Moderate arthropathy of the acromioclavicular joint. Small amount of subacromial/subdeltoid bursal fluid.  Glenohumeral Joint: Large joint effusion with synovitis. Nodular synovitis in the subcoracoid recess. Partial-thickness cartilage loss of the glenohumeral joint.  Labrum: Superior and posterior labral degeneration.  Bones: No fracture or dislocation. No aggressive osseous lesion.  Other: No  fluid collection or hematoma.  IMPRESSION: 1. High-grade partial-thickness articular surface, near complete, tear with a few intact bursal surface fibers. 2. Complete tear of the infraspinatus tendon with 3.2 cm of retraction. 3. Moderate tendinosis of the intra-articular portion of the long head of the biceps tendon. 4. Large joint effusion  with synovitis. Nodular synovitis in the subcoracoid recess. Partial-thickness cartilage loss of the glenohumeral joint.   Electronically Signed By: Kathreen Devoid M.D. On: 11/26/2021 06:47   Narrative CLINICAL DATA:  pre op Reverse shoulder arthroplasty. Traumatic right rotator cuff tear.  EXAM: CT OF THE UPPER RIGHT EXTREMITY WITHOUT CONTRAST  TECHNIQUE: Multidetector CT imaging of the upper right extremity was performed according to the standard protocol.  RADIATION DOSE REDUCTION: This exam was performed according to the departmental dose-optimization program which includes automated exposure control, adjustment of the mA and/or kV according to patient size and/or use of iterative reconstruction technique.  COMPARISON:  MRI 11/25/2021  FINDINGS: Bones/Joint/Cartilage  There is superior subluxation of the humeral head in relation of the glenoid fossa in keeping with thinning and/or disruption of the rotator cuff. No acute fracture. No lytic or blastic bone lesion. Mild acromioclavicular and glenohumeral degenerative arthritis.  Ligaments  Suboptimally assessed by CT.  Muscles and Tendons  There is asymmetric fatty atrophy of the supraspinatus and infraspinatus muscles, similar to that noted on prior MRI examination.  Soft tissues  Small right shoulder effusion persists, similar to prior examination.  IMPRESSION: 1. Superior subluxation of the humeral head in relation of the glenoid fossa in keeping with thinning and/or disruption of the rotator cuff. 2. Mild acromioclavicular and glenohumeral degenerative arthritis. 3. Asymmetric fatty atrophy of the supraspinatus and infraspinatus muscles, similar to that noted on prior MRI examination.   Electronically Signed By: Fidela Salisbury M.D. On: 12/04/2021 19:21   Narrative CLINICAL DATA:  Pain.  History of total right shoulder arthroplasty.  EXAM: RIGHT SHOULDER - 1 VIEW  COMPARISON:  Right shoulder  radiographs 03/17/2022 and 01/05/2022  FINDINGS: Postsurgical changes of total right shoulder arthroplasty. The glenoid is mildly obliqued on limited single view. There is appropriate contact of the glenoid dome prosthesis and humeral prosthesis. No perihardware lucency is seen to indicate hardware failure or loosening. Moderate acromioclavicular joint space narrowing and peripheral osteophytosis. No acute fracture or dislocation.  IMPRESSION: 1. Status post total right shoulder arthroplasty without evidence of hardware failure on limited single frontal view. 2. Moderate acromioclavicular osteoarthritis.   Electronically Signed By: Yvonne Kendall M.D. On: 03/19/2022 18:57   Narrative CLINICAL DATA:  Status post right shoulder replacement.  EXAM: RIGHT SHOULDER - 2+ VIEW  COMPARISON:  04/01/2022  FINDINGS: Satisfactory appearance of the right shoulder prosthesis without fracture or dislocation.  IMPRESSION: Satisfactory appearance of a right shoulder prosthesis.   Electronically Signed By: Claudie Revering M.D. On: 04/16/2022 14:42  Complexity Note: Imaging results reviewed.                         ROS  Cardiovascular: No reported cardiovascular signs or symptoms such as High blood pressure, coronary artery disease, abnormal heart rate or rhythm, heart attack, blood thinner therapy or heart weakness and/or failure Pulmonary or Respiratory: No reported pulmonary signs or symptoms such as wheezing and difficulty taking a deep full breath (Asthma), difficulty blowing air out (Emphysema), coughing up mucus (Bronchitis), persistent dry cough, or temporary stoppage of breathing during sleep Neurological: No reported neurological signs or symptoms such as seizures, abnormal  skin sensations, urinary and/or fecal incontinence, being born with an abnormal open spine and/or a tethered spinal cord Psychological-Psychiatric: Anxiousness, Depressed, Prone to panicking, and Difficulty  sleeping and or falling asleep Gastrointestinal: Reflux or heatburn, Alternating episodes iof diarrhea and constipation (IBS-Irritable bowe syndrome), and Irregular, infrequent bowel movements (Constipation) Genitourinary: No reported renal or genitourinary signs or symptoms such as difficulty voiding or producing urine, peeing blood, non-functioning kidney, kidney stones, difficulty emptying the bladder, difficulty controlling the flow of urine, or chronic kidney disease Hematological: Brusing easily Endocrine: No reported endocrine signs or symptoms such as high or low blood sugar, rapid heart rate due to high thyroid levels, obesity or weight gain due to slow thyroid or thyroid disease Rheumatologic: Joint aches and or swelling due to excess weight (Osteoarthritis) and Rheumatoid arthritis Musculoskeletal: Negative for myasthenia gravis, muscular dystrophy, multiple sclerosis or malignant hyperthermia Work History: Out of work due to pain  Allergies  Ms. Cariker has No Known Allergies.  Laboratory Chemistry Profile   Renal Lab Results  Component Value Date   BUN 16 04/02/2022   CREATININE 0.86 [id number]   BCR NOT APPLICABLE [id number]   GFRNONAA >60 04/02/2022   SPECGRAV 1.015 02/01/2022   PHUR 6.0 02/01/2022   PROTEINUR Negative 02/01/2022     Electrolytes Lab Results  Component Value Date   NA 139 04/02/2022   K 4.4 04/02/2022   CL 105 04/02/2022   CALCIUM 9.3 04/02/2022     Hepatic Lab Results  Component Value Date   AST 13 07/14/2018   ALT 15 07/14/2018     ID Lab Results  Component Value Date   HIV NON-REACTIVE 07/14/2018   SARSCOV2NAA NEGATIVE 04/13/2021   STAPHAUREUS POSITIVE (A) 12/29/2021   MRSAPCR POSITIVE (A) 12/29/2021   PREGTESTUR NEGATIVE 04/14/2021     Bone No results found for: "VD25OH", "VD125OH2TOT", "[id number]", "[id number]", "25OHVITD1", "25OHVITD2", "25OHVITD3", "TESTOFREE", "TESTOSTERONE"   Endocrine Lab Results  Component Value Date    GLUCOSE 163 (H) 04/02/2022   GLUCOSEU Negative 02/01/2022   HGBA1C 5.0 07/14/2018   TSH 1.59 07/14/2018     Neuropathy Lab Results  Component Value Date   HGBA1C 5.0 07/14/2018   HIV NON-REACTIVE 07/14/2018     CNS No results found for: "COLORCSF", "APPEARCSF", "RBCCOUNTCSF", "WBCCSF", "POLYSCSF", "LYMPHSCSF", "EOSCSF", "PROTEINCSF", "GLUCCSF", "JCVIRUS", "CSFOLI", "IGGCSF", "LABACHR", "ACETBL"   Inflammation (CRP: Acute  ESR: Chronic) Lab Results  Component Value Date   CRP 4.8 07/14/2018   ESRSEDRATE 2 07/14/2018     Rheumatology Lab Results  Component Value Date   ANA NEGATIVE 07/14/2018     Coagulation Lab Results  Component Value Date   PLT 276 04/01/2022     Cardiovascular Lab Results  Component Value Date   HGB 13.0 04/01/2022   HCT 40.3 04/01/2022     Screening Lab Results  Component Value Date   SARSCOV2NAA NEGATIVE 04/13/2021   STAPHAUREUS POSITIVE (A) 12/29/2021   MRSAPCR POSITIVE (A) 12/29/2021   HIV NON-REACTIVE 07/14/2018   PREGTESTUR NEGATIVE 04/14/2021     Cancer No results found for: "CEA", "CA125", "LABCA2"   Allergens No results found for: "ALMOND", "APPLE", "ASPARAGUS", "AVOCADO", "BANANA", "BARLEY", "BASIL", "BAYLEAF", "GREENBEAN", "LIMABEAN", "WHITEBEAN", "BEEFIGE", "REDBEET", "BLUEBERRY", "BROCCOLI", "CABBAGE", "MELON", "CARROT", "CASEIN", "CASHEWNUT", "CAULIFLOWER", "CELERY"     Note: Lab results reviewed.  PFSH  Drug: Ms. Zuloaga  reports no history of drug use. Alcohol:  reports that she does not currently use alcohol. Tobacco:  reports that she quit smoking about 6 years ago. Her smoking use included  cigarettes. She has a 25.00 pack-year smoking history. She has never used smokeless tobacco. Medical:  has a past medical history of ADHD (attention deficit hyperactivity disorder), Ankylosing spondylitis ([place]), Anxiety, Arthritis, Depression, Eczema, Family history of adverse reaction to anesthesia, Headache, and Rheumatoid  arthritis ([place]). Family: family history includes Arthritis in her father; Breast cancer in her cousin, maternal aunt, and maternal grandmother; COPD in her sister; Diabetes in her sister; Heart attack in her paternal grandmother; Heart disease in her sister; Hypertension in her mother; Pulmonary Hypertension in her sister.  Past Surgical History:  Procedure Laterality Date   BLADDER SUSPENSION N/A 04/14/2021   Procedure: TRANSVAGINAL TAPE (TVT) PROCEDURE;  Surgeon: Sanjuana Kava, MD;  Location: [place];  Service: Gynecology;  Laterality: N/A;  GYNCARE TVT EXACT   CYSTOSCOPY N/A 04/14/2021   Procedure: CYSTOSCOPY;  Surgeon: Sanjuana Kava, MD;  Location: [place];  Service: Gynecology;  Laterality: N/A;   KNEE ARTHROSCOPY Left 1988   REVERSE SHOULDER ARTHROPLASTY Right 01/05/2022   Procedure: RIGHT SHOULDER REVERSE SHOULDER ARTHROPLASTY;  Surgeon: Vanetta Mulders, MD;  Location: [place];  Service: Orthopedics;  Laterality: Right;   REVERSE SHOULDER ARTHROPLASTY Right 04/01/2022   Procedure: RIGHT REVERSE SHOULDER ARTHROPLASTY REVISION;  Surgeon: Vanetta Mulders, MD;  Location: [place];  Service: Orthopedics;  Laterality: Right;   TONSILLECTOMY     as a child   TOTAL LAPAROSCOPIC HYSTERECTOMY WITH SALPINGECTOMY Bilateral 04/14/2021   Procedure: TOTAL LAPAROSCOPIC HYSTERECTOMY WITH BILATERAL SALPINGECTOMY AND BILATERAL OOPHERECTOMY;  Surgeon: Sanjuana Kava, MD;  Location: [place];  Service: Gynecology;  Laterality: Bilateral;   Active Ambulatory Problems    Diagnosis Date Noted   Pain in joint, multiple sites 07/14/2018   Family history of arthritis 07/14/2018   Peeling skin 07/14/2018   Class 3 severe obesity due to excess calories with body mass index (BMI) of 40.0 to 44.9 in adult [facility]) 07/14/2018   Anxiety 07/14/2018   Depression 07/14/2018   Grief reaction 03/23/2019   Gastroesophageal reflux disease without esophagitis 03/23/2019   Constipation 03/23/2019   Eczema 03/23/2019   S/P laparoscopic  hysterectomy 04/14/2021   Traumatic complete tear of right rotator cuff    Unilateral primary osteoarthritis, left knee 01/27/2022   Status post reverse arthroplasty of right shoulder 04/01/2022   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[place]) 05/05/2022   Joint inflammation 06/10/2022   Obesity (BMI 35.0-39.9 without comorbidity) 06/10/2022   Resolved Ambulatory Problems    Diagnosis Date Noted   IUD (intrauterine device) in place 07/14/2018   Bright red rectal bleeding 07/14/2018   Fibroid uterus 10/31/2018   Fibroids 04/14/2021   Past Medical History:  Diagnosis Date   ADHD (attention deficit hyperactivity disorder)    Ankylosing spondylitis ([place])    Arthritis    Family history of adverse reaction to anesthesia    Headache    Rheumatoid arthritis ([place])    Constitutional Exam  General appearance: Well nourished, well developed, and well hydrated. In no apparent acute distress Vitals:   07/06/22 1309  BP: (!) 117/93  Pulse: 97  Resp: 16  Temp: (!) 97 F (36.1 C)  TempSrc: Temporal  SpO2: 98%  Weight: 295 lb (133.8 kg)  Height: '5\' 6"'$  (1.676 m)   BMI Assessment: Estimated body mass index is 47.61 kg/m as calculated from the following:   Height as of this encounter: '5\' 6"'$  (1.676 m).   Weight as of this encounter: 295 lb (133.8 kg).  BMI interpretation table: BMI level Category Range association with  higher incidence of chronic pain  <18 kg/m2 Underweight   18.5-24.9 kg/m2 Ideal body weight   25-29.9 kg/m2 Overweight Increased incidence by 20%  30-34.9 kg/m2 Obese (Class I) Increased incidence by 68%  35-39.9 kg/m2 Severe obesity (Class II) Increased incidence by 136%  >40 kg/m2 Extreme obesity (Class III) Increased incidence by 254%   Patient's current BMI Ideal Body weight  Body mass index is 47.61 kg/m. Ideal body weight: 59.3 kg (130 lb 11.7 oz) Adjusted ideal body weight: 89.1 kg (196 lb 7 oz)   BMI Readings from Last 4 Encounters:   07/06/22 47.61 kg/m  07/01/22 48.26 kg/m  06/10/22 48.07 kg/m  04/01/22 44.39 kg/m   Wt Readings from Last 4 Encounters:  07/06/22 295 lb (133.8 kg)  07/01/22 299 lb (135.6 kg)  06/10/22 297 lb 12.8 oz (135.1 kg)  04/01/22 275 lb (124.7 kg)    Psych/Mental status: Alert, oriented x 3 (person, place, & time)       Eyes: PERLA Respiratory: No evidence of acute respiratory distress  Cervical Spine Area Exam  Skin & Axial Inspection: No masses, redness, edema, swelling, or associated skin lesions Alignment: Symmetrical Functional ROM: Pain restricted ROM, bilaterally Stability: No instability detected Muscle Tone/Strength: Functionally intact. No obvious neuro-muscular anomalies detected. Sensory (Neurological): Musculoskeletal pain pattern Palpation: No palpable anomalies             Upper Extremity (UE) Exam    Side: Right upper extremity  Side: Left upper extremity  Skin & Extremity Inspection: Skin color, temperature, and hair growth are WNL. No peripheral edema or cyanosis. No masses, redness, swelling, asymmetry, or associated skin lesions. No contractures.  Skin & Extremity Inspection: Skin color, temperature, and hair growth are WNL. No peripheral edema or cyanosis. No masses, redness, swelling, asymmetry, or associated skin lesions. No contractures.  Functional ROM: Pain restricted ROM for shoulder  Functional ROM: Unrestricted ROM          Muscle Tone/Strength: Functionally intact. No obvious neuro-muscular anomalies detected.  Muscle Tone/Strength: Functionally intact. No obvious neuro-muscular anomalies detected.  Sensory (Neurological): Neurogenic pain pattern and musculoskeletal          Sensory (Neurological): Unimpaired          Palpation: No palpable anomalies              Palpation: No palpable anomalies              Provocative Test(s):  Phalen's test: deferred Tinel's test: deferred Apley's scratch test (touch opposite shoulder):  Action 1 (Across chest):  Decreased ROM Action 2 (Overhead): Decreased ROM Action 3 (LB reach): Decreased ROM   Provocative Test(s):  Phalen's test: deferred Tinel's test: deferred Apley's scratch test (touch opposite shoulder):  Action 1 (Across chest): deferred Action 2 (Overhead): deferred Action 3 (LB reach): deferred    Thoracic Spine Area Exam  Skin & Axial Inspection: No masses, redness, or swelling Alignment: Symmetrical Functional ROM: Pain restricted ROM Stability: No instability detected Muscle Tone/Strength: Functionally intact. No obvious neuro-muscular anomalies detected. Sensory (Neurological): Musculoskeletal pain pattern Muscle strength & Tone: No palpable anomalies Lumbar Spine Area Exam  Skin & Axial Inspection: No masses, redness, or swelling Alignment: Symmetrical Functional ROM: Pain restricted ROM affecting both sides Stability: No instability detected Muscle Tone/Strength: Functionally intact. No obvious neuro-muscular anomalies detected. Sensory (Neurological): Musculoskeletal pain pattern Palpation: No palpable anomalies       Provocative Tests: Hyperextension/rotation test: (+) bilaterally for facet joint pain.  Lower Extremity Exam  Side: Right lower extremity  Side: Left lower extremity  Stability: No instability observed          Stability: No instability observed          Skin & Extremity Inspection: Skin color, temperature, and hair growth are WNL. No peripheral edema or cyanosis. No masses, redness, swelling, asymmetry, or associated skin lesions. No contractures.  Skin & Extremity Inspection: Skin color, temperature, and hair growth are WNL. No peripheral edema or cyanosis. No masses, redness, swelling, asymmetry, or associated skin lesions. No contractures.  Functional ROM: Unrestricted ROM                  Functional ROM: Unrestricted ROM                  Muscle Tone/Strength: Functionally intact. No obvious neuro-muscular anomalies detected.  Muscle Tone/Strength:  Functionally intact. No obvious neuro-muscular anomalies detected.  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 Diagnoses of Status post reverse arthroplasty of right shoulder, Traumatic complete tear of right rotator cuff, sequela, Class 3 severe obesity due to excess calories with body mass index (BMI) of 40.0 to 44.9 in adult, unspecified whether serious comorbidity present ([place]), Joint inflammation, Moderate episode of recurrent major depressive disorder (HCC), Chronic bilateral low back pain without sciatica, Chronic SI joint pain, Bilateral hip pain, and Bilateral post-traumatic osteoarthritis of knee were also pertinent to this visit.  Visit Diagnosis (New problems to examiner): 1. 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 Status post reverse arthroplasty of right shoulder   3. Traumatic complete tear of right rotator cuff, sequela   4. Class 3 severe obesity due to excess calories with body mass index (BMI) of 40.0 to 44.9 in adult, unspecified whether serious comorbidity present ([place])   5. Joint inflammation   6. Moderate episode of recurrent major depressive disorder ([place])   7. Chronic bilateral low back pain without sciatica   8. Chronic SI joint pain   9. Bilateral hip pain   10. Bilateral post-traumatic osteoarthritis of knee    Plan of Care (Initial workup plan)  Multiple pain generators including osteoarthritis, chronic musculoskeletal pain as well as osteoarthritis.  She has a history of morbid obesity that contributes to  her chronic pain syndrome.  In regards to her right shoulder pain status post reverse total shoulder arthroplasty, we discussed right suprascapular nerve block and possible RFA as a treatment option.  We also discussed axillary nerve peripheral nerve stimulation.  She would like to start with suprascapular nerve block.  For her low back, bilateral hip and SI joint pain recommend further workup via diagnostic imaging as below.   For her bilateral knee pain related to knee osteoarthritis, discussed bilateral genicular nerve block and subsequently possibly RFA.  I also emphasized the importance of performing exercise at home for strength and range of motion  Imaging Orders         DG Lumbar Spine Complete W/Bend         DG Si Joints    Bilateral hip x-ray as well  Procedure Orders         SUPRASCAPULAR NERVE BLOCK         GENICULAR NERVE BLOCK       Interventional management options: Ms. Hinkson was informed that there is no guarantee that she would be a candidate for interventional therapies. The decision will be based on the results of diagnostic studies, as well as Ms. Simar's risk profile.  Procedure(s) under consideration:  Pending results of ordered studies   Provider-requested follow-up: Return for Right SSNB , in clinic NS.  Future Appointments  Date Time Provider [place]  07/08/2022  2:30 PM Selinda Eon, PT [facility]  07/15/2022  2:30 PM Selinda Eon, PT [facility]  07/21/2022 11:00 AM Vanetta Mulders, MD [facility]  07/21/2022 11:45 AM Selinda Eon, PT [facility]  07/29/2022  2:30 PM Selinda Eon, PT [facility]  11/01/2022 11:00 AM Evern Bio, NP AMA-AMA None    Duration of encounter: 52mnutes.  Total time on encounter, as per AMA guidelines included both the face-to-face and non-face-to-face time personally spent by the physician and/or other qualified health care professional(s) on the day of the encounter (includes time in  activities that require the physician or other qualified health care professional and does not include time in activities normally performed by clinical staff). Physician's time may include the following activities when performed: Preparing to see the patient (e.g., pre-charting review of records, searching for previously ordered imaging, lab work, and nerve conduction tests) Review of prior analgesic pharmacotherapies. Reviewing PMP Interpreting ordered tests (e.g., lab work, imaging, nerve conduction tests) Performing post-procedure evaluations, including interpretation of diagnostic procedures Obtaining and/or reviewing separately obtained history Performing a medically appropriate examination and/or evaluation Counseling and educating the patient/family/caregiver Ordering medications, tests, or procedures Referring and communicating with other health care professionals (when not separately reported) Documenting clinical information in the electronic or other health record Independently interpreting results (not separately reported) and communicating results to the patient/ family/caregiver Care coordination (not separately reported)  Note by: BGillis Santa MD (TTS technology used. I apologize for any typographical errors that were not detected and corrected.) Date: 07/06/2022; Time: 3:17 PM

## 2022-07-08 ENCOUNTER — Ambulatory Visit (HOSPITAL_BASED_OUTPATIENT_CLINIC_OR_DEPARTMENT_OTHER): Payer: 59 | Attending: Orthopaedic Surgery | Admitting: Physical Therapy

## 2022-07-08 ENCOUNTER — Encounter: Payer: Self-pay | Admitting: Radiology

## 2022-07-08 DIAGNOSIS — M25511 Pain in right shoulder: Secondary | ICD-10-CM | POA: Insufficient documentation

## 2022-07-08 DIAGNOSIS — S46011A Strain of muscle(s) and tendon(s) of the rotator cuff of right shoulder, initial encounter: Secondary | ICD-10-CM | POA: Insufficient documentation

## 2022-07-08 DIAGNOSIS — M25611 Stiffness of right shoulder, not elsewhere classified: Secondary | ICD-10-CM | POA: Insufficient documentation

## 2022-07-13 ENCOUNTER — Encounter (HOSPITAL_BASED_OUTPATIENT_CLINIC_OR_DEPARTMENT_OTHER): Payer: 59 | Admitting: Physical Therapy

## 2022-07-13 NOTE — Therapy (Unsigned)
OUTPATIENT PHYSICAL THERAPY SHOULDER TREATMENT/ RECERT ***   Patient Name: Anna Keller MRN: [id number] DOB:1971-02-23, 52 y.o., female Today's Date: 07/13/2022            Past Medical History:  Diagnosis Date   ADHD (attention deficit hyperactivity disorder)    Ankylosing spondylitis ([place])    Anxiety    Arthritis    Depression    Eczema    Family history of adverse reaction to anesthesia    dad would be crazy, talk out of his head-per pt   Headache    Rheumatoid arthritis [facility])    Past Surgical History:  Procedure Laterality Date   BLADDER SUSPENSION N/A 04/14/2021   Procedure: TRANSVAGINAL TAPE (TVT) PROCEDURE;  Surgeon: Sanjuana Kava, MD;  Location: [place];  Service: Gynecology;  Laterality: N/A;  GYNCARE TVT EXACT   CYSTOSCOPY N/A 04/14/2021   Procedure: CYSTOSCOPY;  Surgeon: Sanjuana Kava, MD;  Location: [place];  Service: Gynecology;  Laterality: N/A;   KNEE ARTHROSCOPY Left 1988   REVERSE SHOULDER ARTHROPLASTY Right 01/05/2022   Procedure: RIGHT SHOULDER REVERSE SHOULDER ARTHROPLASTY;  Surgeon: Vanetta Mulders, MD;  Location: [place];  Service: Orthopedics;  Laterality: Right;   REVERSE SHOULDER ARTHROPLASTY Right 04/01/2022   Procedure: RIGHT REVERSE SHOULDER ARTHROPLASTY REVISION;  Surgeon: Vanetta Mulders, MD;  Location: [place];  Service: Orthopedics;  Laterality: Right;   TONSILLECTOMY     as a child   TOTAL LAPAROSCOPIC HYSTERECTOMY WITH SALPINGECTOMY Bilateral 04/14/2021   Procedure: TOTAL LAPAROSCOPIC HYSTERECTOMY WITH BILATERAL SALPINGECTOMY AND BILATERAL OOPHERECTOMY;  Surgeon: Sanjuana Kava, MD;  Location: [place];  Service: Gynecology;  Laterality: Bilateral;   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) 05/05/2022   Status post reverse arthroplasty of right shoulder 04/01/2022   Unilateral  primary osteoarthritis, left knee 01/27/2022   Traumatic complete tear of right rotator cuff    S/P laparoscopic hysterectomy 04/14/2021   Grief reaction 03/23/2019   Gastroesophageal reflux disease without esophagitis 03/23/2019   Constipation 03/23/2019   Eczema 03/23/2019   Pain in joint, multiple sites 07/14/2018   Family history of arthritis 07/14/2018   Peeling skin 07/14/2018   Class 3 severe obesity due to excess calories with body mass index (BMI) of 40.0 to 44.9 in adult [facility]) 07/14/2018   Anxiety 07/14/2018   Depression 07/14/2018     REFERRING PROVIDER: Vanetta Mulders, MD  REFERRING DIAG: S46.011A (ICD-10-CM) - Traumatic complete tear of right rotator cuff, initial encounter Right R.TSA with biceps tenodesis, NO subscap repair  THERAPY DIAG:  No diagnosis found.  Rationale for Evaluation and Treatment Rehabilitation  ONSET DATE: DOS initial rTSA 01/05/22       DOS revision 04/01/22  SUBJECTIVE:  SUBJECTIVE STATEMENT:  Deltoid is just so tight. I fell again so my knee is killing me, it just gives. I have fallen about 3 times now. Just finished prednisone dose pack.   PERTINENT HISTORY: RA  PAIN:  Are you having pain? Yes: NPRS scale:  /10 Pain location: right shoulder Pain description: not a lot of pain, just sore, tight muscle pain Aggravating factors: constant Relieving factors: rest  PRECAUTIONS: Shoulder  WEIGHT BEARING RESTRICTIONSnone on 2/5  FALLS:  Has patient fallen in last 6 months? Yes. 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  PLOF: Independent  PATIENT GOALS decr pain, use arm (Rt hand dominant)  OBJECTIVE:   PATIENT SURVEYS:  FOTO 35 - FOTO no longer found in system on 2/5  UEFI 06/07/22: 15/90  (anxiety with circling)   POSTURE: 2/5 WFL  UPPER EXTREMITY ROM:    ROM  Right eval Right  10/12 Rt PROM 04/08/22 Rt A//PROM 2/5 Rt AROM  Shoulder flexion 60 (passive) 98 90 62//90 60  Shoulder extension       Shoulder abduction  46 25 40//90    Shoulder adduction       Shoulder internal rotation       Shoulder external rotation       (Blank rows = not tested)  UPPER EXTREMITY MMT:  Not appropriate to test at this time   PALPATION:  04/08/22: Guarded, upper trap tension Plapable tightness through middle deltoid   TODAY'S TREATMENT:   Treatment                            06/24/22:  Duanne Limerick for Lt patellar support IASTM Rt deltoid & biceps Pulleys flexoin & scaption Pulley to just above 90 deg with 3s activation hold Estim IFC with ice, attended 15 min Discussed aquatic exercise & aquatic PT   Treatment                            06/10/22:  IASTM Rt deltoid, incision; seated ant to post joint mobs Pulleys flexion Scapular retraction assisted by PT ER AROM with gentle resistance applied by PT Seated long axis activation toward abd   Treatment                            2/5:  MANUAL: PROM- flx/abd with distraction, scar tissue massage W reach to head, forward flexion with thumbs up    PATIENT EDUCATION: Education details: Anatomy of condition, POC, HEP, exercise form/rationale Person educated: Patient Education method: Explanation, Demonstration, Tactile cues, Verbal cues, and Handouts Education comprehension: verbalized understanding, returned demonstration, verbal cues required, tactile cues required, and needs further education   HOME EXERCISE PROGRAM: [id number]  ASSESSMENT:  CLINICAL IMPRESSION: Pt reported significant tightness in deltoid. Tenderness that was reported as pain when palpating AC joint. Passively the shoulder is improving to greater than 90 deg when pt is in control using pulleys, was able to add activation in elevated position on  pulleys. Will do an aquatics exercise next week in order to establish home program addressing shoulder strength as well as lower body strength. She has fallen 3 times recently and is at high risk of injuring the shoulder if not addressed.    OBJECTIVE IMPAIRMENTS decreased activity tolerance, decreased mobility, decreased ROM, decreased strength, increased edema, increased muscle spasms, impaired  flexibility, impaired UE functional use, improper body mechanics, postural dysfunction, and pain.   ACTIVITY LIMITATIONS carrying, lifting, transfers, bed mobility, dressing, and hygiene/grooming  PARTICIPATION LIMITATIONS: meal prep, cleaning, laundry, driving, shopping, community activity, and occupation  [place] and 1-2 comorbidities: RA, obesity  are also affecting patient's functional outcome.   REHAB POTENTIAL: Good  CLINICAL DECISION MAKING: Stable/uncomplicated  EVALUATION COMPLEXITY: Low   GOALS: Goals reviewed with patient? Yes  SHORT TERM GOALS: Target date: 04/29/2022   1.  Active ER to 30 deg in standing without shoulder elevation Baseline: unable at eval Goal status: achieved  2.  AROM Elevation to at least 90 deg with good scapular control Baseline: see chart Goal status: achieved  3.  Passive flexion to 130 Baseline: see chart Goal status: achieved  4.  Able to demo full elbow AROM without incr pain Baseline: elbow and wrist motions begun at eval Goal status: achieved    LONG TERM GOALS: goal date # weeks post op, revision   Days since surgery: 104  Addressed 06/07/22  Fire all heads of deltoid Baseline: unable at eval Goal status: achieved Target date:   (4wk)  2.  Active shoulder elevation to 130 Baseline: see chart Goal status: ongoing Target date:   (6wk)  3.  Average pain <=3/10 in daily activities out of sling, aware of when to take rest breaks for pain managemenet Baseline: always at 7-8/10 Goal status: ongoing Target date:    (6wk)  4.  Demo ability to reach light objects, such as empty dishes, into cabinets above shoulder height with involved UE Baseline: significant difficulty Goal status:ongoing Target date:   (10 wk)  5.  Able to progress weight lifting tolerance to 10lb pain <=3/10 Baseline: unable Goal status:ongoing Target date:   (10 wk)  6.  Able to demo at least 75% strength via hand held dynamometry testing in straight plane motions compared to opp UE Baseline: not appropriate to test today Goal status: ongoing Target date:   (12 wk)  7.  Able to return to work without limitations Baseline: had to leave her original job Goal status: ongoing Target date:   (12 wk)   PLAN: PT FREQUENCY: 1-2x/week  PT DURATION: 12 weeks  PLANNED INTERVENTIONS: Therapeutic exercises, Therapeutic activity, Neuromuscular re-education, Patient/Family education, Self Care, Joint mobilization, Aquatic Therapy, Dry Needling, Electrical stimulation, Spinal mobilization, Cryotherapy, Moist heat, scar mobilization, Taping, Vasopneumatic device, Manual therapy, and Re-evaluation  PLAN FOR NEXT SESSION: , progress as tolerated do not use ktape, has skin reaction/allergy    Jessica C. Hightower PT, DPT 07/13/22 1:15 PM

## 2022-07-14 ENCOUNTER — Encounter: Payer: Self-pay | Admitting: Nurse Practitioner

## 2022-07-14 ENCOUNTER — Ambulatory Visit: Payer: 59 | Attending: Orthopaedic Surgery | Admitting: Physical Therapy

## 2022-07-14 ENCOUNTER — Encounter: Payer: Self-pay | Admitting: Physical Therapy

## 2022-07-14 DIAGNOSIS — M25611 Stiffness of right shoulder, not elsewhere classified: Secondary | ICD-10-CM | POA: Insufficient documentation

## 2022-07-14 DIAGNOSIS — M25511 Pain in right shoulder: Secondary | ICD-10-CM | POA: Insufficient documentation

## 2022-07-15 ENCOUNTER — Other Ambulatory Visit: Payer: Self-pay | Admitting: Nurse Practitioner

## 2022-07-15 ENCOUNTER — Encounter (HOSPITAL_BASED_OUTPATIENT_CLINIC_OR_DEPARTMENT_OTHER): Payer: 59 | Admitting: Physical Therapy

## 2022-07-15 DIAGNOSIS — F419 Anxiety disorder, unspecified: Secondary | ICD-10-CM

## 2022-07-15 MED ORDER — ALPRAZOLAM 1 MG PO TABS: 1.0000 mg | ORAL_TABLET | Freq: Three times a day (TID) | ORAL | 1 refills | Status: DC | PRN

## 2022-07-19 ENCOUNTER — Encounter (HOSPITAL_BASED_OUTPATIENT_CLINIC_OR_DEPARTMENT_OTHER): Payer: 59 | Admitting: Physical Therapy

## 2022-07-19 ENCOUNTER — Ambulatory Visit: Payer: 59 | Admitting: Physical Therapy

## 2022-07-21 ENCOUNTER — Ambulatory Visit: Payer: 59 | Admitting: Physical Therapy

## 2022-07-21 ENCOUNTER — Ambulatory Visit (INDEPENDENT_AMBULATORY_CARE_PROVIDER_SITE_OTHER): Payer: 59

## 2022-07-21 ENCOUNTER — Encounter (HOSPITAL_COMMUNITY): Payer: Self-pay | Admitting: Orthopaedic Surgery

## 2022-07-21 ENCOUNTER — Encounter (HOSPITAL_BASED_OUTPATIENT_CLINIC_OR_DEPARTMENT_OTHER): Payer: Self-pay | Admitting: Physical Therapy

## 2022-07-21 ENCOUNTER — Other Ambulatory Visit: Payer: Self-pay

## 2022-07-21 ENCOUNTER — Ambulatory Visit (INDEPENDENT_AMBULATORY_CARE_PROVIDER_SITE_OTHER): Payer: 59 | Admitting: Orthopaedic Surgery

## 2022-07-21 ENCOUNTER — Ambulatory Visit (HOSPITAL_BASED_OUTPATIENT_CLINIC_OR_DEPARTMENT_OTHER): Payer: Self-pay | Admitting: Orthopaedic Surgery

## 2022-07-21 ENCOUNTER — Other Ambulatory Visit (HOSPITAL_BASED_OUTPATIENT_CLINIC_OR_DEPARTMENT_OTHER): Payer: Self-pay

## 2022-07-21 DIAGNOSIS — Z471 Aftercare following joint replacement surgery: Secondary | ICD-10-CM | POA: Diagnosis not present

## 2022-07-21 DIAGNOSIS — Z96611 Presence of right artificial shoulder joint: Secondary | ICD-10-CM | POA: Diagnosis not present

## 2022-07-21 DIAGNOSIS — Z9889 Other specified postprocedural states: Secondary | ICD-10-CM | POA: Diagnosis not present

## 2022-07-21 MED ORDER — IBUPROFEN 800 MG PO TABS
800.0000 mg | ORAL_TABLET | Freq: Three times a day (TID) | ORAL | 0 refills | Status: AC
  Filled 2022-07-21: qty 30, 10d supply, fill #0

## 2022-07-21 MED ORDER — ACETAMINOPHEN 500 MG PO TABS
500.0000 mg | ORAL_TABLET | Freq: Three times a day (TID) | ORAL | 0 refills | Status: AC
  Filled 2022-07-21: qty 30, 10d supply, fill #0

## 2022-07-21 MED ORDER — OXYCODONE HCL 5 MG PO TABS
5.0000 mg | ORAL_TABLET | ORAL | 0 refills | Status: DC | PRN
  Filled 2022-07-21: qty 20, 4d supply, fill #0

## 2022-07-21 MED ORDER — ASPIRIN 325 MG PO TBEC
325.0000 mg | DELAYED_RELEASE_TABLET | Freq: Every day | ORAL | 0 refills | Status: DC
  Filled 2022-07-21: qty 30, 30d supply, fill #0

## 2022-07-21 NOTE — Progress Notes (Addendum)
SDW call  Patient was given pre-op instructions over the phone. Patient verbalized understanding of instructions provided.     PCP - Dr. Charlynn Grimes Cardiologist - Denies Pulmonary: Denies Rheumatology: Dr. Lahoma Rocker   PPM/ICD - Denies Device Orders - n/a Rep Notified - n/a   Chest x-ray - n/a EKG -  n/a Stress Test - ECHO -  Cardiac Cath -   Sleep Study/sleep apnea/CPAP Denies  Non-diabetic Fasting Blood sugar range denies How often check sugars denies   Blood Thinner Instructions: Denies Aspirin Instructions: Denies   ERAS Protcol - Yes, clear liquids until 1030 PRE-SURGERY Ensure or G2- No   COVID TEST- n/a     Anesthesia review No:    Patient denies shortness of breath, fever, cough and chest pain over the phone call    Your procedure is scheduled on Thursday 07/22/2022  Report to [place] Entrance "A" at  1100  A.M., then check in with the Admitting office.  Call this number if you have problems the morning of surgery:  [phone]   If you have any questions prior to your surgery date call [phone]: Open Monday-Friday 8am-4pm If you experience any cold or flu symptoms such as cough, fever, chills, shortness of breath, etc. between now and your scheduled surgery, please notify us at the above number     Remember:  Do not eat after midnight the night before your surgery  You may drink clear liquids until 1030  the morning of your surgery.   Clear liquids allowed are: Water, Non-Citrus Juices (without pulp), Carbonated Beverages, Clear Tea, Black Coffee ONLY (NO MILK, CREAM OR POWDERED CREAMER of any kind), and Gatorade   Take these medicines the morning of surgery with A SIP OF WATER:  Prozac, prilosec, tramadol  As needed: Xanax, oxycodone   As of today, STOP taking any Aspirin (unless otherwise instructed by your surgeon) Aleve, Naproxen, Ibuprofen, Motrin, Advil, Goody's, BC's, all herbal medications, fish oil, and all vitamins.

## 2022-07-21 NOTE — Progress Notes (Signed)
Post Operative Evaluation    Procedure/Date of Surgery: Right revision shoulder arthroplasty 04/01/22  Interval History:   Presents today for follow-up of her right shoulder revision arthroplasty for known instability.  Unfortunately she states since she last has seen me she has had a total of 4 falls due to her left knee pain and she is not able to essentially walk normally without falling.  She most recently had a fall the previous Saturday at which time she states she drove all of her body weight into a door.  She felt something pop at that time.  Since this time she has had very limited range of motion about the shoulder.  Denies any redness or swelling about the incision.  PMH/PSH/Family History/Social History/Meds/Allergies:    Past Medical History:  Diagnosis Date   ADHD (attention deficit hyperactivity disorder)    Ankylosing spondylitis ([place])    Anxiety    Arthritis    Depression    Eczema    Family history of adverse reaction to anesthesia    dad would be crazy, talk out of his head-per pt   Headache    Rheumatoid arthritis ([place])    Past Surgical History:  Procedure Laterality Date   BLADDER SUSPENSION N/A 04/14/2021   Procedure: TRANSVAGINAL TAPE (TVT) PROCEDURE;  Surgeon: Sanjuana Kava, MD;  Location: [place];  Service: Gynecology;  Laterality: N/A;  GYNCARE TVT EXACT   CYSTOSCOPY N/A 04/14/2021   Procedure: CYSTOSCOPY;  Surgeon: Sanjuana Kava, MD;  Location: [facility];  Service: Gynecology;  Laterality: N/A;   KNEE ARTHROSCOPY Left 1988   REVERSE SHOULDER ARTHROPLASTY Right 01/05/2022   Procedure: RIGHT SHOULDER REVERSE SHOULDER ARTHROPLASTY;  Surgeon: Vanetta Mulders, MD;  Location: [place];  Service: Orthopedics;  Laterality: Right;   REVERSE SHOULDER ARTHROPLASTY Right 04/01/2022   Procedure: RIGHT REVERSE SHOULDER ARTHROPLASTY REVISION;  Surgeon: Vanetta Mulders, MD;  Location: [place];  Service: Orthopedics;  Laterality: Right;   TONSILLECTOMY      as a child   TOTAL LAPAROSCOPIC HYSTERECTOMY WITH SALPINGECTOMY Bilateral 04/14/2021   Procedure: TOTAL LAPAROSCOPIC HYSTERECTOMY WITH BILATERAL SALPINGECTOMY AND BILATERAL OOPHERECTOMY;  Surgeon: Sanjuana Kava, MD;  Location: [place];  Service: Gynecology;  Laterality: Bilateral;   Social History   Socioeconomic History   Marital status: Married    Spouse name: Legrand Como   Number of children: 1   Years of education: 12   Highest education level: High school graduate  Occupational History   Not on file  Tobacco Use   Smoking status: Former    Packs/day: 1.00    Years: 25.00    Total pack years: 25.00    Types: Cigarettes    Quit date: 01/09/2016    Years since quitting: 6.3   Smokeless tobacco: Never  Vaping Use   Vaping Use: Never used  Substance and Sexual Activity   Alcohol use: Not Currently   Drug use: No   Sexual activity: Yes    Partners: Male    Birth control/protection: None  Other Topics Concern   Not on file  Social History Narrative   Not on file   Social Determinants of Health   Financial Resource Strain: Low Risk  (07/14/2018)   Overall Financial Resource Strain (CARDIA)    Difficulty of Paying Living Expenses: Not hard at all  Food Insecurity: No  Food Insecurity (07/14/2018)   Hunger Vital Sign    Worried About Running Out of Food in the Last Year: Never true    Ran Out of Food in the Last Year: Never true  Transportation Needs: No Transportation Needs (04/02/2022)   PRAPARE - Hydrologist (Medical): No    Lack of Transportation (Non-Medical): No  Physical Activity: Inactive (07/14/2018)   Exercise Vital Sign    Days of Exercise per Week: 0 days    Minutes of Exercise per Session: 0 min  Stress: No Stress Concern Present (07/14/2018)   [place]    Feeling of Stress : Only a little  Social Connections: Somewhat Isolated (07/14/2018)   Social Connection and  Isolation Panel [NHANES]    Frequency of Communication with Friends and Family: More than three times a week    Frequency of Social Gatherings with Friends and Family: More than three times a week    Attends Religious Services: Never    Marine scientist or Organizations: No    Attends Music therapist: Never    Marital Status: Married   Family History  Problem Relation Age of Onset   Hypertension Mother    Arthritis Father    Heart disease Sister    Pulmonary Hypertension Sister    COPD Sister    Diabetes Sister    Breast cancer Maternal Grandmother    Heart attack Paternal Grandmother    Breast cancer Maternal Aunt    Breast cancer Cousin    No Known Allergies Current Outpatient Medications  Medication Sig Dispense Refill   traMADol (ULTRAM) 50 MG tablet Take 1 tablet (50 mg total) by mouth every 12 (twelve) hours as needed. 30 tablet 2   ALPRAZolam (XANAX) 1 MG tablet Take 1 mg by mouth 3 (three) times daily as needed for anxiety.     amphetamine-dextroamphetamine (ADDERALL) 20 MG tablet Take 20 mg by mouth 2 (two) times daily.     aspirin EC 325 MG tablet Take 1 tablet (325 mg total) by mouth daily. 30 tablet 0   busPIRone (BUSPAR) 7.5 MG tablet Take 7.5 mg by mouth 3 (three) times daily.     Calcium Carb-Cholecalciferol (CALCIUM 600+D3 PO) Take 1 tablet by mouth 3 (three) times daily.     cholecalciferol (VITAMIN D3) 25 MCG (1000 UNIT) tablet Take 1,000 Units by mouth daily.     clobetasol cream (TEMOVATE) AB-123456789 % Apply 1 application  topically daily as needed (irritation).     diclofenac (VOLTAREN) 50 MG EC tablet Take 50 mg by mouth 2 (two) times daily.     levocetirizine (XYZAL) 5 MG tablet Take 5 mg by mouth every evening.     Multiple Vitamins-Minerals (MULTIVITAMIN WITH MINERALS) tablet Take 1 tablet by mouth daily.     omeprazole (PRILOSEC) 20 MG capsule Take 1 capsule by mouth once daily 90 capsule 0   oxyCODONE (ROXICODONE) 5 MG immediate release  tablet Take 1 tablet (5 mg total) by mouth every 4 (four) hours as needed for severe pain or breakthrough pain. 30 tablet 0   polyethylene glycol powder (GLYCOLAX/MIRALAX) 17 GM/SCOOP powder Take 17 g by mouth daily. 3350 g 3   rosuvastatin (CRESTOR) 40 MG tablet Take 40 mg by mouth every evening.     traZODone (DESYREL) 150 MG tablet Take 150 mg by mouth at bedtime as needed for sleep.     venlafaxine XR (EFFEXOR-XR) 150  MG 24 hr capsule Take 1 capsule (150 mg total) by mouth daily. (Patient taking differently: Take 300 mg by mouth daily with breakfast.) 60 capsule 6   No current facility-administered medications for this visit.   No results found.  Review of Systems:   A ROS was performed including pertinent positives and negatives as documented in the HPI.   Musculoskeletal Exam:     Right shoulder incision is well-healed.  She able to flex and extend at the right elbow.  She has essentially no active forward motion or external rotation at the side and this was not tested due to failure of the glenosphere today.  She is firing all held of the deltoid and she does have sensation in the axillary distribution compared to the contralateral side.  2+ radial pulse   Imaging:    3 views right shoulder: Status post revision arthroplasty with glenosphere displacement and broken screw  I personally reviewed and interpreted the radiographs.   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  Overall this is quite unfortunate.  I have described that I am quite concerned about her increasing falls and she has not seen multiple surgeons for her left knee but she has not been able to get any additional treatment for this.  I did describe that I am very concerned about her shoulder arthroplasty going  forward as additional falls will leave her at risk of instability.  While we can revise her glenoid to hopefully get her some type of quality of outcome, without the ability to not fall regularly I described that this could place her long-term outcome at jeopardy.  I have continued described complications with revision surgery such as neurovascular injury or infection.  At this time she would like to undergo revision surgery and I will plan to proceed with this cautiously although again I am very concerned about her history of falls and inability to protect the shoulder.  I did describe that she had additional complications happen following the surgery that I do not necessarily believe she would be a candidate for additional revision surgery given her persistent inability to ambulate without falls   Plan :    -Plan for right shoulder revision arthroplasty   After a lengthy discussion of treatment options, including risks, benefits, alternatives, complications of surgical and nonsurgical conservative options, the patient elected surgical repair.   The patient  is aware of the material risks  and complications including, but not limited to injury to adjacent structures, neurovascular injury, infection, numbness, bleeding, implant failure, thermal burns, stiffness, persistent pain, failure to heal, disease transmission from allograft, need for further surgery, dislocation, anesthetic risks, blood clots, risks of death,and others. The probabilities of surgical success and failure discussed with patient given their particular co-morbidities.The time and nature of expected rehabilitation and recovery was discussed.The patient's questions were all answered preoperatively.  No barriers to understanding were noted. I explained the natural history of the disease process and Rx rationale.  I explained to the patient what I considered to be reasonable expectations given their personal situation.  The final treatment  plan was arrived at through a shared patient decision making process model.       I personally saw and evaluated the patient, and participated in the management and treatment plan.  Vanetta Mulders, MD Attending  Physician, Orthopedic Surgery  This document was dictated using Dragon voice recognition software. A reasonable attempt at proof reading has been made to minimize errors.

## 2022-07-21 NOTE — H&P (View-Only) (Signed)
Post Operative Evaluation    Procedure/Date of Surgery: Right revision shoulder arthroplasty 04/01/22  Interval History:   Presents today for follow-up of her right shoulder revision arthroplasty for known instability.  Unfortunately she states since she last has seen me she has had a total of 4 falls due to her left knee pain and she is not able to essentially walk normally without falling.  She most recently had a fall the previous Saturday at which time she states she drove all of her body weight into a door.  She felt something pop at that time.  Since this time she has had very limited range of motion about the shoulder.  Denies any redness or swelling about the incision.  PMH/PSH/Family History/Social History/Meds/Allergies:    Past Medical History:  Diagnosis Date   ADHD (attention deficit hyperactivity disorder)    Ankylosing spondylitis ([place])    Anxiety    Arthritis    Depression    Eczema    Family history of adverse reaction to anesthesia    dad would be crazy, talk out of his head-per pt   Headache    Rheumatoid arthritis ([place])    Past Surgical History:  Procedure Laterality Date   BLADDER SUSPENSION N/A 04/14/2021   Procedure: TRANSVAGINAL TAPE (TVT) PROCEDURE;  Surgeon: Sanjuana Kava, MD;  Location: [facility];  Service: Gynecology;  Laterality: N/A;  GYNCARE TVT EXACT   CYSTOSCOPY N/A 04/14/2021   Procedure: CYSTOSCOPY;  Surgeon: Sanjuana Kava, MD;  Location: [facility];  Service: Gynecology;  Laterality: N/A;   KNEE ARTHROSCOPY Left 1988   REVERSE SHOULDER ARTHROPLASTY Right 01/05/2022   Procedure: RIGHT SHOULDER REVERSE SHOULDER ARTHROPLASTY;  Surgeon: Vanetta Mulders, MD;  Location: [facility];  Service: Orthopedics;  Laterality: Right;   REVERSE SHOULDER ARTHROPLASTY Right 04/01/2022   Procedure: RIGHT REVERSE SHOULDER ARTHROPLASTY REVISION;  Surgeon: Vanetta Mulders, MD;  Location: [facility];  Service: Orthopedics;  Laterality: Right;   TONSILLECTOMY      as a child   TOTAL LAPAROSCOPIC HYSTERECTOMY WITH SALPINGECTOMY Bilateral 04/14/2021   Procedure: TOTAL LAPAROSCOPIC HYSTERECTOMY WITH BILATERAL SALPINGECTOMY AND BILATERAL OOPHERECTOMY;  Surgeon: Sanjuana Kava, MD;  Location: [facility];  Service: Gynecology;  Laterality: Bilateral;   Social History   Socioeconomic History   Marital status: Married    Spouse name: Legrand Como   Number of children: 1   Years of education: 12   Highest education level: High school graduate  Occupational History   Not on file  Tobacco Use   Smoking status: Former    Packs/day: 1.00    Years: 25.00    Total pack years: 25.00    Types: Cigarettes    Quit date: 01/09/2016    Years since quitting: 6.3   Smokeless tobacco: Never  Vaping Use   Vaping Use: Never used  Substance and Sexual Activity   Alcohol use: Not Currently   Drug use: No   Sexual activity: Yes    Partners: Male    Birth control/protection: None  Other Topics Concern   Not on file  Social History Narrative   Not on file   Social Determinants of Health   Financial Resource Strain: Low Risk  (07/14/2018)   Overall Financial Resource Strain (CARDIA)    Difficulty of Paying Living Expenses: Not hard at all  Food Insecurity: No  Food Insecurity (07/14/2018)   Hunger Vital Sign    Worried About Running Out of Food in the Last Year: Never true    Ran Out of Food in the Last Year: Never true  Transportation Needs: No Transportation Needs (04/02/2022)   PRAPARE - Hydrologist (Medical): No    Lack of Transportation (Non-Medical): No  Physical Activity: Inactive (07/14/2018)   Exercise Vital Sign    Days of Exercise per Week: 0 days    Minutes of Exercise per Session: 0 min  Stress: No Stress Concern Present (07/14/2018)   [place]    Feeling of Stress : Only a little  Social Connections: Somewhat Isolated (07/14/2018)   Social Connection and  Isolation Panel [NHANES]    Frequency of Communication with Friends and Family: More than three times a week    Frequency of Social Gatherings with Friends and Family: More than three times a week    Attends Religious Services: Never    Marine scientist or Organizations: No    Attends Music therapist: Never    Marital Status: Married   Family History  Problem Relation Age of Onset   Hypertension Mother    Arthritis Father    Heart disease Sister    Pulmonary Hypertension Sister    COPD Sister    Diabetes Sister    Breast cancer Maternal Grandmother    Heart attack Paternal Grandmother    Breast cancer Maternal Aunt    Breast cancer Cousin    No Known Allergies Current Outpatient Medications  Medication Sig Dispense Refill   traMADol (ULTRAM) 50 MG tablet Take 1 tablet (50 mg total) by mouth every 12 (twelve) hours as needed. 30 tablet 2   ALPRAZolam (XANAX) 1 MG tablet Take 1 mg by mouth 3 (three) times daily as needed for anxiety.     amphetamine-dextroamphetamine (ADDERALL) 20 MG tablet Take 20 mg by mouth 2 (two) times daily.     aspirin EC 325 MG tablet Take 1 tablet (325 mg total) by mouth daily. 30 tablet 0   busPIRone (BUSPAR) 7.5 MG tablet Take 7.5 mg by mouth 3 (three) times daily.     Calcium Carb-Cholecalciferol (CALCIUM 600+D3 PO) Take 1 tablet by mouth 3 (three) times daily.     cholecalciferol (VITAMIN D3) 25 MCG (1000 UNIT) tablet Take 1,000 Units by mouth daily.     clobetasol cream (TEMOVATE) AB-123456789 % Apply 1 application  topically daily as needed (irritation).     diclofenac (VOLTAREN) 50 MG EC tablet Take 50 mg by mouth 2 (two) times daily.     levocetirizine (XYZAL) 5 MG tablet Take 5 mg by mouth every evening.     Multiple Vitamins-Minerals (MULTIVITAMIN WITH MINERALS) tablet Take 1 tablet by mouth daily.     omeprazole (PRILOSEC) 20 MG capsule Take 1 capsule by mouth once daily 90 capsule 0   oxyCODONE (ROXICODONE) 5 MG immediate release  tablet Take 1 tablet (5 mg total) by mouth every 4 (four) hours as needed for severe pain or breakthrough pain. 30 tablet 0   polyethylene glycol powder (GLYCOLAX/MIRALAX) 17 GM/SCOOP powder Take 17 g by mouth daily. 3350 g 3   rosuvastatin (CRESTOR) 40 MG tablet Take 40 mg by mouth every evening.     traZODone (DESYREL) 150 MG tablet Take 150 mg by mouth at bedtime as needed for sleep.     venlafaxine XR (EFFEXOR-XR) 150  MG 24 hr capsule Take 1 capsule (150 mg total) by mouth daily. (Patient taking differently: Take 300 mg by mouth daily with breakfast.) 60 capsule 6   No current facility-administered medications for this visit.   No results found.  Review of Systems:   A ROS was performed including pertinent positives and negatives as documented in the HPI.   Musculoskeletal Exam:     Right shoulder incision is well-healed.  She able to flex and extend at the right elbow.  She has essentially no active forward motion or external rotation at the side and this was not tested due to failure of the glenosphere today.  She is firing all held of the deltoid and she does have sensation in the axillary distribution compared to the contralateral side.  2+ radial pulse   Imaging:    3 views right shoulder: Status post revision arthroplasty with glenosphere displacement and broken screw  I personally reviewed and interpreted the radiographs.   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  Overall this is quite unfortunate.  I have described that I am quite concerned about her increasing falls and she has not seen multiple surgeons for her left knee but she has not been able to get any additional treatment for this.  I did describe that I am very concerned about her shoulder arthroplasty going  forward as additional falls will leave her at risk of instability.  While we can revise her glenoid to hopefully get her some type of quality of outcome, without the ability to not fall regularly I described that this could place her long-term outcome at jeopardy.  I have continued described complications with revision surgery such as neurovascular injury or infection.  At this time she would like to undergo revision surgery and I will plan to proceed with this cautiously although again I am very concerned about her history of falls and inability to protect the shoulder.  I did describe that she had additional complications happen following the surgery that I do not necessarily believe she would be a candidate for additional revision surgery given her persistent inability to ambulate without falls   Plan :    -Plan for right shoulder revision arthroplasty   After a lengthy discussion of treatment options, including risks, benefits, alternatives, complications of surgical and nonsurgical conservative options, the patient elected surgical repair.   The patient  is aware of the material risks  and complications including, but not limited to injury to adjacent structures, neurovascular injury, infection, numbness, bleeding, implant failure, thermal burns, stiffness, persistent pain, failure to heal, disease transmission from allograft, need for further surgery, dislocation, anesthetic risks, blood clots, risks of death,and others. The probabilities of surgical success and failure discussed with patient given their particular co-morbidities.The time and nature of expected rehabilitation and recovery was discussed.The patient's questions were all answered preoperatively.  No barriers to understanding were noted. I explained the natural history of the disease process and Rx rationale.  I explained to the patient what I considered to be reasonable expectations given their personal situation.  The final treatment  plan was arrived at through a shared patient decision making process model.       I personally saw and evaluated the patient, and participated in the management and treatment plan.  Vanetta Mulders, MD Attending  Physician, Orthopedic Surgery  This document was dictated using Dragon voice recognition software. A reasonable attempt at proof reading has been made to minimize errors.

## 2022-07-22 ENCOUNTER — Encounter (HOSPITAL_BASED_OUTPATIENT_CLINIC_OR_DEPARTMENT_OTHER): Payer: 59 | Admitting: Physical Therapy

## 2022-07-22 ENCOUNTER — Encounter (HOSPITAL_COMMUNITY)
Admission: RE | Disposition: A | Discharge status: Home / Self Care | Payer: Self-pay | Source: Home / Self Care | Attending: Orthopaedic Surgery

## 2022-07-22 ENCOUNTER — Ambulatory Visit (HOSPITAL_COMMUNITY)
Admission: RE | Admit: 2022-07-22 | Discharge: 2022-07-22 | Disposition: A | Discharge status: Home / Self Care | Payer: 59 | Attending: Orthopaedic Surgery | Admitting: Orthopaedic Surgery

## 2022-07-22 ENCOUNTER — Encounter (HOSPITAL_COMMUNITY): Payer: Self-pay | Admitting: Orthopaedic Surgery

## 2022-07-22 ENCOUNTER — Ambulatory Visit (HOSPITAL_COMMUNITY): Payer: 59

## 2022-07-22 ENCOUNTER — Ambulatory Visit (HOSPITAL_BASED_OUTPATIENT_CLINIC_OR_DEPARTMENT_OTHER): Payer: 59 | Admitting: Anesthesiology

## 2022-07-22 ENCOUNTER — Ambulatory Visit (HOSPITAL_COMMUNITY): Payer: 59 | Admitting: Anesthesiology

## 2022-07-22 ENCOUNTER — Other Ambulatory Visit: Payer: Self-pay

## 2022-07-22 DIAGNOSIS — F418 Other specified anxiety disorders: Secondary | ICD-10-CM | POA: Diagnosis not present

## 2022-07-22 DIAGNOSIS — Z90710 Acquired absence of both cervix and uterus: Secondary | ICD-10-CM | POA: Insufficient documentation

## 2022-07-22 DIAGNOSIS — M25511 Pain in right shoulder: Secondary | ICD-10-CM | POA: Diagnosis not present

## 2022-07-22 DIAGNOSIS — Z87891 Personal history of nicotine dependence: Secondary | ICD-10-CM | POA: Diagnosis not present

## 2022-07-22 DIAGNOSIS — T84098A Other mechanical complication of other internal joint prosthesis, initial encounter: Secondary | ICD-10-CM

## 2022-07-22 DIAGNOSIS — M2550 Pain in unspecified joint: Secondary | ICD-10-CM

## 2022-07-22 DIAGNOSIS — G8918 Other acute postprocedural pain: Secondary | ICD-10-CM | POA: Diagnosis not present

## 2022-07-22 DIAGNOSIS — Z9889 Other specified postprocedural states: Secondary | ICD-10-CM | POA: Diagnosis present

## 2022-07-22 DIAGNOSIS — K219 Gastro-esophageal reflux disease without esophagitis: Secondary | ICD-10-CM | POA: Diagnosis not present

## 2022-07-22 DIAGNOSIS — Z96611 Presence of right artificial shoulder joint: Secondary | ICD-10-CM

## 2022-07-22 DIAGNOSIS — Z6841 Body Mass Index (BMI) 40.0 and over, adult: Secondary | ICD-10-CM

## 2022-07-22 DIAGNOSIS — T84018A Broken internal joint prosthesis, other site, initial encounter: Secondary | ICD-10-CM | POA: Diagnosis not present

## 2022-07-22 HISTORY — PX: REVERSE SHOULDER ARTHROPLASTY: SHX5054

## 2022-07-22 LAB — TYPE AND SCREEN
ABO/RH(D): O NEG
Antibody Screen: NEGATIVE

## 2022-07-22 LAB — SURGICAL PCR SCREEN
MRSA, PCR: NEGATIVE
Staphylococcus aureus: NEGATIVE

## 2022-07-22 LAB — CBC
HCT: 40.5 % (ref 36.0–46.0)
Hemoglobin: 12.9 g/dL (ref 12.0–15.0)
MCH: 28.6 pg (ref 26.0–34.0)
MCHC: 31.9 g/dL (ref 30.0–36.0)
MCV: 89.8 fL (ref 80.0–100.0)
Platelets: 304 10*3/uL (ref 150–400)
RBC: 4.51 MIL/uL (ref 3.87–5.11)
RDW: 15.9 % — ABNORMAL HIGH (ref 11.5–15.5)
WBC: 6.1 10*3/uL (ref 4.0–10.5)
nRBC: 0 % (ref 0.0–0.2)

## 2022-07-22 SURGERY — ARTHROPLASTY, SHOULDER, TOTAL, REVERSE
Anesthesia: Regional | Site: Shoulder | Laterality: Right

## 2022-07-22 MED ORDER — BUPIVACAINE LIPOSOME 1.3 % IJ SUSP
INTRAMUSCULAR | Status: AC
  Filled 2022-07-22: qty 10

## 2022-07-22 MED ORDER — FENTANYL CITRATE (PF) 250 MCG/5ML IJ SOLN
INTRAMUSCULAR | Status: DC | PRN
  Administered 2022-07-22: 150 ug via INTRAVENOUS

## 2022-07-22 MED ORDER — DEXAMETHASONE SODIUM PHOSPHATE 10 MG/ML IJ SOLN
INTRAMUSCULAR | Status: DC | PRN
  Administered 2022-07-22: 8 mg via INTRAVENOUS

## 2022-07-22 MED ORDER — ONDANSETRON HCL 4 MG/2ML IJ SOLN
INTRAMUSCULAR | Status: AC
  Filled 2022-07-22: qty 2

## 2022-07-22 MED ORDER — POVIDONE-IODINE 10 % EX SOLN
CUTANEOUS | Status: DC | PRN
  Administered 2022-07-22: 1 via TOPICAL

## 2022-07-22 MED ORDER — VANCOMYCIN HCL 1000 MG IV SOLR
INTRAVENOUS | Status: DC | PRN
  Administered 2022-07-22: 1000 mg via TOPICAL

## 2022-07-22 MED ORDER — DEXAMETHASONE SODIUM PHOSPHATE 10 MG/ML IJ SOLN
INTRAMUSCULAR | Status: AC
  Filled 2022-07-22: qty 1

## 2022-07-22 MED ORDER — FENTANYL CITRATE PF 50 MCG/ML IJ SOSY
50.0000 ug | PREFILLED_SYRINGE | Freq: Once | INTRAMUSCULAR | Status: AC
  Filled 2022-07-22: qty 1

## 2022-07-22 MED ORDER — ACETAMINOPHEN 500 MG PO TABS
1000.0000 mg | ORAL_TABLET | Freq: Once | ORAL | Status: AC
  Administered 2022-07-22: 1000 mg via ORAL
  Filled 2022-07-22: qty 2

## 2022-07-22 MED ORDER — DEXMEDETOMIDINE HCL IN NACL 80 MCG/20ML IV SOLN
INTRAVENOUS | Status: AC
  Filled 2022-07-22: qty 20

## 2022-07-22 MED ORDER — PHENYLEPHRINE HCL-NACL 20-0.9 MG/250ML-% IV SOLN
INTRAVENOUS | Status: DC | PRN
  Administered 2022-07-22: 40 ug/min via INTRAVENOUS

## 2022-07-22 MED ORDER — BUPIVACAINE LIPOSOME 1.3 % IJ SUSP
INTRAMUSCULAR | Status: DC | PRN
  Administered 2022-07-22: 10 mL via PERINEURAL

## 2022-07-22 MED ORDER — VANCOMYCIN HCL 1000 MG IV SOLR
INTRAVENOUS | Status: DC | PRN
  Administered 2022-07-22: 1000 mg via INTRAVENOUS

## 2022-07-22 MED ORDER — FENTANYL CITRATE (PF) 250 MCG/5ML IJ SOLN
INTRAMUSCULAR | Status: AC
  Filled 2022-07-22: qty 5

## 2022-07-22 MED ORDER — GABAPENTIN 300 MG PO CAPS
300.0000 mg | ORAL_CAPSULE | Freq: Once | ORAL | Status: AC
  Administered 2022-07-22: 300 mg via ORAL
  Filled 2022-07-22: qty 1

## 2022-07-22 MED ORDER — AMISULPRIDE (ANTIEMETIC) 5 MG/2ML IV SOLN
INTRAVENOUS | Status: AC
  Filled 2022-07-22: qty 4

## 2022-07-22 MED ORDER — TRANEXAMIC ACID-NACL 1000-0.7 MG/100ML-% IV SOLN
1000.0000 mg | INTRAVENOUS | Status: AC
  Administered 2022-07-22: 1000 mg via INTRAVENOUS
  Filled 2022-07-22: qty 100

## 2022-07-22 MED ORDER — VANCOMYCIN HCL IN DEXTROSE 1-5 GM/200ML-% IV SOLN
INTRAVENOUS | Status: AC
  Filled 2022-07-22: qty 200

## 2022-07-22 MED ORDER — 0.9 % SODIUM CHLORIDE (POUR BTL) OPTIME
TOPICAL | Status: DC | PRN
  Administered 2022-07-22: 1000 mL

## 2022-07-22 MED ORDER — METOPROLOL TARTRATE 5 MG/5ML IV SOLN
INTRAVENOUS | Status: AC
  Filled 2022-07-22: qty 5

## 2022-07-22 MED ORDER — LACTATED RINGERS IV SOLN: INTRAVENOUS | Status: DC | PRN

## 2022-07-22 MED ORDER — OXYCODONE HCL 5 MG/5ML PO SOLN: 5.0000 mg | Freq: Once | ORAL | Status: DC | PRN

## 2022-07-22 MED ORDER — FENTANYL CITRATE (PF) 100 MCG/2ML IJ SOLN: 25.0000 ug | INTRAMUSCULAR | Status: DC | PRN

## 2022-07-22 MED ORDER — ROCURONIUM BROMIDE 10 MG/ML (PF) SYRINGE
PREFILLED_SYRINGE | INTRAVENOUS | Status: AC
  Filled 2022-07-22: qty 10

## 2022-07-22 MED ORDER — SUGAMMADEX SODIUM 200 MG/2ML IV SOLN
INTRAVENOUS | Status: DC | PRN
  Administered 2022-07-22: 200 mg via INTRAVENOUS

## 2022-07-22 MED ORDER — AMISULPRIDE (ANTIEMETIC) 5 MG/2ML IV SOLN
10.0000 mg | Freq: Once | INTRAVENOUS | Status: AC
  Administered 2022-07-22: 10 mg via INTRAVENOUS

## 2022-07-22 MED ORDER — PROPOFOL 10 MG/ML IV BOLUS
INTRAVENOUS | Status: DC | PRN
  Administered 2022-07-22: 200 mg via INTRAVENOUS

## 2022-07-22 MED ORDER — OXYCODONE HCL 5 MG PO TABS: 5.0000 mg | ORAL_TABLET | Freq: Once | ORAL | Status: DC | PRN

## 2022-07-22 MED ORDER — LIDOCAINE 2% (20 MG/ML) 5 ML SYRINGE
INTRAMUSCULAR | Status: AC
  Filled 2022-07-22: qty 5

## 2022-07-22 MED ORDER — CHLORHEXIDINE GLUCONATE 0.12 % MT SOLN
OROMUCOSAL | Status: AC
  Administered 2022-07-22: 15 mL
  Filled 2022-07-22: qty 15

## 2022-07-22 MED ORDER — CEFAZOLIN IN SODIUM CHLORIDE 3-0.9 GM/100ML-% IV SOLN
3.0000 g | INTRAVENOUS | Status: AC
  Administered 2022-07-22: 2 g via INTRAVENOUS
  Filled 2022-07-22: qty 100

## 2022-07-22 MED ORDER — VANCOMYCIN HCL 1000 MG IV SOLR
INTRAVENOUS | Status: AC
  Filled 2022-07-22: qty 20

## 2022-07-22 MED ORDER — ROCURONIUM BROMIDE 10 MG/ML (PF) SYRINGE
PREFILLED_SYRINGE | INTRAVENOUS | Status: DC | PRN
  Administered 2022-07-22: 20 mg via INTRAVENOUS
  Administered 2022-07-22: 60 mg via INTRAVENOUS

## 2022-07-22 MED ORDER — SODIUM CHLORIDE 0.9 % IR SOLN
Status: DC | PRN
  Administered 2022-07-22: 1000 mL

## 2022-07-22 MED ORDER — ACETAMINOPHEN 10 MG/ML IV SOLN: 1000.0000 mg | Freq: Once | INTRAVENOUS | Status: DC | PRN

## 2022-07-22 MED ORDER — MIDAZOLAM HCL 2 MG/2ML IJ SOLN
INTRAMUSCULAR | Status: AC
  Administered 2022-07-22: 2 mg via INTRAVENOUS
  Filled 2022-07-22: qty 2

## 2022-07-22 MED ORDER — MIDAZOLAM HCL 2 MG/2ML IJ SOLN
2.0000 mg | Freq: Once | INTRAMUSCULAR | Status: AC
  Filled 2022-07-22: qty 2

## 2022-07-22 MED ORDER — BUPIVACAINE-EPINEPHRINE (PF) 0.5% -1:200000 IJ SOLN
INTRAMUSCULAR | Status: DC | PRN
  Administered 2022-07-22: 20 mL via PERINEURAL

## 2022-07-22 MED ORDER — ROCURONIUM BROMIDE 10 MG/ML (PF) SYRINGE
PREFILLED_SYRINGE | INTRAVENOUS | Status: AC
  Filled 2022-07-22: qty 20

## 2022-07-22 MED ORDER — ACETAMINOPHEN 160 MG/5ML PO SOLN: 1000.0000 mg | Freq: Once | ORAL | Status: DC | PRN

## 2022-07-22 MED ORDER — ACETAMINOPHEN 500 MG PO TABS: 1000.0000 mg | ORAL_TABLET | Freq: Once | ORAL | Status: DC | PRN

## 2022-07-22 MED ORDER — PROPOFOL 10 MG/ML IV BOLUS
INTRAVENOUS | Status: AC
  Filled 2022-07-22: qty 20

## 2022-07-22 MED ORDER — PHENYLEPHRINE 80 MCG/ML (10ML) SYRINGE FOR IV PUSH (FOR BLOOD PRESSURE SUPPORT)
PREFILLED_SYRINGE | INTRAVENOUS | Status: AC
  Filled 2022-07-22: qty 10

## 2022-07-22 MED ORDER — FENTANYL CITRATE (PF) 100 MCG/2ML IJ SOLN
INTRAMUSCULAR | Status: AC
  Administered 2022-07-22: 50 ug
  Filled 2022-07-22: qty 2

## 2022-07-22 SURGICAL SUPPLY — 73 items
AID PSTN UNV HD RSTRNT DISP (MISCELLANEOUS) ×1
ANCH SUT KNTLS STRL SHLDR SYS (Anchor) ×2 IMPLANT
ANCHOR SOFT DL #2 1.5 (Anchor) IMPLANT
ANCHOR SUT QUATTRO KNTLS 4.5 (Anchor) IMPLANT
APL PRP STRL LF DISP 70% ISPRP (MISCELLANEOUS) ×2
AUG BASEPLATE 15DEG 25 WEDGE (Joint) ×1 IMPLANT
AUGMENT BASEPLATE 15DEG 25 WDG (Joint) IMPLANT
BAG COUNTER SPONGE SURGICOUNT (BAG) ×1 IMPLANT
BAG SPNG CNTER NS LX DISP (BAG)
BIT DRILL 3.2 PERIPHERAL SCREW (BIT) IMPLANT
BLADE SAW SAG 29X58X.64 (BLADE) IMPLANT
BSPLAT GLND 15D 25 FULL WDG (Joint) ×1 IMPLANT
CHLORAPREP W/TINT 26 (MISCELLANEOUS) ×1 IMPLANT
COOLER ICEMAN CLASSIC (MISCELLANEOUS) ×1 IMPLANT
COVER SURGICAL LIGHT HANDLE (MISCELLANEOUS) ×1 IMPLANT
DRAPE IMP U-DRAPE 54X76 (DRAPES) ×1 IMPLANT
DRAPE INCISE IOBAN 66X45 STRL (DRAPES) ×1 IMPLANT
DRAPE U-SHAPE 47X51 STRL (DRAPES) ×2 IMPLANT
DRSG AQUACEL AG ADV 3.5X10 (GAUZE/BANDAGES/DRESSINGS) ×1 IMPLANT
DRSG XEROFORM 1X8 (GAUZE/BANDAGES/DRESSINGS) IMPLANT
ELECT BLADE 4.0 EZ CLEAN MEGAD (MISCELLANEOUS) ×1
ELECT REM PT RETURN 9FT ADLT (ELECTROSURGICAL) ×1
ELECTRODE BLDE 4.0 EZ CLN MEGD (MISCELLANEOUS) ×1 IMPLANT
ELECTRODE REM PT RTRN 9FT ADLT (ELECTROSURGICAL) ×1 IMPLANT
GLENOSPHERE CANN +3 (Orthopedic Implant) IMPLANT
GLOVE BIO SURGEON STRL SZ7.5 (GLOVE) ×3 IMPLANT
GLOVE BIOGEL PI IND STRL 6.5 (GLOVE) ×1 IMPLANT
GLOVE BIOGEL PI IND STRL 8 (GLOVE) ×2 IMPLANT
GLOVE ECLIPSE 6.0 STRL STRAW (GLOVE) ×1 IMPLANT
GLOVE INDICATOR 8.0 STRL GRN (GLOVE) ×1 IMPLANT
GOWN STRL REUS W/ TWL LRG LVL3 (GOWN DISPOSABLE) ×2 IMPLANT
GOWN STRL REUS W/TWL LRG LVL3 (GOWN DISPOSABLE) ×2
GUIDEWIRE GLENOID 2.5X220 (WIRE) IMPLANT
HANDPIECE INTERPULSE COAX TIP (DISPOSABLE) ×1
INSERT REVS RETENTIVE SZ 1/2 (Orthopedic Implant) IMPLANT
KIT BASIN OR (CUSTOM PROCEDURE TRAY) ×1 IMPLANT
KIT STABILIZATION SHOULDER (MISCELLANEOUS) ×1 IMPLANT
KIT TURNOVER KIT B (KITS) ×1 IMPLANT
MANIFOLD NEPTUNE II (INSTRUMENTS) ×1 IMPLANT
NDL HYPO 21X1 ECLIPSE (NEEDLE) ×1 IMPLANT
NDL MAYO TROCAR (NEEDLE) ×1 IMPLANT
NEEDLE HYPO 21X1 ECLIPSE (NEEDLE) ×1 IMPLANT
NEEDLE MAYO TROCAR (NEEDLE) ×1 IMPLANT
NS IRRIG 1000ML POUR BTL (IV SOLUTION) ×1 IMPLANT
PACK SHOULDER (CUSTOM PROCEDURE TRAY) ×1 IMPLANT
PACK UNIVERSAL I (CUSTOM PROCEDURE TRAY) ×1 IMPLANT
PAD ARMBOARD 7.5X6 YLW CONV (MISCELLANEOUS) ×2 IMPLANT
PAD COLD SHLDR WRAP-ON (PAD) ×1 IMPLANT
RESTRAINT HEAD UNIVERSAL NS (MISCELLANEOUS) ×1 IMPLANT
SCREW 5.0X18 (Screw) IMPLANT
SCREW 5.0X38 SMALL F/PERFORM (Screw) IMPLANT
SCREW CORT THRD AEQ 9.5X40 (Screw) IMPLANT
SCREW PERIPHERAL 30 (Screw) IMPLANT
SCREW POST THREADED 9.5X35 (Screw) IMPLANT
SET HNDPC FAN SPRY TIP SCT (DISPOSABLE) ×1 IMPLANT
SLING ARM FOAM STRAP LRG (SOFTGOODS) IMPLANT
SLING ARM IMMOBILIZER LRG (SOFTGOODS) ×1 IMPLANT
SPONGE T-LAP 18X18 ~~LOC~~+RFID (SPONGE) ×1 IMPLANT
STAPLER VISISTAT 35W (STAPLE) ×1 IMPLANT
SUCTION FRAZIER HANDLE 10FR (MISCELLANEOUS) ×1
SUCTION TUBE FRAZIER 10FR DISP (MISCELLANEOUS) ×1 IMPLANT
SUT FIBERWIRE #5 38 CONV NDL (SUTURE) ×2
SUT VIC AB 0 CT1 27 (SUTURE) ×2
SUT VIC AB 0 CT1 27XBRD ANBCTR (SUTURE) ×2 IMPLANT
SUT VIC AB 2-0 CT1 27 (SUTURE) ×4
SUT VIC AB 2-0 CT1 TAPERPNT 27 (SUTURE) ×2 IMPLANT
SUTURE FIBERWR #5 38 CONV NDL (SUTURE) ×2 IMPLANT
SYR 50ML LL SCALE MARK (SYRINGE) ×1 IMPLANT
TAPE LABRALWHITE 1.5X36 (TAPE) IMPLANT
TAPE SUT LABRALTAP WHT/BLK (SUTURE) IMPLANT
TOWEL GREEN STERILE (TOWEL DISPOSABLE) ×1 IMPLANT
TRAY FOLEY MTR SLVR 16FR STAT (SET/KITS/TRAYS/PACK) ×1 IMPLANT
WATER STERILE IRR 1000ML POUR (IV SOLUTION) ×1 IMPLANT

## 2022-07-22 NOTE — Interval H&P Note (Signed)
History and Physical Interval Note:  07/22/2022 12:41 PM  Anna Keller  has presented today for surgery, with the diagnosis of pain in shoulder.  The various methods of treatment have been discussed with the patient and family. After consideration of risks, benefits and other options for treatment, the patient has consented to  Procedure(s): SHOULDER REVISION  ARTHROPLASTY (Right) as a surgical intervention.  The patient's history has been reviewed, patient examined, no change in status, stable for surgery.  I have reviewed the patient's chart and labs.  Questions were answered to the patient's satisfaction.     Vanetta Mulders

## 2022-07-22 NOTE — Anesthesia Preprocedure Evaluation (Signed)
Anesthesia Evaluation  Patient identified by MRN, date of birth, ID band Patient awake    Reviewed: Allergy & Precautions, NPO status , Patient's Chart, lab work & pertinent test results  History of Anesthesia Complications Negative for: history of anesthetic complications  Airway Mallampati: III  TM Distance: >3 FB Neck ROM: Full    Dental no notable dental hx. (+) Dental Advisory Given   Pulmonary former smoker Quit smoking 2017, 25 pack year history  Snores at night, has never had sleep study    breath sounds clear to auscultation       Cardiovascular negative cardio ROS  Rhythm:Regular     Neuro/Psych  Headaches PSYCHIATRIC DISORDERS Anxiety Depression       GI/Hepatic Neg liver ROS,GERD  Medicated and Controlled,,  Endo/Other    Morbid obesityBMI 45  Renal/GU negative Renal ROS     Musculoskeletal  (+) Arthritis , Rheumatoid disorders,  Ankylosing spondylitis    Abdominal   Peds  Hematology Lab Results      Component                Value               Date                      WBC                      6.1                 07/22/2022                HGB                      12.9                07/22/2022                HCT                      40.5                07/22/2022                MCV                      89.8                07/22/2022                PLT                      304                 07/22/2022              Anesthesia Other Findings   Reproductive/Obstetrics S/p hysterectomy                              Anesthesia Physical Anesthesia Plan  ASA: 3  Anesthesia Plan: General and Regional   Post-op Pain Management: Regional block*, Tylenol PO (pre-op)* and Gabapentin PO (pre-op)*   Induction: Intravenous  PONV Risk Score and Plan: 3 and Ondansetron, Dexamethasone, Midazolam and Treatment may vary due to age or medical condition  Airway Management Planned: Oral  ETT  Additional Equipment: None  Intra-op Plan:   Post-operative Plan: Extubation in OR  Informed Consent: I have reviewed the patients History and Physical, chart, labs and discussed the procedure including the risks, benefits and alternatives for the proposed anesthesia with the patient or authorized representative who has indicated his/her understanding and acceptance.     Dental advisory given  Plan Discussed with: CRNA  Anesthesia Plan Comments:         Anesthesia Quick Evaluation

## 2022-07-22 NOTE — Anesthesia Procedure Notes (Signed)
Anesthesia Regional Block: Interscalene brachial plexus block   Pre-Anesthetic Checklist: , timeout performed,  Correct Patient, Correct Site, Correct Laterality,  Correct Procedure, Correct Position, site marked,  Risks and benefits discussed,  Surgical consent,  Pre-op evaluation,  At surgeon's request and post-op pain management  Laterality: Right and Upper  Prep: chloraprep       Needles:  Injection technique: Single-shot      Needle Length: 5cm  Needle Gauge: 22     Additional Needles: Arrow StimuQuik ECHO Echogenic Stimulating PNB Needle  Procedures:,,,, ultrasound used (permanent image in chart),,    Narrative:  Start time: 07/22/2022 12:06 PM End time: 07/22/2022 12:11 PM Injection made incrementally with aspirations every 5 mL.  Performed by: Personally  Anesthesiologist: Oleta Mouse, MD

## 2022-07-22 NOTE — Transfer of Care (Signed)
Immediate Anesthesia Transfer of Care Note  Patient: Anna Keller  Procedure(s) Performed: RIGHT SHOULDER REVISION  ARTHROPLASTY (Right: Shoulder)  Patient Location: PACU  Anesthesia Type:General  Level of Consciousness: awake, alert , and oriented  Airway & Oxygen Therapy: Patient Spontanous Breathing and Patient connected to nasal cannula oxygen  Post-op Assessment: Report given to RN and Post -op Vital signs reviewed and stable  Post vital signs: Reviewed and stable  Last Vitals:  Vitals Value Taken Time  BP 129/93 07/22/22 1553  Temp 36.7 C 07/22/22 1553  Pulse 99 07/22/22 1558  Resp 18 07/22/22 1558  SpO2 95 % 07/22/22 1558  Vitals shown include unvalidated device data.  Last Pain:  Vitals:   07/22/22 1553  TempSrc:   PainSc: 0-No pain      Patients Stated Pain Goal: 5 ([id number])  Complications: No notable events documented.

## 2022-07-22 NOTE — Op Note (Signed)
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  Inflammatory labs were negative..  The risk and benefits of the procedure were discussed in detail and documented in the pre-operative evaluation.   PREOPERATIVE DIAGNOSIS: 1.  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  PROCEDURE: 1.  Right revision glenoid reverse shoulder arthroplasty with poly exchange  SURGEON: Yevonne Pax MD  ASSISTANT: Raynelle Fanning, ATC  ANESTHESIA:  general plus interscalene nerve block  IV FLUIDS AND URINE: See anesthesia record.  ANTIBIOTICS: Ancef plus vancomycin  ESTIMATED BLOOD LOSS: 50 mL.  IMPLANTS:  Implant Name Type Inv. Item Serial No. Manufacturer Lot No. LRB No. Used Action  AUG BASEPLATE 15DEG 25 WEDGE - [id number] Joint AUG BASEPLATE 15DEG 25 WEDGE [id number] [employer]  Right 1 Explanted  INSERT CUP HUM RET 1/2 39 +3 - [id number] Insert INSERT CUP HUM RET 1/2 39 +3 [id number] [employer]  Right 1 Explanted  SCREW 5.0X18 - [id number] Screw SCREW 5.0X18  [employer]  Right 1 Explanted  SCREW PERIPHERAL 30 - [id number] Screw SCREW PERIPHERAL 30  [employer]  Right 3 Explanted  AUG BASEPLATE 15DEG 25 WEDGE - [id number] Joint AUG BASEPLATE 15DEG 25 WEDGE [id number] [employer]  Right 1 Implanted  ANCHOR SOFT DL #2 1.5 - [id number] Anchor ANCHOR SOFT DL #2 1.5  [employer](ORTH,TRAU,BIO,SG) [id number] Right 1 Implanted  GLENOSPHERE STANDARD 39 - [id number] Joint GLENOSPHERE STANDARD 39 [id number] [employer]  Right 1 Explanted  GLENOSPHERE CANN +3 - [id number] Orthopedic Implant GLENOSPHERE CANN +3 [id number] [employer]  Right 1 Implanted  SCREW POST THREADED 9.5X35 - [id number] Screw SCREW POST THREADED 9.5X35  [employer]  Right 1 Implanted  SCREW 5.0X18 - [id number] Screw SCREW 5.0X18  [employer]  Right 1  Implanted  SCREW PERIPHERAL 30 - [id number] Screw SCREW PERIPHERAL 30  [employer]  Right 3 Implanted  INSERT REVS RETENTIVE SZ 1/2 - [id number] Orthopedic Implant INSERT REVS RETENTIVE SZ 1/2 [id number] [employer]  Right 1 Implanted  ANCHOR SUT QUATTRO KNTLS 4.5 - [id number] Anchor ANCHOR SUT QUATTRO KNTLS 4.5  [employer](ORTH,TRAU,BIO,SG) [id number] Right 1 Implanted  ANCHOR SUT QUATTRO KNTLS 4.5 - [id number] Anchor ANCHOR SUT QUATTRO KNTLS 4.5  [employer](ORTH,TRAU,BIO,SG) [id number] Right 1 Implanted    DRAINS: None  CULTURES: None  COMPLICATIONS: none  DESCRIPTION OF PROCEDURE:  DRAINS: None   CULTURES: None   COMPLICATIONS: none   DESCRIPTION OF PROCEDURE:  Patient was identified in the preoperative holding area.  Anesthesia performed an interscalene nerve block after universal timeout was performed with nursing.  Ancef and vancomycin was given 1 hour prior to skin incision.    The surgical site was scrubbed with a chlorhexidine scrub brush and alcohol.  The patient was then prepped with chlorhexidine skin prep.  The patient was subsequently taken back to the operating room.  Anesthesia was induced.  He was transferred to the beachchair position.  All bony prominences were padded.  Final timeout was again performed.      The bony landmarks of the shoulder were marked with a marking pen. A delto-pectoral incision was made, extending up approximately 5 inches. The wound with then irrigated with dilute betadine. Cephalic vein was identified, and an protected. This was retracted medially.Subdeltoid and subpectoral lesions were released. Neurovascular structures were carefully protected. The Gelpi retractor was used to retract the deltoid and  pectoralis major. A 1 cm release was performed on the upper pectoralis.   The deltoid was retracted laterally with a Brown humeral retractor.  The conjoined tendon was identified. The cleido-pectoral fascia was excised.  Once this was completed the  dislocated prosthesis was encountered and a circumferential release was performed around the neck of the humerus.  Tissue planes were altered as result of the previous surgery so care was taken at all times to respect the tissue planes.  Once the joint was exposed the proximal humerus was delivered with external rotation and extension of the arm.  The polyethylene was removed by drilling the central hole and then placing a screw until the polyethylene component popped free.  At this time cultures were taken from the humeral neck as well as from the glenoid and from the membrane around the capsule of the shoulder.  Antibiotics were given following this.  There was no evidence of gross infection.    Attention was then turned to the glenoid.  Posteriorly a large Darach retractor was used.  A 360 Degree release of the subscapularis and glenoid were done. Glenoid retractors were placed posteriorly, superiorly behind the biceps tendon and anteriorly on the glenoid neck.  The baseplate and glenosphere were both loose.  As result the glenosphere screw was removed and the benefits care was taken off.  The baseplate was freely mobile and removed with a Coker.  A 360-degree release of the capsule was performed with cautery.  The triceps was released off the inferior tubercle of the glenoid. The axillary nerve was carefully protected with the surgeon's index finger, retracting it and using cautery.  A new baseplate full wedge augment was then placed by placing the 25 degrees cephalad guide.  Sensor wire was put down the previous aspect of the previous baseplate.  The central peg was then drilled and reamed.  A large 9 5 central screw was then drilled and a size 35 screw was selected.  This was impacted into place with excellent purchase and screws with excellent purchase.  A series of 4 peripheral screws were then placed all with excellent purchase in the glenoid.  A size 39 lateralized glenosphere was then impacted into  place and secured with good purchase.   At this time, the humerus was then redelivered and a series of polyethylene trials were then trialed and the size 0 constrained liner had the best stability.  As result the final polyethylene was chosen and this was reduced into place.  Appropriate tension was noted on the conjoined tendon and deltoid muscle. Extension was stable, external and internal rotation as well.  The subscapularis was not able to be repaired.  The wound was then irrigated. Vancomycin powder was placed in the wound again for infection prevention.  At this time the suture limbs were brought anterior and posterior on the lateral humerus and a double row type configuration into two Quatro lateral row anchors.  These all had excellent purchase and created a nidus of suture over the superior portion of the prosthesis to prevent dislocation.   The wound was then closed in layers with 0 Vicryl interrupted in the deep subcu followed by 2-0 Vicryl in the superficial subcu and staples for skin. Xeroform, guaze, ABD and metapore tape were placed.  A shoulder immobilizer was applied.         POSTOPERATIVE PLAN: She will be strict nonweightbearing for 2 weeks in a sling.  I will plan to see her back for wound check  in 2 weeks.  She will begin physical therapy again postoperatively.  I will plan to follow his cultures outpatient for 7 days total.     Yevonne Pax, MD 3:36 PM

## 2022-07-22 NOTE — Brief Op Note (Signed)
   Brief Op Note  Date of Surgery: 07/22/2022  Preoperative Diagnosis: pain in right shoulder  Postoperative Diagnosis: same  Procedure: Procedure(s): RIGHT SHOULDER REVISION  ARTHROPLASTY  Implants: Implant Name Type Inv. Item Serial No. Manufacturer Lot No. LRB No. Used Action  AUG BASEPLATE 15DEG 25 WEDGE - [id number] Joint AUG BASEPLATE 15DEG 25 WEDGE [id number] [employer]  Right 1 Explanted  INSERT CUP HUM RET 1/2 39 +3 - [id number] Insert INSERT CUP HUM RET 1/2 39 +3 [id number] [employer]  Right 1 Explanted  SCREW 5.0X18 - [id number] Screw SCREW 5.0X18  [employer]  Right 1 Explanted  SCREW PERIPHERAL 30 - [id number] Screw SCREW PERIPHERAL 30  [employer]  Right 3 Explanted  AUG BASEPLATE 15DEG 25 WEDGE - [id number] Joint AUG BASEPLATE 15DEG 25 WEDGE [id number] [employer]  Right 1 Implanted  ANCHOR SOFT DL #2 1.5 - [id number] Anchor ANCHOR SOFT DL #2 1.5  [employer](ORTH,TRAU,BIO,SG) [id number] Right 1 Implanted  GLENOSPHERE STANDARD 39 - [id number] Joint GLENOSPHERE STANDARD 39 [id number] [employer]  Right 1 Explanted  GLENOSPHERE CANN +3 - [id number] Orthopedic Implant GLENOSPHERE CANN +3 [id number] [employer]  Right 1 Implanted  SCREW POST THREADED 9.5X35 - [id number] Screw SCREW POST THREADED 9.5X35  [employer]  Right 1 Implanted  SCREW 5.0X18 - [id number] Screw SCREW 5.0X18  [employer]  Right 1 Implanted  SCREW PERIPHERAL 30 - [id number] Screw SCREW PERIPHERAL 30  [employer]  Right 3 Implanted  INSERT REVS RETENTIVE SZ 1/2 - [id number] Orthopedic Implant INSERT REVS RETENTIVE SZ 1/2 [id number] [employer]  Right 1 Implanted  ANCHOR SUT QUATTRO KNTLS 4.5 - [id number] Anchor ANCHOR SUT QUATTRO KNTLS 4.5  [employer](ORTH,TRAU,BIO,SG) [id number] Right 1 Implanted  ANCHOR SUT QUATTRO KNTLS 4.5 - [id number] Anchor ANCHOR SUT QUATTRO KNTLS 4.5  [employer](ORTH,TRAU,BIO,SG) [id number] Right 1 Implanted    Surgeons: Surgeon(s): Vanetta Mulders,  MD  Anesthesia: General    Estimated Blood Loss: See anesthesia record  Complications: None  Condition to PACU: Stable  Yevonne Pax, MD 07/22/2022 3:35 PM

## 2022-07-22 NOTE — Discharge Instructions (Signed)
     Discharge Instructions    Attending Surgeon: Vanetta Mulders, MD Office Phone Number: [phone]   Diagnosis and Procedures:    Surgeries Performed: Revision right shoulder arthroplasty  Discharge Plan:    Diet: Resume usual diet. Begin with light or bland foods.  Drink plenty of fluids.  Activity:  Non weight bearing until PT begins. You are advised to go home directly from the hospital or surgical center. Restrict your activities.  GENERAL INSTRUCTIONS: 1.  Please apply ice to your wound to help with swelling and inflammation. This will improve your comfort and your overall recovery following surgery.     2. Please call Dr. Eddie Dibbles office at [phone] with questions Monday-Friday during business hours. If no one answers, please leave a message and someone should get back to the patient within 24 hours. For emergencies please call 911 or proceed to the emergency room.   3. Patient to notify surgical team if experiences any of the following: Bowel/Bladder dysfunction, uncontrolled pain, nerve/muscle weakness, incision with increased drainage or redness, nausea/vomiting and Fever greater than 101.0 F.  Be alert for signs of infection including redness, streaking, odor, fever or chills. Be alert for excessive pain or bleeding and notify your surgeon immediately.  WOUND INSTRUCTIONS:   Leave your dressing, cast, or splint in place until your post operative visit.  Keep it clean and dry.  Always keep the incision clean and dry until the staples/sutures are removed. If there is no drainage from the incision you should keep it open to air. If there is drainage from the incision you must keep it covered at all times until the drainage stops  Do not soak in a bath tub, hot tub, pool, lake or other body of water until 21 days after your surgery and your incision is completely dry and healed.  If you have removable sutures (or staples) they must be removed 10-14 days (unless  otherwise instructed) from the day of your surgery.     1)  Elevate the extremity as much as possible.  2)  Keep the dressing clean and dry.  3)  Please call us if the dressing becomes wet or dirty.  4)  If you are experiencing worsening pain or worsening swelling, please call.     MEDICATIONS: Resume all previous home medications at the previous prescribed dose and frequency unless otherwise noted Start taking the  pain medications on an as-needed basis as prescribed  Please taper down pain medication over the next week following surgery.  Ideally you should not require a refill of any narcotic pain medication.  Take pain medication with food to minimize nausea. In addition to the prescribed pain medication, you may take over-the-counter pain relievers such as Tylenol.  Do NOT take additional tylenol if your pain medication already has tylenol in it.  Aspirin 325mg  daily for four weeks.      FOLLOWUP INSTRUCTIONS: 1. Follow up at the Physical Therapy Clinic 3-4 days following surgery. This appointment should be scheduled unless other arrangements have been made.The Physical Therapy scheduling number is [phone] if an appointment has not already been arranged.  2. Contact Dr. Eddie Dibbles office during office hours at [phone] or the practice after hours line at [phone] for non-emergencies. For medical emergencies call 911.   Discharge Location: Home

## 2022-07-23 NOTE — Anesthesia Postprocedure Evaluation (Signed)
Anesthesia Post Note  Patient: Anna Keller  Procedure(s) Performed: RIGHT SHOULDER REVISION  ARTHROPLASTY (Right: Shoulder)     Patient location during evaluation: PACU Anesthesia Type: General Level of consciousness: awake and alert Pain management: pain level controlled Vital Signs Assessment: post-procedure vital signs reviewed and stable Respiratory status: spontaneous breathing, nonlabored ventilation, respiratory function stable and patient connected to nasal cannula oxygen Cardiovascular status: blood pressure returned to baseline and stable Postop Assessment: no apparent nausea or vomiting Anesthetic complications: no   No notable events documented.  Last Vitals:  Vitals:   07/22/22 1608 07/22/22 1623  BP: 123/76 94/65  Pulse: (!) 102 (!) 102  Resp: 18 18  Temp:  36.7 C  SpO2: 93% 93%    Last Pain:  Vitals:   07/22/22 1553  TempSrc:   PainSc: 0-No pain                 Tiajuana Amass

## 2022-07-24 ENCOUNTER — Other Ambulatory Visit: Payer: Self-pay | Admitting: Nurse Practitioner

## 2022-07-24 ENCOUNTER — Encounter (HOSPITAL_BASED_OUTPATIENT_CLINIC_OR_DEPARTMENT_OTHER): Payer: Self-pay | Admitting: Orthopaedic Surgery

## 2022-07-24 DIAGNOSIS — F988 Other specified behavioral and emotional disorders with onset usually occurring in childhood and adolescence: Secondary | ICD-10-CM

## 2022-07-24 DIAGNOSIS — G8929 Other chronic pain: Secondary | ICD-10-CM

## 2022-07-24 MED ORDER — TRAMADOL HCL 50 MG PO TABS: 100.0000 mg | ORAL_TABLET | Freq: Two times a day (BID) | ORAL | 2 refills | Status: DC | PRN

## 2022-07-24 MED ORDER — AMPHETAMINE-DEXTROAMPHETAMINE 20 MG PO TABS: 20.0000 mg | ORAL_TABLET | Freq: Two times a day (BID) | ORAL | 0 refills | Status: DC

## 2022-07-26 ENCOUNTER — Encounter (HOSPITAL_COMMUNITY): Payer: Self-pay | Admitting: Orthopaedic Surgery

## 2022-07-26 ENCOUNTER — Other Ambulatory Visit (HOSPITAL_BASED_OUTPATIENT_CLINIC_OR_DEPARTMENT_OTHER): Payer: Self-pay | Admitting: Orthopaedic Surgery

## 2022-07-26 ENCOUNTER — Encounter (HOSPITAL_BASED_OUTPATIENT_CLINIC_OR_DEPARTMENT_OTHER): Payer: Self-pay | Admitting: Orthopaedic Surgery

## 2022-07-26 ENCOUNTER — Encounter (HOSPITAL_BASED_OUTPATIENT_CLINIC_OR_DEPARTMENT_OTHER): Payer: 59 | Admitting: Physical Therapy

## 2022-07-26 ENCOUNTER — Ambulatory Visit: Payer: 59 | Admitting: Physical Therapy

## 2022-07-26 DIAGNOSIS — Z9889 Other specified postprocedural states: Secondary | ICD-10-CM

## 2022-07-27 ENCOUNTER — Encounter (HOSPITAL_COMMUNITY): Payer: Self-pay | Admitting: Orthopaedic Surgery

## 2022-07-27 LAB — AEROBIC/ANAEROBIC CULTURE W GRAM STAIN (SURGICAL/DEEP WOUND)
Culture: NO GROWTH
Culture: NO GROWTH
Gram Stain: NONE SEEN
Gram Stain: NONE SEEN

## 2022-07-28 ENCOUNTER — Ambulatory Visit: Payer: 59 | Admitting: Physical Therapy

## 2022-07-29 ENCOUNTER — Encounter (HOSPITAL_BASED_OUTPATIENT_CLINIC_OR_DEPARTMENT_OTHER): Payer: 59 | Admitting: Physical Therapy

## 2022-07-29 ENCOUNTER — Encounter: Payer: Self-pay | Admitting: Student in an Organized Health Care Education/Training Program

## 2022-08-02 ENCOUNTER — Ambulatory Visit: Payer: 59 | Admitting: Physical Therapy

## 2022-08-02 ENCOUNTER — Encounter: Payer: Self-pay | Admitting: Nurse Practitioner

## 2022-08-04 ENCOUNTER — Encounter: Payer: 59 | Admitting: Physical Therapy

## 2022-08-04 ENCOUNTER — Ambulatory Visit (INDEPENDENT_AMBULATORY_CARE_PROVIDER_SITE_OTHER): Payer: 59 | Admitting: Orthopaedic Surgery

## 2022-08-04 ENCOUNTER — Other Ambulatory Visit: Payer: Self-pay | Admitting: Nurse Practitioner

## 2022-08-04 ENCOUNTER — Ambulatory Visit (INDEPENDENT_AMBULATORY_CARE_PROVIDER_SITE_OTHER): Payer: 59

## 2022-08-04 DIAGNOSIS — Z96611 Presence of right artificial shoulder joint: Secondary | ICD-10-CM | POA: Diagnosis not present

## 2022-08-04 DIAGNOSIS — Z471 Aftercare following joint replacement surgery: Secondary | ICD-10-CM | POA: Diagnosis not present

## 2022-08-04 NOTE — Progress Notes (Signed)
Post Operative Evaluation    Procedure/Date of Surgery: Right shoulder revision shoulder arthroplasty 3/21  Interval History:   Anna Keller presents today for follow-up of her right shoulder revision shoulder arthroplasty.  She has been having pain about the deltoid.  She is having a hard time mobilizing physical therapy as she feels like she is having a charley horse in the right shoulder.  Denies any redness currently there is no drainage from the wound.  Denies any numbness or weakness in the right arm or hand.   PMH/PSH/Family History/Social History/Meds/Allergies:    Past Medical History:  Diagnosis Date   ADHD (attention deficit hyperactivity disorder)    Ankylosing spondylitis ([place])    Anxiety    Arthritis    Depression    Eczema    Family history of adverse reaction to anesthesia    dad would be crazy, talk out of his head-per pt   Headache    Rheumatoid arthritis ([place])    Past Surgical History:  Procedure Laterality Date   BLADDER SUSPENSION N/A 04/14/2021   Procedure: TRANSVAGINAL TAPE (TVT) PROCEDURE;  Surgeon: Sanjuana Kava, MD;  Location: [place];  Service: Gynecology;  Laterality: N/A;  GYNCARE TVT EXACT   CYSTOSCOPY N/A 04/14/2021   Procedure: CYSTOSCOPY;  Surgeon: Sanjuana Kava, MD;  Location: [place];  Service: Gynecology;  Laterality: N/A;   KNEE ARTHROSCOPY Left 1988   REVERSE SHOULDER ARTHROPLASTY Right 01/05/2022   Procedure: RIGHT SHOULDER REVERSE SHOULDER ARTHROPLASTY;  Surgeon: Vanetta Mulders, MD;  Location: [place];  Service: Orthopedics;  Laterality: Right;   REVERSE SHOULDER ARTHROPLASTY Right 04/01/2022   Procedure: RIGHT REVERSE SHOULDER ARTHROPLASTY REVISION;  Surgeon: Vanetta Mulders, MD;  Location: [place];  Service: Orthopedics;  Laterality: Right;   REVERSE SHOULDER ARTHROPLASTY Right 07/22/2022   Procedure: RIGHT SHOULDER REVISION  ARTHROPLASTY;  Surgeon: Vanetta Mulders, MD;  Location: [place];  Service: Orthopedics;   Laterality: Right;   TONSILLECTOMY     as a child   TOTAL LAPAROSCOPIC HYSTERECTOMY WITH SALPINGECTOMY Bilateral 04/14/2021   Procedure: TOTAL LAPAROSCOPIC HYSTERECTOMY WITH BILATERAL SALPINGECTOMY AND BILATERAL OOPHERECTOMY;  Surgeon: Sanjuana Kava, MD;  Location: [place];  Service: Gynecology;  Laterality: Bilateral;   Social History   Socioeconomic History   Marital status: Married    Spouse name: Legrand Como   Number of children: 1   Years of education: 12   Highest education level: High school graduate  Occupational History   Not on file  Tobacco Use   Smoking status: Former    Packs/day: 1.00    Years: 25.00    Additional pack years: 0.00    Total pack years: 25.00    Types: Cigarettes    Quit date: 01/09/2016    Years since quitting: 6.5   Smokeless tobacco: Never  Vaping Use   Vaping Use: Never used  Substance and Sexual Activity   Alcohol use: Not Currently   Drug use: No   Sexual activity: Yes    Partners: Male    Birth control/protection: None  Other Topics Concern   Not on file  Social History Narrative   Not on file   Social Determinants of Health   Financial Resource Strain: Low Risk  (07/14/2018)   Overall Financial Resource Strain (CARDIA)    Difficulty of Paying Living Expenses: Not hard at all  Food Insecurity: No Food Insecurity (07/14/2018)   Hunger Vital Sign    Worried About Running Out of Food in the Last Year: Never true    Ran Out of Food in the Last Year: Never true  Transportation Needs: No Transportation Needs (04/02/2022)   PRAPARE - Hydrologist (Medical): No    Lack of Transportation (Non-Medical): No  Physical Activity: Inactive (07/14/2018)   Exercise Vital Sign    Days of Exercise per Week: 0 days    Minutes of Exercise per Session: 0 min  Stress: No Stress Concern Present (07/14/2018)   [place]    Feeling of Stress : Only a little  Social  Connections: Somewhat Isolated (07/14/2018)   Social Connection and Isolation Panel [NHANES]    Frequency of Communication with Friends and Family: More than three times a week    Frequency of Social Gatherings with Friends and Family: More than three times a week    Attends Religious Services: Never    Marine scientist or Organizations: No    Attends Music therapist: Never    Marital Status: Married   Family History  Problem Relation Age of Onset   Hypertension Mother    Arthritis Father    Heart disease Sister    Pulmonary Hypertension Sister    COPD Sister    Diabetes Sister    Breast cancer Maternal Grandmother    Heart attack Paternal Grandmother    Breast cancer Maternal Aunt    Breast cancer Cousin    No Known Allergies Current Outpatient Medications  Medication Sig Dispense Refill   ALPRAZolam (XANAX) 1 MG tablet Take 1 tablet (1 mg total) by mouth 3 (three) times daily as needed for anxiety. 90 tablet 1   amphetamine-dextroamphetamine (ADDERALL) 20 MG tablet Take 1 tablet (20 mg total) by mouth 2 (two) times daily. 30 tablet 0   aspirin EC 325 MG tablet Take 1 tablet (325 mg total) by mouth daily. 30 tablet 0   Calcium Carb-Cholecalciferol (CALCIUM 600+D3 PO) Take 1 tablet by mouth 3 (three) times daily.     cholecalciferol (VITAMIN D3) 25 MCG (1000 UNIT) tablet Take 1,000 Units by mouth daily.     diclofenac (VOLTAREN) 50 MG EC tablet Take 50 mg by mouth 2 (two) times daily.     HUMIRA, 2 PEN, 40 MG/0.8ML PNKT Inject 40 mg into the skin every 14 (fourteen) days.     levocetirizine (XYZAL) 5 MG tablet Take 5 mg by mouth every evening.     Multiple Vitamins-Minerals (MULTIVITAMIN WITH MINERALS) tablet Take 1 tablet by mouth daily.     omeprazole (PRILOSEC) 20 MG capsule Take 1 capsule by mouth once daily 90 capsule 0   polyethylene glycol powder (GLYCOLAX/MIRALAX) 17 GM/SCOOP powder Take 17 g by mouth daily. (Patient taking differently: Take 17 g by mouth  daily as needed for mild constipation or moderate constipation.) 3350 g 3   rosuvastatin (CRESTOR) 40 MG tablet Take 40 mg by mouth every evening.     traMADol (ULTRAM) 50 MG tablet Take 2 tablets (100 mg total) by mouth every 12 (twelve) hours as needed. 30 tablet 2   venlafaxine XR (EFFEXOR-XR) 150 MG 24 hr capsule Take 1 capsule (150 mg total) by mouth daily. (Patient taking differently: Take 300 mg by mouth daily with breakfast.) 60 capsule 6   No current facility-administered medications for this visit.   No results  found.  Review of Systems:   A ROS was performed including pertinent positives and negatives as documented in the HPI.   Musculoskeletal Exam:     Right shoulder incision is well-appearing without erythema or drainage.  She is able to flex and extend at the right elbow and right wrist.  In the supine position I am able to forward elevate her to approximately 45 degrees although this is limited due to pain.  2+ radial pulse.  Sensation is intact in the musculocutaneous ulnar median radial distributions.  Fires all 3 heads of the deltoid.  Sensation is intact and equal about both deltoid and the axillary distribution  Imaging:    X-ray right shoulder 3 views: Status post revision shoulder arthroplasty complication  I personally reviewed and interpreted the radiographs.   Assessment:   2-week status post right shoulder revision shoulder arthroplasty without evidence of complication.  At today's visit I would like her to continue to work through physical therapy.  Fortunately I did discuss that I do believe there may be a component of fibromyalgia or complex pain that is hampering her ability to actively move the shoulder.  Cultures remain negative.  She does have a fixed implant now that should promote stable elevation.  She does not have any evidence of an axillary nerve palsy on my exam today.  I would like her to continue to work through physical therapy to work on range  of motion.  We have connected her with the pain clinic as well I would like to help work on her pain control through them.  Plan :    -Return to clinic in 4 weeks      I personally saw and evaluated the patient, and participated in the management and treatment plan.  Vanetta Mulders, MD Attending Physician, Orthopedic Surgery  This document was dictated using Dragon voice recognition software. A reasonable attempt at proof reading has been made to minimize errors.

## 2022-08-05 ENCOUNTER — Other Ambulatory Visit: Payer: Self-pay | Admitting: Nurse Practitioner

## 2022-08-05 MED ORDER — FLUOXETINE HCL 20 MG PO CAPS: 20.0000 mg | ORAL_CAPSULE | Freq: Every day | ORAL | 2 refills | Status: DC

## 2022-08-06 ENCOUNTER — Encounter: Payer: Self-pay | Admitting: Nurse Practitioner

## 2022-08-06 ENCOUNTER — Other Ambulatory Visit: Payer: Self-pay | Admitting: Nurse Practitioner

## 2022-08-06 DIAGNOSIS — F32A Depression, unspecified: Secondary | ICD-10-CM

## 2022-08-06 MED ORDER — FLUOXETINE HCL 40 MG PO CAPS: 40.0000 mg | ORAL_CAPSULE | Freq: Every day | ORAL | 1 refills | Status: DC

## 2022-08-09 ENCOUNTER — Encounter: Payer: 59 | Admitting: Physical Therapy

## 2022-08-10 ENCOUNTER — Encounter: Payer: Self-pay | Admitting: Nurse Practitioner

## 2022-08-10 ENCOUNTER — Other Ambulatory Visit: Payer: Self-pay | Admitting: Nurse Practitioner

## 2022-08-10 DIAGNOSIS — F988 Other specified behavioral and emotional disorders with onset usually occurring in childhood and adolescence: Secondary | ICD-10-CM

## 2022-08-10 MED ORDER — AMPHETAMINE-DEXTROAMPHETAMINE 20 MG PO TABS: 20.0000 mg | ORAL_TABLET | Freq: Two times a day (BID) | ORAL | 0 refills | Status: DC

## 2022-08-11 ENCOUNTER — Encounter: Payer: 59 | Admitting: Physical Therapy

## 2022-08-12 ENCOUNTER — Other Ambulatory Visit: Payer: Self-pay | Admitting: Nurse Practitioner

## 2022-08-12 DIAGNOSIS — F988 Other specified behavioral and emotional disorders with onset usually occurring in childhood and adolescence: Secondary | ICD-10-CM

## 2022-08-12 MED ORDER — AMPHETAMINE-DEXTROAMPHETAMINE 20 MG PO TABS: 20.0000 mg | ORAL_TABLET | Freq: Two times a day (BID) | ORAL | 0 refills | Status: DC

## 2022-08-15 ENCOUNTER — Other Ambulatory Visit: Payer: Self-pay | Admitting: Nurse Practitioner

## 2022-08-15 DIAGNOSIS — G8929 Other chronic pain: Secondary | ICD-10-CM

## 2022-08-16 ENCOUNTER — Ambulatory Visit: Payer: 59 | Attending: Orthopaedic Surgery | Admitting: Physical Therapy

## 2022-08-16 DIAGNOSIS — M25611 Stiffness of right shoulder, not elsewhere classified: Secondary | ICD-10-CM | POA: Insufficient documentation

## 2022-08-16 DIAGNOSIS — M25511 Pain in right shoulder: Secondary | ICD-10-CM | POA: Diagnosis not present

## 2022-08-16 DIAGNOSIS — S46011A Strain of muscle(s) and tendon(s) of the rotator cuff of right shoulder, initial encounter: Secondary | ICD-10-CM | POA: Diagnosis not present

## 2022-08-16 NOTE — Therapy (Unsigned)
OUTPATIENT PHYSICAL THERAPY SHOULDER RE-EVAL / TREATMENT/ RE-CERT    Patient Name: Anna Keller MRN: [id number] DOB:1970-08-16, 52 y.o., female Today's Date: 08/17/2022   PT End of Session - 08/16/22 1355     Visit Number 14    Number of Visits 38    Date for PT Re-Evaluation 11/08/22    Authorization Type [employer]    Progress Note Due on Visit 20    PT Start Time 1354    PT Stop Time 1430    PT Time Calculation (min) 36 min    Activity Tolerance Patient limited by pain    Behavior During Therapy [facility] for tasks assessed/performed;Anxious;Agitated                     Past Medical History:  Diagnosis Date   ADHD (attention deficit hyperactivity disorder)    Ankylosing spondylitis    Anxiety    Arthritis    Depression    Eczema    Family history of adverse reaction to anesthesia    dad would be crazy, talk out of his head-per pt   Headache    Rheumatoid arthritis    Past Surgical History:  Procedure Laterality Date   BLADDER SUSPENSION N/A 04/14/2021   Procedure: TRANSVAGINAL TAPE (TVT) PROCEDURE;  Surgeon: Essie Hart, MD;  Location: [facility] OR;  Service: Gynecology;  Laterality: N/A;  GYNCARE TVT EXACT   CYSTOSCOPY N/A 04/14/2021   Procedure: CYSTOSCOPY;  Surgeon: Essie Hart, MD;  Location: [facility] OR;  Service: Gynecology;  Laterality: N/A;   KNEE ARTHROSCOPY Left 1988   REVERSE SHOULDER ARTHROPLASTY Right 01/05/2022   Procedure: RIGHT SHOULDER REVERSE SHOULDER ARTHROPLASTY;  Surgeon: Huel Cote, MD;  Location: [facility] OR;  Service: Orthopedics;  Laterality: Right;   REVERSE SHOULDER ARTHROPLASTY Right 04/01/2022   Procedure: RIGHT REVERSE SHOULDER ARTHROPLASTY REVISION;  Surgeon: Huel Cote, MD;  Location: [facility] OR;  Service: Orthopedics;  Laterality: Right;   REVERSE SHOULDER ARTHROPLASTY Right 07/22/2022   Procedure: RIGHT SHOULDER REVISION  ARTHROPLASTY;  Surgeon: Huel Cote, MD;  Location: [facility];  Service: Orthopedics;  Laterality: Right;    TONSILLECTOMY     as a child   TOTAL LAPAROSCOPIC HYSTERECTOMY WITH SALPINGECTOMY Bilateral 04/14/2021   Procedure: TOTAL LAPAROSCOPIC HYSTERECTOMY WITH BILATERAL SALPINGECTOMY AND BILATERAL OOPHERECTOMY;  Surgeon: Essie Hart, MD;  Location: [facility];  Service: Gynecology;  Laterality: Bilateral;   Patient Active Problem List   Diagnosis Date Noted   Joint inflammation 06/10/2022   Obesity (BMI 35.0-39.9 without comorbidity) 06/10/2022   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 05/05/2022   History of reverse total replacement of right shoulder joint 04/01/2022   Unilateral primary osteoarthritis, left knee 01/27/2022   Traumatic complete tear of right rotator cuff    S/P laparoscopic hysterectomy 04/14/2021   Grief reaction 03/23/2019   Gastroesophageal reflux disease without esophagitis 03/23/2019   Constipation 03/23/2019   Eczema 03/23/2019   Pain in joint, multiple sites 07/14/2018   Family history of arthritis 07/14/2018   Peeling skin 07/14/2018   Class 3 severe obesity due to excess calories with body mass index (BMI) of 40.0 to 44.9 in adult 07/14/2018   Anxiety 07/14/2018   Depression 07/14/2018     REFERRING PROVIDER: Huel Cote, MD  REFERRING DIAG: S46.011A (ICD-10-CM) - Traumatic complete tear of right rotator cuff, initial encounter Right R.TSA with biceps tenodesis, NO subscap repair  THERAPY DIAG:  Acute pain of right shoulder  Stiffness of right shoulder, not elsewhere classified  Traumatic  complete tear of right rotator cuff, initial encounter  Rationale for Evaluation and Treatment Rehabilitation  ONSET DATE: DOS initial rTSA 01/05/22       DOS revision 04/01/22   DOS revision 2: 07/22/22 - replacement of broken screws and another revision   SUBJECTIVE:                                                                                                                                                                                       SUBJECTIVE STATEMENT: Pt reports she had another shoulder surgery (her third) replacement of the shoulder. Pt reports she has had a lot of trouble with pain management and her shoulder over the past few weeks. Pt MD told her to take 2 weeks off form PT which is why she is here this date.   PERTINENT HISTORY:  Pt reports she has been in intense pain in her back, knees and shoulders. Pt sees MD next Wednesday for follow up. Pt reports that she lives in [place] and wanted to come to a clinic that is closer so she transitioned from [facility] to this clinic. Pt has not had PT in many weeks. Pt had 2 surgeries for R TSA with most recent one the Thursday after thanksgiving. Pt reports no falls since her previous visit but her knee continues to give out.  Patient reports continued intense pain in her right deltoid region.  Patient has high level of anxiety and reports this is why she did not attempt aquatic therapy and despite all of the benefits aquatic therapy could bring to her shoulder range of motion and pain.  Pt had third R TSA surgery on 07/22/22 RA  PAIN:  Are you having pain? Yes: NPRS scale: 10/10 Pain location: right shoulder Pain description: not a lot of pain, just sore, tight muscle pain Aggravating factors: constant Relieving factors: rest  PRECAUTIONS: Shoulder do not use ktape, has skin reaction/allergy   WEIGHT BEARING RESTRICTIONSnone on 2/5  FALLS:  Has patient fallen in last 6 months? Yes. 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  Patient  has been keeping right shoulder in abducted and internally rotated position.  Patient experiences extreme levels of pain and muscle guarding when  attempts for passive range of motion of the shoulder requires cues for slow pursed lip breathing in order to attempt to activate parasympathetic nervous system and diminish abnormal pain responses.  UPPER EXTREMITY MMT:  Not appropriate to test at this time, pt cannot move shoulder at this time actively    PALPATION:  04/08/22: Guarded, upper trap tension Plapable tightness through middle deltoid   TODAY'S TREATMENT:   Physical therapy treatment session today consisted of completing assessment of goals and administration of testing as demonstrated and documented in flow sheet, treatment, and goals section of this note. Addition treatments may be found below.    Treatment 08/16/22  Pt session was cut a little short today due to pt arriving a few minutes late to scheduled appointment time  Began treatment with unbilled 8 minutes of moist heat on the right shoulder ordered to improve blood flow to muscles for improved tolerance with passive range of motion.  Attempted passive range of motion of the shoulder and had patient thoroughly instructed and frequently instructed in pursed lip breathing focusing on the timing of her breaths and breathing out her mouth and in through her nose.  Patient instructed in importance of doing this to try to decrease her pain response to what should be a painless movement of passive range of motion.  Patient able to do this inconsistently but does complete by an valiant attempt.  Following this physical therapist performs 10 minutes of scar mobilization to scar that has now been cut for a third time.  Medical therapist also performs gentle soft tissue mobilization to right deltoid muscle with overall good tolerance.  Patient does express concerns regarding healing of scar if she has very extensive and extensive tattoo on  her right arm.   Treatment                            06/24/22:  Ashok Cordia for Lt patellar support IASTM Rt deltoid & biceps Pulleys flexoin & scaption Pulley to just above 90 deg with 3s activation hold Estim IFC with ice, attended 15 min    PATIENT EDUCATION: Education details: Anatomy of condition, POC, HEP, exercise form/rationale Person educated: Patient Education method: Explanation, Demonstration, Tactile cues, Verbal cues, and Handouts Education comprehension: verbalized understanding, returned demonstration, verbal cues required, tactile cues required, and needs further education   HOME EXERCISE PROGRAM: Begin visit 2   ASSESSMENT:  CLINICAL IMPRESSION:  Physical therapist reassessed this patient following second revision to right shoulder total shoulder arthroplasty.  Patient continues to demonstrate significantly high pain responses to passive range of motion and requires frequent cues for relaxation of target musculature in order to improve tolerance.  Patient's range of motion continues to be very poor both actively and passively at this time.  Patient did tolerate scar mobilization as well as gentle soft tissue massage to her right deltoid region this may be a good target at the beginning of future sessions followed by more attempts for passive range of motion of the shoulder or to prevent shoulder from becoming frozen and unable to move. Due to pt second revision rehab potential is poor to fair for return to normal function. Pt will continue to benefit from skilled physical therapy intervention to address impairments, improve QOL, and attain therapy goals.      OBJECTIVE IMPAIRMENTS decreased activity tolerance, decreased mobility, decreased ROM, decreased strength, increased edema, increased muscle spasms, impaired flexibility, impaired UE functional use, improper body mechanics, postural  dysfunction, and pain.   ACTIVITY LIMITATIONS carrying, lifting, transfers, bed  mobility, dressing, and hygiene/grooming  PARTICIPATION LIMITATIONS: meal prep, cleaning, laundry, driving, shopping, community activity, and occupation  PERSONAL FACTORS Fitness and 1-2 comorbidities: RA, obesity  are also affecting patient's functional outcome.   REHAB POTENTIAL: Fair second revision and reported muscle and nerve damage form physician. Physican would be "happy" with 90 degrees of elevation.   CLINICAL DECISION MAKING: Stable/uncomplicated  EVALUATION COMPLEXITY: Low   GOALS: Goals reviewed with patient? Yes   Short term Goals Date for completion 09/27/2022    Patient will be independent in home exercise program to improve strength/mobility for better functional independence with ADLs. Baseline: Patient not completing home exercises at this time since her second revision Goal status: INITIAL  Long Term Goals date for completion: 11/08/2022   1.  Patient will activate all heads of deltoid muscle including anterior lateral and posterior deltoids in order to improve active range of motion capability of the replaced right shoulder joint Baseline: Unable to activate heads of deltoid due to debilitating pain with any active movement of the shoulder.  Patient keep shoulder in protected position similar to that of which a sling would be an Goal status: INITIAL  2.  Patient will achieve 70 degrees or greater of active flexion and abduction in order to indicate improved functional mobility of the right shoulder Baseline: Patient unable to activate heads of deltoid to elevate shoulder and therefore is only able to keep shoulder in neutral position. Goal status: INITIAL  3.  Patient will achieve 90 degrees or greater of passive flexion and abduction of her right shoulder or to indicate improved potential for active range of motion and decreased pain with every day activities Baseline: unable to move past 5 degrees from flexion and abduction  Goal status: INITIAL  4.  Patient  will improve focus on therapeutic outcome survey score by 10 points or greater in order to indicate improved functional use of her right upper extremity Baseline: no FOTO setup on re-eval, will complete next session   Goal status: INITIAL    PLAN: PT FREQUENCY: 1-2x/week  PT DURATION: 8 weeks  PLANNED INTERVENTIONS: Therapeutic exercises, Therapeutic activity, Neuromuscular re-education, Patient/Family education, Self Care, Joint mobilization, Aquatic Therapy, Dry Needling, Electrical stimulation, Spinal mobilization, Cryotherapy, Moist heat, scar mobilization, Taping, Vasopneumatic device, Manual therapy, and Re-evaluation  PLAN FOR NEXT SESSION: , progress as tolerated do not use ktape, has skin reaction/allergy, HEP initiation for initial post R TSA, FOTO   Norman Herrlich PT ,DPT Physical Therapist- [facility]  [facility]   08/17/22 8:21 AM

## 2022-08-17 ENCOUNTER — Encounter: Payer: Self-pay | Admitting: Physical Therapy

## 2022-08-18 ENCOUNTER — Ambulatory Visit: Payer: 59 | Admitting: Physical Therapy

## 2022-08-19 DIAGNOSIS — M2550 Pain in unspecified joint: Secondary | ICD-10-CM | POA: Diagnosis not present

## 2022-08-19 DIAGNOSIS — M7989 Other specified soft tissue disorders: Secondary | ICD-10-CM | POA: Diagnosis not present

## 2022-08-19 DIAGNOSIS — M79643 Pain in unspecified hand: Secondary | ICD-10-CM | POA: Diagnosis not present

## 2022-08-19 DIAGNOSIS — M1990 Unspecified osteoarthritis, unspecified site: Secondary | ICD-10-CM | POA: Diagnosis not present

## 2022-08-19 DIAGNOSIS — Z79899 Other long term (current) drug therapy: Secondary | ICD-10-CM | POA: Diagnosis not present

## 2022-08-19 DIAGNOSIS — R21 Rash and other nonspecific skin eruption: Secondary | ICD-10-CM | POA: Diagnosis not present

## 2022-08-19 DIAGNOSIS — M0609 Rheumatoid arthritis without rheumatoid factor, multiple sites: Secondary | ICD-10-CM | POA: Diagnosis not present

## 2022-08-23 ENCOUNTER — Encounter: Payer: Self-pay | Admitting: Physical Therapy

## 2022-08-23 ENCOUNTER — Ambulatory Visit: Payer: 59 | Admitting: Physical Therapy

## 2022-08-23 DIAGNOSIS — M25511 Pain in right shoulder: Secondary | ICD-10-CM | POA: Diagnosis not present

## 2022-08-23 DIAGNOSIS — S46011A Strain of muscle(s) and tendon(s) of the rotator cuff of right shoulder, initial encounter: Secondary | ICD-10-CM | POA: Diagnosis not present

## 2022-08-23 DIAGNOSIS — M25611 Stiffness of right shoulder, not elsewhere classified: Secondary | ICD-10-CM | POA: Diagnosis not present

## 2022-08-23 NOTE — Therapy (Signed)
OUTPATIENT PHYSICAL THERAPY SHOULDER TREATMENT   Patient Name: JULLIETTE FRENTZ MRN: [id number] DOB:1970/06/30, 52 y.o., female Today's Date: 08/23/2022   PT End of Session - 08/23/22 1345     Visit Number 15    Number of Visits 38    Date for PT Re-Evaluation 11/08/22    Authorization Type [employer]    Progress Note Due on Visit 20    PT Start Time 1345    PT Stop Time 1428    PT Time Calculation (min) 43 min    Activity Tolerance Patient limited by pain    Behavior During Therapy [facility] for tasks assessed/performed;Anxious;Agitated                      Past Medical History:  Diagnosis Date   ADHD (attention deficit hyperactivity disorder)    Ankylosing spondylitis    Anxiety    Arthritis    Depression    Eczema    Family history of adverse reaction to anesthesia    dad would be crazy, talk out of his head-per pt   Headache    Rheumatoid arthritis    Past Surgical History:  Procedure Laterality Date   BLADDER SUSPENSION N/A 04/14/2021   Procedure: TRANSVAGINAL TAPE (TVT) PROCEDURE;  Surgeon: Essie Hart, MD;  Location: [facility] OR;  Service: Gynecology;  Laterality: N/A;  GYNCARE TVT EXACT   CYSTOSCOPY N/A 04/14/2021   Procedure: CYSTOSCOPY;  Surgeon: Essie Hart, MD;  Location: [facility] OR;  Service: Gynecology;  Laterality: N/A;   KNEE ARTHROSCOPY Left 1988   REVERSE SHOULDER ARTHROPLASTY Right 01/05/2022   Procedure: RIGHT SHOULDER REVERSE SHOULDER ARTHROPLASTY;  Surgeon: Huel Cote, MD;  Location: [facility] OR;  Service: Orthopedics;  Laterality: Right;   REVERSE SHOULDER ARTHROPLASTY Right 04/01/2022   Procedure: RIGHT REVERSE SHOULDER ARTHROPLASTY REVISION;  Surgeon: Huel Cote, MD;  Location: [facility] OR;  Service: Orthopedics;  Laterality: Right;   REVERSE SHOULDER ARTHROPLASTY Right 07/22/2022   Procedure: RIGHT SHOULDER REVISION  ARTHROPLASTY;  Surgeon: Huel Cote, MD;  Location: [facility] OR;  Service: Orthopedics;  Laterality: Right;   TONSILLECTOMY     as a child    TOTAL LAPAROSCOPIC HYSTERECTOMY WITH SALPINGECTOMY Bilateral 04/14/2021   Procedure: TOTAL LAPAROSCOPIC HYSTERECTOMY WITH BILATERAL SALPINGECTOMY AND BILATERAL OOPHERECTOMY;  Surgeon: Essie Hart, MD;  Location: [facility] OR;  Service: Gynecology;  Laterality: Bilateral;   Patient Active Problem List   Diagnosis Date Noted   Joint inflammation 06/10/2022   Obesity (BMI 35.0-39.9 without comorbidity) 06/10/2022   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 05/05/2022   History of reverse total replacement of right shoulder joint 04/01/2022   Unilateral primary osteoarthritis, left knee 01/27/2022   Traumatic complete tear of right rotator cuff    S/P laparoscopic hysterectomy 04/14/2021   Grief reaction 03/23/2019   Gastroesophageal reflux disease without esophagitis 03/23/2019   Constipation 03/23/2019   Eczema 03/23/2019   Pain in joint, multiple sites 07/14/2018   Family history of arthritis 07/14/2018   Peeling skin 07/14/2018   Class 3 severe obesity due to excess calories with body mass index (BMI) of 40.0 to 44.9 in adult 07/14/2018   Anxiety 07/14/2018   Depression 07/14/2018     REFERRING PROVIDER: Huel Cote, MD  REFERRING DIAG: S46.011A (ICD-10-CM) - Traumatic complete tear of right rotator cuff, initial encounter Right R.TSA with biceps tenodesis, NO subscap repair  THERAPY DIAG:  Acute pain of right shoulder  Stiffness of right shoulder, not elsewhere classified  Traumatic complete tear of  right rotator cuff, initial encounter  Rationale for Evaluation and Treatment Rehabilitation  ONSET DATE: DOS initial rTSA 01/05/22       DOS revision 04/01/22   DOS revision 2: 07/22/22 - replacement of broken screws and another revision   SUBJECTIVE:                                                                                                                                                                                      SUBJECTIVE STATEMENT: Pt  reports continued intense pain with any use if the involved shoulder or any attempts at passive movement. Pt points to pain along anteriora and lateral deltoid and along incision.   PERTINENT HISTORY:  Pt reports she has been in intense pain in her back, knees and shoulders. Pt sees MD next Wednesday for follow up. Pt reports that she lives in [place] and wanted to come to a clinic that is closer so she transitioned from [facility] to this clinic. Pt has not had PT in many weeks. Pt had 2 surgeries for R TSA with most recent one the Thursday after thanksgiving. Pt reports no falls since her previous visit but her knee continues to give out.  Patient reports continued intense pain in her right deltoid region.  Patient has high level of anxiety and reports this is why she did not attempt aquatic therapy and despite all of the benefits aquatic therapy could bring to her shoulder range of motion and pain.  Pt had third R TSA surgery on 07/22/22 RA  PAIN:  Are you having pain? Yes: NPRS scale: 10/10 Pain location: right shoulder Pain description: not a lot of pain, just sore, tight muscle pain Aggravating factors: constant Relieving factors: rest  PRECAUTIONS: Shoulder do not use ktape, has skin reaction/allergy   WEIGHT BEARING RESTRICTIONSnone on 2/5  FALLS:  Has patient fallen in last 6 months? Yes. 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  Patient has been keeping right shoulder in abducted and internally rotated position.  Patient experiences extreme levels  of pain and muscle guarding when attempts for passive range of motion of the shoulder requires cues for slow pursed lip breathing in order to attempt to activate parasympathetic nervous system  and diminish abnormal pain responses.  UPPER EXTREMITY MMT:  Not appropriate to test at this time, pt cannot move shoulder at this time actively    PALPATION:  04/08/22: Guarded, upper trap tension Plapable tightness through middle deltoid   TODAY'S TREATMENT:   Physical therapy treatment session today consisted of completing assessment of goals and administration of testing as demonstrated and documented in flow sheet, treatment, and goals section of this note. Addition treatments may be found below.    Treatment 08/23/22  TE  MHP applied to R shoulder dutring neck and periscapulat stretching.  UT stretch 2 x 45 sec  Levator stretch 2 x 45 sec   Shoulder rolls- attempted but unable to complete due to intense pain.   Scapular retractions 2 x 10   Attempted passive range of motion of the shoulder and had patient thoroughly instructed and frequently instructed in pursed lip breathing. Unsuccessful in attempts to move shoulder or elbow this date with pt contracting extensor and adductor muscles. Multiple attempts but unable to get any productive passive movement.   Following this physical therapist performs 10 minutes of scar mobilization to scar that has now been cut for a third time.  Soft tissue mobilization with biofreeze for pain modulation to lateral and anterior deltoid. Lots of sensitive muscle areas.        PATIENT EDUCATION: Education details: Teacher, music of condition, POC, HEP, exercise form/rationale Person educated: Patient Education method: Explanation, Demonstration, Tactile cues, Verbal cues, and Handouts Education comprehension: verbalized understanding, returned demonstration, verbal cues required, tactile cues required, and needs further education   HOME EXERCISE  PROGRAM: Begin visit 2   ASSESSMENT:  CLINICAL IMPRESSION:  Patient presents to therapy with continued hypersensitivity in the right shoulder with intense pain with most treatment options attempted again this date.  Patient continues to demonstrate significantly high pain responses to passive range of motion and requires frequent cues for relaxation of target musculature in order to improve tolerance although this was unsuccessful this date.  Patient's range of motion continues to be very poor both actively and passively at this time.  Patient is considering canceling next appointment may be coming to 1 more prior to her visit to the doctor if she feels something is not right with her right shoulder.  Physical therapist asked her if there is any known injury to it and she reports she has not followed anything to it that should have damaged it she just feels something is wrong.  Pt will continue to benefit from skilled physical therapy intervention to address impairments, improve QOL, and attain therapy goals.  Patient instructed to complete neck stretches and scapular retractions completed in today's session for for initial home exercise program and patient verbalizes understanding.     OBJECTIVE IMPAIRMENTS decreased activity tolerance, decreased mobility, decreased ROM, decreased strength, increased edema, increased muscle spasms, impaired flexibility, impaired UE functional use, improper body mechanics, postural dysfunction, and pain.   ACTIVITY LIMITATIONS carrying, lifting, transfers, bed mobility, dressing, and hygiene/grooming  PARTICIPATION LIMITATIONS: meal prep, cleaning, laundry, driving, shopping, community activity, and occupation  PERSONAL FACTORS Fitness and 1-2 comorbidities: RA, obesity  are also affecting patient's functional outcome.   REHAB POTENTIAL: Fair second revision and reported muscle and nerve damage form physician. Physican would be "happy" with 90 degrees of  elevation.   CLINICAL DECISION MAKING: Stable/uncomplicated  EVALUATION COMPLEXITY: Low   GOALS: Goals reviewed with patient? Yes   Short term Goals Date for completion 09/27/2022    Patient will  be independent in home exercise program to improve strength/mobility for better functional independence with ADLs. Baseline: Patient not completing home exercises at this time since her second revision Goal status: INITIAL  Long Term Goals date for completion: 11/08/2022   1.  Patient will activate all heads of deltoid muscle including anterior lateral and posterior deltoids in order to improve active range of motion capability of the replaced right shoulder joint Baseline: Unable to activate heads of deltoid due to debilitating pain with any active movement of the shoulder.  Patient keep shoulder in protected position similar to that of which a sling would be an Goal status: INITIAL  2.  Patient will achieve 70 degrees or greater of active flexion and abduction in order to indicate improved functional mobility of the right shoulder Baseline: Patient unable to activate heads of deltoid to elevate shoulder and therefore is only able to keep shoulder in neutral position. Goal status: INITIAL  3.  Patient will achieve 90 degrees or greater of passive flexion and abduction of her right shoulder or to indicate improved potential for active range of motion and decreased pain with every day activities Baseline: unable to move past 5 degrees from flexion and abduction  Goal status: INITIAL  4.  Patient will improve focus on therapeutic outcome survey score by 10 points or greater in order to indicate improved functional use of her right upper extremity Baseline: no FOTO setup on re-eval, will complete next session   Goal status: INITIAL    PLAN: PT FREQUENCY: 1-2x/week  PT DURATION: 8 weeks  PLANNED INTERVENTIONS: Therapeutic exercises, Therapeutic activity, Neuromuscular re-education,  Patient/Family education, Self Care, Joint mobilization, Aquatic Therapy, Dry Needling, Electrical stimulation, Spinal mobilization, Cryotherapy, Moist heat, scar mobilization, Taping, Vasopneumatic device, Manual therapy, and Re-evaluation  PLAN FOR NEXT SESSION: , progress as tolerated do not use ktape, has skin reaction/allergy, HEP progression, FOTO   Norman Herrlich PT ,DPT Physical Therapist- [facility]  [facility]   08/23/22 4:19 PM

## 2022-08-25 ENCOUNTER — Ambulatory Visit: Payer: 59 | Admitting: Physical Therapy

## 2022-08-26 ENCOUNTER — Ambulatory Visit: Payer: 59 | Admitting: Urology

## 2022-08-26 ENCOUNTER — Encounter: Payer: Self-pay | Admitting: Urology

## 2022-08-26 VITALS — BP 132/84 | HR 103 | Ht 66.0 in | Wt 291.0 lb

## 2022-08-26 DIAGNOSIS — R748 Abnormal levels of other serum enzymes: Secondary | ICD-10-CM | POA: Diagnosis not present

## 2022-08-26 DIAGNOSIS — R8281 Pyuria: Secondary | ICD-10-CM | POA: Diagnosis not present

## 2022-08-26 DIAGNOSIS — N3281 Overactive bladder: Secondary | ICD-10-CM

## 2022-08-26 LAB — URINALYSIS, COMPLETE
Bilirubin, UA: NEGATIVE
Glucose, UA: NEGATIVE
Ketones, UA: NEGATIVE
Nitrite, UA: NEGATIVE
Protein,UA: NEGATIVE
RBC, UA: NEGATIVE
Specific Gravity, UA: >1.03 — ABNORMAL HIGH (ref 1.005–1.030)
Urobilinogen, Ur: 0.2 mg/dL (ref 0.2–1.0)
pH, UA: 5 (ref 5.0–7.5)

## 2022-08-26 LAB — MICROSCOPIC EXAMINATION

## 2022-08-26 MED ORDER — SULFAMETHOXAZOLE-TRIMETHOPRIM 800-160 MG PO TABS
1.0000 | ORAL_TABLET | Freq: Once | ORAL | Status: AC
Start: 2022-08-26 — End: 2022-08-26
  Administered 2022-08-26: 1 via ORAL

## 2022-08-26 NOTE — Progress Notes (Signed)
   08/26/22  CC:  Chief Complaint  Patient presents with   Cysto    HPI: 52 y.o. female seen October 2023 for overactive bladder.  See my note 02/01/2022.  Previously no improvement in symptoms with oxybutynin XL.  She was also given samples of Gemtesa which were not effective.  Subsequently given a trial of Myrbetriq  Blood pressure 132/84, pulse (!) 103, height  (1.676 m), weight 291 lb (132 kg), last menstrual period 03/24/2021. NED. A&Ox3.   No respiratory distress   Abd soft, NT, ND Normal external genitalia with patent urethral meatus  Cystoscopy Procedure Note  Patient identification was confirmed, informed consent was obtained, and patient was prepped using Betadine solution.  Lidocaine jelly was administered per urethral meatus.    Procedure: - Flexible cystoscope introduced, without any difficulty.   - Thorough search of the bladder revealed:    normal urethral meatus    normal urothelium    no stones    no ulcers     no tumors    no urethral polyps    no trabeculation  - Ureteral orifices were normal in position and appearance.  Post-Procedure: - Patient tolerated the procedure well  Assessment/ Plan: Negative cystoscopy, no evidence sling erosion It has been 6 months since her last visit and she would like to [employer].  Samples given.  If not effective will asked Dr. Sherron Monday to see for consideration of Botox Mild pyuria on UA today-urine culture ordered.  Given a single dose Septra DS post cystoscopy    Anna Altes, MD

## 2022-08-28 ENCOUNTER — Encounter: Payer: Self-pay | Admitting: Urology

## 2022-08-29 LAB — CULTURE, URINE COMPREHENSIVE

## 2022-08-30 ENCOUNTER — Ambulatory Visit: Payer: 59 | Admitting: Physical Therapy

## 2022-08-31 LAB — CULTURE, URINE COMPREHENSIVE

## 2022-09-01 ENCOUNTER — Encounter: Payer: Self-pay | Admitting: Urology

## 2022-09-01 ENCOUNTER — Ambulatory Visit: Payer: 59 | Attending: Orthopaedic Surgery

## 2022-09-01 DIAGNOSIS — M25511 Pain in right shoulder: Secondary | ICD-10-CM | POA: Insufficient documentation

## 2022-09-01 DIAGNOSIS — M25611 Stiffness of right shoulder, not elsewhere classified: Secondary | ICD-10-CM

## 2022-09-01 DIAGNOSIS — S46011A Strain of muscle(s) and tendon(s) of the rotator cuff of right shoulder, initial encounter: Secondary | ICD-10-CM | POA: Diagnosis not present

## 2022-09-01 NOTE — Therapy (Signed)
OUTPATIENT PHYSICAL THERAPY SHOULDER TREATMENT   Patient Name: SHAYLON GILLEAN MRN: [id number] DOB:May 12, 1970, 52 y.o., female Today's Date: 09/01/2022              Past Medical History:  Diagnosis Date   ADHD (attention deficit hyperactivity disorder)    Ankylosing spondylitis (HCC)    Anxiety    Arthritis    Depression    Eczema    Family history of adverse reaction to anesthesia    dad would be crazy, talk out of his head-per pt   Headache    Rheumatoid arthritis [facility])    Past Surgical History:  Procedure Laterality Date   BLADDER SUSPENSION N/A 04/14/2021   Procedure: TRANSVAGINAL TAPE (TVT) PROCEDURE;  Surgeon: Essie Hart, MD;  Location: [facility];  Service: Gynecology;  Laterality: N/A;  GYNCARE TVT EXACT   CYSTOSCOPY N/A 04/14/2021   Procedure: CYSTOSCOPY;  Surgeon: Essie Hart, MD;  [facility];  Service: Gynecology;  Laterality: N/A;   KNEE ARTHROSCOPY Left 1988   REVERSE SHOULDER ARTHROPLASTY Right 01/05/2022   Procedure: RIGHT SHOULDER REVERSE SHOULDER ARTHROPLASTY;  Surgeon: Huel Cote, MD;  Location: [facility];  Service: Orthopedics;  Laterality: Right;   REVERSE SHOULDER ARTHROPLASTY Right 04/01/2022   Procedure: RIGHT REVERSE SHOULDER ARTHROPLASTY REVISION;  Surgeon: Huel Cote, MD;  [facility];  Service: Orthopedics;  Laterality: Right;   REVERSE SHOULDER ARTHROPLASTY Right 07/22/2022   Procedure: RIGHT SHOULDER REVISION  ARTHROPLASTY;  Surgeon: Huel Cote, MD;  [facility];  Service: Orthopedics;  Laterality: Right;   TONSILLECTOMY     as a child   TOTAL LAPAROSCOPIC HYSTERECTOMY WITH SALPINGECTOMY Bilateral 04/14/2021   Procedure: TOTAL LAPAROSCOPIC HYSTERECTOMY WITH BILATERAL SALPINGECTOMY AND BILATERAL OOPHERECTOMY;  Surgeon: Essie Hart, MD;  [facility];  Service: Gynecology;  Laterality: Bilateral;   Patient Active Problem List   Diagnosis Date Noted   Joint inflammation 06/10/2022   Obesity (BMI 35.0-39.9 without  comorbidity) 06/10/2022   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) 05/05/2022   History of reverse total replacement of right shoulder joint 04/01/2022   Unilateral primary osteoarthritis, left knee 01/27/2022   Traumatic complete tear of right rotator cuff    S/P laparoscopic hysterectomy 04/14/2021   Grief reaction 03/23/2019   Gastroesophageal reflux disease without esophagitis 03/23/2019   Constipation 03/23/2019   Eczema 03/23/2019   Pain in joint, multiple sites 07/14/2018   Family history of arthritis 07/14/2018   Peeling skin 07/14/2018   Class 3 severe obesity due to excess calories with body mass index (BMI) of 40.0 to 44.9 in adult [facility]) 07/14/2018   Anxiety 07/14/2018   Depression 07/14/2018     REFERRING PROVIDER: Huel Cote, MD  REFERRING DIAG: S46.011A (ICD-10-CM) - Traumatic complete tear of right rotator cuff, initial encounter Right R.TSA with biceps tenodesis, NO subscap repair  THERAPY DIAG:  Acute pain of right shoulder  Stiffness of right shoulder, not elsewhere classified  Traumatic complete tear of right rotator cuff, initial encounter  Rationale for Evaluation and Treatment Rehabilitation  ONSET DATE: DOS initial rTSA 01/05/22       DOS revision 04/01/22   DOS revision 2: 07/22/22 - replacement of broken screws and another revision   SUBJECTIVE:  SUBJECTIVE STATEMENT: Pt reports continued intense pain with any use if the involved shoulder or any attempts at passive movement. Pt points to pain along anteriora and lateral deltoid and along incision.   PERTINENT HISTORY:  Pt reports she has been in intense pain in her back, knees and shoulders. Pt sees MD next Wednesday for follow up. Pt reports that she lives in [place] and wanted to come to a  clinic that is closer so [facility]. Pt has not had PT in many weeks. Pt had 2 surgeries for R TSA with most recent one the Thursday after thanksgiving. Pt reports no falls since her previous visit but her knee continues to give out.  Patient reports continued intense pain in her right deltoid region.  Patient has high level of anxiety and reports this is why she did not attempt aquatic therapy and despite all of the benefits aquatic therapy could bring to her shoulder range of motion and pain.  Pt had third R TSA surgery on 07/22/22 RA  PAIN:  Are you having pain? Yes: NPRS scale: 10/10 Pain location: right shoulder Pain description: not a lot of pain, just sore, tight muscle pain Aggravating factors: constant Relieving factors: rest  PRECAUTIONS: Shoulder do not use ktape, has skin reaction/allergy   WEIGHT BEARING RESTRICTIONSnone on 2/5  FALLS:  Has patient fallen in last 6 months? Yes. 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  Patient has been keeping right shoulder in abducted and internally rotated position.  Patient experiences extreme levels of pain and muscle guarding when attempts for passive range of motion of the shoulder requires cues for slow pursed lip breathing in order to attempt to activate parasympathetic nervous system and diminish abnormal pain responses.  UPPER EXTREMITY MMT:  Not appropriate to test at this time, pt cannot move shoulder at this time actively    PALPATION:  04/08/22: Guarded, upper  trap tension Plapable tightness through middle deltoid   TODAY'S TREATMENT:   Physical therapy treatment session today consisted of completing assessment of goals and administration of testing as demonstrated and documented in flow sheet, treatment, and goals section of this note. Addition treatments may be found below.    Treatment 09/01/22  TE  MHP applied to R shoulder dutring neck and periscapulat stretching.  UT stretch 2 x 45 sec  Levator stretch 2 x 45 sec   Shoulder rolls- attempted but unable to complete due to intense pain.   Scapular retractions 2 x 10   Attempted passive range of motion of the shoulder and had patient thoroughly instructed and frequently instructed in pursed lip breathing. Unsuccessful in attempts to move shoulder or elbow this date with pt contracting extensor and adductor muscles. Multiple attempts but unable to get any productive passive movement.   Following this physical therapist performs 10 minutes of scar mobilization to scar that has now been cut for a third time.  Soft tissue mobilization with biofreeze for pain modulation to lateral and anterior deltoid. Lots of sensitive muscle areas.  PATIENT EDUCATION: Education details: Teacher, music of condition, POC, HEP, exercise form/rationale Person educated: Patient Education method: Explanation, Demonstration, Tactile cues, Verbal cues, and Handouts Education comprehension: verbalized understanding, returned demonstration, verbal cues required, tactile cues required, and needs further education   HOME EXERCISE PROGRAM: Begin visit 2   ASSESSMENT:  CLINICAL IMPRESSION:  Patient presents to therapy with continued hypersensitivity in the right shoulder with intense pain with most treatment options attempted again this date.  Patient continues to demonstrate significantly high pain responses to passive range of motion and requires frequent cues for relaxation of target musculature in order to  improve tolerance although this was unsuccessful this date.  Patient's range of motion continues to be very poor both actively and passively at this time.  Patient is considering canceling next appointment may be coming to 1 more prior to her visit to the doctor if she feels something is not right with her right shoulder.  Physical therapist asked her if there is any known injury to it and she reports she has not done anything to it that should have damaged it she just feels something is wrong.  Pt will continue to benefit from skilled physical therapy intervention to address impairments, improve QOL, and attain therapy goals.  Patient instructed to complete neck stretches and scapular retractions completed in today's session for for initial home exercise program and patient verbalizes understanding.     OBJECTIVE IMPAIRMENTS decreased activity tolerance, decreased mobility, decreased ROM, decreased strength, increased edema, increased muscle spasms, impaired flexibility, impaired UE functional use, improper body mechanics, postural dysfunction, and pain.   ACTIVITY LIMITATIONS carrying, lifting, transfers, bed mobility, dressing, and hygiene/grooming  PARTICIPATION LIMITATIONS: meal prep, cleaning, laundry, driving, shopping, community activity, and occupation  PERSONAL FACTORS Fitness and 1-2 comorbidities: RA, obesity  are also affecting patient's functional outcome.   REHAB POTENTIAL: Fair second revision and reported muscle and nerve damage form physician. Physican would be "happy" with 90 degrees of elevation.   CLINICAL DECISION MAKING: Stable/uncomplicated  EVALUATION COMPLEXITY: Low   GOALS: Goals reviewed with patient? Yes   Short term Goals Date for completion 09/27/2022    Patient will be independent in home exercise program to improve strength/mobility for better functional independence with ADLs. Baseline: Patient not completing home exercises at this time since her second  revision Goal status: INITIAL  Long Term Goals date for completion: 11/08/2022   1.  Patient will activate all heads of deltoid muscle including anterior lateral and posterior deltoids in order to improve active range of motion capability of the replaced right shoulder joint Baseline: Unable to activate heads of deltoid due to debilitating pain with any active movement of the shoulder.  Patient keep shoulder in protected position similar to that of which a sling would be an Goal status: INITIAL  2.  Patient will achieve 70 degrees or greater of active flexion and abduction in order to indicate improved functional mobility of the right shoulder Baseline: Patient unable to activate heads of deltoid to elevate shoulder and therefore is only able to keep shoulder in neutral position. Goal status: INITIAL  3.  Patient will achieve 90 degrees or greater of passive flexion and abduction of her right shoulder or to indicate improved potential for active range of motion and decreased pain with every day activities Baseline: unable to move past 5 degrees from flexion and abduction  Goal status: INITIAL  4.  Patient will improve focus on therapeutic outcome survey score by 10 points or greater in order  to indicate improved functional use of her right upper extremity Baseline: no FOTO setup on re-eval, will complete next session   Goal status: INITIAL    PLAN: PT FREQUENCY: 1-2x/week  PT DURATION: 8 weeks  PLANNED INTERVENTIONS: Therapeutic exercises, Therapeutic activity, Neuromuscular re-education, Patient/Family education, Self Care, Joint mobilization, Aquatic Therapy, Dry Needling, Electrical stimulation, Spinal mobilization, Cryotherapy, Moist heat, scar mobilization, Taping, Vasopneumatic device, Manual therapy, and Re-evaluation  PLAN FOR NEXT SESSION: , progress as tolerated do not use ktape, has skin reaction/allergy, HEP progression, FOTO   Norman Herrlich PT ,DPT Physical  Therapist- [facility]  [facility]   09/01/22 9:58 AM

## 2022-09-01 NOTE — Therapy (Signed)
OUTPATIENT PHYSICAL THERAPY SHOULDER TREATMENT   Patient Name: Anna Keller MRN: [id number] DOB:07-08-1970, 52 y.o., female Today's Date: 09/01/2022   PT End of Session - 09/01/22 1255     Visit Number 16    Number of Visits 38    Date for PT Re-Evaluation 11/08/22    Authorization Type [employer]    Progress Note Due on Visit 20    PT Start Time 1300    PT Stop Time 1344    PT Time Calculation (min) 44 min    Activity Tolerance Patient limited by pain    Behavior During Therapy [facility] for tasks assessed/performed;Anxious;Agitated                       Past Medical History:  Diagnosis Date   ADHD (attention deficit hyperactivity disorder)    Ankylosing spondylitis (HCC)    Anxiety    Arthritis    Depression    Eczema    Family history of adverse reaction to anesthesia    dad would be crazy, talk out of his head-per pt   Headache    Rheumatoid arthritis (HCC)    Past Surgical History:  Procedure Laterality Date   BLADDER SUSPENSION N/A 04/14/2021   Procedure: TRANSVAGINAL TAPE (TVT) PROCEDURE;  Surgeon: Essie Hart, MD;  [facility];  Service: Gynecology;  Laterality: N/A;  GYNCARE TVT EXACT   CYSTOSCOPY N/A 04/14/2021   Procedure: CYSTOSCOPY;  Surgeon: Essie Hart, MD;  [facility];  Service: Gynecology;  Laterality: N/A;   KNEE ARTHROSCOPY Left 1988   REVERSE SHOULDER ARTHROPLASTY Right 01/05/2022   Procedure: RIGHT SHOULDER REVERSE SHOULDER ARTHROPLASTY;  Surgeon: Huel Cote, MD;  [facility];  Service: Orthopedics;  Laterality: Right;   REVERSE SHOULDER ARTHROPLASTY Right 04/01/2022   Procedure: RIGHT REVERSE SHOULDER ARTHROPLASTY REVISION;  Surgeon: Huel Cote, MD;  [facility];  Service: Orthopedics;  Laterality: Right;   REVERSE SHOULDER ARTHROPLASTY Right 07/22/2022   Procedure: RIGHT SHOULDER REVISION  ARTHROPLASTY;  Surgeon: Huel Cote, MD;  [facility];  Service: Orthopedics;  Laterality: Right;   TONSILLECTOMY      as a child   TOTAL LAPAROSCOPIC HYSTERECTOMY WITH SALPINGECTOMY Bilateral 04/14/2021   Procedure: TOTAL LAPAROSCOPIC HYSTERECTOMY WITH BILATERAL SALPINGECTOMY AND BILATERAL OOPHERECTOMY;  Surgeon: Essie Hart, MD;  [facility];  Service: Gynecology;  Laterality: Bilateral;   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) 05/05/2022   History of reverse total replacement of right shoulder joint 04/01/2022   Unilateral primary osteoarthritis, left knee 01/27/2022   Traumatic complete tear of right rotator cuff    S/P laparoscopic hysterectomy 04/14/2021   Grief reaction 03/23/2019   Gastroesophageal reflux disease without esophagitis 03/23/2019   Constipation 03/23/2019   Eczema 03/23/2019   Pain in joint, multiple sites 07/14/2018   Family history of arthritis 07/14/2018   Peeling skin 07/14/2018   Class 3 severe obesity due to excess calories with body mass index (BMI) of 40.0 to 44.9 in adult (HCC) 07/14/2018   Anxiety 07/14/2018   Depression 07/14/2018     REFERRING PROVIDER: Huel Cote, MD  REFERRING DIAG: S46.011A (ICD-10-CM) - Traumatic complete tear of right rotator cuff, initial encounter Right R.TSA with biceps tenodesis, NO subscap repair  THERAPY DIAG:  Acute pain of right shoulder  Stiffness of right shoulder, not elsewhere classified  Traumatic complete tear of right rotator cuff, initial encounter  Rationale for Evaluation and Treatment Rehabilitation  ONSET DATE: DOS initial rTSA 01/05/22       DOS revision 04/01/22   DOS revision 2: 07/22/22 - replacement of broken screws and another revision   SUBJECTIVE:                                                                                                                                                                                       SUBJECTIVE STATEMENT: Patient meets with surgeon on Friday due to limited progress and high pain.   PERTINENT HISTORY:  Pt reports she has been in intense pain in her back, knees and shoulders. Pt sees MD next Wednesday for follow up. Pt reports that she lives in [place] and wanted to come to a clinic that is closer so [facility]. Pt has not had PT in many weeks. Pt had 2 surgeries for R TSA with most recent one the Thursday after thanksgiving. Pt reports no falls since her previous visit but her knee continues to give out.  Patient reports continued intense pain in her right deltoid region.  Patient has high level of anxiety and reports this is why she did not attempt aquatic therapy and despite all of the benefits aquatic therapy could bring to her shoulder range of motion and pain.  Pt had third R TSA surgery on 07/22/22 RA  PAIN:  Are you having pain? Yes: NPRS scale: 10/10 Pain location: right shoulder Pain description: not a lot of pain, just sore, tight muscle pain Aggravating factors: constant Relieving factors: rest  PRECAUTIONS: Shoulder do not use ktape, has skin reaction/allergy   WEIGHT BEARING RESTRICTIONSnone on 2/5  FALLS:  Has patient fallen in last 6 months? Yes. 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  Patient has been keeping right shoulder in abducted and internally rotated position.  Patient experiences extreme levels of pain and muscle guarding when attempts for passive range of motion of the  shoulder requires cues for slow pursed lip breathing in order to attempt to activate parasympathetic nervous system and diminish abnormal pain responses.  UPPER EXTREMITY MMT:  Not appropriate to  test at this time, pt cannot move shoulder at this time actively    PALPATION:  04/08/22: Guarded, upper trap tension Plapable tightness through middle deltoid   TODAY'S TREATMENT:   Physical therapy treatment session today consisted of completing assessment of goals and administration of testing as demonstrated and documented in flow sheet, treatment, and goals section of this note. Addition treatments may be found below.    Treatment 09/01/22  TherEx  MHP applied to R shoulder dutring neck and periscapulat stretching.  UT stretch 2 x 45 sec  Levator stretch 2 x 45 sec  Scapular retractions 10   Seated: UE Ranger: -flexion/extension AAROM 15x -ER/IR AAROM 15x -clockwise circle AAROM 5 Manual:  Attempted passive range of motion of the shoulder and had patient thoroughly instructed and frequently instructed in pursed lip breathing. Unsuccessful in attempts to move shoulder or elbow this date with pt contracting extensor and adductor muscles. Multiple attempts but unable to get any productive passive movement.   Ice cup massage to R shoulder, bicep x 8 minutes  Sensitization training: wash cloth; added to HEP       PATIENT EDUCATION: Education details: Teacher, music of condition, POC, HEP, exercise form/rationale Person educated: Patient Education method: Explanation, Demonstration, Tactile cues, Verbal cues, and Handouts Education comprehension: verbalized understanding, returned demonstration, verbal cues required, tactile cues required, and needs further education   HOME EXERCISE PROGRAM: Begin visit 2   ASSESSMENT:  CLINICAL IMPRESSION:  Patient introduced to ice cup massage, AAROM with UE ranger, and skin sensitization training. She does not tolerate sensitization training  with washcloth but does tolerate ice cup massage. Patient to see surgeon Friday for follow up about limited progress and high pain.   Patient instructed to complete neck stretches and scapular retractions completed in today's session for for initial home exercise program and patient verbalizes understanding.     OBJECTIVE IMPAIRMENTS decreased activity tolerance, decreased mobility, decreased ROM, decreased strength, increased edema, increased muscle spasms, impaired flexibility, impaired UE functional use, improper body mechanics, postural dysfunction, and pain.   ACTIVITY LIMITATIONS carrying, lifting, transfers, bed mobility, dressing, and hygiene/grooming  PARTICIPATION LIMITATIONS: meal prep, cleaning, laundry, driving, shopping, community activity, and occupation  PERSONAL FACTORS Fitness and 1-2 comorbidities: RA, obesity  are also affecting patient's functional outcome.   REHAB POTENTIAL: Fair second revision and reported muscle and nerve damage form physician. Physican would be "happy" with 90 degrees of elevation.   CLINICAL DECISION MAKING: Stable/uncomplicated  EVALUATION COMPLEXITY: Low   GOALS: Goals reviewed with patient? Yes   Short term Goals Date for completion 09/27/2022    Patient will be independent in home exercise program to improve strength/mobility for better functional independence with ADLs. Baseline: Patient not completing home exercises at this time since her second revision Goal status: INITIAL  Long Term Goals date for completion: 11/08/2022   1.  Patient will activate all heads of deltoid muscle including anterior lateral and posterior deltoids in order to improve active range of motion capability of the replaced right shoulder joint Baseline: Unable to activate heads of deltoid due to debilitating pain with any active movement of the shoulder.  Patient keep shoulder in protected position similar to that of which a sling would be an Goal status:  INITIAL  2.  Patient will achieve 70 degrees or greater of active flexion and abduction in order to indicate improved functional mobility of the right shoulder Baseline: Patient unable to activate heads of deltoid to elevate shoulder and therefore is only able  to keep shoulder in neutral position. Goal status: INITIAL  3.  Patient will achieve 90 degrees or greater of passive flexion and abduction of her right shoulder or to indicate improved potential for active range of motion and decreased pain with every day activities Baseline: unable to move past 5 degrees from flexion and abduction  Goal status: INITIAL  4.  Patient will improve focus on therapeutic outcome survey score by 10 points or greater in order to indicate improved functional use of her right upper extremity Baseline: no FOTO setup on re-eval, will complete next session   Goal status: INITIAL    PLAN: PT FREQUENCY: 1-2x/week  PT DURATION: 8 weeks  PLANNED INTERVENTIONS: Therapeutic exercises, Therapeutic activity, Neuromuscular re-education, Patient/Family education, Self Care, Joint mobilization, Aquatic Therapy, Dry Needling, Electrical stimulation, Spinal mobilization, Cryotherapy, Moist heat, scar mobilization, Taping, Vasopneumatic device, Manual therapy, and Re-evaluation  PLAN FOR NEXT SESSION: , progress as tolerated do not use ktape, has skin reaction/allergy, HEP progression, FOTO   Precious Bard PT ,DPT Physical Therapist- [facility]  [facility]   09/01/22 2:04 PM

## 2022-09-02 ENCOUNTER — Ambulatory Visit (INDEPENDENT_AMBULATORY_CARE_PROVIDER_SITE_OTHER): Payer: 59 | Admitting: Nurse Practitioner

## 2022-09-02 ENCOUNTER — Encounter: Payer: Self-pay | Admitting: Nurse Practitioner

## 2022-09-02 VITALS — BP 140/90 | HR 100 | Ht 66.0 in | Wt 292.6 lb

## 2022-09-02 DIAGNOSIS — G4700 Insomnia, unspecified: Secondary | ICD-10-CM | POA: Diagnosis not present

## 2022-09-02 DIAGNOSIS — F419 Anxiety disorder, unspecified: Secondary | ICD-10-CM | POA: Diagnosis not present

## 2022-09-02 DIAGNOSIS — F988 Other specified behavioral and emotional disorders with onset usually occurring in childhood and adolescence: Secondary | ICD-10-CM

## 2022-09-02 DIAGNOSIS — F32A Depression, unspecified: Secondary | ICD-10-CM

## 2022-09-02 DIAGNOSIS — G8929 Other chronic pain: Secondary | ICD-10-CM

## 2022-09-02 MED ORDER — ALPRAZOLAM 1 MG PO TABS
1.0000 mg | ORAL_TABLET | Freq: Three times a day (TID) | ORAL | 1 refills | Status: DC | PRN
Start: 2022-09-02 — End: 2022-10-18

## 2022-09-02 MED ORDER — DOXEPIN HCL 25 MG PO CAPS
25.0000 mg | ORAL_CAPSULE | Freq: Every day | ORAL | 1 refills | Status: DC
Start: 2022-09-02 — End: 2022-10-27

## 2022-09-02 MED ORDER — AMPHETAMINE-DEXTROAMPHETAMINE 20 MG PO TABS
20.0000 mg | ORAL_TABLET | Freq: Two times a day (BID) | ORAL | 0 refills | Status: DC
Start: 2022-09-02 — End: 2022-10-18

## 2022-09-02 MED ORDER — FLUOXETINE HCL 60 MG PO TABS
60.0000 mg | ORAL_TABLET | Freq: Every day | ORAL | 3 refills | Status: DC
Start: 2022-09-02 — End: 2022-09-06

## 2022-09-02 MED ORDER — TRAMADOL HCL 50 MG PO TABS
100.0000 mg | ORAL_TABLET | Freq: Two times a day (BID) | ORAL | 2 refills | Status: DC | PRN
Start: 2022-09-02 — End: 2022-10-19

## 2022-09-02 NOTE — Progress Notes (Signed)
Established Patient Office Visit  Subjective:  Patient ID: Anna Keller, female    DOB: 1970/12/25  Age: 52 y.o. MRN: [id number]  Chief Complaint  Patient presents with   Acute Visit    Insomnia and discuss increasing nerve meds.    Patient having increased anxiety and reports she needs something to relax her body.  Not sleeping, trazodone ineffective.      No other concerns at this time.   Past Medical History:  Diagnosis Date   ADHD (attention deficit hyperactivity disorder)    Ankylosing spondylitis (HCC)    Anxiety    Arthritis    Depression    Eczema    Family history of adverse reaction to anesthesia    dad would be crazy, talk out of his head-per pt   Headache    Rheumatoid arthritis (HCC)     Past Surgical History:  Procedure Laterality Date   BLADDER SUSPENSION N/A 04/14/2021   Procedure: TRANSVAGINAL TAPE (TVT) PROCEDURE;  Surgeon: Essie Hart, MD;  Location: [facility];  Service: Gynecology;  Laterality: N/A;  GYNCARE TVT EXACT   CYSTOSCOPY N/A 04/14/2021   Procedure: CYSTOSCOPY;  Surgeon: Essie Hart, MD;  Location: [facility];  Service: Gynecology;  Laterality: N/A;   KNEE ARTHROSCOPY Left 1988   REVERSE SHOULDER ARTHROPLASTY Right 01/05/2022   Procedure: RIGHT SHOULDER REVERSE SHOULDER ARTHROPLASTY;  Surgeon: Huel Cote, MD;  Location: [facility];  Service: Orthopedics;  Laterality: Right;   REVERSE SHOULDER ARTHROPLASTY Right 04/01/2022   Procedure: RIGHT REVERSE SHOULDER ARTHROPLASTY REVISION;  Surgeon: Huel Cote, MD;  [facility];  Service: Orthopedics;  Laterality: Right;   REVERSE SHOULDER ARTHROPLASTY Right 07/22/2022   Procedure: RIGHT SHOULDER REVISION  ARTHROPLASTY;  Surgeon: Huel Cote, MD;  [facility];  Service: Orthopedics;  Laterality: Right;   TONSILLECTOMY     as a child   TOTAL LAPAROSCOPIC HYSTERECTOMY WITH SALPINGECTOMY Bilateral 04/14/2021   Procedure: TOTAL LAPAROSCOPIC HYSTERECTOMY WITH BILATERAL SALPINGECTOMY AND  BILATERAL OOPHERECTOMY;  Surgeon: Essie Hart, MD;  Location[facility];  Service: Gynecology;  Laterality: Bilateral;    Social History   Socioeconomic History   Marital status: Married    Spouse name: Casimiro Needle   Number of children: 1   Years of education: 12   Highest education level: High school graduate  Occupational History   Not on file  Tobacco Use   Smoking status: Former    Packs/day: 1.00    Years: 25.00    Additional pack years: 0.00    Total pack years: 25.00    Types: Cigarettes    Quit date: 01/09/2016    Years since quitting: 6.6   Smokeless tobacco: Never  Vaping Use   Vaping Use: Never used  Substance and Sexual Activity   Alcohol use: Not Currently   Drug use: No   Sexual activity: Yes    Partners: Male    Birth control/protection: None  Other Topics Concern   Not on file  Social History Narrative   Not on file   Social Determinants of Health   Financial Resource Strain: Low Risk  (07/14/2018)   Overall Financial Resource Strain (CARDIA)    Difficulty of Paying Living Expenses: Not hard at all  Food Insecurity: No Food Insecurity (07/14/2018)   Hunger Vital Sign    Worried About Running Out of Food in the Last Year: Never true    Ran Out of Food in the Last Year: Never true  Transportation Needs: No Transportation Needs (04/02/2022)  PRAPARE - Administrator, Civil Service (Medical): No    Lack of Transportation (Non-Medical): No  Physical Activity: Inactive (07/14/2018)   Exercise Vital Sign    Days of Exercise per Week: 0 days    Minutes of Exercise per Session: 0 min  Stress: No Stress Concern Present (07/14/2018)   [employer] of Occupational Health - Occupational Stress Questionnaire    Feeling of Stress : Only a little  Social Connections: Somewhat Isolated (07/14/2018)   Social Connection and Isolation Panel [NHANES]    Frequency of Communication with Friends and Family: More than three times a week    Frequency of Social  Gatherings with Friends and Family: More than three times a week    Attends Religious Services: Never    Database administrator or Organizations: No    Attends Banker Meetings: Never    Marital Status: Married  Catering manager Violence: Not At Risk (04/02/2022)   Humiliation, Afraid, Rape, and Kick questionnaire    Fear of Current or Ex-Partner: No    Emotionally Abused: No    Physically Abused: No    Sexually Abused: No    Family History  Problem Relation Age of Onset   Hypertension Mother    Arthritis Father    Heart disease Sister    Pulmonary Hypertension Sister    COPD Sister    Diabetes Sister    Breast cancer Maternal Grandmother    Heart attack Paternal Grandmother    Breast cancer Maternal Aunt    Breast cancer Cousin     No Known Allergies  Review of Systems  Constitutional:  Positive for malaise/fatigue.  HENT: Negative.    Eyes: Negative.   Respiratory: Negative.    Cardiovascular: Negative.   Gastrointestinal: Negative.   Genitourinary:  Positive for frequency.  Musculoskeletal:  Positive for joint pain and myalgias.  Skin: Negative.   Neurological:  Positive for tremors.  Endo/Heme/Allergies: Negative.   Psychiatric/Behavioral:  Positive for depression. The patient is nervous/anxious and has insomnia.        Objective:   LMP 03/24/2021   There were no vitals filed for this visit.  Physical Exam Vitals reviewed.  Constitutional:      Appearance: Normal appearance.  HENT:     Head: Normocephalic.     Nose: Nose normal.     Mouth/Throat:     Mouth: Mucous membranes are dry.  Eyes:     Pupils: Pupils are equal, round, and reactive to light.  Cardiovascular:     Rate and Rhythm: Normal rate and regular rhythm.  Pulmonary:     Effort: Pulmonary effort is normal.     Breath sounds: Normal breath sounds.  Abdominal:     General: Bowel sounds are normal.     Palpations: Abdomen is soft.  Musculoskeletal:        General:  Swelling and tenderness present.     Cervical back: Normal range of motion and neck supple.  Skin:    General: Skin is warm and dry.  Neurological:     Mental Status: She is alert and oriented to person, place, and time.  Psychiatric:        Mood and Affect: Mood normal.        Behavior: Behavior normal.      No results found for any visits on 09/02/22.  Recent Results (from the past 2160 hour(s))  CBC     Status: Abnormal   Collection Time: 07/22/22 11:24  AM  Result Value Ref Range   WBC 6.1 4.0 - 10.5 K/uL   RBC 4.51 3.87 - 5.11 MIL/uL   Hemoglobin 12.9 12.0 - 15.0 g/dL   HCT 16.1 09.6 - 04.5 %   MCV 89.8 80.0 - 100.0 fL   MCH 28.6 26.0 - 34.0 pg   MCHC 31.9 30.0 - 36.0 g/dL   RDW 40.9 (H) 81.1 - 91.4 %   Platelets 304 150 - 400 K/uL   nRBC 0.0 0.0 - 0.2 %    Comment: Performed at [facility], [address] [place], [place] [address]  Surgical pcr screen     Status: None   Collection Time: 07/22/22 11:24 AM   Specimen: Nasal Mucosa; Nasal Swab  Result Value Ref Range   MRSA, PCR NEGATIVE NEGATIVE   Staphylococcus aureus NEGATIVE NEGATIVE    Comment: (NOTE) The Xpert SA Assay (FDA approved for NASAL specimens in patients 66 years of age and older), is one component of a comprehensive surveillance program. It is not intended to diagnose infection nor to guide or monitor treatment. Performed at [facility], [address], [place], [place] [address]   Type and screen [facility]     Status: None   Collection Time: 07/22/22 11:35 AM  Result Value Ref Range   ABO/RH(D) O NEG    Antibody Screen NEG    Sample Expiration      07/25/2022,2359 Performed at [facility], [address] [place], [place] [address]   Aerobic/Anaerobic Culture w Gram Stain (surgical/deep wound)     Status: None   Collection Time: 07/22/22  2:08 PM   Specimen: PATH Bone biopsy; Tissue  Result Value Ref Range   Specimen Description TISSUE    Special  Requests HUMERUS PT ON VANC ANCEF    Gram Stain NO WBC SEEN NO ORGANISMS SEEN     Culture      No growth aerobically or anaerobically. Performed at [facility], [address] [place], [place] [address]    Report Status 07/27/2022 FINAL   Aerobic/Anaerobic Culture w Gram Stain (surgical/deep wound)     Status: None   Collection Time: 07/22/22  2:13 PM   Specimen: PATH Soft tissue  Result Value Ref Range   Specimen Description TISSUE    Special Requests IMPLANT MEMBRANE PT ON ANCEF VANC    Gram Stain NO WBC SEEN NO ORGANISMS SEEN     Culture      No growth aerobically or anaerobically. Performed at [facility], [address] [place], [place] [address]    Report Status 07/27/2022 FINAL   Urinalysis, Complete     Status: Abnormal   Collection Time: 08/26/22  8:59 AM  Result Value Ref Range   Specific Gravity, UA >1.030 (H) 1.005 - 1.030   pH, UA 5.0 5.0 - 7.5   Color, UA Yellow Yellow   Appearance Ur Hazy (A) Clear   Leukocytes,UA 1+ (A) Negative   Protein,UA Negative Negative/Trace   Glucose, UA Negative Negative   Ketones, UA Negative Negative   RBC, UA Negative Negative   Bilirubin, UA Negative Negative   Urobilinogen, Ur 0.2 0.2 - 1.0 mg/dL   Nitrite, UA Negative Negative   Microscopic Examination See below:   Microscopic Examination     Status: Abnormal   Collection Time: 08/26/22  8:59 AM   Urine  Result Value Ref Range   WBC, UA 11-30 (A) 0 -  5 /hpf   RBC, Urine 0-2 0 - 2 /hpf   Epithelial Cells (non renal) 0-10 0 - 10 /hpf   Casts Present (A) None seen /lpf   Cast Type Hyaline casts N/A   Bacteria, UA Moderate (A) None seen/Few  CULTURE, URINE COMPREHENSIVE     Status: Abnormal   Collection Time: 08/26/22 11:05 AM   Specimen: Urine   UR  Result Value Ref Range   Urine Culture, Comprehensive Final report (A)    Organism ID, Bacteria Escherichia coli (A)     Comment: Cefazolin <=4 ug/mL Cefazolin with an MIC <=16 predicts susceptibility to  the oral agents cefaclor, cefdinir, cefpodoxime, cefprozil, cefuroxime, cephalexin, and loracarbef when used for therapy of uncomplicated urinary tract infections due to E. coli, Klebsiella pneumoniae, and Proteus mirabilis. 50,000-100,000 colony forming units per mL    Organism ID, Bacteria Comment (A)     Comment: Beta hemolytic Streptococcus, group B 50,000-100,000 colony forming units per mL Penicillin and ampicillin are drugs of choice for treatment of beta-hemolytic streptococcal infections. Susceptibility testing of penicillins and other beta-lactam agents approved by the FDA for treatment of beta-hemolytic streptococcal infections need not be performed routinely because nonsusceptible isolates are extremely rare in any beta-hemolytic streptococcus and have not been reported for Streptococcus pyogenes (group A). (CLSI)    ANTIMICROBIAL SUSCEPTIBILITY Comment     Comment:       ** S = Susceptible; I = Intermediate; R = Resistant **                    P = Positive; N = Negative             MICS are expressed in micrograms per mL    Antibiotic                 RSLT#1    RSLT#2    RSLT#3    RSLT#4 Amoxicillin/Clavulanic Acid    S Ampicillin                     S Cefepime                       S Ceftriaxone                    S Cefuroxime                     S Ciprofloxacin                  S Ertapenem                      S Gentamicin                     S Imipenem                       S Levofloxacin                   S Meropenem                      S Nitrofurantoin                 S Piperacillin/Tazobactam        S Tetracycline  S Tobramycin                     S Trimethoprim/Sulfa             S       Assessment & Plan:   Problem List Items Addressed This Visit   None   No follow-ups on file.   Total time spent: 35 minutes  Orson Eva, NP  09/02/2022

## 2022-09-02 NOTE — Patient Instructions (Signed)
1) Stop trazodone 2) Keep alprazolam at current dosing 3) Doxepin for sleep 4) magnesium for restless leg 5) Refill adderall

## 2022-09-03 ENCOUNTER — Ambulatory Visit (INDEPENDENT_AMBULATORY_CARE_PROVIDER_SITE_OTHER): Payer: 59 | Admitting: Orthopaedic Surgery

## 2022-09-03 DIAGNOSIS — Z96611 Presence of right artificial shoulder joint: Secondary | ICD-10-CM

## 2022-09-03 NOTE — Progress Notes (Signed)
Post Operative Evaluation    Procedure/Date of Surgery: Right shoulder revision shoulder arthroplasty 3/21  Interval History:   Laquina presents today for follow-up of her right shoulder revision shoulder arthroplasty.  Overall her progress has been quite slow with her right shoulder.  She is having deltoid pain and spasming with elevation.  She has been having a very hard time established pain management.  She has been recently starting Ozempic.   PMH/PSH/Family History/Social History/Meds/Allergies:    Past Medical History:  Diagnosis Date   ADHD (attention deficit hyperactivity disorder)    Ankylosing spondylitis (HCC)    Anxiety    Arthritis    Depression    Eczema    Family history of adverse reaction to anesthesia    dad would be crazy, talk out of his head-per pt   Headache    Rheumatoid arthritis (HCC)    Past Surgical History:  Procedure Laterality Date   BLADDER SUSPENSION N/A 04/14/2021   Procedure: TRANSVAGINAL TAPE (TVT) PROCEDURE;  Surgeon: Essie Hart, MD;  [facility];  Service: Gynecology;  Laterality: N/A;  GYNCARE TVT EXACT   CYSTOSCOPY N/A 04/14/2021   Procedure: CYSTOSCOPY;  Surgeon: Essie Hart, MD;  [facility];  Service: Gynecology;  Laterality: N/A;   KNEE ARTHROSCOPY Left 1988   REVERSE SHOULDER ARTHROPLASTY Right 01/05/2022   Procedure: RIGHT SHOULDER REVERSE SHOULDER ARTHROPLASTY;  Surgeon: Huel Cote, MD;  Location[facility];  Service: Orthopedics;  Laterality: Right;   REVERSE SHOULDER ARTHROPLASTY Right 04/01/2022   Procedure: RIGHT REVERSE SHOULDER ARTHROPLASTY REVISION;  Surgeon: Huel Cote, MD;  [facility];  Service: Orthopedics;  Laterality: Right;   REVERSE SHOULDER ARTHROPLASTY Right 07/22/2022   Procedure: RIGHT SHOULDER REVISION  ARTHROPLASTY;  Surgeon: Huel Cote, MD;  [facility];  Service: Orthopedics;  Laterality: Right;   TONSILLECTOMY     as a child   TOTAL  LAPAROSCOPIC HYSTERECTOMY WITH SALPINGECTOMY Bilateral 04/14/2021   Procedure: TOTAL LAPAROSCOPIC HYSTERECTOMY WITH BILATERAL SALPINGECTOMY AND BILATERAL OOPHERECTOMY;  Surgeon: Essie Hart, MD;  Location: [facility];  Service: Gynecology;  Laterality: Bilateral;   Social History   Socioeconomic History   Marital status: Married    Spouse name: Casimiro Needle   Number of children: 1   Years of education: 12   Highest education level: High school graduate  Occupational History   Not on file  Tobacco Use   Smoking status: Former    Packs/day: 1.00    Years: 25.00    Additional pack years: 0.00    Total pack years: 25.00    Types: Cigarettes    Quit date: 01/09/2016    Years since quitting: 6.5   Smokeless tobacco: Never  Vaping Use   Vaping Use: Never used  Substance and Sexual Activity   Alcohol use: Not Currently   Drug use: No   Sexual activity: Yes    Partners: Male    Birth control/protection: None  Other Topics Concern   Not on file  Social History Narrative   Not on file   Social Determinants of Health   Financial Resource Strain: Low Risk  (07/14/2018)   Overall Financial Resource Strain (CARDIA)    Difficulty of Paying Living Expenses: Not hard at all  Food Insecurity: No Food Insecurity (07/14/2018)   Hunger Vital Sign    Worried About  Running Out of Food in the Last Year: Never true    Ran Out of Food in the Last Year: Never true  Transportation Needs: No Transportation Needs (04/02/2022)   PRAPARE - Administrator, Civil Service (Medical): No    Lack of Transportation (Non-Medical): No  Physical Activity: Inactive (07/14/2018)   Exercise Vital Sign    Days of Exercise per Week: 0 days    Minutes of Exercise per Session: 0 min  Stress: No Stress Concern Present (07/14/2018)   [employer] of Occupational Health - Occupational Stress Questionnaire    Feeling of Stress : Only a little  Social Connections: Somewhat Isolated (07/14/2018)   Social Connection  and Isolation Panel [NHANES]    Frequency of Communication with Friends and Family: More than three times a week    Frequency of Social Gatherings with Friends and Family: More than three times a week    Attends Religious Services: Never    Database administrator or Organizations: No    Attends Engineer, structural: Never    Marital Status: Married   Family History  Problem Relation Age of Onset   Hypertension Mother    Arthritis Father    Heart disease Sister    Pulmonary Hypertension Sister    COPD Sister    Diabetes Sister    Breast cancer Maternal Grandmother    Heart attack Paternal Grandmother    Breast cancer Maternal Aunt    Breast cancer Cousin    No Known Allergies Current Outpatient Medications  Medication Sig Dispense Refill   ALPRAZolam (XANAX) 1 MG tablet Take 1 tablet (1 mg total) by mouth 3 (three) times daily as needed for anxiety. 90 tablet 1   amphetamine-dextroamphetamine (ADDERALL) 20 MG tablet Take 1 tablet (20 mg total) by mouth 2 (two) times daily. 30 tablet 0   aspirin EC 325 MG tablet Take 1 tablet (325 mg total) by mouth daily. 30 tablet 0   Calcium Carb-Cholecalciferol (CALCIUM 600+D3 PO) Take 1 tablet by mouth 3 (three) times daily.     cholecalciferol (VITAMIN D3) 25 MCG (1000 UNIT) tablet Take 1,000 Units by mouth daily.     diclofenac (VOLTAREN) 50 MG EC tablet Take 50 mg by mouth 2 (two) times daily.     HUMIRA, 2 PEN, 40 MG/0.8ML PNKT Inject 40 mg into the skin every 14 (fourteen) days.     levocetirizine (XYZAL) 5 MG tablet Take 5 mg by mouth every evening.     Multiple Vitamins-Minerals (MULTIVITAMIN WITH MINERALS) tablet Take 1 tablet by mouth daily.     omeprazole (PRILOSEC) 20 MG capsule Take 1 capsule by mouth once daily 90 capsule 0   polyethylene glycol powder (GLYCOLAX/MIRALAX) 17 GM/SCOOP powder Take 17 g by mouth daily. (Patient taking differently: Take 17 g by mouth daily as needed for mild constipation or moderate  constipation.) 3350 g 3   rosuvastatin (CRESTOR) 40 MG tablet Take 40 mg by mouth every evening.     traMADol (ULTRAM) 50 MG tablet Take 2 tablets (100 mg total) by mouth every 12 (twelve) hours as needed. 30 tablet 2   venlafaxine XR (EFFEXOR-XR) 150 MG 24 hr capsule Take 1 capsule (150 mg total) by mouth daily. (Patient taking differently: Take 300 mg by mouth daily with breakfast.) 60 capsule 6   No current facility-administered medications for this visit.   No results found.  Review of Systems:   A ROS was performed including pertinent positives and negatives  as documented in the HPI.   Musculoskeletal Exam:     Right shoulder incision is well-appearing without erythema or drainage.  She is able to flex and extend at the right elbow and right wrist.  In the supine position I am able to forward elevate her to approximately 45 degrees although this is limited due to pain.  2+ radial pulse.  Sensation is intact in the musculocutaneous ulnar median radial distributions.  Fires all 3 heads of the deltoid.  Sensation is intact and equal about both deltoid and the axillary distribution  Imaging:    X-ray right shoulder 3 views: Status post revision shoulder arthroplasty complication  I personally reviewed and interpreted the radiographs.   Assessment:   6-week status post right shoulder revision shoulder arthroplasty without evidence of complication.  Overall she is persistently having shoulder pain and limited function.  She is working on establishing with a pain management clinic although this has been difficult due to her insurance.  She is still having left knee pain with difficulty with this.  She is on Ozempic and working on weight loss.  Given this I do believe that getting her reconnected with Dr. Magnus Ivan for discussion of left knee arthroplasty I think would help to see if she would be a candidate in the future.  She has lost 15 pounds initially.  I will plan to see her back in 4  months with regard to the right shoulder.  -Return to clinic 4 months for right shoulder      I personally saw and evaluated the patient, and participated in the management and treatment plan.  Huel Cote, MD Attending Physician, Orthopedic Surgery  This document was dictated using Dragon voice recognition software. A reasonable attempt at proof reading has been made to minimize errors.

## 2022-09-06 ENCOUNTER — Encounter: Payer: Self-pay | Admitting: Nurse Practitioner

## 2022-09-06 ENCOUNTER — Other Ambulatory Visit: Payer: Self-pay | Admitting: Nurse Practitioner

## 2022-09-06 ENCOUNTER — Ambulatory Visit: Payer: 59 | Admitting: Physical Therapy

## 2022-09-06 MED ORDER — FLUOXETINE HCL 40 MG PO CAPS: 40.0000 mg | ORAL_CAPSULE | Freq: Every day | ORAL | 3 refills | Status: DC

## 2022-09-07 DIAGNOSIS — M0609 Rheumatoid arthritis without rheumatoid factor, multiple sites: Secondary | ICD-10-CM | POA: Diagnosis not present

## 2022-09-08 ENCOUNTER — Ambulatory Visit: Payer: 59 | Admitting: Physical Therapy

## 2022-09-09 ENCOUNTER — Other Ambulatory Visit: Payer: Self-pay | Admitting: Nurse Practitioner

## 2022-09-09 ENCOUNTER — Encounter: Payer: Self-pay | Admitting: Nurse Practitioner

## 2022-09-09 MED ORDER — VENLAFAXINE HCL ER 150 MG PO CP24: 150.0000 mg | ORAL_CAPSULE | Freq: Two times a day (BID) | ORAL | 1 refills | Status: DC

## 2022-09-10 ENCOUNTER — Encounter (HOSPITAL_BASED_OUTPATIENT_CLINIC_OR_DEPARTMENT_OTHER): Payer: Self-pay | Admitting: Orthopaedic Surgery

## 2022-09-13 ENCOUNTER — Ambulatory Visit (INDEPENDENT_AMBULATORY_CARE_PROVIDER_SITE_OTHER): Payer: 59 | Admitting: Student

## 2022-09-13 ENCOUNTER — Encounter: Payer: 59 | Admitting: Physical Therapy

## 2022-09-13 ENCOUNTER — Ambulatory Visit (INDEPENDENT_AMBULATORY_CARE_PROVIDER_SITE_OTHER): Payer: 59

## 2022-09-13 ENCOUNTER — Encounter (HOSPITAL_BASED_OUTPATIENT_CLINIC_OR_DEPARTMENT_OTHER): Payer: Self-pay | Admitting: Student

## 2022-09-13 DIAGNOSIS — M25511 Pain in right shoulder: Secondary | ICD-10-CM | POA: Diagnosis not present

## 2022-09-13 DIAGNOSIS — M25572 Pain in left ankle and joints of left foot: Secondary | ICD-10-CM | POA: Diagnosis not present

## 2022-09-13 DIAGNOSIS — S93402A Sprain of unspecified ligament of left ankle, initial encounter: Secondary | ICD-10-CM

## 2022-09-13 DIAGNOSIS — Z96611 Presence of right artificial shoulder joint: Secondary | ICD-10-CM | POA: Diagnosis not present

## 2022-09-13 NOTE — Progress Notes (Signed)
Chief Complaint: Right shoulder and left ankle pain     History of Present Illness:    Anna Keller is a 52 y.o. female presents today for evaluation of her right shoulder and left ankle after a fall she sustained 3 days ago.  She was going down the steps in front of her house when her left knee gave out, causing her to fall.  Denies hitting her head or loss of consciousness.  Since the fall, her right shoulder has been flared up and she wants to be sure that her reverse shoulder replacement was not damaged.  She has also had lateral left ankle pain, bruising, and swelling.  She has been wearing an ankle brace and has been using ice with little relief.  Pain levels are overall moderate.   Surgical History:   Reverse right shoulder arthroplasty x3  PMH/PSH/Family History/Social History/Meds/Allergies:    Past Medical History:  Diagnosis Date   ADHD (attention deficit hyperactivity disorder)    Ankylosing spondylitis (HCC)    Anxiety    Arthritis    Depression    Eczema    Family history of adverse reaction to anesthesia    dad would be crazy, talk out of his head-per pt   Headache    Rheumatoid arthritis (HCC)    Past Surgical History:  Procedure Laterality Date   BLADDER SUSPENSION N/A 04/14/2021   Procedure: TRANSVAGINAL TAPE (TVT) PROCEDURE;  Surgeon: Essie Hart, MD;  [facility];  Service: Gynecology;  Laterality: N/A;  GYNCARE TVT EXACT   CYSTOSCOPY N/A 04/14/2021   Procedure: CYSTOSCOPY;  Surgeon: Essie Hart, MD;  [facility];  Service: Gynecology;  Laterality: N/A;   KNEE ARTHROSCOPY Left 1988   REVERSE SHOULDER ARTHROPLASTY Right 01/05/2022   Procedure: RIGHT SHOULDER REVERSE SHOULDER ARTHROPLASTY;  Surgeon: Huel Cote, MD;  [facility];  Service: Orthopedics;  Laterality: Right;   REVERSE SHOULDER ARTHROPLASTY Right 04/01/2022   Procedure: RIGHT REVERSE SHOULDER ARTHROPLASTY REVISION;  Surgeon: Huel Cote, MD;  [facility];  Service: Orthopedics;  Laterality: Right;   REVERSE SHOULDER ARTHROPLASTY Right 07/22/2022   Procedure: RIGHT SHOULDER REVISION  ARTHROPLASTY;  Surgeon: Huel Cote, MD;  [facility];  Service: Orthopedics;  Laterality: Right;   TONSILLECTOMY     as a child   TOTAL LAPAROSCOPIC HYSTERECTOMY WITH SALPINGECTOMY Bilateral 04/14/2021   Procedure: TOTAL LAPAROSCOPIC HYSTERECTOMY WITH BILATERAL SALPINGECTOMY AND BILATERAL OOPHERECTOMY;  Surgeon: Essie Hart, MD;  [facility];  Service: Gynecology;  Laterality: Bilateral;   Social History   Socioeconomic History   Marital status: Married    Spouse name: Casimiro Needle   Number of children: 1   Years of education: 12   Highest education level: High school graduate  Occupational History   Not on file  Tobacco Use   Smoking status: Former    Packs/day: 1.00    Years: 25.00    Additional pack years: 0.00    Total pack years: 25.00    Types: Cigarettes    Quit date: 01/09/2016    Years since quitting: 6.6   Smokeless tobacco: Never  Vaping Use   Vaping Use: Never used  Substance and Sexual Activity   Alcohol use: Not Currently   Drug use: No   Sexual activity: Yes    Partners: Male  Birth control/protection: None  Other Topics Concern   Not on file  Social History Narrative   Not on file   Social Determinants of Health   Financial Resource Strain: Low Risk  (07/14/2018)   Overall Financial Resource Strain (CARDIA)    Difficulty of Paying Living Expenses: Not hard at all  Food Insecurity: No Food Insecurity (07/14/2018)   Hunger Vital Sign    Worried About Running Out of Food in the Last Year: Never true    Ran Out of Food in the Last Year: Never true  Transportation Needs: No Transportation Needs (04/02/2022)   PRAPARE - Administrator, Civil Service (Medical): No    Lack of Transportation (Non-Medical): No  Physical Activity: Inactive (07/14/2018)   Exercise Vital Sign    Days of  Exercise per Week: 0 days    Minutes of Exercise per Session: 0 min  Stress: No Stress Concern Present (07/14/2018)   [employer] of Occupational Health - Occupational Stress Questionnaire    Feeling of Stress : Only a little  Social Connections: Somewhat Isolated (07/14/2018)   Social Connection and Isolation Panel [NHANES]    Frequency of Communication with Friends and Family: More than three times a week    Frequency of Social Gatherings with Friends and Family: More than three times a week    Attends Religious Services: Never    Database administrator or Organizations: No    Attends Engineer, structural: Never    Marital Status: Married   Family History  Problem Relation Age of Onset   Hypertension Mother    Arthritis Father    Heart disease Sister    Pulmonary Hypertension Sister    COPD Sister    Diabetes Sister    Breast cancer Maternal Grandmother    Heart attack Paternal Grandmother    Breast cancer Maternal Aunt    Breast cancer Cousin    No Known Allergies Current Outpatient Medications  Medication Sig Dispense Refill   ALPRAZolam (XANAX) 1 MG tablet Take 1 tablet (1 mg total) by mouth 3 (three) times daily as needed for anxiety. 90 tablet 1   amphetamine-dextroamphetamine (ADDERALL) 20 MG tablet Take 1 tablet (20 mg total) by mouth 2 (two) times daily. 60 tablet 0   aspirin EC 325 MG tablet Take 1 tablet (325 mg total) by mouth daily. 30 tablet 0   Calcium Carb-Cholecalciferol (CALCIUM 600+D3 PO) Take 1 tablet by mouth 3 (three) times daily.     cholecalciferol (VITAMIN D3) 25 MCG (1000 UNIT) tablet Take 1,000 Units by mouth daily.     clobetasol ointment (TEMOVATE) 0.05 % Apply topically 2 (two) times daily.     diclofenac (VOLTAREN) 50 MG EC tablet Take 50 mg by mouth 2 (two) times daily.     doxepin (SINEQUAN) 25 MG capsule Take 1 capsule (25 mg total) by mouth at bedtime. 30 capsule 1   FLUoxetine (PROZAC) 40 MG capsule Take 1 capsule (40 mg total)  by mouth daily. Take 1 capsule by mouth twice daily (please d/c 60 mg) 180 capsule 3   HUMIRA, 2 PEN, 40 MG/0.8ML PNKT Inject 40 mg into the skin every 14 (fourteen) days.     levocetirizine (XYZAL) 5 MG tablet Take 5 mg by mouth every evening.     Multiple Vitamins-Minerals (MULTIVITAMIN WITH MINERALS) tablet Take 1 tablet by mouth daily.     omeprazole (PRILOSEC) 20 MG capsule Take 1 capsule by mouth once daily 90 capsule 0  polyethylene glycol powder (GLYCOLAX/MIRALAX) 17 GM/SCOOP powder Take 17 g by mouth daily. (Patient taking differently: Take 17 g by mouth daily as needed for mild constipation or moderate constipation.) 3350 g 3   rosuvastatin (CRESTOR) 40 MG tablet TAKE 1 TABLET BY MOUTH ONCE EVERY EVENING 30 tablet 26   traMADol (ULTRAM) 50 MG tablet Take 2 tablets (100 mg total) by mouth every 12 (twelve) hours as needed. 30 tablet 2   venlafaxine XR (EFFEXOR-XR) 150 MG 24 hr capsule Take 1 capsule (150 mg total) by mouth 2 (two) times daily. 180 capsule 1   No current facility-administered medications for this visit.   No results found.  Review of Systems:   A ROS was performed including pertinent positives and negatives as documented in the HPI.  Physical Exam :   Constitutional: NAD and appears stated age Neurological: Alert and oriented Psych: Appropriate affect and cooperative Last menstrual period 03/24/2021.   Comprehensive Musculoskeletal Exam:    Right shoulder incision is well-appearing without erythema or drainage.  She does have a small protruding suture at the distal incision.  She does have some tenderness to palpation over the anterior shoulder.  Passive shoulder forward flexion to about 45 degrees.  Left ankle has mild to moderate edema with ecchymosis noted below the lateral malleolus.  Active range of motion 20 degrees plantarflexion and 10 degrees dorsiflexion.  Tenderness over lateral malleolus and ATFL distribution.  Negative anterior drawer and talar tilt.   DP/TP pulses 2+.  Distal sensation intact.  Imaging:   Xray (left ankle 3 views): Negative  Xray (right shoulder 3 views): Unchanged reverse shoulder arthroplasty  I personally reviewed and interpreted the radiographs.   Assessment:   52 y.o. female with right shoulder and left ankle pain after sustaining a fall.  Shoulder x-rays demonstrated no damage or changes to the replacement components.  Her ankle injury appears consistent with a lateral ankle sprain, however her ankle mortise remains stable.  Encouraged continued use of the ankle brace for added stability and to also continue icing to reduce inflammation.  Can weight-bear as tolerated.  She is scheduled for consultation with Dr. Magnus Ivan later this month regarding possible knee replacement as her knee has given out now multiple times.  Will plan to have her return to clinic as needed  Plan :    -Return to clinic as needed     I personally saw and evaluated the patient, and participated in the management and treatment plan.  Hazle Nordmann, PA-C Orthopedics  This document was dictated using Conservation officer, historic buildings. A reasonable attempt at proof reading has been made to minimize errors.

## 2022-09-14 NOTE — Addendum Note (Signed)
Addended by: Kerby Less A on: 09/14/2022 09:19 AM   Modules accepted: Orders

## 2022-09-15 ENCOUNTER — Other Ambulatory Visit (HOSPITAL_BASED_OUTPATIENT_CLINIC_OR_DEPARTMENT_OTHER): Payer: Self-pay | Admitting: Orthopaedic Surgery

## 2022-09-15 ENCOUNTER — Encounter: Payer: Self-pay | Admitting: Urology

## 2022-09-15 ENCOUNTER — Encounter (HOSPITAL_BASED_OUTPATIENT_CLINIC_OR_DEPARTMENT_OTHER): Payer: 59 | Admitting: Orthopaedic Surgery

## 2022-09-15 ENCOUNTER — Encounter (HOSPITAL_BASED_OUTPATIENT_CLINIC_OR_DEPARTMENT_OTHER): Payer: Self-pay | Admitting: Orthopaedic Surgery

## 2022-09-15 ENCOUNTER — Other Ambulatory Visit: Payer: Self-pay | Admitting: *Deleted

## 2022-09-15 ENCOUNTER — Encounter: Payer: 59 | Admitting: Physical Therapy

## 2022-09-15 DIAGNOSIS — Z96611 Presence of right artificial shoulder joint: Secondary | ICD-10-CM

## 2022-09-15 MED ORDER — GEMTESA 75 MG PO TABS: 75.0000 mg | ORAL_TABLET | Freq: Every day | ORAL | 3 refills | Status: DC

## 2022-09-17 ENCOUNTER — Encounter (HOSPITAL_BASED_OUTPATIENT_CLINIC_OR_DEPARTMENT_OTHER): Payer: Self-pay | Admitting: Orthopaedic Surgery

## 2022-09-20 ENCOUNTER — Encounter: Payer: 59 | Admitting: Physical Therapy

## 2022-09-21 DIAGNOSIS — M0609 Rheumatoid arthritis without rheumatoid factor, multiple sites: Secondary | ICD-10-CM | POA: Diagnosis not present

## 2022-09-22 ENCOUNTER — Encounter: Payer: 59 | Admitting: Physical Therapy

## 2022-09-28 ENCOUNTER — Encounter: Payer: 59 | Admitting: Physical Therapy

## 2022-09-29 DIAGNOSIS — Z79899 Other long term (current) drug therapy: Secondary | ICD-10-CM | POA: Diagnosis not present

## 2022-09-29 DIAGNOSIS — R531 Weakness: Secondary | ICD-10-CM | POA: Diagnosis not present

## 2022-09-29 DIAGNOSIS — Z90710 Acquired absence of both cervix and uterus: Secondary | ICD-10-CM | POA: Diagnosis not present

## 2022-09-29 DIAGNOSIS — G5690 Unspecified mononeuropathy of unspecified upper limb: Secondary | ICD-10-CM | POA: Diagnosis not present

## 2022-09-29 DIAGNOSIS — M25562 Pain in left knee: Secondary | ICD-10-CM | POA: Diagnosis not present

## 2022-09-29 DIAGNOSIS — M25511 Pain in right shoulder: Secondary | ICD-10-CM | POA: Diagnosis not present

## 2022-09-29 DIAGNOSIS — Z87891 Personal history of nicotine dependence: Secondary | ICD-10-CM | POA: Diagnosis not present

## 2022-09-29 DIAGNOSIS — W19XXXD Unspecified fall, subsequent encounter: Secondary | ICD-10-CM | POA: Diagnosis not present

## 2022-09-30 ENCOUNTER — Ambulatory Visit (INDEPENDENT_AMBULATORY_CARE_PROVIDER_SITE_OTHER): Payer: 59 | Admitting: Orthopaedic Surgery

## 2022-09-30 ENCOUNTER — Encounter: Payer: 59 | Admitting: Physical Therapy

## 2022-09-30 VITALS — Ht 66.0 in | Wt 298.0 lb

## 2022-09-30 DIAGNOSIS — M25562 Pain in left knee: Secondary | ICD-10-CM

## 2022-09-30 DIAGNOSIS — G8929 Other chronic pain: Secondary | ICD-10-CM

## 2022-09-30 DIAGNOSIS — M1712 Unilateral primary osteoarthritis, left knee: Secondary | ICD-10-CM | POA: Diagnosis not present

## 2022-09-30 NOTE — Progress Notes (Signed)
The patient is a 52 year old female that I saw once last year with bilateral knee pain with the left much worse than the right.  She does have some arthritis in that knee on the left side and has had multiple falls.  After I saw her I sent her to my partner Dr. Steward Drone who performed a reverse shoulder arthroplasty on her right shoulder due to severe arthritis.  Unfortunately that has failed twice and some of this may have been due to mechanical fall.  She was seen at [facility] yesterday for another opinion when she was sent to a shoulder specialist there.  They have recommended right upper extremity nerve conduction studies.  She was to schedule this they did but [facility] is booked out about 3 months.  We can see if we get this scheduled through Dr. Alvester Morin to look at the nerves of her right upper extremity which I assume is likely the axillary nerve among other nerves due to her muscle issues and pain issues as a relates to that shoulder.  She is seeing me today for her left knee.  She says the knee keeps giving out on her and she keeps falling and that is what is affecting her shoulder.  She is 52 years old.  Unfortunately her BMI today is 48.  She is also insured through [facility].  Examination of the left knee she has no effusion but it is painful throughout the arc of motion of the knee.  X-rays from a year ago shows joint space narrowing.  It is hard to get the ligaments exam on her knee secondary to her pain and the size of her leg.  I would like to send her for an MRI of that left knee to evaluate the cartilage and to look for meniscal tear or ligamentous injury given her multiple falls stating the knees continues to give out on her.  We will see her back in follow-up after the MRI of her left knee.  The nerve conduction/EMG studies can be forwarded to Dr. Steward Drone.

## 2022-10-01 ENCOUNTER — Other Ambulatory Visit: Payer: Self-pay

## 2022-10-01 DIAGNOSIS — G8929 Other chronic pain: Secondary | ICD-10-CM

## 2022-10-01 DIAGNOSIS — M1712 Unilateral primary osteoarthritis, left knee: Secondary | ICD-10-CM

## 2022-10-04 ENCOUNTER — Encounter: Payer: 59 | Admitting: Physical Therapy

## 2022-10-05 ENCOUNTER — Encounter: Payer: Self-pay | Admitting: Orthopaedic Surgery

## 2022-10-05 DIAGNOSIS — M0609 Rheumatoid arthritis without rheumatoid factor, multiple sites: Secondary | ICD-10-CM | POA: Diagnosis not present

## 2022-10-06 ENCOUNTER — Encounter: Payer: 59 | Admitting: Physical Therapy

## 2022-10-06 DIAGNOSIS — G8929 Other chronic pain: Secondary | ICD-10-CM | POA: Diagnosis not present

## 2022-10-06 DIAGNOSIS — M25511 Pain in right shoulder: Secondary | ICD-10-CM | POA: Diagnosis not present

## 2022-10-07 ENCOUNTER — Encounter: Payer: Self-pay | Admitting: *Deleted

## 2022-10-07 ENCOUNTER — Telehealth: Payer: Self-pay | Admitting: Urology

## 2022-10-07 MED ORDER — SOLIFENACIN SUCCINATE 10 MG PO TABS: 10.0000 mg | ORAL_TABLET | Freq: Every day | ORAL | 2 refills | Status: DC

## 2022-10-07 NOTE — Telephone Encounter (Signed)
Rx Solifenacin sent to pharmacy

## 2022-10-08 ENCOUNTER — Ambulatory Visit (INDEPENDENT_AMBULATORY_CARE_PROVIDER_SITE_OTHER): Payer: 59 | Admitting: Physical Medicine and Rehabilitation

## 2022-10-08 DIAGNOSIS — T84028A Dislocation of other internal joint prosthesis, initial encounter: Secondary | ICD-10-CM | POA: Diagnosis not present

## 2022-10-08 DIAGNOSIS — G8929 Other chronic pain: Secondary | ICD-10-CM

## 2022-10-08 DIAGNOSIS — M25511 Pain in right shoulder: Secondary | ICD-10-CM

## 2022-10-08 DIAGNOSIS — R202 Paresthesia of skin: Secondary | ICD-10-CM | POA: Diagnosis not present

## 2022-10-08 DIAGNOSIS — R29898 Other symptoms and signs involving the musculoskeletal system: Secondary | ICD-10-CM

## 2022-10-08 DIAGNOSIS — Z96611 Presence of right artificial shoulder joint: Secondary | ICD-10-CM | POA: Diagnosis not present

## 2022-10-08 NOTE — Procedures (Unsigned)
EMG & NCV Findings: Evaluation of the right ulnar motor nerve showed decreased conduction velocity (B Elbow-Wrist, 49 m/s).  All remaining nerves (as indicated in the following tables) were within normal limits.    All examined muscles (as indicated in the following table) showed no evidence of electrical instability.    Impression: Essentially NORMAL electrodiagnostic study of the right upper limb.  There is no significant electrodiagnostic evidence of nerve entrapment (in particular axillary nerve), brachial plexopathy or cervical radiculopathy.    As you know, purely sensory or demyelinating radiculopathies and chemical radiculitis may not be detected with this particular electrodiagnostic study.  Recommendations: 1.  Follow-up with referring physician.  Pain and dysfunction is still likely mechanical due to the shoulder. 2.  Continue current management of symptoms.  ___________________________ Naaman Plummer FAAPMR Board Certified, American Board of Physical Medicine and Rehabilitation    Nerve Conduction Studies Anti Sensory Summary Table   Stim Site NR Peak (ms) Norm Peak (ms) P-T Amp (V) Norm P-T Amp Site1 Site2 Delta-P (ms) Dist (cm) Vel (m/s) Norm Vel (m/s)  Right Median Acr Palm Anti Sensory (2nd Digit)  30.8C  Wrist    3.3 <3.6 38.4 >10 Wrist Palm 1.7 0.0    Palm    1.6 <2.0 21.1         Right Radial Anti Sensory (Base 1st Digit)  31.9C  Wrist    1.8 <3.1 18.9  Wrist Base 1st Digit 1.8 0.0    Right Ulnar Anti Sensory (5th Digit)  32.3C  Wrist    2.9 <3.7 16.9 >15.0 Wrist 5th Digit 2.9 14.0 48 >38   Motor Summary Table   Stim Site NR Onset (ms) Norm Onset (ms) O-P Amp (mV) Norm O-P Amp Site1 Site2 Delta-0 (ms) Dist (cm) Vel (m/s) Norm Vel (m/s)  Right Axillary Motor (Deltoid)  32.8C  Clavicle    4.8 <5 2.2         Right Median Motor (Abd Poll Brev)  32.4C  Wrist    3.2 <4.2 6.8 >5 Elbow Wrist 3.8 20.0 53 >50  Elbow    7.0  7.4         Right Ulnar Motor (Abd Dig  Min)  32.8C  Wrist    2.7 <4.2 7.7 >3 B Elbow Wrist 3.9 19.0 *49 >53  B Elbow    6.6  4.4  A Elbow B Elbow 1.1 10.0 91 >53  A Elbow    7.7  7.7          EMG   Side Muscle Nerve Root Ins Act Fibs Psw Amp Dur Poly Recrt Int Dennie Bible Comment  Right 1stDorInt Ulnar C8-T1 Nml Nml Nml Nml Nml 0 Nml Nml   Right Triceps Radial C6-7-8 Nml Nml Nml Nml Nml 0 Nml Nml   Right Biceps Musc C5-6 Nml Nml Nml Nml Nml 0 Nml Nml   Right Deltoid Axillary C5-6 Nml Nml Nml Nml Nml 0 Nml Nml   Right Ext Digitorum  Radial (Post Int) C7-8 Nml Nml Nml Nml Nml 0 Nml Nml   Right Supraspinatus SupraScap C5-6 Nml Nml Nml Nml Nml 0 Nml Nml    Paraspinal EMG   Side Muscle Nerve Root Ins Act Fibs Psw  Right Low Cervical Rami  Nml Nml Nml    Nerve Conduction Studies Anti Sensory Left/Right Comparison   Stim Site L Lat (ms) R Lat (ms) L-R Lat (ms) L Amp (V) R Amp (V) L-R Amp (%) Site1 Site2 L Vel (m/s) R Vel (  m/s) L-R Vel (m/s)  Median Acr Palm Anti Sensory (2nd Digit)  30.8C  Wrist  3.3   38.4  Wrist Palm     Palm  1.6   21.1        Radial Anti Sensory (Base 1st Digit)  31.9C  Wrist  1.8   18.9  Wrist Base 1st Digit     Ulnar Anti Sensory (5th Digit)  32.3C  Wrist  2.9   16.9  Wrist 5th Digit  48    Motor Left/Right Comparison   Stim Site L Lat (ms) R Lat (ms) L-R Lat (ms) L Amp (mV) R Amp (mV) L-R Amp (%) Site1 Site2 L Vel (m/s) R Vel (m/s) L-R Vel (m/s)  Axillary Motor (Deltoid)  32.8C  Clavicle  4.8   2.2        Median Motor (Abd Poll Brev)  32.4C  Wrist  3.2   6.8  Elbow Wrist  53   Elbow  7.0   7.4        Ulnar Motor (Abd Dig Min)  32.8C  Wrist  2.7   7.7  B Elbow Wrist  *49   B Elbow  6.6   4.4  A Elbow B Elbow  91   A Elbow  7.7   7.7           Waveforms:

## 2022-10-08 NOTE — Progress Notes (Unsigned)
Functional Pain Scale - descriptive words and definitions  Intense (8)    Cannot complete any ADLs without much assistance/cannot concentrate/conversation is difficult/unable to sleep and unable to use distraction. Severe range order  Average Pain 9  Right handed. Pain and weakness in right arm, mainly in upper arm

## 2022-10-08 NOTE — Progress Notes (Unsigned)
Anna Keller - 52 y.o. female MRN [id number]  Date of birth: 1971-04-16  Office Visit Note: Visit Date: 10/08/2022 PCP: Orson Eva, NP Referred by: Huel Cote, MD  Subjective: Chief Complaint  Patient presents with   Right Shoulder - Pain, Weakness   Right Arm - Pain, Weakness   HPI: Anna Keller is a 52 y.o. female who comes in todayHPI    ROS Otherwise per HPI.  Assessment & Plan: Visit Diagnoses:    ICD-10-CM   1. Paresthesia of skin  R20.2 NCV with EMG (electromyography)    2. Chronic right shoulder pain  M25.511    G89.29     3. 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 History of reverse total replacement of right shoulder joint  Z96.611     5. Weakness of right shoulder  R29.898        Plan: No additional findings.   Meds & Orders: No orders of the defined types were placed in this encounter.   Orders Placed This Encounter  Procedures   NCV with EMG (electromyography)    Follow-up: No follow-ups on file.   Procedures: No procedures performed  EMG & NCV Findings: Evaluation of the right ulnar motor nerve showed decreased conduction velocity (B Elbow-Wrist, 49 m/s).  All remaining nerves (as indicated in the following tables) were within normal limits.    All examined muscles (as indicated in the following table) showed no evidence of electrical instability.    Impression: Essentially NORMAL electrodiagnostic study of the right upper limb.  There is no significant electrodiagnostic evidence of nerve entrapment (in particular axillary nerve), brachial plexopathy or cervical radiculopathy.    As you know, purely sensory or demyelinating radiculopathies and chemical radiculitis may not be detected with this particular electrodiagnostic study.  Recommendations: 1.  Follow-up with referring physician.  Pain and dysfunction is still likely mechanical Duda of the shoulder. 2.   Continue current management of symptoms.  ___________________________ Naaman Plummer FAAPMR Board Certified, American Board of Physical Medicine and Rehabilitation    Nerve Conduction Studies Anti Sensory Summary Table   Stim Site NR Peak (ms) Norm Peak (ms) P-T Amp (V) Norm P-T Amp Site1 Site2 Delta-P (ms) Dist (cm) Vel (m/s) Norm Vel (m/s)  Right Median Acr Palm Anti Sensory (2nd Digit)  30.8C  Wrist    3.3 <3.6 38.4 >10 Wrist Palm 1.7 0.0    Palm    1.6 <2.0 21.1         Right Radial Anti Sensory (Base 1st Digit)  31.9C  Wrist    1.8 <3.1 18.9  Wrist Base 1st Digit 1.8 0.0    Right Ulnar Anti Sensory (5th Digit)  32.3C  Wrist    2.9 <3.7 16.9 >15.0 Wrist 5th Digit 2.9 14.0 48 >38   Motor Summary Table   Stim Site NR Onset (ms) Norm Onset (ms) O-P Amp (mV) Norm O-P Amp Site1 Site2 Delta-0 (ms) Dist (cm) Vel (m/s) Norm Vel (m/s)  Right Axillary Motor (Deltoid)  32.8C  Clavicle    4.8 <5 2.2         Right Median Motor (Abd Poll Brev)  32.4C  Wrist    3.2 <4.2 6.8 >5 Elbow Wrist 3.8 20.0 53 >50  Elbow    7.0  7.4         Right Ulnar Motor (Abd Dig Min)  32.8C  Wrist    2.7 <  4.2 7.7 >3 B Elbow Wrist 3.9 19.0 *49 >53  B Elbow    6.6  4.4  A Elbow B Elbow 1.1 10.0 91 >53  A Elbow    7.7  7.7          EMG   Side Muscle Nerve Root Ins Act Fibs Psw Amp Dur Poly Recrt Int Dennie Bible Comment  Right 1stDorInt Ulnar C8-T1 Nml Nml Nml Nml Nml 0 Nml Nml   Right Triceps Radial C6-7-8 Nml Nml Nml Nml Nml 0 Nml Nml   Right Biceps Musc C5-6 Nml Nml Nml Nml Nml 0 Nml Nml   Right Deltoid Axillary C5-6 Nml Nml Nml Nml Nml 0 Nml Nml   Right Ext Digitorum  Radial (Post Int) C7-8 Nml Nml Nml Nml Nml 0 Nml Nml   Right Supraspinatus SupraScap C5-6 Nml Nml Nml Nml Nml 0 Nml Nml    Paraspinal EMG   Side Muscle Nerve Root Ins Act Fibs Psw  Right Low Cervical Rami  Nml Nml Nml    Nerve Conduction Studies Anti Sensory Left/Right Comparison   Stim Site L Lat (ms) R Lat (ms) L-R Lat (ms) L Amp (V)  R Amp (V) L-R Amp (%) Site1 Site2 L Vel (m/s) R Vel (m/s) L-R Vel (m/s)  Median Acr Palm Anti Sensory (2nd Digit)  30.8C  Wrist  3.3   38.4  Wrist Palm     Palm  1.6   21.1        Radial Anti Sensory (Base 1st Digit)  31.9C  Wrist  1.8   18.9  Wrist Base 1st Digit     Ulnar Anti Sensory (5th Digit)  32.3C  Wrist  2.9   16.9  Wrist 5th Digit  48    Motor Left/Right Comparison   Stim Site L Lat (ms) R Lat (ms) L-R Lat (ms) L Amp (mV) R Amp (mV) L-R Amp (%) Site1 Site2 L Vel (m/s) R Vel (m/s) L-R Vel (m/s)  Axillary Motor (Deltoid)  32.8C  Clavicle  4.8   2.2        Median Motor (Abd Poll Brev)  32.4C  Wrist  3.2   6.8  Elbow Wrist  53   Elbow  7.0   7.4        Ulnar Motor (Abd Dig Min)  32.8C  Wrist  2.7   7.7  B Elbow Wrist  *49   B Elbow  6.6   4.4  A Elbow B Elbow  91   A Elbow  7.7   7.7           Waveforms:              Clinical History: No specialty comments available.   She reports that she quit smoking about 6 years ago. Her smoking use included cigarettes. She has a 25.00 pack-year smoking history. She has never used smokeless tobacco. No results for input(s): "HGBA1C", "LABURIC" in the last 8760 hours.  Objective:  VS:  HT:    WT:   BMI:     BP:   HR: bpm  TEMP: ( )  RESP:  Physical Exam  Ortho Exam  Imaging: No results found.  Past Medical/Family/Surgical/Social History: Medications & Allergies reviewed per EMR, new medications updated. 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) 05/05/2022   History of reverse  total replacement of right shoulder joint 04/01/2022   Unilateral primary osteoarthritis, left knee 01/27/2022   Traumatic complete tear of right rotator cuff    S/P laparoscopic hysterectomy 04/14/2021   Grief reaction 03/23/2019   Gastroesophageal reflux disease without  esophagitis 03/23/2019   Constipation 03/23/2019   Eczema 03/23/2019   Pain in joint, multiple sites 07/14/2018   Family history of arthritis 07/14/2018   Peeling skin 07/14/2018   Class 3 severe obesity due to excess calories with body mass index (BMI) of 40.0 to 44.9 in adult [facility]) 07/14/2018   Anxiety 07/14/2018   Depression 07/14/2018   Past Medical History:  Diagnosis Date   ADHD (attention deficit hyperactivity disorder)    Ankylosing spondylitis (HCC)    Anxiety    Arthritis    Depression    Eczema    Family history of adverse reaction to anesthesia    dad would be crazy, talk out of his head-per pt   Headache    Rheumatoid arthritis (HCC)    Family History  Problem Relation Age of Onset   Hypertension Mother    Arthritis Father    Heart disease Sister    Pulmonary Hypertension Sister    COPD Sister    Diabetes Sister    Breast cancer Maternal Grandmother    Heart attack Paternal Grandmother    Breast cancer Maternal Aunt    Breast cancer Cousin    Past Surgical History:  Procedure Laterality Date   BLADDER SUSPENSION N/A 04/14/2021   Procedure: TRANSVAGINAL TAPE (TVT) PROCEDURE;  Surgeon: Essie Hart, MD;  [facility]: [facility];  Service: Gynecology;  Laterality: N/A;  GYNCARE TVT EXACT   CYSTOSCOPY N/A 04/14/2021   Procedure: CYSTOSCOPY;  Surgeon: Essie Hart, MD;  Location: [facility];  Service: Gynecology;  Laterality: N/A;   KNEE ARTHROSCOPY Left 1988   REVERSE SHOULDER ARTHROPLASTY Right 01/05/2022   Procedure: RIGHT SHOULDER REVERSE SHOULDER ARTHROPLASTY;  Surgeon: Huel Cote, MD;  Location: [facility];  Service: Orthopedics;  Laterality: Right;   REVERSE SHOULDER ARTHROPLASTY Right 04/01/2022   Procedure: RIGHT REVERSE SHOULDER ARTHROPLASTY REVISION;  Surgeon: Huel Cote, MD;  [facility]: [facility];  Service: Orthopedics;  Laterality: Right;   REVERSE SHOULDER ARTHROPLASTY Right 07/22/2022   Procedure: RIGHT SHOULDER REVISION  ARTHROPLASTY;  Surgeon: Huel Cote,  MD;  Location: [facility];  Service: Orthopedics;  Laterality: Right;   TONSILLECTOMY     as a child   TOTAL LAPAROSCOPIC HYSTERECTOMY WITH SALPINGECTOMY Bilateral 04/14/2021   Procedure: TOTAL LAPAROSCOPIC HYSTERECTOMY WITH BILATERAL SALPINGECTOMY AND BILATERAL OOPHERECTOMY;  Surgeon: Essie Hart, MD;  Location: [facility];  Service: Gynecology;  Laterality: Bilateral;   Social History   Occupational History   Not on file  Tobacco Use   Smoking status: Former    Packs/day: 1.00    Years: 25.00    Additional pack years: 0.00    Total pack years: 25.00    Types: Cigarettes    Quit date: 01/09/2016    Years since quitting: 6.7   Smokeless tobacco: Never  Vaping Use   Vaping Use: Never used  Substance and Sexual Activity   Alcohol use: Not Currently   Drug use: No   Sexual activity: Yes    Partners: Male    Birth control/protection: None

## 2022-10-11 ENCOUNTER — Encounter: Payer: 59 | Admitting: Physical Therapy

## 2022-10-13 ENCOUNTER — Encounter: Payer: 59 | Admitting: Physical Therapy

## 2022-10-18 ENCOUNTER — Encounter: Payer: Self-pay | Admitting: Nurse Practitioner

## 2022-10-18 ENCOUNTER — Other Ambulatory Visit: Payer: Self-pay

## 2022-10-18 ENCOUNTER — Encounter: Payer: 59 | Admitting: Physical Therapy

## 2022-10-18 ENCOUNTER — Encounter: Payer: Self-pay | Admitting: Orthopaedic Surgery

## 2022-10-18 ENCOUNTER — Inpatient Hospital Stay: Admission: RE | Admit: 2022-10-18 | Payer: 59 | Source: Ambulatory Visit

## 2022-10-18 DIAGNOSIS — F988 Other specified behavioral and emotional disorders with onset usually occurring in childhood and adolescence: Secondary | ICD-10-CM

## 2022-10-18 DIAGNOSIS — F419 Anxiety disorder, unspecified: Secondary | ICD-10-CM

## 2022-10-18 DIAGNOSIS — M1712 Unilateral primary osteoarthritis, left knee: Secondary | ICD-10-CM

## 2022-10-19 ENCOUNTER — Encounter: Payer: Self-pay | Admitting: Nurse Practitioner

## 2022-10-19 ENCOUNTER — Other Ambulatory Visit: Payer: Self-pay | Admitting: Nurse Practitioner

## 2022-10-19 DIAGNOSIS — G8929 Other chronic pain: Secondary | ICD-10-CM

## 2022-10-19 MED ORDER — ALPRAZOLAM 1 MG PO TABS
1.0000 mg | ORAL_TABLET | Freq: Three times a day (TID) | ORAL | 1 refills | Status: DC | PRN
Start: 2022-10-19 — End: 2023-01-04

## 2022-10-19 MED ORDER — TRAMADOL HCL 50 MG PO TABS
100.0000 mg | ORAL_TABLET | Freq: Two times a day (BID) | ORAL | 2 refills | Status: DC | PRN
Start: 2022-10-19 — End: 2022-11-22

## 2022-10-19 MED ORDER — AMPHETAMINE-DEXTROAMPHETAMINE 20 MG PO TABS: 20.0000 mg | ORAL_TABLET | Freq: Two times a day (BID) | ORAL | 0 refills | Status: DC

## 2022-10-20 ENCOUNTER — Encounter: Payer: 59 | Admitting: Physical Therapy

## 2022-10-22 DIAGNOSIS — Z96619 Presence of unspecified artificial shoulder joint: Secondary | ICD-10-CM | POA: Diagnosis not present

## 2022-10-22 DIAGNOSIS — M25511 Pain in right shoulder: Secondary | ICD-10-CM | POA: Diagnosis not present

## 2022-10-22 DIAGNOSIS — T8484XD Pain due to internal orthopedic prosthetic devices, implants and grafts, subsequent encounter: Secondary | ICD-10-CM | POA: Diagnosis not present

## 2022-10-22 DIAGNOSIS — G8929 Other chronic pain: Secondary | ICD-10-CM | POA: Diagnosis not present

## 2022-10-25 ENCOUNTER — Encounter: Payer: 59 | Admitting: Physical Therapy

## 2022-10-26 ENCOUNTER — Other Ambulatory Visit: Payer: Self-pay | Admitting: Nurse Practitioner

## 2022-10-26 ENCOUNTER — Other Ambulatory Visit: Payer: 59

## 2022-10-26 DIAGNOSIS — E782 Mixed hyperlipidemia: Secondary | ICD-10-CM | POA: Diagnosis not present

## 2022-10-26 DIAGNOSIS — R5383 Other fatigue: Secondary | ICD-10-CM | POA: Diagnosis not present

## 2022-10-26 DIAGNOSIS — R7303 Prediabetes: Secondary | ICD-10-CM | POA: Diagnosis not present

## 2022-10-27 ENCOUNTER — Encounter: Payer: 59 | Admitting: Physical Therapy

## 2022-10-27 ENCOUNTER — Other Ambulatory Visit: Payer: Self-pay | Admitting: Nurse Practitioner

## 2022-10-27 DIAGNOSIS — G4700 Insomnia, unspecified: Secondary | ICD-10-CM

## 2022-10-27 LAB — COMPREHENSIVE METABOLIC PANEL
ALT: 35 IU/L — ABNORMAL HIGH (ref 0–32)
AST: 22 IU/L (ref 0–40)
Albumin: 4.3 g/dL (ref 3.8–4.9)
Alkaline Phosphatase: 231 IU/L — ABNORMAL HIGH (ref 44–121)
BUN/Creatinine Ratio: 14 (ref 9–23)
BUN: 12 mg/dL (ref 6–24)
Bilirubin Total: 0.3 mg/dL (ref 0.0–1.2)
CO2: 20 mmol/L (ref 20–29)
Calcium: 9.7 mg/dL (ref 8.7–10.2)
Chloride: 105 mmol/L (ref 96–106)
Creatinine, Ser: 0.85 mg/dL (ref 0.57–1.00)
Globulin, Total: 2.4 g/dL (ref 1.5–4.5)
Glucose: 113 mg/dL — ABNORMAL HIGH (ref 70–99)
Potassium: 4.2 mmol/L (ref 3.5–5.2)
Sodium: 143 mmol/L (ref 134–144)
Total Protein: 6.7 g/dL (ref 6.0–8.5)
eGFR: 82 mL/min/{1.73_m2} (ref >59)

## 2022-10-27 LAB — LIPID PANEL W/O CHOL/HDL RATIO
Cholesterol, Total: 145 mg/dL (ref 100–199)
HDL: 50 mg/dL (ref >39)
LDL Chol Calc (NIH): 64 mg/dL (ref 0–99)
Triglycerides: 188 mg/dL — ABNORMAL HIGH (ref 0–149)
VLDL Cholesterol Cal: 31 mg/dL (ref 5–40)

## 2022-10-27 LAB — CBC WITH DIFFERENTIAL/PLATELET
Basophils Absolute: 0.1 10*3/uL (ref 0.0–0.2)
Basos: 1 %
EOS (ABSOLUTE): 0.5 10*3/uL — ABNORMAL HIGH (ref 0.0–0.4)
Eos: 8 %
Hematocrit: 36.8 % (ref 34.0–46.6)
Hemoglobin: 11.9 g/dL (ref 11.1–15.9)
Immature Grans (Abs): 0 10*3/uL (ref 0.0–0.1)
Immature Granulocytes: 0 %
Lymphocytes Absolute: 3 10*3/uL (ref 0.7–3.1)
Lymphs: 46 %
MCH: 27.6 pg (ref 26.6–33.0)
MCHC: 32.3 g/dL (ref 31.5–35.7)
MCV: 85 fL (ref 79–97)
Monocytes Absolute: 0.6 10*3/uL (ref 0.1–0.9)
Monocytes: 9 %
Neutrophils Absolute: 2.3 10*3/uL (ref 1.4–7.0)
Neutrophils: 36 %
Platelets: 315 10*3/uL (ref 150–450)
RBC: 4.31 x10E6/uL (ref 3.77–5.28)
RDW: 14.7 % (ref 11.7–15.4)
WBC: 6.5 10*3/uL (ref 3.4–10.8)

## 2022-10-27 LAB — HGB A1C W/O EAG: Hgb A1c MFr Bld: 6.2 % — ABNORMAL HIGH (ref 4.8–5.6)

## 2022-10-27 LAB — TSH: TSH: 2.14 u[IU]/mL (ref 0.450–4.500)

## 2022-10-28 ENCOUNTER — Encounter: Payer: Self-pay | Admitting: Orthopaedic Surgery

## 2022-11-01 ENCOUNTER — Ambulatory Visit: Payer: 59 | Admitting: Nurse Practitioner

## 2022-11-01 ENCOUNTER — Encounter: Payer: 59 | Admitting: Physical Therapy

## 2022-11-02 ENCOUNTER — Encounter: Payer: Self-pay | Admitting: Internal Medicine

## 2022-11-02 ENCOUNTER — Other Ambulatory Visit: Payer: Self-pay | Admitting: Internal Medicine

## 2022-11-02 ENCOUNTER — Ambulatory Visit (INDEPENDENT_AMBULATORY_CARE_PROVIDER_SITE_OTHER): Payer: 59 | Admitting: Internal Medicine

## 2022-11-02 VITALS — BP 142/82 | HR 85 | Ht 66.0 in | Wt 292.0 lb

## 2022-11-02 DIAGNOSIS — M0609 Rheumatoid arthritis without rheumatoid factor, multiple sites: Secondary | ICD-10-CM | POA: Diagnosis not present

## 2022-11-02 DIAGNOSIS — M2550 Pain in unspecified joint: Secondary | ICD-10-CM | POA: Diagnosis not present

## 2022-11-02 DIAGNOSIS — R7303 Prediabetes: Secondary | ICD-10-CM | POA: Insufficient documentation

## 2022-11-02 DIAGNOSIS — I10 Essential (primary) hypertension: Secondary | ICD-10-CM

## 2022-11-02 DIAGNOSIS — Z1231 Encounter for screening mammogram for malignant neoplasm of breast: Secondary | ICD-10-CM | POA: Diagnosis not present

## 2022-11-02 DIAGNOSIS — M7989 Other specified soft tissue disorders: Secondary | ICD-10-CM | POA: Diagnosis not present

## 2022-11-02 DIAGNOSIS — G8929 Other chronic pain: Secondary | ICD-10-CM | POA: Diagnosis not present

## 2022-11-02 DIAGNOSIS — K219 Gastro-esophageal reflux disease without esophagitis: Secondary | ICD-10-CM

## 2022-11-02 DIAGNOSIS — Z79899 Other long term (current) drug therapy: Secondary | ICD-10-CM | POA: Diagnosis not present

## 2022-11-02 DIAGNOSIS — R21 Rash and other nonspecific skin eruption: Secondary | ICD-10-CM | POA: Diagnosis not present

## 2022-11-02 DIAGNOSIS — M1990 Unspecified osteoarthritis, unspecified site: Secondary | ICD-10-CM | POA: Diagnosis not present

## 2022-11-02 DIAGNOSIS — R748 Abnormal levels of other serum enzymes: Secondary | ICD-10-CM | POA: Insufficient documentation

## 2022-11-02 DIAGNOSIS — M25511 Pain in right shoulder: Secondary | ICD-10-CM

## 2022-11-02 DIAGNOSIS — M8589 Other specified disorders of bone density and structure, multiple sites: Secondary | ICD-10-CM | POA: Diagnosis not present

## 2022-11-02 DIAGNOSIS — M79643 Pain in unspecified hand: Secondary | ICD-10-CM | POA: Diagnosis not present

## 2022-11-02 DIAGNOSIS — Z13820 Encounter for screening for osteoporosis: Secondary | ICD-10-CM | POA: Diagnosis not present

## 2022-11-02 MED ORDER — AMLODIPINE BESYLATE 5 MG PO TABS
5.0000 mg | ORAL_TABLET | Freq: Every day | ORAL | 11 refills | Status: DC
Start: 2022-11-02 — End: 2023-11-02

## 2022-11-02 MED ORDER — TRULICITY 0.75 MG/0.5ML ~~LOC~~ SOAJ
0.7500 mg | SUBCUTANEOUS | 3 refills | Status: DC
Start: 2022-11-02 — End: 2023-04-07

## 2022-11-02 MED ORDER — SEMAGLUTIDE(0.25 OR 0.5MG/DOS) 2 MG/3ML ~~LOC~~ SOPN
0.2500 mg | PEN_INJECTOR | SUBCUTANEOUS | 3 refills | Status: DC
Start: 2022-11-02 — End: 2022-11-02

## 2022-11-02 MED ORDER — MUPIROCIN CALCIUM 2 % EX CREA: 1.0000 | TOPICAL_CREAM | Freq: Two times a day (BID) | CUTANEOUS | 0 refills | Status: DC

## 2022-11-02 NOTE — Progress Notes (Signed)
Established Patient Office Visit  Subjective:  Patient ID: Anna Keller, female    DOB: 12/01/1970  Age: 52 y.o. MRN: [id number]  Chief Complaint  Patient presents with   Follow-up    4 month follow up    Patient comes in for follow-up and to discuss her lab results.  She has been recently showing elevated blood pressure readings.  She agrees to start Norvasc 5 mg p.o. daily.  Patient also has prediabetes and her hemoglobin A1c is at 6.2.  She needs to lose weight in order to get her knee surgery.  Will start Trulicity injections once a week. Patient has chronic right shoulder pain and is getting ready for another surgery. She also needs to schedule a mammogram for this year. Her rheumatoid arthritis is being managed by the rheumatologist. Her triglycerides are elevated agrees to start a very strict diet. Her liver enzymes are also elevated, will schedule an ultrasound of the abdomen.    No other concerns at this time.   Past Medical History:  Diagnosis Date   ADHD (attention deficit hyperactivity disorder)    Ankylosing spondylitis (HCC)    Anxiety    Arthritis    Depression    Eczema    Family history of adverse reaction to anesthesia    dad would be crazy, talk out of his head-per pt   Headache    Rheumatoid arthritis (HCC)     Past Surgical History:  Procedure Laterality Date   BLADDER SUSPENSION N/A 04/14/2021   Procedure: TRANSVAGINAL TAPE (TVT) PROCEDURE;  Surgeon: Essie Hart, MD;  Location: [facility];  Service: Gynecology;  Laterality: N/A;  GYNCARE TVT EXACT   CYSTOSCOPY N/A 04/14/2021   Procedure: CYSTOSCOPY;  Surgeon: Essie Hart, MD;  Location: [facility];  Service: Gynecology;  Laterality: N/A;   KNEE ARTHROSCOPY Left 1988   REVERSE SHOULDER ARTHROPLASTY Right 01/05/2022   Procedure: RIGHT SHOULDER REVERSE SHOULDER ARTHROPLASTY;  Surgeon: Huel Cote, MD;  Location: [facility];  Service: Orthopedics;  Laterality: Right;   REVERSE SHOULDER ARTHROPLASTY Right  04/01/2022   Procedure: RIGHT REVERSE SHOULDER ARTHROPLASTY REVISION;  Surgeon: Huel Cote, MD;  [facility];  Service: Orthopedics;  Laterality: Right;   REVERSE SHOULDER ARTHROPLASTY Right 07/22/2022   Procedure: RIGHT SHOULDER REVISION  ARTHROPLASTY;  Surgeon: Huel Cote, MD;  [facility];  Service: Orthopedics;  Laterality: Right;   TONSILLECTOMY     as a child   TOTAL LAPAROSCOPIC HYSTERECTOMY WITH SALPINGECTOMY Bilateral 04/14/2021   Procedure: TOTAL LAPAROSCOPIC HYSTERECTOMY WITH BILATERAL SALPINGECTOMY AND BILATERAL OOPHERECTOMY;  Surgeon: Essie Hart, MD;  Location: [facility];  Service: Gynecology;  Laterality: Bilateral;    Social History   Socioeconomic History   Marital status: Married    Spouse name: Casimiro Needle   Number of children: 1   Years of education: 12   Highest education level: High school graduate  Occupational History   Not on file  Tobacco Use   Smoking status: Former    Packs/day: 1.00    Years: 25.00    Additional pack years: 0.00    Total pack years: 25.00    Types: Cigarettes    Quit date: 01/09/2016    Years since quitting: 6.8   Smokeless tobacco: Never  Vaping Use   Vaping Use: Never used  Substance and Sexual Activity   Alcohol use: Not Currently   Drug use: No   Sexual activity: Yes    Partners: Male    Birth control/protection: None  Other Topics  Concern   Not on file  Social History Narrative   Not on file   Social Determinants of Health   Financial Resource Strain: Low Risk  (07/14/2018)   Overall Financial Resource Strain (CARDIA)    Difficulty of Paying Living Expenses: Not hard at all  Food Insecurity: No Food Insecurity (07/14/2018)   Hunger Vital Sign    Worried About Running Out of Food in the Last Year: Never true    Ran Out of Food in the Last Year: Never true  Transportation Needs: No Transportation Needs (04/02/2022)   PRAPARE - Administrator, Civil Service (Medical): No    Lack of Transportation  (Non-Medical): No  Physical Activity: Inactive (07/14/2018)   Exercise Vital Sign    Days of Exercise per Week: 0 days    Minutes of Exercise per Session: 0 min  Stress: No Stress Concern Present (07/14/2018)   [employer] of Occupational Health - Occupational Stress Questionnaire    Feeling of Stress : Only a little  Social Connections: Somewhat Isolated (07/14/2018)   Social Connection and Isolation Panel [NHANES]    Frequency of Communication with Friends and Family: More than three times a week    Frequency of Social Gatherings with Friends and Family: More than three times a week    Attends Religious Services: Never    Database administrator or Organizations: No    Attends Banker Meetings: Never    Marital Status: Married  Catering manager Violence: Not At Risk (04/02/2022)   Humiliation, Afraid, Rape, and Kick questionnaire    Fear of Current or Ex-Partner: No    Emotionally Abused: No    Physically Abused: No    Sexually Abused: No    Family History  Problem Relation Age of Onset   Hypertension Mother    Arthritis Father    Heart disease Sister    Pulmonary Hypertension Sister    COPD Sister    Diabetes Sister    Breast cancer Maternal Grandmother    Heart attack Paternal Grandmother    Breast cancer Maternal Aunt    Breast cancer Cousin     No Known Allergies  ROS     Objective:   BP (!) 142/82   Pulse 85   Ht 5\' 6"  (1.676 m)   Wt 292 lb (132.5 kg)   LMP 03/24/2021   SpO2 98%   BMI 47.13 kg/m   Vitals:   11/02/22 1109  BP: (!) 142/82  Pulse: 85  Height: 5\' 6"  (1.676 m)  Weight: 292 lb (132.5 kg)  SpO2: 98%  BMI (Calculated): 47.15    Physical Exam   No results found for any visits on 11/02/22.  Recent Results (from the past 2160 hour(s))  Urinalysis, Complete     Status: Abnormal   Collection Time: 08/26/22  8:59 AM  Result Value Ref Range   Specific Gravity, UA >1.030 (H) 1.005 - 1.030   pH, UA 5.0 5.0 - 7.5   Color,  UA Yellow Yellow   Appearance Ur Hazy (A) Clear   Leukocytes,UA 1+ (A) Negative   Protein,UA Negative Negative/Trace   Glucose, UA Negative Negative   Ketones, UA Negative Negative   RBC, UA Negative Negative   Bilirubin, UA Negative Negative   Urobilinogen, Ur 0.2 0.2 - 1.0 mg/dL   Nitrite, UA Negative Negative   Microscopic Examination See below:   Microscopic Examination     Status: Abnormal   Collection Time: 08/26/22  8:59  AM   Urine  Result Value Ref Range   WBC, UA 11-30 (A) 0 - 5 /hpf   RBC, Urine 0-2 0 - 2 /hpf   Epithelial Cells (non renal) 0-10 0 - 10 /hpf   Casts Present (A) None seen /lpf   Cast Type Hyaline casts N/A   Bacteria, UA Moderate (A) None seen/Few  CULTURE, URINE COMPREHENSIVE     Status: Abnormal   Collection Time: 08/26/22 11:05 AM   Specimen: Urine   UR  Result Value Ref Range   Urine Culture, Comprehensive Final report (A)    Organism ID, Bacteria Escherichia coli (A)     Comment: Cefazolin <=4 ug/mL Cefazolin with an MIC <=16 predicts susceptibility to the oral agents cefaclor, cefdinir, cefpodoxime, cefprozil, cefuroxime, cephalexin, and loracarbef when used for therapy of uncomplicated urinary tract infections due to E. coli, Klebsiella pneumoniae, and Proteus mirabilis. 50,000-100,000 colony forming units per mL    Organism ID, Bacteria Comment (A)     Comment: Beta hemolytic Streptococcus, group B 50,000-100,000 colony forming units per mL Penicillin and ampicillin are drugs of choice for treatment of beta-hemolytic streptococcal infections. Susceptibility testing of penicillins and other beta-lactam agents approved by the FDA for treatment of beta-hemolytic streptococcal infections need not be performed routinely because nonsusceptible isolates are extremely rare in any beta-hemolytic streptococcus and have not been reported for Streptococcus pyogenes (group A). (CLSI)    ANTIMICROBIAL SUSCEPTIBILITY Comment     Comment:       ** S =  Susceptible; I = Intermediate; R = Resistant **                    P = Positive; N = Negative             MICS are expressed in micrograms per mL    Antibiotic                 RSLT#1    RSLT#2    RSLT#3    RSLT#4 Amoxicillin/Clavulanic Acid    S Ampicillin                     S Cefepime                       S Ceftriaxone                    S Cefuroxime                     S Ciprofloxacin                  S Ertapenem                      S Gentamicin                     S Imipenem                       S Levofloxacin                   S Meropenem                      S Nitrofurantoin                 S Piperacillin/Tazobactam        S Tetracycline  S Tobramycin                     S Trimethoprim/Sulfa             S   CBC with Differential/Platelet     Status: Abnormal   Collection Time: 10/26/22 11:55 AM  Result Value Ref Range   WBC 6.5 3.4 - 10.8 x10E3/uL   RBC 4.31 3.77 - 5.28 x10E6/uL   Hemoglobin 11.9 11.1 - 15.9 g/dL   Hematocrit 16.1 09.6 - 46.6 %   MCV 85 79 - 97 fL   MCH 27.6 26.6 - 33.0 pg   MCHC 32.3 31.5 - 35.7 g/dL   RDW 04.5 40.9 - 81.1 %   Platelets 315 150 - 450 x10E3/uL   Neutrophils 36 Not Estab. %   Lymphs 46 Not Estab. %   Monocytes 9 Not Estab. %   Eos 8 Not Estab. %   Basos 1 Not Estab. %   Neutrophils Absolute 2.3 1.4 - 7.0 x10E3/uL   Lymphocytes Absolute 3.0 0.7 - 3.1 x10E3/uL   Monocytes Absolute 0.6 0.1 - 0.9 x10E3/uL   EOS (ABSOLUTE) 0.5 (H) 0.0 - 0.4 x10E3/uL   Basophils Absolute 0.1 0.0 - 0.2 x10E3/uL   Immature Granulocytes 0 Not Estab. %   Immature Grans (Abs) 0.0 0.0 - 0.1 x10E3/uL  Comprehensive metabolic panel     Status: Abnormal   Collection Time: 10/26/22 11:55 AM  Result Value Ref Range   Glucose 113 (H) 70 - 99 mg/dL   BUN 12 6 - 24 mg/dL   Creatinine, Ser 9.14 0.57 - 1.00 mg/dL   eGFR 82 >78 GN/FAO/1.30   BUN/Creatinine Ratio 14 9 - 23   Sodium 143 134 - 144 mmol/L   Potassium 4.2 3.5 - 5.2 mmol/L    Chloride 105 96 - 106 mmol/L   CO2 20 20 - 29 mmol/L   Calcium 9.7 8.7 - 10.2 mg/dL   Total Protein 6.7 6.0 - 8.5 g/dL   Albumin 4.3 3.8 - 4.9 g/dL   Globulin, Total 2.4 1.5 - 4.5 g/dL   Bilirubin Total 0.3 0.0 - 1.2 mg/dL   Alkaline Phosphatase 231 (H) 44 - 121 IU/L   AST 22 0 - 40 IU/L   ALT 35 (H) 0 - 32 IU/L  Lipid Panel w/o Chol/HDL Ratio     Status: Abnormal   Collection Time: 10/26/22 11:55 AM  Result Value Ref Range   Cholesterol, Total 145 100 - 199 mg/dL   Triglycerides 865 (H) 0 - 149 mg/dL   HDL 50 >78 mg/dL   VLDL Cholesterol Cal 31 5 - 40 mg/dL   LDL Chol Calc (NIH) 64 0 - 99 mg/dL  Hgb I6N w/o eAG     Status: Abnormal   Collection Time: 10/26/22 11:55 AM  Result Value Ref Range   Hgb A1c MFr Bld 6.2 (H) 4.8 - 5.6 %    Comment:          Prediabetes: 5.7 - 6.4          Diabetes: >6.4          Glycemic control for adults with diabetes: <7.0   TSH     Status: None   Collection Time: 10/26/22 11:55 AM  Result Value Ref Range   TSH 2.140 0.450 - 4.500 uIU/mL      Assessment & Plan:  Start Norvasc for blood pressure control.  Trulicity to help treat the prediabetes and to lose weight. Problem  List Items Addressed This Visit     Gastroesophageal reflux disease without esophagitis   Chronic right shoulder pain   Relevant Medications   Abatacept 125 MG/ML SOSY   Elevated liver enzymes   Relevant Orders   US Abdomen Complete   Prediabetes   Relevant Medications   Dulaglutide (TRULICITY) 0.75 MG/0.5ML SOPN   Essential hypertension, benign - Primary   Relevant Medications   amLODipine (NORVASC) 5 MG tablet   Breast cancer screening by mammogram   Relevant Orders   MM 3D SCREENING MAMMOGRAM BILATERAL BREAST    Return in about 2 weeks (around 11/16/2022).   Total time spent: 30 minutes  Margaretann Loveless, MD  11/02/2022   This document may have been prepared by [facility] Voice Recognition software and as such may include unintentional dictation errors.

## 2022-11-03 ENCOUNTER — Encounter: Payer: 59 | Admitting: Physical Therapy

## 2022-11-03 ENCOUNTER — Other Ambulatory Visit (HOSPITAL_BASED_OUTPATIENT_CLINIC_OR_DEPARTMENT_OTHER): Payer: Self-pay | Admitting: Orthopaedic Surgery

## 2022-11-03 DIAGNOSIS — G8929 Other chronic pain: Secondary | ICD-10-CM

## 2022-11-08 ENCOUNTER — Encounter: Payer: 59 | Admitting: Physical Therapy

## 2022-11-09 ENCOUNTER — Encounter: Payer: Self-pay | Admitting: Orthopaedic Surgery

## 2022-11-10 ENCOUNTER — Encounter: Payer: 59 | Admitting: Physical Therapy

## 2022-11-10 ENCOUNTER — Encounter: Payer: Self-pay | Admitting: Internal Medicine

## 2022-11-12 ENCOUNTER — Ambulatory Visit
Admission: RE | Admit: 2022-11-12 | Discharge: 2022-11-12 | Disposition: A | Discharge status: Home / Self Care | Payer: 59 | Source: Ambulatory Visit | Attending: Orthopaedic Surgery | Admitting: Orthopaedic Surgery

## 2022-11-12 DIAGNOSIS — M25462 Effusion, left knee: Secondary | ICD-10-CM | POA: Diagnosis not present

## 2022-11-12 DIAGNOSIS — M1712 Unilateral primary osteoarthritis, left knee: Secondary | ICD-10-CM

## 2022-11-12 DIAGNOSIS — M25562 Pain in left knee: Secondary | ICD-10-CM | POA: Diagnosis not present

## 2022-11-12 DIAGNOSIS — M7122 Synovial cyst of popliteal space [Baker], left knee: Secondary | ICD-10-CM | POA: Diagnosis not present

## 2022-11-12 DIAGNOSIS — M23332 Other meniscus derangements, other medial meniscus, left knee: Secondary | ICD-10-CM | POA: Diagnosis not present

## 2022-11-15 ENCOUNTER — Encounter: Payer: 59 | Admitting: Physical Therapy

## 2022-11-16 ENCOUNTER — Other Ambulatory Visit: Payer: Self-pay | Admitting: Nurse Practitioner

## 2022-11-16 ENCOUNTER — Encounter: Payer: Self-pay | Admitting: Nurse Practitioner

## 2022-11-16 DIAGNOSIS — G8929 Other chronic pain: Secondary | ICD-10-CM

## 2022-11-16 DIAGNOSIS — F988 Other specified behavioral and emotional disorders with onset usually occurring in childhood and adolescence: Secondary | ICD-10-CM

## 2022-11-17 ENCOUNTER — Encounter: Payer: 59 | Admitting: Physical Therapy

## 2022-11-17 ENCOUNTER — Ambulatory Visit
Admission: RE | Admit: 2022-11-17 | Discharge: 2022-11-17 | Disposition: A | Discharge status: Home / Self Care | Payer: 59 | Source: Ambulatory Visit | Attending: Internal Medicine | Admitting: Internal Medicine

## 2022-11-17 DIAGNOSIS — R7989 Other specified abnormal findings of blood chemistry: Secondary | ICD-10-CM | POA: Diagnosis not present

## 2022-11-17 DIAGNOSIS — R748 Abnormal levels of other serum enzymes: Secondary | ICD-10-CM | POA: Diagnosis not present

## 2022-11-17 DIAGNOSIS — K760 Fatty (change of) liver, not elsewhere classified: Secondary | ICD-10-CM | POA: Diagnosis not present

## 2022-11-17 DIAGNOSIS — Z7409 Other reduced mobility: Secondary | ICD-10-CM | POA: Diagnosis not present

## 2022-11-18 ENCOUNTER — Other Ambulatory Visit: Payer: Self-pay | Admitting: Internal Medicine

## 2022-11-18 DIAGNOSIS — M05711 Rheumatoid arthritis with rheumatoid factor of right shoulder without organ or systems involvement: Secondary | ICD-10-CM

## 2022-11-18 MED ORDER — AMPHETAMINE-DEXTROAMPHETAMINE 20 MG PO TABS
20.0000 mg | ORAL_TABLET | Freq: Two times a day (BID) | ORAL | 0 refills | Status: DC
Start: 2022-11-18 — End: 2022-12-16

## 2022-11-19 ENCOUNTER — Other Ambulatory Visit: Payer: Self-pay | Admitting: Cardiology

## 2022-11-19 ENCOUNTER — Ambulatory Visit (INDEPENDENT_AMBULATORY_CARE_PROVIDER_SITE_OTHER): Payer: 59 | Admitting: Cardiology

## 2022-11-19 ENCOUNTER — Encounter: Payer: Self-pay | Admitting: Cardiology

## 2022-11-19 VITALS — BP 115/80 | HR 102 | Ht 66.0 in | Wt 289.6 lb

## 2022-11-19 DIAGNOSIS — M25511 Pain in right shoulder: Secondary | ICD-10-CM | POA: Diagnosis not present

## 2022-11-19 DIAGNOSIS — G8929 Other chronic pain: Secondary | ICD-10-CM

## 2022-11-19 DIAGNOSIS — I10 Essential (primary) hypertension: Secondary | ICD-10-CM | POA: Diagnosis not present

## 2022-11-19 DIAGNOSIS — Z6841 Body Mass Index (BMI) 40.0 and over, adult: Secondary | ICD-10-CM

## 2022-11-19 NOTE — Progress Notes (Signed)
Established Patient Office Visit  Subjective:  Patient ID: Anna Keller, female    DOB: [birth year]  Age: 52 y.o. MRN: [id number]  Chief Complaint  Patient presents with   Follow-up    2 week follow up    Patient in office for 2 week follow up.  Has not started Trulicity, waiting on insurance approval. Mammogram waiting to schedule shoulder surgery first, unable to lift arm above head.  Blood pressure well controlled.     No other concerns at this time.   Past Medical History:  Diagnosis Date   ADHD (attention deficit hyperactivity disorder)    Ankylosing spondylitis (HCC)    Anxiety    Arthritis    Depression    Eczema    Family history of adverse reaction to anesthesia    dad would be crazy, talk out of his head-per pt   Headache    Rheumatoid arthritis (HCC)     Past Surgical History:  Procedure Laterality Date   BLADDER SUSPENSION N/A 04/14/2021   Procedure: TRANSVAGINAL TAPE (TVT) PROCEDURE;  Surgeon: Essie Hart, MD;  [facility];  Service: Gynecology;  Laterality: N/A;  GYNCARE TVT EXACT   CYSTOSCOPY N/A 04/14/2021   Procedure: CYSTOSCOPY;  Surgeon: Essie Hart, MD;  [facility];  Service: Gynecology;  Laterality: N/A;   KNEE ARTHROSCOPY Left 1988   REVERSE SHOULDER ARTHROPLASTY Right 01/05/2022   Procedure: RIGHT SHOULDER REVERSE SHOULDER ARTHROPLASTY;  Surgeon: Huel Cote, MD;  [facility];  Service: Orthopedics;  Laterality: Right;   REVERSE SHOULDER ARTHROPLASTY Right 04/01/2022   Procedure: RIGHT REVERSE SHOULDER ARTHROPLASTY REVISION;  Surgeon: Huel Cote, MD;  [facility];  Service: Orthopedics;  Laterality: Right;   REVERSE SHOULDER ARTHROPLASTY Right 07/22/2022   Procedure: RIGHT SHOULDER REVISION  ARTHROPLASTY;  Surgeon: Huel Cote, MD;  [facility];  Service: Orthopedics;  Laterality: Right;   TONSILLECTOMY     as a child   TOTAL LAPAROSCOPIC HYSTERECTOMY WITH SALPINGECTOMY Bilateral 04/14/2021   Procedure:  TOTAL LAPAROSCOPIC HYSTERECTOMY WITH BILATERAL SALPINGECTOMY AND BILATERAL OOPHERECTOMY;  Surgeon: Essie Hart, MD;  Location: [facility];  Service: Gynecology;  Laterality: Bilateral;    Social History   Socioeconomic History   Marital status: Married    Spouse name: Casimiro Needle   Number of children: 1   Years of education: 12   Highest education level: High school graduate  Occupational History   Not on file  Tobacco Use   Smoking status: Former    Current packs/day: 0.00    Average packs/day: 1 pack/day for 25.0 years (25.0 ttl pk-yrs)    Types: Cigarettes    Start date: 01/09/1991    Quit date: 01/09/2016    Years since quitting: 6.8   Smokeless tobacco: Never  Vaping Use   Vaping status: Never Used  Substance and Sexual Activity   Alcohol use: Not Currently   Drug use: No   Sexual activity: Yes    Partners: Male    Birth control/protection: None  Other Topics Concern   Not on file  Social History Narrative   Not on file   Social Determinants of Health   Financial Resource Strain: Low Risk  (07/14/2018)   Overall Financial Resource Strain (CARDIA)    Difficulty of Paying Living Expenses: Not hard at all  Food Insecurity: No Food Insecurity (07/14/2018)   Hunger Vital Sign    Worried About Running Out of Food in the Last Year: Never true    Ran Out of Food  in the Last Year: Never true  Transportation Needs: No Transportation Needs (04/02/2022)   PRAPARE - Administrator, Civil Service (Medical): No    Lack of Transportation (Non-Medical): No  Physical Activity: Inactive (07/14/2018)   Exercise Vital Sign    Days of Exercise per Week: 0 days    Minutes of Exercise per Session: 0 min  Stress: No Stress Concern Present (07/14/2018)   [employer] of Occupational Health - Occupational Stress Questionnaire    Feeling of Stress : Only a little  Social Connections: Somewhat Isolated (07/14/2018)   Social Connection and Isolation Panel [NHANES]    Frequency of  Communication with Friends and Family: More than three times a week    Frequency of Social Gatherings with Friends and Family: More than three times a week    Attends Religious Services: Never    Database administrator or Organizations: No    Attends Banker Meetings: Never    Marital Status: Married  Catering manager Violence: Not At Risk (04/02/2022)   Humiliation, Afraid, Rape, and Kick questionnaire    Fear of Current or Ex-Partner: No    Emotionally Abused: No    Physically Abused: No    Sexually Abused: No    Family History  Problem Relation Age of Onset   Hypertension Mother    Arthritis Father    Heart disease Sister    Pulmonary Hypertension Sister    COPD Sister    Diabetes Sister    Breast cancer Maternal Grandmother    Heart attack Paternal Grandmother    Breast cancer Maternal Aunt    Breast cancer Cousin     No Known Allergies  Review of Systems  Constitutional: Negative.   HENT: Negative.    Eyes: Negative.   Respiratory: Negative.  Negative for shortness of breath.   Cardiovascular: Negative.  Negative for chest pain.  Gastrointestinal: Negative.  Negative for abdominal pain, constipation and diarrhea.  Genitourinary: Negative.   Musculoskeletal:  Negative for joint pain and myalgias.  Skin: Negative.   Neurological: Negative.  Negative for dizziness and headaches.  Endo/Heme/Allergies: Negative.   All other systems reviewed and are negative.      Objective:   BP 115/80   Pulse (!) 102   Ht 5\' 6"  (1.676 m)   Wt 289 lb 9.6 oz (131.4 kg)   LMP 03/24/2021   SpO2 95%   BMI 46.74 kg/m   Vitals:   11/19/22 1106  BP: 115/80  Pulse: (!) 102  Height: 5\' 6"  (1.676 m)  Weight: 289 lb 9.6 oz (131.4 kg)  SpO2: 95%  BMI (Calculated): 46.77    Physical Exam Vitals and nursing note reviewed.  Constitutional:      Appearance: Normal appearance. She is normal weight.  HENT:     Head: Normocephalic and atraumatic.     Nose: Nose  normal.     Mouth/Throat:     Mouth: Mucous membranes are moist.  Eyes:     Extraocular Movements: Extraocular movements intact.     Conjunctiva/sclera: Conjunctivae normal.     Pupils: Pupils are equal, round, and reactive to light.  Cardiovascular:     Rate and Rhythm: Normal rate and regular rhythm.     Pulses: Normal pulses.     Heart sounds: Normal heart sounds.  Pulmonary:     Effort: Pulmonary effort is normal.     Breath sounds: Normal breath sounds.  Abdominal:     General: Abdomen is  flat. Bowel sounds are normal.     Palpations: Abdomen is soft.  Musculoskeletal:        General: Normal range of motion.     Cervical back: Normal range of motion.  Skin:    General: Skin is warm and dry.  Neurological:     General: No focal deficit present.     Mental Status: She is alert and oriented to person, place, and time.  Psychiatric:        Mood and Affect: Mood normal.        Behavior: Behavior normal.        Thought Content: Thought content normal.        Judgment: Judgment normal.      No results found for any visits on 11/19/22.  Recent Results (from the past 2160 hour(s))  Urinalysis, Complete     Status: Abnormal   Collection Time: 08/26/22  8:59 AM  Result Value Ref Range   Specific Gravity, UA >1.030 (H) 1.005 - 1.030   pH, UA 5.0 5.0 - 7.5   Color, UA Yellow Yellow   Appearance Ur Hazy (A) Clear   Leukocytes,UA 1+ (A) Negative   Protein,UA Negative Negative/Trace   Glucose, UA Negative Negative   Ketones, UA Negative Negative   RBC, UA Negative Negative   Bilirubin, UA Negative Negative   Urobilinogen, Ur 0.2 0.2 - 1.0 mg/dL   Nitrite, UA Negative Negative   Microscopic Examination See below:   Microscopic Examination     Status: Abnormal   Collection Time: 08/26/22  8:59 AM   Urine  Result Value Ref Range   WBC, UA 11-30 (A) 0 - 5 /hpf   RBC, Urine 0-2 0 - 2 /hpf   Epithelial Cells (non renal) 0-10 0 - 10 /hpf   Casts Present (A) None seen /lpf    Cast Type Hyaline casts N/A   Bacteria, UA Moderate (A) None seen/Few  CULTURE, URINE COMPREHENSIVE     Status: Abnormal   Collection Time: 08/26/22 11:05 AM   Specimen: Urine   UR  Result Value Ref Range   Urine Culture, Comprehensive Final report (A)    Organism ID, Bacteria Escherichia coli (A)     Comment: Cefazolin <=4 ug/mL Cefazolin with an MIC <=16 predicts susceptibility to the oral agents cefaclor, cefdinir, cefpodoxime, cefprozil, cefuroxime, cephalexin, and loracarbef when used for therapy of uncomplicated urinary tract infections due to E. coli, Klebsiella pneumoniae, and Proteus mirabilis. 50,000-100,000 colony forming units per mL    Organism ID, Bacteria Comment (A)     Comment: Beta hemolytic Streptococcus, group B 50,000-100,000 colony forming units per mL Penicillin and ampicillin are drugs of choice for treatment of beta-hemolytic streptococcal infections. Susceptibility testing of penicillins and other beta-lactam agents approved by the FDA for treatment of beta-hemolytic streptococcal infections need not be performed routinely because nonsusceptible isolates are extremely rare in any beta-hemolytic streptococcus and have not been reported for Streptococcus pyogenes (group A). (CLSI)    ANTIMICROBIAL SUSCEPTIBILITY Comment     Comment:       ** S = Susceptible; I = Intermediate; R = Resistant **                    P = Positive; N = Negative             MICS are expressed in micrograms per mL    Antibiotic  RSLT#1    RSLT#2    RSLT#3    RSLT#4 Amoxicillin/Clavulanic Acid    S Ampicillin                     S Cefepime                       S Ceftriaxone                    S Cefuroxime                     S Ciprofloxacin                  S Ertapenem                      S Gentamicin                     S Imipenem                       S Levofloxacin                   S Meropenem                      S Nitrofurantoin                  S Piperacillin/Tazobactam        S Tetracycline                   S Tobramycin                     S Trimethoprim/Sulfa             S   CBC with Differential/Platelet     Status: Abnormal   Collection Time: 10/26/22 11:55 AM  Result Value Ref Range   WBC 6.5 3.4 - 10.8 x10E3/uL   RBC 4.31 3.77 - 5.28 x10E6/uL   Hemoglobin 11.9 11.1 - 15.9 g/dL   Hematocrit 08.6 57.8 - 46.6 %   MCV 85 79 - 97 fL   MCH 27.6 26.6 - 33.0 pg   MCHC 32.3 31.5 - 35.7 g/dL   RDW 46.9 62.9 - 52.8 %   Platelets 315 150 - 450 x10E3/uL   Neutrophils 36 Not Estab. %   Lymphs 46 Not Estab. %   Monocytes 9 Not Estab. %   Eos 8 Not Estab. %   Basos 1 Not Estab. %   Neutrophils Absolute 2.3 1.4 - 7.0 x10E3/uL   Lymphocytes Absolute 3.0 0.7 - 3.1 x10E3/uL   Monocytes Absolute 0.6 0.1 - 0.9 x10E3/uL   EOS (ABSOLUTE) 0.5 (H) 0.0 - 0.4 x10E3/uL   Basophils Absolute 0.1 0.0 - 0.2 x10E3/uL   Immature Granulocytes 0 Not Estab. %   Immature Grans (Abs) 0.0 0.0 - 0.1 x10E3/uL  Comprehensive metabolic panel     Status: Abnormal   Collection Time: 10/26/22 11:55 AM  Result Value Ref Range   Glucose 113 (H) 70 - 99 mg/dL   BUN 12 6 - 24 mg/dL   Creatinine, Ser 4.13 0.57 - 1.00 mg/dL   eGFR 82 >24 MW/NUU/7.25   BUN/Creatinine Ratio 14 9 - 23   Sodium 143 134 - 144 mmol/L   Potassium 4.2 3.5 - 5.2 mmol/L   Chloride 105 96 -  106 mmol/L   CO2 20 20 - 29 mmol/L   Calcium 9.7 8.7 - 10.2 mg/dL   Total Protein 6.7 6.0 - 8.5 g/dL   Albumin 4.3 3.8 - 4.9 g/dL   Globulin, Total 2.4 1.5 - 4.5 g/dL   Bilirubin Total 0.3 0.0 - 1.2 mg/dL   Alkaline Phosphatase 231 (H) 44 - 121 IU/L   AST 22 0 - 40 IU/L   ALT 35 (H) 0 - 32 IU/L  Lipid Panel w/o Chol/HDL Ratio     Status: Abnormal   Collection Time: 10/26/22 11:55 AM  Result Value Ref Range   Cholesterol, Total 145 100 - 199 mg/dL   Triglycerides 409 (H) 0 - 149 mg/dL   HDL 50 >81 mg/dL   VLDL Cholesterol Cal 31 5 - 40 mg/dL   LDL Chol Calc (NIH) 64 0 - 99 mg/dL  Hgb X9J  w/o eAG     Status: Abnormal   Collection Time: 10/26/22 11:55 AM  Result Value Ref Range   Hgb A1c MFr Bld 6.2 (H) 4.8 - 5.6 %    Comment:          Prediabetes: 5.7 - 6.4          Diabetes: >6.4          Glycemic control for adults with diabetes: <7.0   TSH     Status: None   Collection Time: 10/26/22 11:55 AM  Result Value Ref Range   TSH 2.140 0.450 - 4.500 uIU/mL      Assessment & Plan:  Continue amlodipine for blood pressure. Start Trulicity when approved, follow up in office 4 weeks later.  Schedule mammogram when shoulder improved. Patient scheduled to see a hepatologist per her rheumatologist. Keep specialists appointments.   Problem List Items Addressed This Visit       Cardiovascular and Mediastinum   Essential hypertension, benign - Primary     Other   Class 3 severe obesity due to excess calories with body mass index (BMI) of 40.0 to 44.9 in adult [facility])   Chronic right shoulder pain   Relevant Medications   Abatacept (ORENCIA IV)    Return in about 6 weeks (around 12/31/2022) for 4 weeks after starting Trulicity.   Total time spent: 25 minutes  [employer], NP  11/19/2022   This document may have been prepared by Dragon Voice Recognition software and as such may include unintentional dictation errors.

## 2022-11-21 ENCOUNTER — Other Ambulatory Visit: Payer: Self-pay | Admitting: Nurse Practitioner

## 2022-11-21 DIAGNOSIS — G8929 Other chronic pain: Secondary | ICD-10-CM

## 2022-11-22 ENCOUNTER — Encounter: Payer: 59 | Admitting: Physical Therapy

## 2022-11-24 ENCOUNTER — Encounter: Payer: 59 | Admitting: Physical Therapy

## 2022-11-29 ENCOUNTER — Encounter: Payer: 59 | Admitting: Physical Therapy

## 2022-11-30 DIAGNOSIS — M0609 Rheumatoid arthritis without rheumatoid factor, multiple sites: Secondary | ICD-10-CM | POA: Diagnosis not present

## 2022-12-01 ENCOUNTER — Encounter: Payer: 59 | Admitting: Physical Therapy

## 2022-12-01 ENCOUNTER — Encounter: Payer: Self-pay | Admitting: Orthopaedic Surgery

## 2022-12-01 ENCOUNTER — Other Ambulatory Visit: Payer: Self-pay | Admitting: Family

## 2022-12-01 DIAGNOSIS — Z96611 Presence of right artificial shoulder joint: Secondary | ICD-10-CM | POA: Diagnosis not present

## 2022-12-01 DIAGNOSIS — M25511 Pain in right shoulder: Secondary | ICD-10-CM | POA: Diagnosis not present

## 2022-12-01 DIAGNOSIS — G8929 Other chronic pain: Secondary | ICD-10-CM | POA: Diagnosis not present

## 2022-12-02 ENCOUNTER — Encounter: Payer: Self-pay | Admitting: Nurse Practitioner

## 2022-12-02 ENCOUNTER — Other Ambulatory Visit: Payer: Self-pay

## 2022-12-02 DIAGNOSIS — G8929 Other chronic pain: Secondary | ICD-10-CM

## 2022-12-03 ENCOUNTER — Other Ambulatory Visit: Payer: Self-pay | Admitting: Orthopedic Surgery

## 2022-12-03 ENCOUNTER — Encounter (HOSPITAL_COMMUNITY): Payer: Self-pay | Admitting: General Surgery

## 2022-12-03 DIAGNOSIS — G8929 Other chronic pain: Secondary | ICD-10-CM

## 2022-12-06 ENCOUNTER — Other Ambulatory Visit: Payer: Self-pay | Admitting: Family

## 2022-12-06 ENCOUNTER — Encounter: Payer: 59 | Admitting: Physical Therapy

## 2022-12-07 ENCOUNTER — Ambulatory Visit: Payer: 59 | Admitting: Physician Assistant

## 2022-12-08 ENCOUNTER — Ambulatory Visit
Admission: RE | Admit: 2022-12-08 | Discharge: 2022-12-08 | Disposition: A | Discharge status: Home / Self Care | Payer: 59 | Source: Ambulatory Visit | Attending: Orthopedic Surgery | Admitting: Orthopedic Surgery

## 2022-12-08 ENCOUNTER — Encounter: Payer: 59 | Admitting: Physical Therapy

## 2022-12-08 DIAGNOSIS — M25511 Pain in right shoulder: Secondary | ICD-10-CM | POA: Diagnosis not present

## 2022-12-08 DIAGNOSIS — G8929 Other chronic pain: Secondary | ICD-10-CM

## 2022-12-08 DIAGNOSIS — Z96611 Presence of right artificial shoulder joint: Secondary | ICD-10-CM | POA: Diagnosis not present

## 2022-12-13 ENCOUNTER — Encounter: Payer: 59 | Admitting: Physical Therapy

## 2022-12-15 ENCOUNTER — Encounter: Payer: 59 | Admitting: Physical Therapy

## 2022-12-15 ENCOUNTER — Encounter: Payer: Self-pay | Admitting: Cardiology

## 2022-12-16 ENCOUNTER — Other Ambulatory Visit: Payer: Self-pay | Admitting: Internal Medicine

## 2022-12-16 DIAGNOSIS — F988 Other specified behavioral and emotional disorders with onset usually occurring in childhood and adolescence: Secondary | ICD-10-CM

## 2022-12-16 MED ORDER — AMPHETAMINE-DEXTROAMPHETAMINE 20 MG PO TABS
20.0000 mg | ORAL_TABLET | Freq: Two times a day (BID) | ORAL | 0 refills | Status: DC
Start: 2022-12-16 — End: 2023-01-20

## 2022-12-19 ENCOUNTER — Other Ambulatory Visit: Payer: Self-pay | Admitting: Family

## 2022-12-19 DIAGNOSIS — G4700 Insomnia, unspecified: Secondary | ICD-10-CM

## 2022-12-20 ENCOUNTER — Encounter: Payer: 59 | Admitting: Physical Therapy

## 2022-12-20 DIAGNOSIS — E785 Hyperlipidemia, unspecified: Secondary | ICD-10-CM | POA: Insufficient documentation

## 2022-12-20 DIAGNOSIS — F909 Attention-deficit hyperactivity disorder, unspecified type: Secondary | ICD-10-CM | POA: Insufficient documentation

## 2022-12-20 DIAGNOSIS — K760 Fatty (change of) liver, not elsewhere classified: Secondary | ICD-10-CM | POA: Diagnosis not present

## 2022-12-20 DIAGNOSIS — R748 Abnormal levels of other serum enzymes: Secondary | ICD-10-CM | POA: Diagnosis not present

## 2022-12-20 DIAGNOSIS — M0600 Rheumatoid arthritis without rheumatoid factor, unspecified site: Secondary | ICD-10-CM | POA: Insufficient documentation

## 2022-12-22 ENCOUNTER — Encounter: Payer: 59 | Admitting: Physical Therapy

## 2022-12-22 ENCOUNTER — Ambulatory Visit: Payer: 59 | Admitting: Cardiology

## 2022-12-23 ENCOUNTER — Ambulatory Visit (INDEPENDENT_AMBULATORY_CARE_PROVIDER_SITE_OTHER): Payer: 59 | Admitting: Orthopaedic Surgery

## 2022-12-23 ENCOUNTER — Encounter: Payer: Self-pay | Admitting: Cardiology

## 2022-12-23 ENCOUNTER — Ambulatory Visit: Payer: 59 | Admitting: Cardiology

## 2022-12-23 ENCOUNTER — Encounter: Payer: Self-pay | Admitting: Orthopaedic Surgery

## 2022-12-23 VITALS — BP 140/90 | HR 91 | Ht 66.0 in | Wt 286.6 lb

## 2022-12-23 DIAGNOSIS — M1712 Unilateral primary osteoarthritis, left knee: Secondary | ICD-10-CM | POA: Diagnosis not present

## 2022-12-23 DIAGNOSIS — M25562 Pain in left knee: Secondary | ICD-10-CM | POA: Diagnosis not present

## 2022-12-23 DIAGNOSIS — G8929 Other chronic pain: Secondary | ICD-10-CM

## 2022-12-23 DIAGNOSIS — K146 Glossodynia: Secondary | ICD-10-CM

## 2022-12-23 MED ORDER — MAGIC MOUTHWASH
15.0000 mL | Freq: Three times a day (TID) | ORAL | Status: DC
Start: 2022-12-23 — End: 2022-12-23

## 2022-12-23 MED ORDER — LIDOCAINE VISCOUS HCL 2 % MT SOLN: 10.0000 mL | Freq: Three times a day (TID) | OROMUCOSAL | 2 refills | Status: DC

## 2022-12-23 NOTE — Progress Notes (Signed)
The patient is a 52 year old female who comes in to go over a MRI of her left knee.  She has been dealing unfortunately with significant right shoulder issues from multiple surgeries on the right shoulder.  She said in November of this year she will have more reconstructive surgery on the right shoulder.  She has been dealing with left knee pain for a long period of time.  She has had arthroscopic surgery on that knee remotely.  Her BMI on last check was 46.26.  Her left knee has medial and lateral joint line tenderness with slight varus malalignment.  There is also patellofemoral crepitation.  The knee is ligamentously stable.  The MRI of her left knee does show severe tricompartment arthritis.  There is full-thickness cartilage loss in the medial aspect of the knee with subchondral edema.  There is chronic meniscal tearing and protrusion of the meniscus.  There is significant patellofemoral arthritic changes and thinning of the lateral compartment cartilage as well.  From a knee standpoint the only surgical treatment recommended would be a knee replacement.  However with her BMI of 46 we will need her to be on more of a weight loss journey and getting her BMI much lower before we would be comfortable with proceeding with knee replacement surgery.  It also sounds like her shoulder is such an issue that she needs to have that taken care of.  Anytime someone recovers from knee replacement surgery the need to be able to ambulate with a walker and this can a bit too much pressure on her right shoulder.  For now we are recommending mainly weight loss.  She has had hyaluronic acid in the past and has basically failed all forms of conservative treatment for the knee.

## 2022-12-23 NOTE — Progress Notes (Signed)
Established Patient Office Visit  Subjective:  Patient ID: Anna Keller, female    DOB: 1970-05-26  Age: 52 y.o. MRN: [id number]  Chief Complaint  Patient presents with   Acute Visit    Tongue pain    Patient in office complaining of painful sore on tongue, states it feels like her tongue was burned. Started about a month ago. Has a history of cold sores. States hasn't had one in "many years". Started a new infusion for RA about a month ago, soreness started soon after. Will send in prescription for magic mouthwash.     No other concerns at this time.   Past Medical History:  Diagnosis Date   ADHD (attention deficit hyperactivity disorder)    Ankylosing spondylitis (HCC)    Anxiety    Arthritis    Depression    Eczema    Family history of adverse reaction to anesthesia    dad would be crazy, talk out of his head-per pt   Headache    Rheumatoid arthritis (HCC)     Past Surgical History:  Procedure Laterality Date   BLADDER SUSPENSION N/A 04/14/2021   Procedure: TRANSVAGINAL TAPE (TVT) PROCEDURE;  Surgeon: Essie Hart, MD;  Location: [facility] OR;  Service: Gynecology;  Laterality: N/A;  GYNCARE TVT EXACT   CYSTOSCOPY N/A 04/14/2021   Procedure: CYSTOSCOPY;  Surgeon: Essie Hart, MD;  Location: [facility] OR;  Service: Gynecology;  Laterality: N/A;   KNEE ARTHROSCOPY Left 1988   REVERSE SHOULDER ARTHROPLASTY Right 01/05/2022   Procedure: RIGHT SHOULDER REVERSE SHOULDER ARTHROPLASTY;  Surgeon: Huel Cote, MD;  Location: [facility] OR;  Service: Orthopedics;  Laterality: Right;   REVERSE SHOULDER ARTHROPLASTY Right 04/01/2022   Procedure: RIGHT REVERSE SHOULDER ARTHROPLASTY REVISION;  Surgeon: Huel Cote, MD;  Location: [facility] OR;  Service: Orthopedics;  Laterality: Right;   REVERSE SHOULDER ARTHROPLASTY Right 07/22/2022   Procedure: RIGHT SHOULDER REVISION  ARTHROPLASTY;  Surgeon: Huel Cote, MD;  Location: [facility];  Service: Orthopedics;  Laterality: Right;   TONSILLECTOMY     as  a child   TOTAL LAPAROSCOPIC HYSTERECTOMY WITH SALPINGECTOMY Bilateral 04/14/2021   Procedure: TOTAL LAPAROSCOPIC HYSTERECTOMY WITH BILATERAL SALPINGECTOMY AND BILATERAL OOPHERECTOMY;  Surgeon: Essie Hart, MD;  Location: [facility] OR;  Service: Gynecology;  Laterality: Bilateral;    Social History   Socioeconomic History   Marital status: Married    Spouse name: Casimiro Needle   Number of children: 1   Years of education: 12   Highest education level: High school graduate  Occupational History   Not on file  Tobacco Use   Smoking status: Former    Current packs/day: 0.00    Average packs/day: 1 pack/day for 25.0 years (25.0 ttl pk-yrs)    Types: Cigarettes    Start date: 01/09/1991    Quit date: 01/09/2016    Years since quitting: 6.9   Smokeless tobacco: Never  Vaping Use   Vaping status: Never Used  Substance and Sexual Activity   Alcohol use: Not Currently   Drug use: No   Sexual activity: Yes    Partners: Male    Birth control/protection: None  Other Topics Concern   Not on file  Social History Narrative   Not on file   Social Determinants of Health   Financial Resource Strain: Low Risk  (07/14/2018)   Overall Financial Resource Strain (CARDIA)    Difficulty of Paying Living Expenses: Not hard at all  Food Insecurity: No Food Insecurity (07/14/2018)   Hunger Vital Sign  Worried About Programme researcher, broadcasting/film/video in the Last Year: Never true    Ran Out of Food in the Last Year: Never true  Transportation Needs: No Transportation Needs (04/02/2022)   PRAPARE - Administrator, Civil Service (Medical): No    Lack of Transportation (Non-Medical): No  Physical Activity: Inactive (07/14/2018)   Exercise Vital Sign    Days of Exercise per Week: 0 days    Minutes of Exercise per Session: 0 min  Stress: No Stress Concern Present (07/14/2018)   [employer] of Occupational Health - Occupational Stress Questionnaire    Feeling of Stress : Only a little  Social Connections:  Somewhat Isolated (07/14/2018)   Social Connection and Isolation Panel [NHANES]    Frequency of Communication with Friends and Family: More than three times a week    Frequency of Social Gatherings with Friends and Family: More than three times a week    Attends Religious Services: Never    Database administrator or Organizations: No    Attends Banker Meetings: Never    Marital Status: Married  Catering manager Violence: Not At Risk (04/02/2022)   Humiliation, Afraid, Rape, and Kick questionnaire    Fear of Current or Ex-Partner: No    Emotionally Abused: No    Physically Abused: No    Sexually Abused: No    Family History  Problem Relation Age of Onset   Hypertension Mother    Arthritis Father    Heart disease Sister    Pulmonary Hypertension Sister    COPD Sister    Diabetes Sister    Breast cancer Maternal Grandmother    Heart attack Paternal Grandmother    Breast cancer Maternal Aunt    Breast cancer Cousin     No Known Allergies  Review of Systems  Constitutional: Negative.   HENT: Negative.         Painful tongue  Eyes: Negative.   Respiratory: Negative.  Negative for shortness of breath.   Cardiovascular: Negative.  Negative for chest pain.  Gastrointestinal: Negative.  Negative for abdominal pain, constipation and diarrhea.  Genitourinary: Negative.   Musculoskeletal:  Negative for joint pain and myalgias.  Skin: Negative.   Neurological: Negative.  Negative for dizziness and headaches.  Endo/Heme/Allergies: Negative.   All other systems reviewed and are negative.      Objective:   BP (!) 140/90   Pulse 91   Ht 5\' 6"  (1.676 m)   Wt 286 lb 9.6 oz (130 kg)   LMP 03/24/2021   SpO2 96%   BMI 46.26 kg/m   Vitals:   12/23/22 1018  BP: (!) 140/90  Pulse: 91  Height: 5\' 6"  (1.676 m)  Weight: 286 lb 9.6 oz (130 kg)  SpO2: 96%  BMI (Calculated): 46.28    Physical Exam Vitals and nursing note reviewed.  Constitutional:       Appearance: Normal appearance. She is normal weight.  HENT:     Head: Normocephalic and atraumatic.     Nose: Nose normal.     Mouth/Throat:     Mouth: Mucous membranes are moist.   Eyes:     Extraocular Movements: Extraocular movements intact.     Conjunctiva/sclera: Conjunctivae normal.     Pupils: Pupils are equal, round, and reactive to light.  Cardiovascular:     Rate and Rhythm: Normal rate and regular rhythm.     Pulses: Normal pulses.     Heart sounds: Normal heart sounds.  Pulmonary:     Effort: Pulmonary effort is normal.     Breath sounds: Normal breath sounds.  Abdominal:     General: Abdomen is flat. Bowel sounds are normal.     Palpations: Abdomen is soft.  Musculoskeletal:        General: Normal range of motion.     Cervical back: Normal range of motion.  Skin:    General: Skin is warm and dry.  Neurological:     General: No focal deficit present.     Mental Status: She is alert and oriented to person, place, and time.  Psychiatric:        Mood and Affect: Mood normal.        Behavior: Behavior normal.        Thought Content: Thought content normal.        Judgment: Judgment normal.      No results found for any visits on 12/23/22.  Recent Results (from the past 2160 hour(s))  CBC with Differential/Platelet     Status: Abnormal   Collection Time: 10/26/22 11:55 AM  Result Value Ref Range   WBC 6.5 3.4 - 10.8 x10E3/uL   RBC 4.31 3.77 - 5.28 x10E6/uL   Hemoglobin 11.9 11.1 - 15.9 g/dL   Hematocrit 82.9 56.2 - 46.6 %   MCV 85 79 - 97 fL   MCH 27.6 26.6 - 33.0 pg   MCHC 32.3 31.5 - 35.7 g/dL   RDW 13.0 86.5 - 78.4 %   Platelets 315 150 - 450 x10E3/uL   Neutrophils 36 Not Estab. %   Lymphs 46 Not Estab. %   Monocytes 9 Not Estab. %   Eos 8 Not Estab. %   Basos 1 Not Estab. %   Neutrophils Absolute 2.3 1.4 - 7.0 x10E3/uL   Lymphocytes Absolute 3.0 0.7 - 3.1 x10E3/uL   Monocytes Absolute 0.6 0.1 - 0.9 x10E3/uL   EOS (ABSOLUTE) 0.5 (H) 0.0 - 0.4  x10E3/uL   Basophils Absolute 0.1 0.0 - 0.2 x10E3/uL   Immature Granulocytes 0 Not Estab. %   Immature Grans (Abs) 0.0 0.0 - 0.1 x10E3/uL  Comprehensive metabolic panel     Status: Abnormal   Collection Time: 10/26/22 11:55 AM  Result Value Ref Range   Glucose 113 (H) 70 - 99 mg/dL   BUN 12 6 - 24 mg/dL   Creatinine, Ser 6.96 0.57 - 1.00 mg/dL   eGFR 82 >29 BM/WUX/3.24   BUN/Creatinine Ratio 14 9 - 23   Sodium 143 134 - 144 mmol/L   Potassium 4.2 3.5 - 5.2 mmol/L   Chloride 105 96 - 106 mmol/L   CO2 20 20 - 29 mmol/L   Calcium 9.7 8.7 - 10.2 mg/dL   Total Protein 6.7 6.0 - 8.5 g/dL   Albumin 4.3 3.8 - 4.9 g/dL   Globulin, Total 2.4 1.5 - 4.5 g/dL   Bilirubin Total 0.3 0.0 - 1.2 mg/dL   Alkaline Phosphatase 231 (H) 44 - 121 IU/L   AST 22 0 - 40 IU/L   ALT 35 (H) 0 - 32 IU/L  Lipid Panel w/o Chol/HDL Ratio     Status: Abnormal   Collection Time: 10/26/22 11:55 AM  Result Value Ref Range   Cholesterol, Total 145 100 - 199 mg/dL   Triglycerides 401 (H) 0 - 149 mg/dL   HDL 50 >02 mg/dL   VLDL Cholesterol Cal 31 5 - 40 mg/dL   LDL Chol Calc (NIH) 64 0 - 99 mg/dL  Hgb V2Z  w/o eAG     Status: Abnormal   Collection Time: 10/26/22 11:55 AM  Result Value Ref Range   Hgb A1c MFr Bld 6.2 (H) 4.8 - 5.6 %    Comment:          Prediabetes: 5.7 - 6.4          Diabetes: >6.4          Glycemic control for adults with diabetes: <7.0   TSH     Status: None   Collection Time: 10/26/22 11:55 AM  Result Value Ref Range   TSH 2.140 0.450 - 4.500 uIU/mL      Assessment & Plan:  Magic mouthwash  Problem List Items Addressed This Visit       Digestive   Painful tongue - Primary    Return if symptoms worsen or fail to improve.   Total time spent: 20 minutes  [employer], NP  12/23/2022   This document may have been prepared by Dragon Voice Recognition software and as such may include unintentional dictation errors.

## 2022-12-27 ENCOUNTER — Encounter: Payer: Self-pay | Admitting: Cardiology

## 2022-12-28 DIAGNOSIS — M0609 Rheumatoid arthritis without rheumatoid factor, multiple sites: Secondary | ICD-10-CM | POA: Diagnosis not present

## 2022-12-31 ENCOUNTER — Other Ambulatory Visit: Payer: Self-pay | Admitting: Urology

## 2022-12-31 ENCOUNTER — Ambulatory Visit: Payer: 59 | Admitting: Family

## 2022-12-31 DIAGNOSIS — M25511 Pain in right shoulder: Secondary | ICD-10-CM | POA: Diagnosis not present

## 2022-12-31 DIAGNOSIS — G90511 Complex regional pain syndrome I of right upper limb: Secondary | ICD-10-CM | POA: Diagnosis not present

## 2022-12-31 DIAGNOSIS — Z96611 Presence of right artificial shoulder joint: Secondary | ICD-10-CM | POA: Diagnosis not present

## 2022-12-31 DIAGNOSIS — G8929 Other chronic pain: Secondary | ICD-10-CM | POA: Diagnosis not present

## 2023-01-03 ENCOUNTER — Encounter: Payer: Self-pay | Admitting: Cardiology

## 2023-01-03 ENCOUNTER — Encounter: Payer: Self-pay | Admitting: Urology

## 2023-01-04 ENCOUNTER — Other Ambulatory Visit: Payer: Self-pay

## 2023-01-04 DIAGNOSIS — F419 Anxiety disorder, unspecified: Secondary | ICD-10-CM

## 2023-01-04 MED ORDER — ALPRAZOLAM 1 MG PO TABS
1.0000 mg | ORAL_TABLET | Freq: Three times a day (TID) | ORAL | 1 refills | Status: DC | PRN
Start: 2023-01-04 — End: 2023-10-03

## 2023-01-05 ENCOUNTER — Encounter: Payer: Self-pay | Admitting: Cardiology

## 2023-01-05 ENCOUNTER — Ambulatory Visit (HOSPITAL_BASED_OUTPATIENT_CLINIC_OR_DEPARTMENT_OTHER): Payer: 59 | Admitting: Orthopaedic Surgery

## 2023-01-10 ENCOUNTER — Encounter: Payer: Self-pay | Admitting: Cardiology

## 2023-01-17 ENCOUNTER — Other Ambulatory Visit: Payer: Self-pay

## 2023-01-17 ENCOUNTER — Encounter: Payer: Self-pay | Admitting: Cardiology

## 2023-01-17 DIAGNOSIS — F988 Other specified behavioral and emotional disorders with onset usually occurring in childhood and adolescence: Secondary | ICD-10-CM

## 2023-01-18 ENCOUNTER — Other Ambulatory Visit: Payer: Self-pay

## 2023-01-18 DIAGNOSIS — F988 Other specified behavioral and emotional disorders with onset usually occurring in childhood and adolescence: Secondary | ICD-10-CM

## 2023-01-19 ENCOUNTER — Other Ambulatory Visit: Payer: Self-pay

## 2023-01-19 ENCOUNTER — Encounter: Payer: Self-pay | Admitting: Cardiology

## 2023-01-19 DIAGNOSIS — F988 Other specified behavioral and emotional disorders with onset usually occurring in childhood and adolescence: Secondary | ICD-10-CM

## 2023-01-19 NOTE — Telephone Encounter (Signed)
Pt sent messages stating that the mouth wash you sent in for her mouth and tongue sores isnt helping and was wondering if there was anything else you could call in- please advise

## 2023-01-20 ENCOUNTER — Other Ambulatory Visit: Payer: Self-pay

## 2023-01-20 DIAGNOSIS — F988 Other specified behavioral and emotional disorders with onset usually occurring in childhood and adolescence: Secondary | ICD-10-CM

## 2023-01-20 MED ORDER — AMPHETAMINE-DEXTROAMPHETAMINE 20 MG PO TABS
20.0000 mg | ORAL_TABLET | Freq: Two times a day (BID) | ORAL | 0 refills | Status: DC
Start: 2023-01-20 — End: 2023-02-18

## 2023-01-26 DIAGNOSIS — M25511 Pain in right shoulder: Secondary | ICD-10-CM | POA: Diagnosis not present

## 2023-01-26 DIAGNOSIS — Z96611 Presence of right artificial shoulder joint: Secondary | ICD-10-CM | POA: Diagnosis not present

## 2023-01-26 DIAGNOSIS — G90511 Complex regional pain syndrome I of right upper limb: Secondary | ICD-10-CM | POA: Diagnosis not present

## 2023-01-26 DIAGNOSIS — G8929 Other chronic pain: Secondary | ICD-10-CM | POA: Diagnosis not present

## 2023-01-31 ENCOUNTER — Other Ambulatory Visit: Payer: Self-pay | Admitting: Internal Medicine

## 2023-01-31 DIAGNOSIS — G4700 Insomnia, unspecified: Secondary | ICD-10-CM

## 2023-02-08 DIAGNOSIS — K1230 Oral mucositis (ulcerative), unspecified: Secondary | ICD-10-CM | POA: Diagnosis not present

## 2023-02-09 IMAGING — CR DG CHEST 2V
2 series · 2 of 2 positions shown · non-contrast
Comparison: Chest radiograph 10/02/2018

CLINICAL DATA: Chest congestion.

EXAM:
CHEST - 2 VIEW

[chest pa]
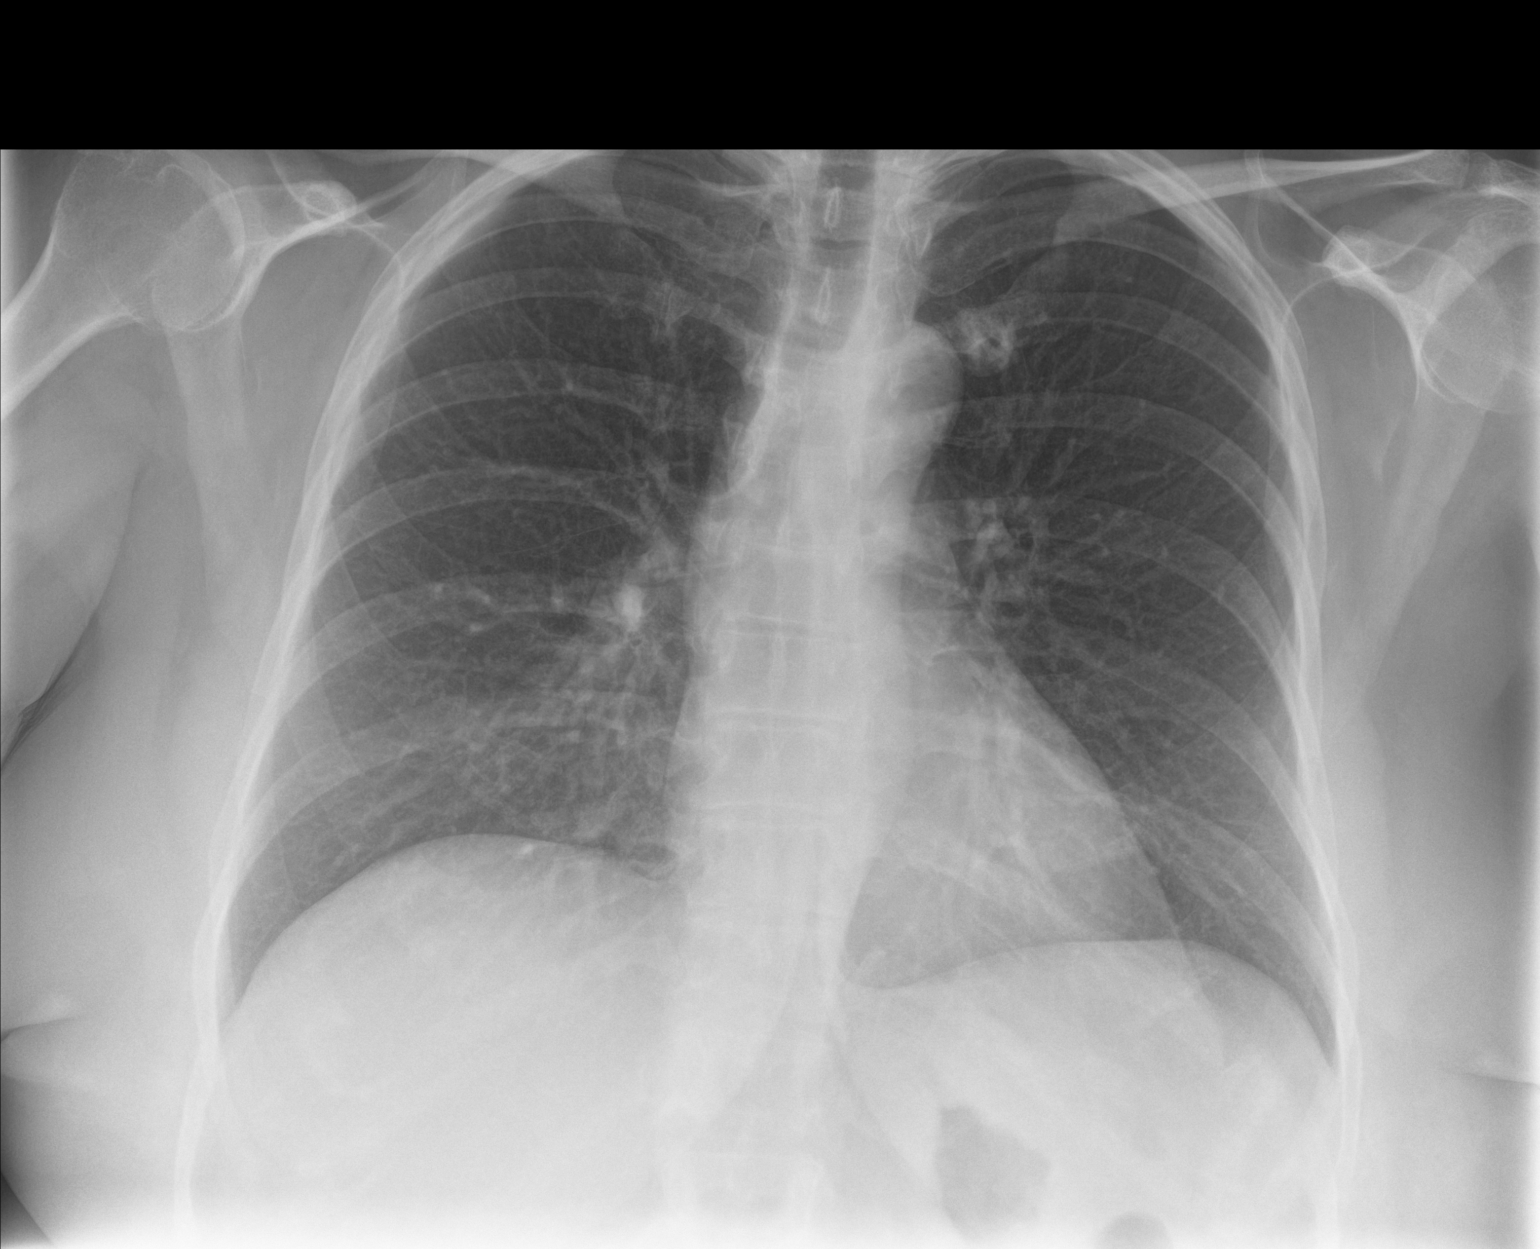

[chest lat]
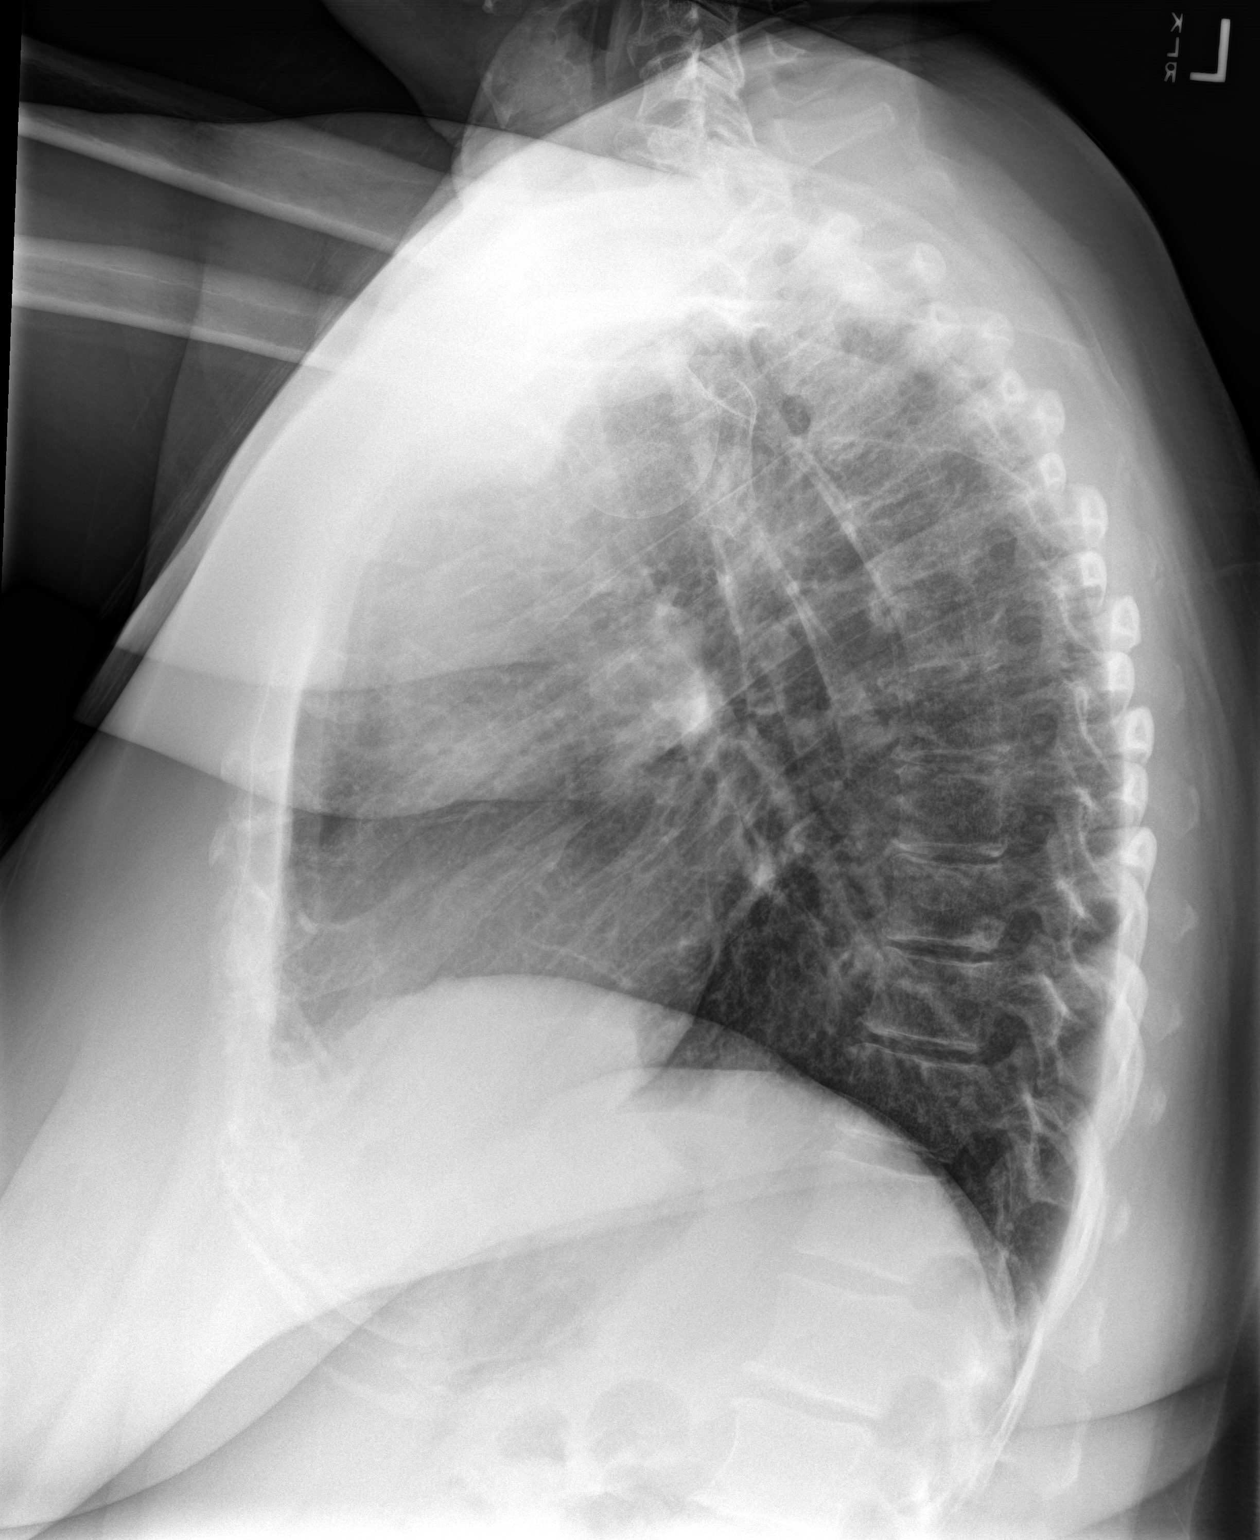

[2 of 2 positions shown; findings below may reference images not displayed]

FINDINGS: The cardiomediastinal contours are within normal limits. The lungs
are clear. No pneumothorax or pleural effusion. No acute finding in
the visualized skeleton.
IMPRESSION: No active cardiopulmonary process.

## 2023-02-18 ENCOUNTER — Other Ambulatory Visit: Payer: Self-pay | Admitting: Internal Medicine

## 2023-02-18 DIAGNOSIS — F988 Other specified behavioral and emotional disorders with onset usually occurring in childhood and adolescence: Secondary | ICD-10-CM

## 2023-02-18 DIAGNOSIS — F900 Attention-deficit hyperactivity disorder, predominantly inattentive type: Secondary | ICD-10-CM

## 2023-02-18 MED ORDER — AMPHETAMINE-DEXTROAMPHETAMINE 20 MG PO TABS
20.0000 mg | ORAL_TABLET | Freq: Two times a day (BID) | ORAL | 0 refills | Status: DC
Start: 2023-02-18 — End: 2023-03-21

## 2023-02-20 ENCOUNTER — Encounter: Payer: Self-pay | Admitting: Internal Medicine

## 2023-02-21 ENCOUNTER — Other Ambulatory Visit: Payer: Self-pay

## 2023-02-21 DIAGNOSIS — F419 Anxiety disorder, unspecified: Secondary | ICD-10-CM

## 2023-02-22 ENCOUNTER — Other Ambulatory Visit: Payer: Self-pay

## 2023-02-22 ENCOUNTER — Encounter: Payer: Self-pay | Admitting: Cardiology

## 2023-02-22 MED ORDER — VENLAFAXINE HCL ER 150 MG PO CP24: 150.0000 mg | ORAL_CAPSULE | Freq: Two times a day (BID) | ORAL | 1 refills | Status: DC

## 2023-02-24 ENCOUNTER — Other Ambulatory Visit: Payer: Self-pay

## 2023-03-15 DIAGNOSIS — S43431A Superior glenoid labrum lesion of right shoulder, initial encounter: Secondary | ICD-10-CM | POA: Diagnosis not present

## 2023-03-15 DIAGNOSIS — M19011 Primary osteoarthritis, right shoulder: Secondary | ICD-10-CM | POA: Diagnosis not present

## 2023-03-15 DIAGNOSIS — T8484XA Pain due to internal orthopedic prosthetic devices, implants and grafts, initial encounter: Secondary | ICD-10-CM | POA: Diagnosis not present

## 2023-03-15 DIAGNOSIS — Z96611 Presence of right artificial shoulder joint: Secondary | ICD-10-CM | POA: Diagnosis not present

## 2023-03-15 DIAGNOSIS — G8918 Other acute postprocedural pain: Secondary | ICD-10-CM | POA: Diagnosis not present

## 2023-03-15 DIAGNOSIS — T84038A Mechanical loosening of other internal prosthetic joint, initial encounter: Secondary | ICD-10-CM | POA: Diagnosis not present

## 2023-03-21 ENCOUNTER — Encounter: Payer: Self-pay | Admitting: Internal Medicine

## 2023-03-21 DIAGNOSIS — F900 Attention-deficit hyperactivity disorder, predominantly inattentive type: Secondary | ICD-10-CM

## 2023-03-21 MED ORDER — AMPHETAMINE-DEXTROAMPHETAMINE 20 MG PO TABS: 20.0000 mg | ORAL_TABLET | Freq: Two times a day (BID) | ORAL | 0 refills | Status: DC

## 2023-03-28 ENCOUNTER — Encounter: Payer: Self-pay | Admitting: Physical Therapy

## 2023-03-28 ENCOUNTER — Ambulatory Visit: Payer: 59 | Attending: Orthopedic Surgery | Admitting: Physical Therapy

## 2023-03-28 DIAGNOSIS — M25611 Stiffness of right shoulder, not elsewhere classified: Secondary | ICD-10-CM | POA: Diagnosis not present

## 2023-03-28 DIAGNOSIS — M6281 Muscle weakness (generalized): Secondary | ICD-10-CM | POA: Diagnosis not present

## 2023-03-28 DIAGNOSIS — M25511 Pain in right shoulder: Secondary | ICD-10-CM | POA: Diagnosis not present

## 2023-03-28 NOTE — Therapy (Incomplete)
OUTPATIENT PHYSICAL THERAPY EVALUATION   Patient Name: Anna Keller MRN: [id number] DOB:[birth year], 52 y.o., female Today's Date: 03/28/2023  END OF SESSION:   Past Medical History:  Diagnosis Date   ADHD (attention deficit hyperactivity disorder)    Ankylosing spondylitis (HCC)    Anxiety    Arthritis    Depression    Eczema    Family history of adverse reaction to anesthesia    dad would be crazy, talk out of his head-per pt   Headache    Rheumatoid arthritis (HCC)    Past Surgical History:  Procedure Laterality Date   BLADDER SUSPENSION N/A 04/14/2021   Procedure: TRANSVAGINAL TAPE (TVT) PROCEDURE;  Surgeon: Essie Hart, MD;  Location: [facility] OR;  Service: Gynecology;  Laterality: N/A;  GYNCARE TVT EXACT   CYSTOSCOPY N/A 04/14/2021   Procedure: CYSTOSCOPY;  Surgeon: Essie Hart, MD;  Location: [facility] OR;  Service: Gynecology;  Laterality: N/A;   KNEE ARTHROSCOPY Left 1988   REVERSE SHOULDER ARTHROPLASTY Right 01/05/2022   Procedure: RIGHT SHOULDER REVERSE SHOULDER ARTHROPLASTY;  Surgeon: Huel Cote, MD;  Location: [facility];  Service: Orthopedics;  Laterality: Right;   REVERSE SHOULDER ARTHROPLASTY Right 04/01/2022   Procedure: RIGHT REVERSE SHOULDER ARTHROPLASTY REVISION;  Surgeon: Huel Cote, MD;  Location: [facility];  Service: Orthopedics;  Laterality: Right;   REVERSE SHOULDER ARTHROPLASTY Right 07/22/2022   Procedure: RIGHT SHOULDER REVISION  ARTHROPLASTY;  Surgeon: Huel Cote, MD;  Location: [facility];  Service: Orthopedics;  Laterality: Right;   TONSILLECTOMY     as a child   TOTAL LAPAROSCOPIC HYSTERECTOMY WITH SALPINGECTOMY Bilateral 04/14/2021   Procedure: TOTAL LAPAROSCOPIC HYSTERECTOMY WITH BILATERAL SALPINGECTOMY AND BILATERAL OOPHERECTOMY;  Surgeon: Essie Hart, MD;  Location: [facility];  Service: Gynecology;  Laterality: Bilateral;   Patient Active Problem List   Diagnosis Date Noted   Painful tongue 12/23/2022   Chronic right shoulder pain 11/02/2022    Elevated liver enzymes 11/02/2022   Prediabetes 11/02/2022   Essential hypertension, benign 11/02/2022   Breast cancer screening by mammogram 11/02/2022   Joint inflammation 06/10/2022   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) 05/05/2022   History of reverse total replacement of right shoulder joint 04/01/2022   Unilateral primary osteoarthritis, left knee 01/27/2022   Traumatic complete tear of right rotator cuff    S/P laparoscopic hysterectomy 04/14/2021   Grief reaction 03/23/2019   Gastroesophageal reflux disease without esophagitis 03/23/2019   Constipation 03/23/2019   Eczema 03/23/2019   Pain in joint, multiple sites 07/14/2018   Family history of arthritis 07/14/2018   Peeling skin 07/14/2018   Class 3 severe obesity due to excess calories with body mass index (BMI) of 40.0 to 44.9 in adult [facility]) 07/14/2018   Anxiety 07/14/2018   Depression 07/14/2018    PCP:  Margaretann Loveless, MD  REFERRING PROVIDER: Zachery Dakins., MD  REFERRING DIAG: s/p right shoulder hemiarthroplasty (03/15/2023)  THERAPY DIAG:  No diagnosis found.  Rationale for Evaluation and Treatment: Rehabilitation  ONSET DATE: chronic but most recent surgery 03/15/2023  SUBJECTIVE:  SUBJECTIVE STATEMENT: ***  Patient states this is her 4th surgery on her right shoulder in 14 months. She had a hard time with getting into PT successfully. She states this surgery is different than her other 3 surgeries. This one hurts more and is more of a "crying" pain instead of an aching pain. She states Dr. Adah Salvage is a new surgeon for her. She wants to avoid more surgery. She states he said he cannot fix the laxity in the muscles or the pain but he said I can maybe get you where you can "get straight out"  (patient holds left shoulder in 90 degrees flexion). Patient states there has been a lot of instability in her shoulder that has prevented her from participating successfully in PT. She has not been able to abduct right shoulder more than about 20-30 degrees. Takes her sling off during the day when she is sitting and not doing anything because she read her paper and it said she could. She sleeps in the recliner sometimes but will sleep in her adjustable bed with a pillow behind her arm so his shoulder does not extend. She wears her sling at night and all the time except when she is sitting still with it supported in the chair.   She gets her first RA infusion in 3 months next week (it was held that long due to surgery).   She cannot get her left knee replaced until she can use a cane or a walker.   R fingers have been numb since last surgery.    She had to quit work and file disability last year because of her previous surgeries.    Hand dominance: Right  S/p R hemiarthroplasty on 03/15/2023 where prior TSA with multiple revisions was converted to a hemiarthroplasty (glenoid instrumentation removed, bone graft placed in glenoid, humeral head implant added).  Original TSA 01/05/2022, revision #1 on 04/01/2022, revision #2 07/22/2022. Patient continued to have ongoing pain leading to 4th surgery (current surgery) where she was found to have loose glenoid component with superior tilt and glenoid defect that lead to bone graft there and conversion to hemiarthroplasty with glenoid components removed and artificial humeral head added.   PERTINENT HISTORY: Patient is a 52 y.o. female who presents to outpatient physical therapy with a referral for medical diagnosis s/p right shoulder hemiarthroplasty (03/15/2023). This patient's chief complaints consist of right shoulder pain, instability, weakness, stiffness, and dysfunction, leading to the following functional deficits: ***. Relevant past medical history and  comorbidities include ***.  Patient denies hx of cancer, stroke, seizures, lung problems, heart problems, diabetes, unexplained weight loss, unexplained changes in bowel or bladder problems, unexplained stumbling or dropping things, osteoporosis, and spinal surgery  PAIN: Are you having pain? Yes NPRS: Current: 0/10 (in abduction sling),  Best: 0/10, Worst: 20/10. Pain location: shoots from R shoulder to R elbow Pain description: shoots from shoulder to elbow, and "feels like the insides are coming out" feels like something grinds, makes fingers numb, puts me in tears Aggravating factors: moves the wrong way, walking, going to the bathroom.  Relieving factors: tylenol, cannot take oxycodone because it makes her nausea (gave her anti-emetic but does not take)   FUNCTIONAL LIMITATIONS: unable to use her right UE, grooming, sleeping,   LEISURE: watches her grandson   PRECAUTIONS: see protocol in charge  WEIGHT BEARING RESTRICTIONS: Yes NWB R UE  FALLS:  Has patient fallen in last 6 months? No Does not go anywhere to keep herself from falling.  Her left knee buckles and she fell several times but it was over 6 months ago.   LIVING ENVIRONMENT: Lives with: daughter  Lives in: {Lives in:25570} Stairs: {opstairs:27293} Has following equipment at home: {Assistive devices:23999}  OCCUPATION: ***  PLOF: {PLOF:24004}  PATIENT GOALS:***  NEXT MD VISIT:   OBJECTIVE  DIAGNOSTIC FINDINGS:  R shoulder CT report from 12/08/2022:  CLINICAL DATA:  History of reversed total shoulder arthroplasty. Persistent shoulder pain. Preoperative study.   EXAM: CT OF THE UPPER RIGHT EXTREMITY WITHOUT CONTRAST   TECHNIQUE: Multidetector CT imaging of the upper right extremity was performed according to the standard protocol.   RADIATION DOSE REDUCTION: This exam was performed according to the departmental dose-optimization program which includes automated exposure control, adjustment of the mA  and/or kV according to patient size and/or use of iterative reconstruction technique.   COMPARISON:  CT scan 12/04/2021   FINDINGS: The humeral component is well seated. No complicating features are identified. No loosening or periprosthetic fracture. The glenosphere is widely separated from the inferior glenoid measuring approximately 11 mm. Lucency around the fixating screws consistent with loosening. There is also a fractured screw fragment in the glenoid possibly related to a prior prosthesis.   On the coronal reformatted images there appears to be a small nondisplaced fracture involving the inferior aspect of the glenoid   The prosthesis are normally articulated. The glenosphere is right up under the acromion.   Chronic fatty atrophy of the rotator cuff muscles.   The [facility] joint demonstrates stable degenerative changes.   The visualized ribs are intact and the visualized right lung is grossly clear.   IMPRESSION: 1. The glenosphere is widely separated from the inferior glenoid measuring approximately 11 mm. Lucency around the fixating screws consistent with loosening. 2. Fractured screw fragment in the glenoid possibly related to a prior prosthesis. 3. There appears to be a small nondisplaced fracture involving the inferior aspect of the glenoid. 4. The humeral component is well seated. No complicating features are identified. 5. Chronic fatty atrophy of the rotator cuff muscles.     Electronically Signed   By: Rudie Meyer M.D.   On: 12/19/2022 12:26  SELF- REPORTED FUNCTION FOTO score: ***/100 (shoulder questionnaire)  OBSERVATION/INSPECTION Posture Posture (seated): forward head, rounded shoulders, slumped in sitting.  Posture (standing): *** Posture correction: *** Anthropometrics Tremor: none Body composition: *** Muscle bulk: *** Skin: The incision sites appear to be healing well with no excessive redness, warmth, drainage or signs of infection  present.  *** Edema: *** Functional Mobility Bed mobility: *** Transfers: *** Gait: grossly WFL for household and short community ambulation. More detailed gait analysis deferred to later date as needed. *** Stairs: ***  SPINE MOTION  LUMBAR SPINE AROM *Indicates pain Flexion: *** Extension: *** Side Flexion:   R ***  L *** Rotation:  R *** L *** Side glide:  R *** L ***    NEUROLOGICAL  Upper Motor Neuron Screen Babinski, Hoffman's and Clonus (ankle) negative bilaterally.  Dermatomes C2-T1 appears equal and intact to light touch except the following: *** L2-S2 appears equal and intact to light touch except the following: *** Deep Tendon Reflexes R/L  ***+/***+ Biceps brachii reflex (C5, C6) ***+/***+ Brachioradialis reflex (C6) ***+/***+ Triceps brachii reflex (C7) ***+/***+ Quadriceps reflex (L4) ***+/***+ Achilles reflex (S1)  SPINE MOTION  CERVICAL SPINE AROM *Indicates pain Flexion: *** Extension: *** Side Flexion:   R ***  L *** Rotation:  R *** L *** Protraction: *** Retrusion: ***  PERIPHERAL JOINT MOTION (in degrees)  ACTIVE RANGE OF MOTION (AROM) *Indicates pain Date Date Date  Joint/Motion R/L R/L R/L  Shoulder     Flexion / / /  Extension / / /  Abduction  / / /  External rotation / / /  Internal rotation / / /  Elbow     Flexion  / / /  Extension  / / /  Wrist     Flexion / / /  Extension  / / /  Radial deviation / / /  Ulnar deviation / / /  Pronation / / /  Supination / / /  Hip     Flexion / / /  Extension  / / /  Abduction / / /  Adduction / / /  External rotation / / /  Internal rotation  / / /  Knee     Extension / / /  Flexoin / / /  Ankle/Foot     Dorsiflexion (knee ext) / / /  Dorsiflexion (knee flex) / / /  Plantarflexion / / /  Everison / / /  Inversion / / /  Great toe extension / / /  Great toe flexion / / /  Comments:   PASSIVE RANGE OF MOTION (PROM) *Indicates pain Date Date Date   Joint/Motion R/L R/L R/L  Shoulder     Flexion / / /  Extension / / /  Abduction  / / /  External rotation / / /  Internal rotation / / /  Elbow     Flexion  / / /  Extension  / / /  Wrist     Flexion / / /  Extension  / / /  Radial deviation / / /  Ulnar deviation / / /  Pronation / / /  Supination / / /  Hip     Flexion  / / /  Extension  / / /  Abduction / / /  Adduction / / /  External rotation / / /  Internal rotation  / / /  Knee     Extension / / /  Flexion / / /  Ankle/Foot     Dorsiflexion (knee ext) / / /  Dorsiflexion (knee flex) / / /  Plantarflexion / / /  Everison / / /  Inversion / / /  Great toe extension / / /  Great toe flexion / / /  Comments:   MUSCLE PERFORMANCE (MMT):  *Indicates pain Date Date Date  Joint/Motion R/L R/L R/L  Shoulder     Flexion / / /  Abduction (C5) / / /  External rotation / / /  Internal rotation / / /  Extension / / /  Elbow     Flexion (C6) / / /  Extension (C7) / / /  Wrist     Flexion (C7) / / /  Extension (C6) / / /  Radial deviation / / /  Ulnar deviation (C8) / / /  Pronation / / /  Supination / / /  Hand     Thumb extension (C8) / / /  Finger abduction (T1) / / /  Grip (C8) / / /  Hip     Flexion (L1, L2) / / /  Extension (knee ext) / / /  Extension (knee flex) / / /  Abduction / / /  Adduction / / /  External rotation / / /  Internal rotation  / / /  Knee     Extension (L3) / / /  Flexion (S2) / / /  Ankle/Foot     Dorsiflexion (L4) / / /  Great toe extension (L5) / / /  Eversion (S1) / / /  Plantarflexion (S1) / / /  Inversion / / /  Pronation / / /  Great toe flexion / / /  Comments:   SPECIAL TESTS:  .Neurodynamictests .NeurodynamicUE .NeurodynamicLE .CspineInstability .CSPINESPECIALTESTS .SHOULDERSPECIALTESTCLUSTERS .HIPSPECIALTESTS .SIJSPECIALTESTS   SHOULDER SPECIAL TESTS RTC, Impingement, Anterior Instability (macrotrauma), Labral Tear: Painful arc test: R = ***,  L = ***. Drop arm test: R = ***, L = ***. Hawkins-Kennedy test: R = ***, L = ***. Infraspinatus test: R = ***, L = ***. Apprehension test: R = ***, L = ***. Relocation test: R = ***, L = ***. Active compression test: R = ***, L = ***.  ACCESSORY MOTION: ***  PALPATION: ***  SUSTAINED POSITIONS TESTING:  ***  REPEATED MOTIONS TESTING: ***  FUNCTIONAL/BALANCE TESTS: Five Time Sit to Stand (5TSTS): *** seconds Functional Gait Assessment (FGA): ***/30 (see details above) Ten meter walking trial ( ): *** m/s Six Minute Walk Test ( ): *** feet Timed Up and Go (TUG): *** seconds   Dynamic Gait Index: ***/24 BERG Balance Scale: ***/56 Tinetti/POMA: ***/28 Timed Up and GO: *** seconds (average of 3 trials) Trial 1: *** Trial 2: *** Trial 3: *** Romberg test: -Narrow stance, eyes open: *** seconds -Narrow stance, eyes closed: *** seconds Sharpened Romberg test: -Tandem stance, eyes open: *** seconds -Tandem stance, eyes closed: *** seconds  Narrow stance, firm surface, eyes open: *** seconds Narrow stance, firm surface, eyes closed: *** seconds Narrow stance, compliant surface, eyes open: *** seconds Narrow stance, compliant surface, eyes closed: *** seconds Single leg stance, firm surface, eyes open: R= *** seconds, L= *** seconds Single leg stance, compliant surface, eyes open: R= *** seconds, L= *** seconds Gait speed: *** m/s Functional reach test: *** inches      TODAY'S TREATMENT:                                                                                                                                         DATE: ***   PATIENT EDUCATION: Education details: *** Person educated: {Person educated:25204} Education method: {Education Method:25205} Education comprehension: {Education Comprehension:25206}  HOME EXERCISE PROGRAM: ***  ASSESSMENT:  CLINICAL IMPRESSION: Patient is a 52 y.o. female referred to outpatient physical therapy with a  medical diagnosis of  s/p right shoulder hemiarthroplasty (03/15/2023) who presents with signs and symptoms consistent with right shoulder pain, stiffness and dysfunction s/p R hemiarthroplasty on 03/15/2023 where prior RTA with multiple revisions was converted to a hemiarthroplasty (glenoid instrumentation removed, bone graft placed in glenoid, humeral head implant added). Patient presents with significant *** impairments that are limiting ability to complete *** without difficulty. Patient will benefit from skilled physical therapy intervention to  address current body structure impairments and activity limitations to improve function and work towards goals set in current POC in order to return to prior level of function or maximal functional improvement.   OBJECTIVE IMPAIRMENTS: {opptimpairments:25111}.   ACTIVITY LIMITATIONS: {activitylimitations:27494}  PARTICIPATION LIMITATIONS: {participationrestrictions:25113}  PERSONAL FACTORS: {Personal factors:25162} are also affecting patient's functional outcome.   REHAB POTENTIAL: {rehabpotential:25112}  CLINICAL DECISION MAKING: {clinical decision making:25114}  EVALUATION COMPLEXITY: {Evaluation complexity:25115}   GOALS: Goals reviewed with patient? {yes/no:20286}  SHORT TERM GOALS: Target date: 04/11/2023  Patient will be independent with initial home exercise program for self-management of symptoms. Baseline: {HEPbaseline4:27310} (03/28/23); Goal status: INITIAL   LONG TERM GOALS: Target date: 06/20/2023  Patient will be independent with a long-term home exercise program for self-management of symptoms.  Baseline: {HEPbaseline4:27310} (03/28/23); Goal status: INITIAL  2.  Patient will demonstrate improved FOTO to equal or greater than *** by visit #*** to demonstrate improvement in overall condition and self-reported functional ability.  Baseline: *** (03/28/23); Goal status: INITIAL  3.  *** Baseline: *** (03/28/23); Goal  status: INITIAL  4.  *** Baseline: *** (03/28/23); Goal status: INITIAL  5.  Patient will demonstrate improvement in Patient Specific Functional Scale (PSFS) of equal or greater than 3 points to reflect clinically significant improvement in patient's most valued functional activities. Baseline: *** (03/28/23); Goal status: INITIAL  6.  Patient will complete community, work and/or recreational activities without limitation due to current condition.  Baseline: *** (03/28/23); Goal status: INITIAL   PLAN:  PT FREQUENCY: {rehab frequency:25116}  PT DURATION: {rehab duration:25117}  PLANNED INTERVENTIONS: {rehab planned interventions:25118::"97110-Therapeutic exercises","97530- Therapeutic [phone]- Neuromuscular re-education","97535- Self JXBJ","47829- Manual therapy"}  PLAN FOR NEXT SESSION: ***   Deklyn Gibbon R. Ilsa Iha, PT, DPT 03/28/23, 1:56 PM  [facility] [address] [place], [place] [address] P: [phone] I F: [phone]

## 2023-03-28 NOTE — Therapy (Unsigned)
OUTPATIENT PHYSICAL THERAPY SHOULDER EVALUATION   Patient Name: Anna Keller MRN: [id number] DOB:03/27/1971, 52 y.o., female Today's Date: 03/28/2023  END OF SESSION:  PT End of Session - 03/28/23 2048     Visit Number 1    Number of Visits 24    Date for PT Re-Evaluation 06/20/23    Authorization Type [employer] reporting period from 03/28/23    Authorization Time Period VL 30 PT/OT/Chiro-21 remain    Authorization - Visit Number 10    Authorization - Number of Visits 30    Progress Note Due on Visit 10    PT Start Time 1737    PT Stop Time 1817    PT Time Calculation (min) 40 min    Activity Tolerance Patient limited by pain    Behavior During Therapy Anxious;WFL for tasks assessed/performed              Past Medical History:  Diagnosis Date   ADHD (attention deficit hyperactivity disorder)    Ankylosing spondylitis (HCC)    Anxiety    Arthritis    Depression    Eczema    Family history of adverse reaction to anesthesia    dad would be crazy, talk out of his head-per pt   Headache    Rheumatoid arthritis (HCC)    Past Surgical History:  Procedure Laterality Date   BLADDER SUSPENSION N/A 04/14/2021   Procedure: TRANSVAGINAL TAPE (TVT) PROCEDURE;  Surgeon: Essie Hart, MD;  Location: [facility] OR;  Service: Gynecology;  Laterality: N/A;  GYNCARE TVT EXACT   CYSTOSCOPY N/A 04/14/2021   Procedure: CYSTOSCOPY;  Surgeon: Essie Hart, MD;  Location: [facility] OR;  Service: Gynecology;  Laterality: N/A;   KNEE ARTHROSCOPY Left 1988   REVERSE SHOULDER ARTHROPLASTY Right 01/05/2022   Procedure: RIGHT SHOULDER REVERSE SHOULDER ARTHROPLASTY;  Surgeon: Huel Cote, MD;  Location: [facility] OR;  Service: Orthopedics;  Laterality: Right;   REVERSE SHOULDER ARTHROPLASTY Right 04/01/2022   Procedure: RIGHT REVERSE SHOULDER ARTHROPLASTY REVISION;  Surgeon: Huel Cote, MD;  Location: [facility];  Service: Orthopedics;  Laterality: Right;   REVERSE SHOULDER ARTHROPLASTY Right 07/22/2022    Procedure: RIGHT SHOULDER REVISION  ARTHROPLASTY;  Surgeon: Huel Cote, MD;  Location: [facility];  Service: Orthopedics;  Laterality: Right;   TONSILLECTOMY     as a child   TOTAL LAPAROSCOPIC HYSTERECTOMY WITH SALPINGECTOMY Bilateral 04/14/2021   Procedure: TOTAL LAPAROSCOPIC HYSTERECTOMY WITH BILATERAL SALPINGECTOMY AND BILATERAL OOPHERECTOMY;  Surgeon: Essie Hart, MD;  Location: [facility];  Service: Gynecology;  Laterality: Bilateral;   Patient Active Problem List   Diagnosis Date Noted   Painful tongue 12/23/2022   Chronic right shoulder pain 11/02/2022   Elevated liver enzymes 11/02/2022   Prediabetes 11/02/2022   Essential hypertension, benign 11/02/2022   Breast cancer screening by mammogram 11/02/2022   Joint inflammation 06/10/2022   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) 05/05/2022   History of reverse total replacement of right shoulder joint 04/01/2022   Unilateral primary osteoarthritis, left knee 01/27/2022   Traumatic complete tear of right rotator cuff    S/P laparoscopic hysterectomy 04/14/2021   Grief reaction 03/23/2019   Gastroesophageal reflux disease without esophagitis 03/23/2019   Constipation 03/23/2019   Eczema 03/23/2019   Pain in joint, multiple sites 07/14/2018   Family history of arthritis 07/14/2018   Peeling skin 07/14/2018   Class 3 severe obesity due to excess calories with body mass index (BMI) of 40.0 to 44.9 in adult [facility]) 07/14/2018   Anxiety 07/14/2018  Depression 07/14/2018    PCP: Margaretann Loveless, MD  REFERRING PROVIDER: Holland Commons, MD  REFERRING DIAG: Presence of right artificial shoulder joint  THERAPY DIAG:  Right shoulder pain, unspecified chronicity  Stiffness of right shoulder, not elsewhere classified  Muscle weakness (generalized)  Rationale for Evaluation and Treatment: Rehabilitation  ONSET DATE: 03/15/2023  SUBJECTIVE:                                                                                                                                                                                        SUBJECTIVE STATEMENT: Patient is a 52 y.o. female who presents to outpatient physical therapy with a referral for medical diagnosis s/p right shoulder hemiarthroplasty.  This surgery is her 3rd revision of a right rTSA, and her previous surgeries were on 01/05/2022 (1), 04/01/2022 (2), 07/22/2022 (3), and 03/15/2023 (4).     The pain she is experiencing this time is different than other pain from previous surgeries.    Per patient report, surgeon said that the muscles and tendons are stretched and nothing could be done about that.  The doctor told her he could not fix her pain, but he could improve her ROM.   She had surgery last November, but reported the screws broke and she needed to get it redone. She said it was redone in March, but soon after the doctor said it was "unstable," which is why she got another revision.    Patient said she usually sleeps in her recliner since her surgeries but sometimes she sleeps in her bed.  When lying in bed, her husband will put pillows behind the sling so the elbow does not extend.  She reported experiencing "grinding sounds" in her shoulder, which she was scared of. She reports being "scared of the shoulder."  Hand dominance: Right  PERTINENT HISTORY: Patient is a 52 y.o. female who presents to outpatient physical therapy with a referral for medical diagnosis s/p right shoulder hemiarthroplasty. This patient's chief complaints consist of pain, weakness, and decreased ROM, leading to difficulty using her RUE.  PAIN: Are you having pain? Yes NPRS: Current: 0/10,  Best: 0/10, Worst: "20"/10 from right shoulder down to right elbow, especially when sleeping or if she moves the wrong way Pain location: At right shoulder and shoots down to right elbow Pain description: Shooting pain, feels like it's "grinding" Aggravating factors: "moving  wrong" Relieving factors: Tylenol helps  Patient also experiences numbness and tingling down the right arm  FUNCTIONAL LIMITATIONS: Patient quit work last year and filed for disability. Because she is in a sling per protocol, she cannot do much with her right arm.  OCCUPATION:  On disability due to her shoulder surgeries  LEISURE: "nothing," but mentioned watching her 35 and 27 year old grandsons  PRECAUTIONS: Shoulder (no elevation past 130 degrees, PROM only for elevation, ER in scapular plane only to 30 degrees - see post-op protocol in chart)   RED FLAGS: Patient denies hx of cancer, stroke, seizures, lung problems, heart problems, diabetes, unexplained weight loss, unexplained changes in bowel or bladder problems, unexplained stumbling or dropping things, osteoporosis, and spinal surgery  WEIGHT BEARING RESTRICTIONS: Yes No WB activities RUE  FALLS:  Has patient fallen in last 6 months? Yes. Number of falls Doesn't know number. Falls because left knee gives out. She does not go anywhere due to a fear of falling  LIVING ENVIRONMENT: Lives with: lives with their daughter   PATIENT GOALS: Use the arm for something, wash your hair without bringing head to arm, not wear sports bra, put on bra  NEXT MD VISIT:   OBJECTIVE:  Note: Objective measures were completed at Evaluation unless otherwise noted.  DIAGNOSTIC FINDINGS:  12/08/2022:  CLINICAL DATA:  History of reversed total shoulder arthroplasty. Persistent shoulder pain. Preoperative study.   EXAM: CT OF THE UPPER RIGHT EXTREMITY WITHOUT CONTRAST   TECHNIQUE: Multidetector CT imaging of the upper right extremity was performed according to the standard protocol.   RADIATION DOSE REDUCTION: This exam was performed according to the departmental dose-optimization program which includes automated exposure control, adjustment of the mA and/or kV according to patient size and/or use of iterative reconstruction technique.    COMPARISON:  CT scan 12/04/2021   FINDINGS: The humeral component is well seated. No complicating features are identified. No loosening or periprosthetic fracture. The glenosphere is widely separated from the inferior glenoid measuring approximately 11 mm. Lucency around the fixating screws consistent with loosening. There is also a fractured screw fragment in the glenoid possibly related to a prior prosthesis.   On the coronal reformatted images there appears to be a small nondisplaced fracture involving the inferior aspect of the glenoid   The prosthesis are normally articulated. The glenosphere is right up under the acromion.   Chronic fatty atrophy of the rotator cuff muscles.   The [facility] joint demonstrates stable degenerative changes.   The visualized ribs are intact and the visualized right lung is grossly clear.   IMPRESSION: 1. The glenosphere is widely separated from the inferior glenoid measuring approximately 11 mm. Lucency around the fixating screws consistent with loosening. 2. Fractured screw fragment in the glenoid possibly related to a prior prosthesis. 3. There appears to be a small nondisplaced fracture involving the inferior aspect of the glenoid. 4. The humeral component is well seated. No complicating features are identified. 5. Chronic fatty atrophy of the rotator cuff muscles.   PATIENT SURVEYS:  FOTO Will assess at visit 2  COGNITION: Overall cognitive status: Within functional limits for tasks assessed     SENSATION: Not tested  POSTURE: Kyphosis and FHP  UPPER EXTREMITY ROM:   Passive ROM Right eval Left eval  Shoulder flexion 90   Shoulder extension    Shoulder abduction 35   Shoulder adduction    Shoulder internal rotation    Shoulder external rotation To neutral   Elbow flexion    Elbow extension    Wrist flexion WFL   Wrist extension WFL   Wrist ulnar deviation    Wrist radial deviation    Wrist pronation WFL   Wrist  supination WFL   (Blank rows = not tested)  UPPER  EXTREMITY MMT:  To be assessed later per protocol  JOINT MOBILITY TESTING:  To be assessed later per protocol    TODAY'S TREATMENT:                                                                                                                                          Therapeutic exercise: to centralize symptoms and improve ROM, strength, muscular endurance, and activity tolerance required for successful completion of functional activities.   Provided and reviewed HEP (see exercises below) Education on sling use  PATIENT EDUCATION: Education details: Education on diagnosis, prognosis, POC, anatomy and physiology of current condition.   Person educated: Patient Education method: Explanation Education comprehension: verbalized understanding  HOME EXERCISE PROGRAM: Access Code: [email or web address] URL: [email or web address] Date: 03/29/2023 Prepared by: Norton Blizzard  Exercises - Seated Scapular Retraction  - 3 x daily - 20 reps - 5 seconds hold - Circular Shoulder Pendulum with Table Support  - 3 x daily - 20 reps - Horizontal Shoulder Pendulum with Table Support  - 3 x daily - 20 reps - Flexion-Extension Shoulder Pendulum with Table Support  - 3 x daily - 20 reps - Standing Elbow Flexion Extension AROM  - 3 x daily - 20 reps  ASSESSMENT:  CLINICAL IMPRESSION: Patient is a 52 y.o. female who was seen today for physical therapy evaluation and treatment s/p right shoulder hemiarthroplasty on 03/15/2023, which was her 3rd revision of a reverse TSA originally performed on 01/05/2022.  Patient was unclear about which type of surgery she had and her exam was limited by her protocol/post-op precautions.  Upon assessment, her GH joint PROM was limited by heavy muscle guarding, pain, and apprehension with movement. This muscle guarding is likely exacerbated by a history of joint instability and she frequently flinched during PROM  assessment.  The protocol from the surgeon states that the sling should be worn 24/7 except for tasks such as grooming, but patient reports taking off the sling when sitting on the couch for a few hours at a time since "it gets hot in there."  Patient received education on protocol guidelines for sling use.  Patient was shown how to do early rehab exercises such as pendulums, scap retractions, and elbow strengthening as allowable by her protocol. Patient would benefit from continued management of limiting condition by skilled physical therapist to address remaining impairments and functional limitations to work towards stated goals and return to PLOF or maximal functional independence.     OBJECTIVE IMPAIRMENTS: decreased activity tolerance, decreased balance, decreased coordination, decreased endurance, decreased knowledge of condition, decreased knowledge of use of DME, decreased mobility, difficulty walking, decreased ROM, decreased strength, hypomobility, increased edema, impaired perceived functional ability, increased muscle spasms, impaired flexibility, impaired UE functional use, improper body mechanics, obesity, and pain.   ACTIVITY LIMITATIONS: carrying, lifting, sleeping, bed mobility, bathing, dressing, reach over head, hygiene/grooming, and caring for others  PARTICIPATION LIMITATIONS: meal prep, cleaning, laundry, interpersonal relationship, driving, shopping, community activity, and occupation  PERSONAL FACTORS: Behavior pattern, Fitness, Past/current experiences, Time since onset of injury/illness/exacerbation, and 3+ comorbidities: R.A., ADHD, Ankylosing spondylitis, Anxiety, Depression, GIRD, obesity, knee buckling, HTN, pre-diabetes  are also affecting patient's functional outcome.   REHAB POTENTIAL: Fair This is her 3rd revision after 3 unsuccessful surgical attempts  CLINICAL DECISION MAKING: Evolving/moderate complexity  EVALUATION COMPLEXITY: Moderate   GOALS: Goals reviewed  with patient? No  SHORT TERM GOALS: Target date: 04/11/2023  Patient will be independent with initial home exercise program for self-management of symptoms. Baseline: Initial HEP provided at IE (03/28/23); Goal status: INITIAL   LONG TERM GOALS: Target date: 06/20/2023  Patient will be independent with a long-term home exercise program for self-management of symptoms.  Baseline: Initial HEP provided at IE (03/28/23); Goal status: INITIAL  2.  Patient will demonstrate improved FOTO by equal or greater than 10 points by visit #15 to demonstrate improvement in overall condition and self-reported functional ability.  Baseline: Will assess at visit 2 (03/28/23); Goal status: INITIAL  3.  Patient will achieve 100 degrees of active right shoulder elevation to allow her to perform ADLs with decreased limitation. Baseline: elevation PROM at 90 degrees (03/28/23); Goal status: INITIAL  4.  Patient will demonstrate 4/5 strength during MMT assessment in right shoulder elevation, extension, IR, and periscapular musculature to perform ADLs with decreased limitation. Baseline: Unable to perform MMT due to protocol (03/28/23); Goal status: INITIAL  5.  Patient will demonstrate improvement in Patient Specific Functional Scale (PSFS) of equal or greater than 3 points to reflect clinically significant improvement in patient's most valued functional activities. Baseline: Will assess at visit 2 (03/28/23); Goal status: INITIAL  PLAN:  PT FREQUENCY: 1-2x/week  PT DURATION: 12 weeks  PLANNED INTERVENTIONS: 97164- PT Re-evaluation, 97110-Therapeutic exercises, 97530- Therapeutic activity, 97112- Neuromuscular re-education, 97535- Self Care, 40981- Manual therapy, [phone]- Aquatic Therapy, 97014- Electrical stimulation (unattended), Patient/Family education, Balance training, Dry Needling, Joint mobilization, Joint manipulation, Scar mobilization, DME instructions, Cryotherapy, and Moist heat  PLAN FOR  NEXT SESSION: Test vitals, PSFS, and FOTO. Continue PROM and strengthening above and below within protocol.   Fatoumata Albaugh, Student-PT 03/29/2023, 10:19 AM   Luretha Murphy. Ilsa Iha, PT, DPT 03/29/23, 11:42 AM  [facility] [address] [place], [place] [address] P: [phone] I F: [phone]

## 2023-03-29 NOTE — Therapy (Unsigned)
OUTPATIENT PHYSICAL THERAPY TREATMENT   Patient Name: Anna Keller MRN: [id number] DOB:09-15-1970, 52 y.o., female Today's Date: 03/30/2023  END OF SESSION:  PT End of Session - 03/30/23 1735     Visit Number 2    Number of Visits 24    Date for PT Re-Evaluation 06/20/23    Authorization Type [employer] reporting period from 03/28/23    Authorization Time Period VL 30 PT/OT/Chiro-21 remain    Authorization - Visit Number 11    Authorization - Number of Visits 30    Progress Note Due on Visit 10    PT Start Time 1734    PT Stop Time 1812    PT Time Calculation (min) 38 min    Activity Tolerance Patient limited by pain    Behavior During Therapy WFL for tasks assessed/performed             Past Medical History:  Diagnosis Date   ADHD (attention deficit hyperactivity disorder)    Ankylosing spondylitis (HCC)    Anxiety    Arthritis    Depression    Eczema    Family history of adverse reaction to anesthesia    dad would be crazy, talk out of his head-per pt   Headache    Rheumatoid arthritis (HCC)    Past Surgical History:  Procedure Laterality Date   BLADDER SUSPENSION N/A 04/14/2021   Procedure: TRANSVAGINAL TAPE (TVT) PROCEDURE;  Surgeon: Essie Hart, MD;  Location: [facility] OR;  Service: Gynecology;  Laterality: N/A;  GYNCARE TVT EXACT   CYSTOSCOPY N/A 04/14/2021   Procedure: CYSTOSCOPY;  Surgeon: Essie Hart, MD;  Location: [facility] OR;  Service: Gynecology;  Laterality: N/A;   KNEE ARTHROSCOPY Left 1988   REVERSE SHOULDER ARTHROPLASTY Right 01/05/2022   Procedure: RIGHT SHOULDER REVERSE SHOULDER ARTHROPLASTY;  Surgeon: Huel Cote, MD;  Location: [facility] OR;  Service: Orthopedics;  Laterality: Right;   REVERSE SHOULDER ARTHROPLASTY Right 04/01/2022   Procedure: RIGHT REVERSE SHOULDER ARTHROPLASTY REVISION;  Surgeon: Huel Cote, MD;  Location: [facility];  Service: Orthopedics;  Laterality: Right;   REVERSE SHOULDER ARTHROPLASTY Right 07/22/2022   Procedure: RIGHT SHOULDER  REVISION  ARTHROPLASTY;  Surgeon: Huel Cote, MD;  Location: [facility];  Service: Orthopedics;  Laterality: Right;   TONSILLECTOMY     as a child   TOTAL LAPAROSCOPIC HYSTERECTOMY WITH SALPINGECTOMY Bilateral 04/14/2021   Procedure: TOTAL LAPAROSCOPIC HYSTERECTOMY WITH BILATERAL SALPINGECTOMY AND BILATERAL OOPHERECTOMY;  Surgeon: Essie Hart, MD;  Location: [facility];  Service: Gynecology;  Laterality: Bilateral;   Patient Active Problem List   Diagnosis Date Noted   Painful tongue 12/23/2022   Chronic right shoulder pain 11/02/2022   Elevated liver enzymes 11/02/2022   Prediabetes 11/02/2022   Essential hypertension, benign 11/02/2022   Breast cancer screening by mammogram 11/02/2022   Joint inflammation 06/10/2022   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) 05/05/2022   History of reverse total replacement of right shoulder joint 04/01/2022   Unilateral primary osteoarthritis, left knee 01/27/2022   Traumatic complete tear of right rotator cuff    S/P laparoscopic hysterectomy 04/14/2021   Grief reaction 03/23/2019   Gastroesophageal reflux disease without esophagitis 03/23/2019   Constipation 03/23/2019   Eczema 03/23/2019   Pain in joint, multiple sites 07/14/2018   Family history of arthritis 07/14/2018   Peeling skin 07/14/2018   Class 3 severe obesity due to excess calories with body mass index (BMI) of 40.0 to 44.9 in adult [facility]) 07/14/2018   Anxiety 07/14/2018  Depression 07/14/2018    PCP: Margaretann Loveless, MD  REFERRING PROVIDER: Holland Commons, MD  REFERRING DIAG: Presence of right artificial shoulder joint  THERAPY DIAG:  Right shoulder pain, unspecified chronicity  Stiffness of right shoulder, not elsewhere classified  Muscle weakness (generalized)  Rationale for Evaluation and Treatment: Rehabilitation  ONSET DATE: 03/15/2023  SUBJECTIVE:                                                                                                                                                                                       PERTINENT HISTORY: Patient is a 52 y.o. female who presents to outpatient physical therapy with a referral for medical diagnosis s/p right shoulder hemiarthroplasty. This patient's chief complaints consist of pain, weakness, and decreased ROM, leading to difficulty using her RUE.  SUBJECTIVE STATEMENT: Patient states she was a little sore after last PT session. She states today has been rough with elevated pain and she did not sleep well last night after not sleeping well. She states she did her HEP since initial eval.    PAIN: Are you having pain? Yes NPRS: 5/10 right shoulder   PRECAUTIONS: Shoulder (see post-op protocol in chart), falls  WEIGHT BEARING RESTRICTIONS: Yes No WB activities RUE  PATIENT GOALS: Use the arm for something, wash your hair without bringing head to arm, not wear sports bra, put on bra  NEXT MD VISIT:   OBJECTIVE  SELF-REPORTED FUNCTION FOTO score: 36/100 (shoulder questionnaire)  SELF-REPORTED FUNCTION Patient Specific Functional Scale (PSFS)  Washing hair/body: 3 Cooking/food prep: 3 Oral hygiene: 3 Average: 3   TODAY'S TREATMENT:    Therapeutic exercise: to centralize symptoms and improve ROM, strength, muscular endurance, and activity tolerance required for successful completion of functional activities.  - education on sling use and updates in protocol - seated R shoulder AAROM scaption at pulleys, 1x20 with 5 second hold (approx 58 degrees).  - seated R shoulder AAROM scaption at table, 1x20 with 5 second hold (felt better than pulleys) - seated shoulder ER at slight scaption with left elbow supported, AAROM with PVC stick, 1x20 - Education on HEP including handout   Pt required multimodal cuing for proper technique and to facilitate improved neuromuscular control, strength, range of motion, and functional ability resulting in improved performance and  form.  PATIENT EDUCATION: Education details: Exercise purpose/form. Self management techniques. Education on HEP including handout. Person educated: Patient Education method: Explanation, Demonstration, and Handouts Education comprehension: verbalized understanding, returned demonstration, verbal cues required, and needs further education  HOME EXERCISE PROGRAM: Access Code: [email or web address] URL: [email or web address] Date: 03/30/2023 Prepared by: Norton Blizzard  Exercises - Seated Scapular Retraction  - 3  x daily - 20 reps - 5 seconds hold - Circular Shoulder Pendulum with Table Support  - 3 x daily - 20 reps - Horizontal Shoulder Pendulum with Table Support  - 3 x daily - 20 reps - Flexion-Extension Shoulder Pendulum with Table Support  - 3 x daily - 20 reps - Standing Elbow Flexion Extension AROM  - 3 x daily - 20 reps - Seated Shoulder Flexion Towel Slide at Table Top Full Range of Motion  - 3 x daily - 1 sets - 20 reps - 5 seconds hold - Seated Shoulder External Rotation AAROM with Dowel  - 3 x daily - 1 sets - 20 reps - 5 seconds hold  ASSESSMENT:  CLINICAL IMPRESSION: Patient arrives with right shoulder in sling with adductor pillow with similar discomfort to previous visit. Today's session focused on teaching AAROM for shoulder elevation (max 130 degrees) and ER (max 30 degrees) in the scapular plane using pulleys, table slide, and stick (per protocol) and adding it to her her HEP. Patient demonstrated good understanding of how to perform exercises and was limited by pain at her anterior shoulder. She was limited to under 90 degrees elevation but was able to ER the shoulder to limit placed by protocol. Patient also educated on update to sling restrictions in protocol and abductor pillow was removed. Patient tolerated session well overall with pain and guarding limiting overhead motion. Patient would benefit from continued management of limiting condition by skilled physical  therapist to address remaining impairments and functional limitations to work towards stated goals and return to PLOF or maximal functional independence.   From Initial PT Evaluation on 03/28/2023:  Patient is a 52 y.o. female who was seen today for physical therapy evaluation and treatment s/p right shoulder hemiarthroplasty on 03/15/2023, which was her 3rd revision of a reverse TSA originally performed on 01/05/2022. Patient was unclear about which type of surgery she had and her exam was limited by her protocol/post-op precautions.  Upon assessment, her GH joint PROM was limited by heavy muscle guarding, pain, and apprehension with movement. This muscle guarding is likely exacerbated by a history of joint instability and she frequently flinched during PROM assessment.  The protocol from the surgeon states that the sling should be worn 24/7 except for tasks such as grooming, but patient reports taking off the sling when sitting on the couch for a few hours at a time since "it gets hot in there."  Patient received education on protocol guidelines for sling use.  Patient was shown how to do early rehab exercises such as pendulums, scap retractions, and elbow strengthening as allowable by her protocol. Patient would benefit from continued management of limiting condition by skilled physical therapist to address remaining impairments and functional limitations to work towards stated goals and return to PLOF or maximal functional independence.     OBJECTIVE IMPAIRMENTS: decreased activity tolerance, decreased balance, decreased coordination, decreased endurance, decreased knowledge of condition, decreased knowledge of use of DME, decreased mobility, difficulty walking, decreased ROM, decreased strength, hypomobility, increased edema, impaired perceived functional ability, increased muscle spasms, impaired flexibility, impaired UE functional use, improper body mechanics, obesity, and pain.   ACTIVITY LIMITATIONS:  carrying, lifting, sleeping, bed mobility, bathing, dressing, reach over head, hygiene/grooming, and caring for others  PARTICIPATION LIMITATIONS: meal prep, cleaning, laundry, interpersonal relationship, driving, shopping, community activity, and occupation  PERSONAL FACTORS: Behavior pattern, Fitness, Past/current experiences, Time since onset of injury/illness/exacerbation, and 3+ comorbidities: R.A., ADHD, Ankylosing spondylitis, Anxiety, Depression, GIRD, obesity,  knee buckling, HTN, pre-diabetes  are also affecting patient's functional outcome.   REHAB POTENTIAL: Fair This is her 3rd revision after 3 unsuccessful surgical attempts  CLINICAL DECISION MAKING: Evolving/moderate complexity  EVALUATION COMPLEXITY: Moderate   GOALS: Goals reviewed with patient? No  SHORT TERM GOALS: Target date: 04/11/2023  Patient will be independent with initial home exercise program for self-management of symptoms. Baseline: Initial HEP provided at IE (03/28/23); Goal status: INITIAL   LONG TERM GOALS: Target date: 06/20/2023  Patient will be independent with a long-term home exercise program for self-management of symptoms.  Baseline: Initial HEP provided at IE (03/28/23); Goal status: INITIAL  2.  Patient will demonstrate improved FOTO by equal or greater than 10 points by visit #15 to demonstrate improvement in overall condition and self-reported functional ability.  Baseline: Will assess at visit 2 (03/28/23); 36 at visit #2 (03/30/2023);  Goal status: INITIAL  3.  Patient will achieve 100 degrees of active right shoulder elevation to allow her to perform ADLs with decreased limitation. Baseline: elevation PROM at 90 degrees (03/28/23); Goal status: INITIAL  4.  Patient will demonstrate 4/5 strength during MMT assessment in right shoulder elevation, extension, IR, and periscapular musculature to perform ADLs with decreased limitation. Baseline: Unable to perform MMT due to protocol  (03/28/23); Goal status: INITIAL  5.  Patient will demonstrate improvement in Patient Specific Functional Scale (PSFS) of equal or greater than 3 points to reflect clinically significant improvement in patient's most valued functional activities. Baseline: Will assess at visit 2 (03/28/23); 3 at visit #2 (03/30/2023);  Goal status: In-progress  PLAN:  PT FREQUENCY: 1-2x/week  PT DURATION: 12 weeks  PLANNED INTERVENTIONS: 97164- PT Re-evaluation, 97110-Therapeutic exercises, 97530- Therapeutic activity, 97112- Neuromuscular re-education, 97535- Self Care, [id number]- Manual therapy, [phone]- Aquatic Therapy, 97014- Electrical stimulation (unattended), Patient/Family education, Balance training, Dry Needling, Joint mobilization, Joint manipulation, Scar mobilization, DME instructions, Cryotherapy, and Moist heat  PLAN FOR NEXT SESSION: Test vitals, Continue PROM and strengthening above and below within protocol.   Luretha Murphy. Ilsa Iha, PT, DPT 03/30/23, 7:33 PM  [facility] [address] [place], [place] [address] P: [phone] I F: [phone]

## 2023-03-30 ENCOUNTER — Ambulatory Visit: Payer: 59 | Admitting: Physical Therapy

## 2023-03-30 ENCOUNTER — Encounter: Payer: Self-pay | Admitting: Physical Therapy

## 2023-03-30 DIAGNOSIS — M6281 Muscle weakness (generalized): Secondary | ICD-10-CM | POA: Diagnosis not present

## 2023-03-30 DIAGNOSIS — M25611 Stiffness of right shoulder, not elsewhere classified: Secondary | ICD-10-CM | POA: Diagnosis not present

## 2023-03-30 DIAGNOSIS — M25511 Pain in right shoulder: Secondary | ICD-10-CM

## 2023-04-02 ENCOUNTER — Other Ambulatory Visit: Payer: Self-pay | Admitting: Urology

## 2023-04-02 ENCOUNTER — Other Ambulatory Visit: Payer: Self-pay | Admitting: Internal Medicine

## 2023-04-02 DIAGNOSIS — G4700 Insomnia, unspecified: Secondary | ICD-10-CM

## 2023-04-04 ENCOUNTER — Ambulatory Visit: Payer: 59 | Admitting: Physical Therapy

## 2023-04-05 ENCOUNTER — Other Ambulatory Visit: Payer: 59

## 2023-04-05 ENCOUNTER — Other Ambulatory Visit: Payer: Self-pay

## 2023-04-05 DIAGNOSIS — R748 Abnormal levels of other serum enzymes: Secondary | ICD-10-CM

## 2023-04-05 DIAGNOSIS — R7303 Prediabetes: Secondary | ICD-10-CM | POA: Diagnosis not present

## 2023-04-05 DIAGNOSIS — I10 Essential (primary) hypertension: Secondary | ICD-10-CM

## 2023-04-05 MED ORDER — SOLIFENACIN SUCCINATE 10 MG PO TABS: 10.0000 mg | ORAL_TABLET | Freq: Every day | ORAL | 0 refills | Status: DC

## 2023-04-06 ENCOUNTER — Ambulatory Visit: Payer: 59 | Admitting: Physical Therapy

## 2023-04-06 DIAGNOSIS — G894 Chronic pain syndrome: Secondary | ICD-10-CM | POA: Diagnosis not present

## 2023-04-06 DIAGNOSIS — M47816 Spondylosis without myelopathy or radiculopathy, lumbar region: Secondary | ICD-10-CM | POA: Diagnosis not present

## 2023-04-06 DIAGNOSIS — G8929 Other chronic pain: Secondary | ICD-10-CM | POA: Diagnosis not present

## 2023-04-06 DIAGNOSIS — M25511 Pain in right shoulder: Secondary | ICD-10-CM | POA: Diagnosis not present

## 2023-04-06 DIAGNOSIS — G90511 Complex regional pain syndrome I of right upper limb: Secondary | ICD-10-CM | POA: Diagnosis not present

## 2023-04-06 DIAGNOSIS — Z96611 Presence of right artificial shoulder joint: Secondary | ICD-10-CM | POA: Diagnosis not present

## 2023-04-06 LAB — CBC WITH DIFFERENTIAL/PLATELET
Basophils Absolute: 0.1 10*3/uL (ref 0.0–0.2)
Basos: 1 %
EOS (ABSOLUTE): 0.3 10*3/uL (ref 0.0–0.4)
Eos: 5 %
Hematocrit: 35.3 % (ref 34.0–46.6)
Hemoglobin: 11.2 g/dL (ref 11.1–15.9)
Immature Grans (Abs): 0 10*3/uL (ref 0.0–0.1)
Immature Granulocytes: 0 %
Lymphocytes Absolute: 2.8 10*3/uL (ref 0.7–3.1)
Lymphs: 42 %
MCH: 27.1 pg (ref 26.6–33.0)
MCHC: 31.7 g/dL (ref 31.5–35.7)
MCV: 85 fL (ref 79–97)
Monocytes Absolute: 0.5 10*3/uL (ref 0.1–0.9)
Monocytes: 8 %
Neutrophils Absolute: 2.9 10*3/uL (ref 1.4–7.0)
Neutrophils: 44 %
Platelets: 335 10*3/uL (ref 150–450)
RBC: 4.14 x10E6/uL (ref 3.77–5.28)
RDW: 15.6 % — ABNORMAL HIGH (ref 11.7–15.4)
WBC: 6.7 10*3/uL (ref 3.4–10.8)

## 2023-04-06 LAB — CMP14+EGFR
ALT: 30 [IU]/L (ref 0–32)
AST: 31 [IU]/L (ref 0–40)
Albumin: 4.1 g/dL (ref 3.8–4.9)
Alkaline Phosphatase: 192 [IU]/L — ABNORMAL HIGH (ref 44–121)
BUN/Creatinine Ratio: 17 (ref 9–23)
BUN: 13 mg/dL (ref 6–24)
Bilirubin Total: 0.4 mg/dL (ref 0.0–1.2)
CO2: 23 mmol/L (ref 20–29)
Calcium: 9.5 mg/dL (ref 8.7–10.2)
Chloride: 102 mmol/L (ref 96–106)
Creatinine, Ser: 0.78 mg/dL (ref 0.57–1.00)
Globulin, Total: 2.4 g/dL (ref 1.5–4.5)
Glucose: 94 mg/dL (ref 70–99)
Potassium: 4.4 mmol/L (ref 3.5–5.2)
Sodium: 141 mmol/L (ref 134–144)
Total Protein: 6.5 g/dL (ref 6.0–8.5)
eGFR: 91 mL/min/{1.73_m2} (ref >59)

## 2023-04-06 LAB — TSH: TSH: 1.28 u[IU]/mL (ref 0.450–4.500)

## 2023-04-06 LAB — LIPID PANEL
Chol/HDL Ratio: 2.5 {ratio} (ref 0.0–4.4)
Cholesterol, Total: 147 mg/dL (ref 100–199)
HDL: 59 mg/dL (ref >39)
LDL Chol Calc (NIH): 68 mg/dL (ref 0–99)
Triglycerides: 111 mg/dL (ref 0–149)
VLDL Cholesterol Cal: 20 mg/dL (ref 5–40)

## 2023-04-06 LAB — HEMOGLOBIN A1C
Est. average glucose Bld gHb Est-mCnc: 120 mg/dL
Hgb A1c MFr Bld: 5.8 % — ABNORMAL HIGH (ref 4.8–5.6)

## 2023-04-07 ENCOUNTER — Other Ambulatory Visit: Payer: Self-pay | Admitting: Cardiology

## 2023-04-07 ENCOUNTER — Encounter: Payer: Self-pay | Admitting: Cardiology

## 2023-04-07 ENCOUNTER — Ambulatory Visit: Payer: 59 | Admitting: Cardiology

## 2023-04-07 VITALS — BP 124/86 | HR 98 | Ht 67.0 in | Wt 285.0 lb

## 2023-04-07 DIAGNOSIS — E66813 Obesity, class 3: Secondary | ICD-10-CM | POA: Diagnosis not present

## 2023-04-07 DIAGNOSIS — R7303 Prediabetes: Secondary | ICD-10-CM

## 2023-04-07 DIAGNOSIS — Z6841 Body Mass Index (BMI) 40.0 and over, adult: Secondary | ICD-10-CM

## 2023-04-07 DIAGNOSIS — I10 Essential (primary) hypertension: Secondary | ICD-10-CM | POA: Diagnosis not present

## 2023-04-07 DIAGNOSIS — Z1211 Encounter for screening for malignant neoplasm of colon: Secondary | ICD-10-CM | POA: Diagnosis not present

## 2023-04-07 MED ORDER — TRULICITY 0.75 MG/0.5ML ~~LOC~~ SOAJ: 0.7500 mg | SUBCUTANEOUS | 6 refills | Status: DC

## 2023-04-07 NOTE — Progress Notes (Signed)
Established Patient Office Visit  Subjective:  Patient ID: Anna Keller, female    DOB: 25-Apr-1971  Age: 52 y.o. MRN: [id number]  Chief Complaint  Patient presents with   Follow-up    Follow up and discuss lab results    Patient in office for regular follow up, discuss recent lab work. No new complaints today. Hgb A1c improved, LDL at goal. Alk phos improved, follow up with liver doctor. Patient has never had a colon cancer screening, will send a referral.  Recent right shoulder surgery, follow up with surgeon.     No other concerns at this time.   Past Medical History:  Diagnosis Date   ADHD (attention deficit hyperactivity disorder)    Ankylosing spondylitis (HCC)    Anxiety    Arthritis    Depression    Eczema    Family history of adverse reaction to anesthesia    dad would be crazy, talk out of his head-per pt   Headache    Rheumatoid arthritis (HCC)     Past Surgical History:  Procedure Laterality Date   BLADDER SUSPENSION N/A 04/14/2021   Procedure: TRANSVAGINAL TAPE (TVT) PROCEDURE;  Surgeon: Essie Hart, MD;  Location: [facility] OR;  Service: Gynecology;  Laterality: N/A;  GYNCARE TVT EXACT   CYSTOSCOPY N/A 04/14/2021   Procedure: CYSTOSCOPY;  Surgeon: Essie Hart, MD;  Location: [facility] OR;  Service: Gynecology;  Laterality: N/A;   KNEE ARTHROSCOPY Left 1988   REVERSE SHOULDER ARTHROPLASTY Right 01/05/2022   Procedure: RIGHT SHOULDER REVERSE SHOULDER ARTHROPLASTY;  Surgeon: Huel Cote, MD;  Location: [facility] OR;  Service: Orthopedics;  Laterality: Right;   REVERSE SHOULDER ARTHROPLASTY Right 04/01/2022   Procedure: RIGHT REVERSE SHOULDER ARTHROPLASTY REVISION;  Surgeon: Huel Cote, MD;  Location: [facility];  Service: Orthopedics;  Laterality: Right;   REVERSE SHOULDER ARTHROPLASTY Right 07/22/2022   Procedure: RIGHT SHOULDER REVISION  ARTHROPLASTY;  Surgeon: Huel Cote, MD;  Location: [facility];  Service: Orthopedics;  Laterality: Right;   TONSILLECTOMY     as a  child   TOTAL LAPAROSCOPIC HYSTERECTOMY WITH SALPINGECTOMY Bilateral 04/14/2021   Procedure: TOTAL LAPAROSCOPIC HYSTERECTOMY WITH BILATERAL SALPINGECTOMY AND BILATERAL OOPHERECTOMY;  Surgeon: Essie Hart, MD;  Location: [facility];  Service: Gynecology;  Laterality: Bilateral;    Social History   Socioeconomic History   Marital status: Married    Spouse name: Casimiro Needle   Number of children: 1   Years of education: 12   Highest education level: High school graduate  Occupational History   Not on file  Tobacco Use   Smoking status: Former    Current packs/day: 0.00    Average packs/day: 1 pack/day for 25.0 years (25.0 ttl pk-yrs)    Types: Cigarettes    Start date: 01/09/1991    Quit date: 01/09/2016    Years since quitting: 7.2   Smokeless tobacco: Never  Vaping Use   Vaping status: Never Used  Substance and Sexual Activity   Alcohol use: Not Currently   Drug use: No   Sexual activity: Yes    Partners: Male    Birth control/protection: None  Other Topics Concern   Not on file  Social History Narrative   Not on file   Social Determinants of Health   Financial Resource Strain: Low Risk  (07/14/2018)   Overall Financial Resource Strain (CARDIA)    Difficulty of Paying Living Expenses: Not hard at all  Food Insecurity: No Food Insecurity (07/14/2018)   Hunger Vital Sign    Worried About  Running Out of Food in the Last Year: Never true    Ran Out of Food in the Last Year: Never true  Transportation Needs: No Transportation Needs (04/02/2022)   PRAPARE - Administrator, Civil Service (Medical): No    Lack of Transportation (Non-Medical): No  Physical Activity: Inactive (07/14/2018)   Exercise Vital Sign    Days of Exercise per Week: 0 days    Minutes of Exercise per Session: 0 min  Stress: No Stress Concern Present (07/14/2018)   [employer] of Occupational Health - Occupational Stress Questionnaire    Feeling of Stress : Only a little  Social Connections: Somewhat  Isolated (07/14/2018)   Social Connection and Isolation Panel [NHANES]    Frequency of Communication with Friends and Family: More than three times a week    Frequency of Social Gatherings with Friends and Family: More than three times a week    Attends Religious Services: Never    Database administrator or Organizations: No    Attends Banker Meetings: Never    Marital Status: Married  Catering manager Violence: Not At Risk (04/02/2022)   Humiliation, Afraid, Rape, and Kick questionnaire    Fear of Current or Ex-Partner: No    Emotionally Abused: No    Physically Abused: No    Sexually Abused: No    Family History  Problem Relation Age of Onset   Hypertension Mother    Arthritis Father    Heart disease Sister    Pulmonary Hypertension Sister    COPD Sister    Diabetes Sister    Breast cancer Maternal Grandmother    Heart attack Paternal Grandmother    Breast cancer Maternal Aunt    Breast cancer Cousin     Allergies  Allergen Reactions   Codeine Nausea And Vomiting    Outpatient Medications Prior to Visit  Medication Sig   Abatacept (ORENCIA IV) Inject 1 Units into the vein every 30 (thirty) days.   ALPRAZolam (XANAX) 1 MG tablet Take 1 tablet (1 mg total) by mouth 3 (three) times daily as needed for anxiety.   amLODipine (NORVASC) 5 MG tablet Take 1 tablet (5 mg total) by mouth daily.   amphetamine-dextroamphetamine (ADDERALL) 20 MG tablet Take 1 tablet (20 mg total) by mouth 2 (two) times daily.   clobetasol ointment (TEMOVATE) 0.05 % Apply topically 2 (two) times daily.   diclofenac (VOLTAREN) 50 MG EC tablet TAKE 1 TABLET BY MOUTH TWICE DAILY WITH FOOD AND LARGE GLASS OF WATER WITH EACH DOSE(STOP CELECOXIB)   doxepin (SINEQUAN) 25 MG capsule TAKE 1 CAPSULE BY MOUTH EVERYDAY AT BEDTIME   FLUoxetine (PROZAC) 40 MG capsule Take 40 mg by mouth 2 (two) times daily.   gabapentin (NEURONTIN) 600 MG tablet Take 600 mg by mouth 2 (two) times daily.    levocetirizine (XYZAL) 5 MG tablet Take 5 mg by mouth every evening.   Multiple Vitamins-Minerals (MULTIVITAMIN WITH MINERALS) tablet Take 1 tablet by mouth daily.   omeprazole (PRILOSEC) 20 MG capsule Take 1 capsule by mouth once daily   polyethylene glycol powder (GLYCOLAX/MIRALAX) 17 GM/SCOOP powder Take 17 g by mouth daily. (Patient taking differently: Take 17 g by mouth daily as needed for mild constipation or moderate constipation.)   rosuvastatin (CRESTOR) 40 MG tablet TAKE 1 TABLET BY MOUTH ONCE EVERY EVENING   solifenacin (VESICARE) 10 MG tablet Take 1 tablet (10 mg total) by mouth daily.   traMADol (ULTRAM) 50 MG tablet TAKE 2  TABLETS (100 MG TOTAL) BY MOUTH EVERY 12 (TWELVE) HOURS AS NEEDED.   venlafaxine XR (EFFEXOR-XR) 150 MG 24 hr capsule Take 1 capsule (150 mg total) by mouth 2 (two) times daily.   [DISCONTINUED] Dulaglutide (TRULICITY) 0.75 MG/0.5ML SOPN Inject 0.75 mg into the skin once a week.   [DISCONTINUED] gabapentin (NEURONTIN) 300 MG capsule Take 300 mg by mouth as directed. (Patient not taking: Reported on 04/07/2023)   No facility-administered medications prior to visit.    Review of Systems  Constitutional: Negative.   HENT: Negative.    Eyes: Negative.   Respiratory: Negative.  Negative for shortness of breath.   Cardiovascular: Negative.  Negative for chest pain.  Gastrointestinal: Negative.  Negative for abdominal pain, constipation and diarrhea.  Genitourinary: Negative.   Musculoskeletal:  Negative for joint pain and myalgias.  Skin: Negative.   Neurological: Negative.  Negative for dizziness and headaches.  Endo/Heme/Allergies: Negative.   All other systems reviewed and are negative.      Objective:   BP 124/86   Pulse 98   Ht 5\' 7"  (1.702 m)   Wt 285 lb (129.3 kg)   LMP 03/24/2021   SpO2 94%   BMI 44.64 kg/m   Vitals:   04/07/23 1357  BP: 124/86  Pulse: 98  Height: 5\' 7"  (1.702 m)  Weight: 285 lb (129.3 kg)  SpO2: 94%  BMI  (Calculated): 44.63    Physical Exam Vitals and nursing note reviewed.  Constitutional:      Appearance: Normal appearance. She is normal weight.  HENT:     Head: Normocephalic and atraumatic.     Nose: Nose normal.     Mouth/Throat:     Mouth: Mucous membranes are moist.  Eyes:     Extraocular Movements: Extraocular movements intact.     Conjunctiva/sclera: Conjunctivae normal.     Pupils: Pupils are equal, round, and reactive to light.  Cardiovascular:     Rate and Rhythm: Normal rate and regular rhythm.     Pulses: Normal pulses.     Heart sounds: Normal heart sounds.  Pulmonary:     Effort: Pulmonary effort is normal.     Breath sounds: Normal breath sounds.  Abdominal:     General: Abdomen is flat. Bowel sounds are normal.     Palpations: Abdomen is soft.  Musculoskeletal:        General: Normal range of motion.     Cervical back: Normal range of motion.  Skin:    General: Skin is warm and dry.  Neurological:     General: No focal deficit present.     Mental Status: She is alert and oriented to person, place, and time.  Psychiatric:        Mood and Affect: Mood normal.        Behavior: Behavior normal.        Thought Content: Thought content normal.        Judgment: Judgment normal.      No results found for any visits on 04/07/23.  Recent Results (from the past 2160 hour(s))  CBC with Differential/Platelet     Status: Abnormal   Collection Time: 04/05/23 10:13 AM  Result Value Ref Range   WBC 6.7 3.4 - 10.8 x10E3/uL   RBC 4.14 3.77 - 5.28 x10E6/uL   Hemoglobin 11.2 11.1 - 15.9 g/dL   Hematocrit 25.3 66.4 - 46.6 %   MCV 85 79 - 97 fL   MCH 27.1 26.6 - 33.0 pg   MCHC  31.7 31.5 - 35.7 g/dL   RDW 40.9 (H) 81.1 - 91.4 %   Platelets 335 150 - 450 x10E3/uL   Neutrophils 44 Not Estab. %   Lymphs 42 Not Estab. %   Monocytes 8 Not Estab. %   Eos 5 Not Estab. %   Basos 1 Not Estab. %   Neutrophils Absolute 2.9 1.4 - 7.0 x10E3/uL   Lymphocytes Absolute 2.8  0.7 - 3.1 x10E3/uL   Monocytes Absolute 0.5 0.1 - 0.9 x10E3/uL   EOS (ABSOLUTE) 0.3 0.0 - 0.4 x10E3/uL   Basophils Absolute 0.1 0.0 - 0.2 x10E3/uL   Immature Granulocytes 0 Not Estab. %   Immature Grans (Abs) 0.0 0.0 - 0.1 x10E3/uL  CMP14+EGFR     Status: Abnormal   Collection Time: 04/05/23 10:13 AM  Result Value Ref Range   Glucose 94 70 - 99 mg/dL   BUN 13 6 - 24 mg/dL   Creatinine, Ser 7.82 0.57 - 1.00 mg/dL   eGFR 91 >95 AO/ZHY/8.65   BUN/Creatinine Ratio 17 9 - 23   Sodium 141 134 - 144 mmol/L   Potassium 4.4 3.5 - 5.2 mmol/L   Chloride 102 96 - 106 mmol/L   CO2 23 20 - 29 mmol/L   Calcium 9.5 8.7 - 10.2 mg/dL   Total Protein 6.5 6.0 - 8.5 g/dL   Albumin 4.1 3.8 - 4.9 g/dL   Globulin, Total 2.4 1.5 - 4.5 g/dL   Bilirubin Total 0.4 0.0 - 1.2 mg/dL   Alkaline Phosphatase 192 (H) 44 - 121 IU/L   AST 31 0 - 40 IU/L   ALT 30 0 - 32 IU/L  Lipid panel     Status: None   Collection Time: 04/05/23 10:13 AM  Result Value Ref Range   Cholesterol, Total 147 100 - 199 mg/dL   Triglycerides 784 0 - 149 mg/dL   HDL 59 >69 mg/dL   VLDL Cholesterol Cal 20 5 - 40 mg/dL   LDL Chol Calc (NIH) 68 0 - 99 mg/dL   Chol/HDL Ratio 2.5 0.0 - 4.4 ratio    Comment:                                   T. Chol/HDL Ratio                                             Men  Women                               1/2 Avg.Risk  3.4    3.3                                   Avg.Risk  5.0    4.4                                2X Avg.Risk  9.6    7.1                                3X Avg.Risk 23.4   11.0  Hemoglobin A1c     Status: Abnormal   Collection Time: 04/05/23 10:13 AM  Result Value Ref Range   Hgb A1c MFr Bld 5.8 (H) 4.8 - 5.6 %    Comment:          Prediabetes: 5.7 - 6.4          Diabetes: >6.4          Glycemic control for adults with diabetes: <7.0    Est. average glucose Bld gHb Est-mCnc 120 mg/dL  TSH     Status: None   Collection Time: 04/05/23 10:13 AM  Result Value Ref Range   TSH 1.280  0.450 - 4.500 uIU/mL      Assessment & Plan:  Blood work at goal.  Schedule appointment with liver doctor.  Referral sent for colonoscopy.   Problem List Items Addressed This Visit       Cardiovascular and Mediastinum   Essential hypertension, benign     Other   Class 3 severe obesity due to excess calories with body mass index (BMI) of 40.0 to 44.9 in adult [facility])   Relevant Medications   Dulaglutide (TRULICITY) 0.75 MG/0.5ML SOAJ   Prediabetes   Relevant Medications   Dulaglutide (TRULICITY) 0.75 MG/0.5ML SOAJ   Other Visit Diagnoses     Colon cancer screening    -  Primary   Relevant Orders   Ambulatory referral to Gastroenterology       Return in about 3 months (around 07/06/2023).   Total time spent: 25 minutes  [employer], NP  04/07/2023   This document may have been prepared by Dragon Voice Recognition software and as such may include unintentional dictation errors.

## 2023-04-07 NOTE — Patient Instructions (Signed)
Drazek, Stoney Bang, NP  [address]  [address]  [place], [place] [address]  [phone] (Work)  [phone] Engineer, production)

## 2023-04-11 ENCOUNTER — Encounter: Payer: Self-pay | Admitting: Physical Therapy

## 2023-04-11 ENCOUNTER — Ambulatory Visit: Payer: 59 | Attending: Orthopedic Surgery | Admitting: Physical Therapy

## 2023-04-11 DIAGNOSIS — M25611 Stiffness of right shoulder, not elsewhere classified: Secondary | ICD-10-CM | POA: Diagnosis not present

## 2023-04-11 DIAGNOSIS — M25511 Pain in right shoulder: Secondary | ICD-10-CM | POA: Diagnosis not present

## 2023-04-11 DIAGNOSIS — M6281 Muscle weakness (generalized): Secondary | ICD-10-CM | POA: Insufficient documentation

## 2023-04-11 NOTE — Therapy (Signed)
OUTPATIENT PHYSICAL THERAPY TREATMENT   Patient Name: Anna Keller MRN: [id number] DOB:05-29-1970, 52 y.o., female Today's Date: 04/11/2023  END OF SESSION:  PT End of Session - 04/11/23 1129     Visit Number 3    Number of Visits 24    Date for PT Re-Evaluation 06/20/23    Authorization Type [employer] reporting period from 03/28/23    Authorization Time Period VL 30 PT/OT/Chiro-21 remain    Authorization - Visit Number 12    Authorization - Number of Visits 30    Progress Note Due on Visit 10    PT Start Time 1121    PT Stop Time 1200    PT Time Calculation (min) 39 min    Activity Tolerance Patient limited by pain    Behavior During Therapy WFL for tasks assessed/performed              Past Medical History:  Diagnosis Date   ADHD (attention deficit hyperactivity disorder)    Ankylosing spondylitis (HCC)    Anxiety    Arthritis    Depression    Eczema    Family history of adverse reaction to anesthesia    dad would be crazy, talk out of his head-per pt   Headache    Rheumatoid arthritis (HCC)    Past Surgical History:  Procedure Laterality Date   BLADDER SUSPENSION N/A 04/14/2021   Procedure: TRANSVAGINAL TAPE (TVT) PROCEDURE;  Surgeon: Essie Hart, MD;  Location: [facility] OR;  Service: Gynecology;  Laterality: N/A;  GYNCARE TVT EXACT   CYSTOSCOPY N/A 04/14/2021   Procedure: CYSTOSCOPY;  Surgeon: Essie Hart, MD;  Location: [facility] OR;  Service: Gynecology;  Laterality: N/A;   KNEE ARTHROSCOPY Left 1988   REVERSE SHOULDER ARTHROPLASTY Right 01/05/2022   Procedure: RIGHT SHOULDER REVERSE SHOULDER ARTHROPLASTY;  Surgeon: Huel Cote, MD;  Location: [facility] OR;  Service: Orthopedics;  Laterality: Right;   REVERSE SHOULDER ARTHROPLASTY Right 04/01/2022   Procedure: RIGHT REVERSE SHOULDER ARTHROPLASTY REVISION;  Surgeon: Huel Cote, MD;  Location: [facility];  Service: Orthopedics;  Laterality: Right;   REVERSE SHOULDER ARTHROPLASTY Right 07/22/2022   Procedure: RIGHT  SHOULDER REVISION  ARTHROPLASTY;  Surgeon: Huel Cote, MD;  Location: [facility];  Service: Orthopedics;  Laterality: Right;   TONSILLECTOMY     as a child   TOTAL LAPAROSCOPIC HYSTERECTOMY WITH SALPINGECTOMY Bilateral 04/14/2021   Procedure: TOTAL LAPAROSCOPIC HYSTERECTOMY WITH BILATERAL SALPINGECTOMY AND BILATERAL OOPHERECTOMY;  Surgeon: Essie Hart, MD;  Location: [facility];  Service: Gynecology;  Laterality: Bilateral;   Patient Active Problem List   Diagnosis Date Noted   Painful tongue 12/23/2022   Seronegative rheumatoid arthritis (HCC) 12/20/2022   Metabolic dysfunction-associated steatotic liver disease (MASLD) 12/20/2022   Hyperlipidemia 12/20/2022   ADHD 12/20/2022   Chronic right shoulder pain 11/02/2022   Elevated liver enzymes 11/02/2022   Prediabetes 11/02/2022   Essential hypertension, benign 11/02/2022   Breast cancer screening by mammogram 11/02/2022   Joint inflammation 06/10/2022   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) 05/05/2022   History of reverse total replacement of right shoulder joint 04/01/2022   Unilateral primary osteoarthritis, left knee 01/27/2022   Traumatic complete tear of right rotator cuff    S/P laparoscopic hysterectomy 04/14/2021   Grief reaction 03/23/2019   Gastroesophageal reflux disease without esophagitis 03/23/2019   Constipation 03/23/2019   Eczema 03/23/2019   Pain in joint, multiple sites 07/14/2018   Family history of arthritis 07/14/2018   Peeling skin 07/14/2018   Class 3  severe obesity due to excess calories with body mass index (BMI) of 40.0 to 44.9 in adult [facility]) 07/14/2018   Anxiety 07/14/2018   Depression 07/14/2018    PCP: Margaretann Loveless, MD  REFERRING PROVIDER: Holland Commons, MD  REFERRING DIAG: Presence of right artificial shoulder joint  THERAPY DIAG:  Right shoulder pain, unspecified chronicity  Stiffness of right shoulder, not elsewhere classified  Muscle weakness  (generalized)  Rationale for Evaluation and Treatment: Rehabilitation  ONSET DATE: 03/15/2023  SUBJECTIVE:                                                                                                                                                                                      PERTINENT HISTORY: Patient is a 52 y.o. female who presents to outpatient physical therapy with a referral for medical diagnosis s/p right shoulder hemiarthroplasty. This patient's chief complaints consist of pain, weakness, and decreased ROM, leading to difficulty using her RUE.  SUBJECTIVE STATEMENT: Patient states her right shoulder is bothering her so much she does not want to talk about it. She states she feels it is not right, but her doctor did not seem to think anything was wrong at her recent follow up. She states they took the staples out and it looks okay. She went to the doctor without her sling on.   PAIN: Are you having pain? Yes NPRS: 8-9/10 right shoulder "all the time" including when she moves her elbow.    PRECAUTIONS: Shoulder (see post-op protocol in chart), falls  WEIGHT BEARING RESTRICTIONS: Yes No WB activities RUE  PATIENT GOALS: Use the arm for something, wash your hair without bringing head to arm, not wear sports bra, put on bra  NEXT MD VISIT: around 05/18/2023  OBJECTIVE  Incision:    TODAY'S TREATMENT:    Therapeutic exercise: to centralize symptoms and improve ROM, strength, muscular endurance, and activity tolerance required for successful completion of functional activities.  - attempted recumbent bike for 2 minutes with no resistance. Discontinued due to left knee pain and wearing dress. Completed to encourage improved tissue healing and decrease pain.  - NuStep level 1 using bilateral upper and lower extremities. Seat/handle setting 9/9 (left side only). For improved extremity mobility, muscular endurance, and activity tolerance; and to induce the analgesic effect  of aerobic exercise, stimulate improved joint nutrition, and prepare body structures and systems for following interventions. x 5  minutes. Average SPM = 45. Bothered by left knee pain.  - supine <> sit with min A to get to/from supine position for manual therapy  Manual therapy: to reduce pain and tissue tension, improve range of motion, neuromodulation, in order to promote  improved ability to complete functional activities. SUPINE - R shoulder PROM flexion and scaption, 1x10-15 each direction with 10 second holds. Heavy guarding, especially when nearing 90 degrees. Required additional time for slow movement and breaks with education on relaxation and breathing techniques to decrease guarding.   - R shoulder ER to 30 degrees at 30 degrees scaption. 1x10 with minimal hold. Able to complete without significant guarding in this range.   Pt required multimodal cuing for proper technique and to facilitate improved neuromuscular control, strength, range of motion, and functional ability resulting in improved performance and form.  PATIENT EDUCATION: Education details: Exercise purpose/form. Self management techniques. Education on HEP including handout. Person educated: Patient Education method: Explanation, Demonstration, and Handouts Education comprehension: verbalized understanding, returned demonstration, verbal cues required, and needs further education  HOME EXERCISE PROGRAM: Access Code: [email or web address] URL: [email or web address] Date: 03/30/2023 Prepared by: Norton Blizzard  Exercises - Seated Scapular Retraction  - 3 x daily - 20 reps - 5 seconds hold - Circular Shoulder Pendulum with Table Support  - 3 x daily - 20 reps - Horizontal Shoulder Pendulum with Table Support  - 3 x daily - 20 reps - Flexion-Extension Shoulder Pendulum with Table Support  - 3 x daily - 20 reps - Standing Elbow Flexion Extension AROM  - 3 x daily - 20 reps - Seated Shoulder Flexion Towel Slide at Table  Top Full Range of Motion  - 3 x daily - 1 sets - 20 reps - 5 seconds hold - Seated Shoulder External Rotation AAROM with Dowel  - 3 x daily - 1 sets - 20 reps - 5 seconds hold  ASSESSMENT:  CLINICAL IMPRESSION: Patient arrives without sling today. She continues to express belief that her shoulder is not right and frustration with the constant pain and sickening "grinding" and shifting she feels. She had a hard time relaxing during PROM exercises performed by PT and is most limited by guarding. She expresses many beliefs and attitudes and demonstrates behaviors that are unhelpful for her recovery, such as avoiding looking at even pictures of her incision, reluctance to touch her shoulder, and repeated description of her fear of moving with focus on past unsuccessful experiences. She said she would not participate in any type of talk therapy, which would likely be helpful to help process her past experiences which is likely shaping her beliefs and attitudes and making recovery harder. She reported she continued to have pain by end of session but did not appear to have any worse pain. She did not provide a rating or quantification of pain when asked at the end of the visit. Her right shoulder is warm and slightly red and part of the incision has opened slightly (see picture in objective). No obvious signs of infection since the redness seems to be the same as the contralateral shoulder and the warmth could be within normal limits for her recent surgery. She said it looked similar and was warm when she went to her follow up appointment and her providers did not express any concern. Picture obtained today to help track progress and PT advised patient to immediately contact surgeon's office if any type of drainage appears. She reports she is showering after being told she could start that at her last follow up with the surgeon's office. Patient continues to have severe ROM, strength, and pain limitations in the  right shoulder .Patient would benefit from continued management of limiting condition by skilled physical therapist to address remaining impairments and functional  limitations to work towards stated goals and return to PLOF or maximal functional independence.    From Initial PT Evaluation on 03/28/2023:  Patient is a 52 y.o. female who was seen today for physical therapy evaluation and treatment s/p right shoulder hemiarthroplasty on 03/15/2023, which was her 3rd revision of a reverse TSA originally performed on 01/05/2022. Patient was unclear about which type of surgery she had and her exam was limited by her protocol/post-op precautions.  Upon assessment, her GH joint PROM was limited by heavy muscle guarding, pain, and apprehension with movement. This muscle guarding is likely exacerbated by a history of joint instability and she frequently flinched during PROM assessment.  The protocol from the surgeon states that the sling should be worn 24/7 except for tasks such as grooming, but patient reports taking off the sling when sitting on the couch for a few hours at a time since "it gets hot in there."  Patient received education on protocol guidelines for sling use.  Patient was shown how to do early rehab exercises such as pendulums, scap retractions, and elbow strengthening as allowable by her protocol. Patient would benefit from continued management of limiting condition by skilled physical therapist to address remaining impairments and functional limitations to work towards stated goals and return to PLOF or maximal functional independence.     OBJECTIVE IMPAIRMENTS: decreased activity tolerance, decreased balance, decreased coordination, decreased endurance, decreased knowledge of condition, decreased knowledge of use of DME, decreased mobility, difficulty walking, decreased ROM, decreased strength, hypomobility, increased edema, impaired perceived functional ability, increased muscle spasms, impaired  flexibility, impaired UE functional use, improper body mechanics, obesity, and pain.   ACTIVITY LIMITATIONS: carrying, lifting, sleeping, bed mobility, bathing, dressing, reach over head, hygiene/grooming, and caring for others  PARTICIPATION LIMITATIONS: meal prep, cleaning, laundry, interpersonal relationship, driving, shopping, community activity, and occupation  PERSONAL FACTORS: Behavior pattern, Fitness, Past/current experiences, Time since onset of injury/illness/exacerbation, and 3+ comorbidities: R.A., ADHD, Ankylosing spondylitis, Anxiety, Depression, GIRD, obesity, knee buckling, HTN, pre-diabetes  are also affecting patient's functional outcome.   REHAB POTENTIAL: Fair This is her 3rd revision after 3 unsuccessful surgical attempts  CLINICAL DECISION MAKING: Evolving/moderate complexity  EVALUATION COMPLEXITY: Moderate   GOALS: Goals reviewed with patient? No  SHORT TERM GOALS: Target date: 04/11/2023  Patient will be independent with initial home exercise program for self-management of symptoms. Baseline: Initial HEP provided at IE (03/28/23); Goal status: INITIAL   LONG TERM GOALS: Target date: 06/20/2023  Patient will be independent with a long-term home exercise program for self-management of symptoms.  Baseline: Initial HEP provided at IE (03/28/23); Goal status: INITIAL  2.  Patient will demonstrate improved FOTO by equal or greater than 10 points by visit #15 to demonstrate improvement in overall condition and self-reported functional ability.  Baseline: Will assess at visit 2 (03/28/23); 36 at visit #2 (03/30/2023);  Goal status: INITIAL  3.  Patient will achieve 100 degrees of active right shoulder elevation to allow her to perform ADLs with decreased limitation. Baseline: elevation PROM at 90 degrees (03/28/23); Goal status: INITIAL  4.  Patient will demonstrate 4/5 strength during MMT assessment in right shoulder elevation, extension, IR, and periscapular  musculature to perform ADLs with decreased limitation. Baseline: Unable to perform MMT due to protocol (03/28/23); Goal status: INITIAL  5.  Patient will demonstrate improvement in Patient Specific Functional Scale (PSFS) of equal or greater than 3 points to reflect clinically significant improvement in patient's most valued functional activities. Baseline: Will assess at  visit 2 (03/28/23); 3 at visit #2 (03/30/2023);  Goal status: In-progress  PLAN:  PT FREQUENCY: 1-2x/week  PT DURATION: 12 weeks  PLANNED INTERVENTIONS: 97164- PT Re-evaluation, 97110-Therapeutic exercises, 97530- Therapeutic activity, 97112- Neuromuscular re-education, 97535- Self Care, 16109- Manual therapy, [phone]- Aquatic Therapy, 97014- Electrical stimulation (unattended), Patient/Family education, Balance training, Dry Needling, Joint mobilization, Joint manipulation, Scar mobilization, DME instructions, Cryotherapy, and Moist heat  PLAN FOR NEXT SESSION: Monitor incision, Continue PROM and strengthening above and below within protocol.   Luretha Murphy. Ilsa Iha, PT, DPT 04/11/23, 1:40 PM  [facility] [address] [place], [place] [address] P: [phone] I F: [phone]

## 2023-04-13 ENCOUNTER — Ambulatory Visit: Payer: 59 | Admitting: Physical Therapy

## 2023-04-13 ENCOUNTER — Telehealth: Payer: Self-pay | Admitting: Student

## 2023-04-13 NOTE — Therapy (Unsigned)
OUTPATIENT PHYSICAL THERAPY TREATMENT   Patient Name: Anna Keller MRN: [id number] DOB:May 24, 1970, 52 y.o., female Today's Date: 04/13/2023  END OF SESSION:     Past Medical History:  Diagnosis Date   ADHD (attention deficit hyperactivity disorder)    Ankylosing spondylitis (HCC)    Anxiety    Arthritis    Depression    Eczema    Family history of adverse reaction to anesthesia    dad would be crazy, talk out of his head-per pt   Headache    Rheumatoid arthritis (HCC)    Past Surgical History:  Procedure Laterality Date   BLADDER SUSPENSION N/A 04/14/2021   Procedure: TRANSVAGINAL TAPE (TVT) PROCEDURE;  Surgeon: Essie Hart, MD;  Location: [facility];  Service: Gynecology;  Laterality: N/A;  GYNCARE TVT EXACT   CYSTOSCOPY N/A 04/14/2021   Procedure: CYSTOSCOPY;  Surgeon: Essie Hart, MD;  Location: [facility];  Service: Gynecology;  Laterality: N/A;   KNEE ARTHROSCOPY Left 1988   REVERSE SHOULDER ARTHROPLASTY Right 01/05/2022   Procedure: RIGHT SHOULDER REVERSE SHOULDER ARTHROPLASTY;  Surgeon: Huel Cote, MD;  Location: [facility];  Service: Orthopedics;  Laterality: Right;   REVERSE SHOULDER ARTHROPLASTY Right 04/01/2022   Procedure: RIGHT REVERSE SHOULDER ARTHROPLASTY REVISION;  Surgeon: Huel Cote, MD;  [facility];  Service: Orthopedics;  Laterality: Right;   REVERSE SHOULDER ARTHROPLASTY Right 07/22/2022   Procedure: RIGHT SHOULDER REVISION  ARTHROPLASTY;  Surgeon: Huel Cote, MD;  [facility];  Service: Orthopedics;  Laterality: Right;   TONSILLECTOMY     as a child   TOTAL LAPAROSCOPIC HYSTERECTOMY WITH SALPINGECTOMY Bilateral 04/14/2021   Procedure: TOTAL LAPAROSCOPIC HYSTERECTOMY WITH BILATERAL SALPINGECTOMY AND BILATERAL OOPHERECTOMY;  Surgeon: Essie Hart, MD;  [facility];  Service: Gynecology;  Laterality: Bilateral;   Patient Active Problem List   Diagnosis Date Noted   Painful tongue 12/23/2022   Seronegative rheumatoid arthritis (HCC)  12/20/2022   Metabolic dysfunction-associated steatotic liver disease (MASLD) 12/20/2022   Hyperlipidemia 12/20/2022   ADHD 12/20/2022   Chronic right shoulder pain 11/02/2022   Elevated liver enzymes 11/02/2022   Prediabetes 11/02/2022   Essential hypertension, benign 11/02/2022   Breast cancer screening by mammogram 11/02/2022   Joint inflammation 06/10/2022   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) 05/05/2022   History of reverse total replacement of right shoulder joint 04/01/2022   Unilateral primary osteoarthritis, left knee 01/27/2022   Traumatic complete tear of right rotator cuff    S/P laparoscopic hysterectomy 04/14/2021   Grief reaction 03/23/2019   Gastroesophageal reflux disease without esophagitis 03/23/2019   Constipation 03/23/2019   Eczema 03/23/2019   Pain in joint, multiple sites 07/14/2018   Family history of arthritis 07/14/2018   Peeling skin 07/14/2018   Class 3 severe obesity due to excess calories with body mass index (BMI) of 40.0 to 44.9 in adult [facility]) 07/14/2018   Anxiety 07/14/2018   Depression 07/14/2018    PCP: Margaretann Loveless, MD  REFERRING PROVIDER: Holland Commons, MD  REFERRING DIAG: Presence of right artificial shoulder joint  THERAPY DIAG:  No diagnosis found.  Rationale for Evaluation and Treatment: Rehabilitation  ONSET DATE: 03/15/2023  SUBJECTIVE:  PERTINENT HISTORY: Patient is a 52 y.o. female who presents to outpatient physical therapy with a referral for medical diagnosis s/p right shoulder hemiarthroplasty. This patient's chief complaints consist of pain, weakness, and decreased ROM, leading to difficulty using her RUE.  SUBJECTIVE STATEMENT: Patient states her right shoulder is bothering her so much she does not want to  talk about it. She states she feels it is not right, but her doctor did not seem to think anything was wrong at her recent follow up. She states they took the staples out and it looks okay. She went to the doctor without her sling on.   PAIN: Are you having pain? Yes NPRS: 8-9/10 right shoulder "all the time" including when she moves her elbow.    PRECAUTIONS: Shoulder (see post-op protocol in chart), falls  WEIGHT BEARING RESTRICTIONS: Yes No WB activities RUE  PATIENT GOALS: Use the arm for something, wash your hair without bringing head to arm, not wear sports bra, put on bra  NEXT MD VISIT: around 05/18/2023  OBJECTIVE  Incision:    TODAY'S TREATMENT:    Therapeutic exercise: to centralize symptoms and improve ROM, strength, muscular endurance, and activity tolerance required for successful completion of functional activities.   - attempted recumbent bike for 2 minutes with no resistance. Discontinued due to left knee pain and wearing dress. Completed to encourage improved tissue healing and decrease pain.   - NuStep level 1 using bilateral upper and lower extremities. Seat/handle setting 9/9 (left side only). For improved extremity mobility, muscular endurance, and activity tolerance; and to induce the analgesic effect of aerobic exercise, stimulate improved joint nutrition, and prepare body structures and systems for following interventions. x 5  minutes. Average SPM = 45. Bothered by left knee pain.   - supine <> sit with min A to get to/from supine position for manual therapy  Manual therapy: to reduce pain and tissue tension, improve range of motion, neuromodulation, in order to promote improved ability to complete functional activities.  SUPINE - R shoulder PROM flexion and scaption, 1x10-15 each direction with 10 second holds. Heavy guarding, especially when nearing 90 degrees. Required additional time for slow movement and breaks with education on relaxation and breathing  techniques to decrease guarding.   - R shoulder ER to 30 degrees at 30 degrees scaption. 1x10 with minimal hold. Able to complete without significant guarding in this range.   Pt required multimodal cuing for proper technique and to facilitate improved neuromuscular control, strength, range of motion, and functional ability resulting in improved performance and form.  PATIENT EDUCATION: Education details: Exercise purpose/form. Self management techniques. Education on HEP including handout. Person educated: Patient Education method: Explanation, Demonstration, and Handouts Education comprehension: verbalized understanding, returned demonstration, verbal cues required, and needs further education  HOME EXERCISE PROGRAM: Access Code: [email or web address] URL: [email or web address] Date: 03/30/2023 Prepared by: Norton Blizzard  Exercises - Seated Scapular Retraction  - 3 x daily - 20 reps - 5 seconds hold - Circular Shoulder Pendulum with Table Support  - 3 x daily - 20 reps - Horizontal Shoulder Pendulum with Table Support  - 3 x daily - 20 reps - Flexion-Extension Shoulder Pendulum with Table Support  - 3 x daily - 20 reps - Standing Elbow Flexion Extension AROM  - 3 x daily - 20 reps - Seated Shoulder Flexion Towel Slide at Table Top Full Range of Motion  - 3 x daily - 1 sets - 20 reps - 5 seconds hold - Seated Shoulder External  Rotation AAROM with Dowel  - 3 x daily - 1 sets - 20 reps - 5 seconds hold  ASSESSMENT:  CLINICAL IMPRESSION: Patient arrives without sling today. She continues to express belief that her shoulder is not right and frustration with the constant pain and sickening "grinding" and shifting she feels. She had a hard time relaxing during PROM exercises performed by PT and is most limited by guarding. She expresses many beliefs and attitudes and demonstrates behaviors that are unhelpful for her recovery, such as avoiding looking at even pictures of her incision,  reluctance to touch her shoulder, and repeated description of her fear of moving with focus on past unsuccessful experiences. She said she would not participate in any type of talk therapy, which would likely be helpful to help process her past experiences which is likely shaping her beliefs and attitudes and making recovery harder. She reported she continued to have pain by end of session but did not appear to have any worse pain. She did not provide a rating or quantification of pain when asked at the end of the visit. Her right shoulder is warm and slightly red and part of the incision has opened slightly (see picture in objective). No obvious signs of infection since the redness seems to be the same as the contralateral shoulder and the warmth could be within normal limits for her recent surgery. She said it looked similar and was warm when she went to her follow up appointment and her providers did not express any concern. Picture obtained today to help track progress and PT advised patient to immediately contact surgeon's office if any type of drainage appears. She reports she is showering after being told she could start that at her last follow up with the surgeon's office. Patient continues to have severe ROM, strength, and pain limitations in the right shoulder .Patient would benefit from continued management of limiting condition by skilled physical therapist to address remaining impairments and functional limitations to work towards stated goals and return to PLOF or maximal functional independence.    From Initial PT Evaluation on 03/28/2023:  Patient is a 52 y.o. female who was seen today for physical therapy evaluation and treatment s/p right shoulder hemiarthroplasty on 03/15/2023, which was her 3rd revision of a reverse TSA originally performed on 01/05/2022. Patient was unclear about which type of surgery she had and her exam was limited by her protocol/post-op precautions.  Upon assessment, her  GH joint PROM was limited by heavy muscle guarding, pain, and apprehension with movement. This muscle guarding is likely exacerbated by a history of joint instability and she frequently flinched during PROM assessment.  The protocol from the surgeon states that the sling should be worn 24/7 except for tasks such as grooming, but patient reports taking off the sling when sitting on the couch for a few hours at a time since "it gets hot in there."  Patient received education on protocol guidelines for sling use.  Patient was shown how to do early rehab exercises such as pendulums, scap retractions, and elbow strengthening as allowable by her protocol. Patient would benefit from continued management of limiting condition by skilled physical therapist to address remaining impairments and functional limitations to work towards stated goals and return to PLOF or maximal functional independence.     OBJECTIVE IMPAIRMENTS: decreased activity tolerance, decreased balance, decreased coordination, decreased endurance, decreased knowledge of condition, decreased knowledge of use of DME, decreased mobility, difficulty walking, decreased ROM, decreased strength, hypomobility, increased  edema, impaired perceived functional ability, increased muscle spasms, impaired flexibility, impaired UE functional use, improper body mechanics, obesity, and pain.   ACTIVITY LIMITATIONS: carrying, lifting, sleeping, bed mobility, bathing, dressing, reach over head, hygiene/grooming, and caring for others  PARTICIPATION LIMITATIONS: meal prep, cleaning, laundry, interpersonal relationship, driving, shopping, community activity, and occupation  PERSONAL FACTORS: Behavior pattern, Fitness, Past/current experiences, Time since onset of injury/illness/exacerbation, and 3+ comorbidities: R.A., ADHD, Ankylosing spondylitis, Anxiety, Depression, GIRD, obesity, knee buckling, HTN, pre-diabetes  are also affecting patient's functional outcome.    REHAB POTENTIAL: Fair This is her 3rd revision after 3 unsuccessful surgical attempts  CLINICAL DECISION MAKING: Evolving/moderate complexity  EVALUATION COMPLEXITY: Moderate   GOALS: Goals reviewed with patient? No  SHORT TERM GOALS: Target date: 04/11/2023  Patient will be independent with initial home exercise program for self-management of symptoms. Baseline: Initial HEP provided at IE (03/28/23); Goal status: INITIAL   LONG TERM GOALS: Target date: 06/20/2023  Patient will be independent with a long-term home exercise program for self-management of symptoms.  Baseline: Initial HEP provided at IE (03/28/23); Goal status: INITIAL  2.  Patient will demonstrate improved FOTO by equal or greater than 10 points by visit #15 to demonstrate improvement in overall condition and self-reported functional ability.  Baseline: Will assess at visit 2 (03/28/23); 36 at visit #2 (03/30/2023);  Goal status: INITIAL  3.  Patient will achieve 100 degrees of active right shoulder elevation to allow her to perform ADLs with decreased limitation. Baseline: elevation PROM at 90 degrees (03/28/23); Goal status: INITIAL  4.  Patient will demonstrate 4/5 strength during MMT assessment in right shoulder elevation, extension, IR, and periscapular musculature to perform ADLs with decreased limitation. Baseline: Unable to perform MMT due to protocol (03/28/23); Goal status: INITIAL  5.  Patient will demonstrate improvement in Patient Specific Functional Scale (PSFS) of equal or greater than 3 points to reflect clinically significant improvement in patient's most valued functional activities. Baseline: Will assess at visit 2 (03/28/23); 3 at visit #2 (03/30/2023);  Goal status: In-progress  PLAN:  PT FREQUENCY: 1-2x/week  PT DURATION: 12 weeks  PLANNED INTERVENTIONS: 97164- PT Re-evaluation, 97110-Therapeutic exercises, 97530- Therapeutic activity, 97112- Neuromuscular re-education, 97535- Self  Care, 56213- Manual therapy, [phone]- Aquatic Therapy, 97014- Electrical stimulation (unattended), Patient/Family education, Balance training, Dry Needling, Joint mobilization, Joint manipulation, Scar mobilization, DME instructions, Cryotherapy, and Moist heat  PLAN FOR NEXT SESSION: Monitor incision, Continue PROM and strengthening above and below within protocol.   Luretha Murphy. Ilsa Iha, PT, DPT 04/13/23, 9:51 AM  [facility] [address] [place], [place] [address] P: [phone] I F: [phone]

## 2023-04-13 NOTE — Telephone Encounter (Signed)
Called patient due to no-show. Left message and told her to call back to reschedule and let her know there may be availability later in the day

## 2023-04-18 ENCOUNTER — Encounter: Payer: Self-pay | Admitting: Cardiology

## 2023-04-18 DIAGNOSIS — F900 Attention-deficit hyperactivity disorder, predominantly inattentive type: Secondary | ICD-10-CM

## 2023-04-19 ENCOUNTER — Ambulatory Visit: Payer: 59 | Admitting: Physician Assistant

## 2023-04-19 ENCOUNTER — Encounter: Payer: Self-pay | Admitting: Physician Assistant

## 2023-04-19 ENCOUNTER — Encounter: Payer: Self-pay | Admitting: Internal Medicine

## 2023-04-19 ENCOUNTER — Ambulatory Visit: Payer: 59 | Admitting: Physical Therapy

## 2023-04-19 VITALS — Ht 66.0 in | Wt 285.0 lb

## 2023-04-19 DIAGNOSIS — N3281 Overactive bladder: Secondary | ICD-10-CM | POA: Diagnosis not present

## 2023-04-19 DIAGNOSIS — F900 Attention-deficit hyperactivity disorder, predominantly inattentive type: Secondary | ICD-10-CM

## 2023-04-19 MED ORDER — AMPHETAMINE-DEXTROAMPHETAMINE 20 MG PO TABS: 20.0000 mg | ORAL_TABLET | Freq: Two times a day (BID) | ORAL | 0 refills | Status: DC

## 2023-04-19 NOTE — Progress Notes (Signed)
04/19/2023 3:16 PM   Anna Keller 01/16/1971 [id number]  CC: Chief Complaint  Patient presents with   Follow-up    HPI: Anna Keller is a 52 y.o. female with PMH cystocele s/p bladder suspension in 2022 and OAB on Solifenacin 10 mg who previously failed oxybutynin XL, Gemtesa, and Myrbetriq who presents today for follow-up.   Today she reports she is not sure the Solifenacin is helping her.  She is primarily bothered by urge incontinence, which is worsened overnight.  Her stress incontinence has resolved.  She admits to some daytime hesitancy.  She denies dysuria or gross hematuria.  She has dry mouth but takes multiple medications and cannot attribute it directly to Solifenacin.  She has a history of IBS-C that is well-managed on daily MiraLAX.  PMH: Past Medical History:  Diagnosis Date   ADHD (attention deficit hyperactivity disorder)    Ankylosing spondylitis (HCC)    Anxiety    Arthritis    Depression    Eczema    Family history of adverse reaction to anesthesia    dad would be crazy, talk out of his head-per pt   Headache    Rheumatoid arthritis (HCC)     Surgical History: Past Surgical History:  Procedure Laterality Date   BLADDER SUSPENSION N/A 04/14/2021   Procedure: TRANSVAGINAL TAPE (TVT) PROCEDURE;  Surgeon: Essie Hart, MD;  [facility];  Service: Gynecology;  Laterality: N/A;  GYNCARE TVT EXACT   CYSTOSCOPY N/A 04/14/2021   Procedure: CYSTOSCOPY;  Surgeon: Essie Hart, MD;  [facility];  Service: Gynecology;  Laterality: N/A;   KNEE ARTHROSCOPY Left 1988   REVERSE SHOULDER ARTHROPLASTY Right 01/05/2022   Procedure: RIGHT SHOULDER REVERSE SHOULDER ARTHROPLASTY;  Surgeon: Huel Cote, MD;  [facility];  Service: Orthopedics;  Laterality: Right;   REVERSE SHOULDER ARTHROPLASTY Right 04/01/2022   Procedure: RIGHT REVERSE SHOULDER ARTHROPLASTY REVISION;  Surgeon: Huel Cote, MD;  [facility];  Service: Orthopedics;   Laterality: Right;   REVERSE SHOULDER ARTHROPLASTY Right 07/22/2022   Procedure: RIGHT SHOULDER REVISION  ARTHROPLASTY;  Surgeon: Huel Cote, MD;  [facility];  Service: Orthopedics;  Laterality: Right;   TONSILLECTOMY     as a child   TOTAL LAPAROSCOPIC HYSTERECTOMY WITH SALPINGECTOMY Bilateral 04/14/2021   Procedure: TOTAL LAPAROSCOPIC HYSTERECTOMY WITH BILATERAL SALPINGECTOMY AND BILATERAL OOPHERECTOMY;  Surgeon: Essie Hart, MD;  [facility]: [facility];  Service: Gynecology;  Laterality: Bilateral;    Home Medications:  Allergies as of 04/19/2023       Reactions   Codeine Nausea And Vomiting        Medication List        Accurate as of April 19, 2023  3:16 PM. If you have any questions, ask your nurse or doctor.          ALPRAZolam 1 MG tablet Commonly known as: XANAX Take 1 tablet (1 mg total) by mouth 3 (three) times daily as needed for anxiety.   amLODipine 5 MG tablet Commonly known as: NORVASC Take 1 tablet (5 mg total) by mouth daily.   amphetamine-dextroamphetamine 20 MG tablet Commonly known as: ADDERALL Take 1 tablet (20 mg total) by mouth 2 (two) times daily.   clobetasol ointment 0.05 % Commonly known as: TEMOVATE Apply topically 2 (two) times daily.   diclofenac 50 MG EC tablet Commonly known as: VOLTAREN TAKE 1 TABLET BY MOUTH TWICE DAILY WITH FOOD AND LARGE GLASS OF WATER WITH EACH DOSE(STOP CELECOXIB)   doxepin 25 MG capsule  Commonly known as: SINEQUAN TAKE 1 CAPSULE BY MOUTH EVERYDAY AT BEDTIME   FLUoxetine 40 MG capsule Commonly known as: PROZAC Take 40 mg by mouth 2 (two) times daily.   gabapentin 600 MG tablet Commonly known as: NEURONTIN Take 600 mg by mouth 2 (two) times daily.   levocetirizine 5 MG tablet Commonly known as: XYZAL Take 5 mg by mouth every evening.   multivitamin with minerals tablet Take 1 tablet by mouth daily.   omeprazole 20 MG capsule Commonly known as: PRILOSEC Take 1 capsule by mouth once daily    ORENCIA IV Inject 1 Units into the vein every 30 (thirty) days.   polyethylene glycol powder 17 GM/SCOOP powder Commonly known as: GLYCOLAX/MIRALAX Take 17 g by mouth daily. What changed:  when to take this reasons to take this   rosuvastatin 40 MG tablet Commonly known as: CRESTOR TAKE 1 TABLET BY MOUTH ONCE EVERY EVENING   solifenacin 10 MG tablet Commonly known as: VESICARE Take 1 tablet (10 mg total) by mouth daily.   traMADol 50 MG tablet Commonly known as: ULTRAM TAKE 2 TABLETS (100 MG TOTAL) BY MOUTH EVERY 12 (TWELVE) HOURS AS NEEDED.   Trulicity 0.75 MG/0.5ML Soaj Generic drug: Dulaglutide Inject 0.75 mg into the skin once a week.   venlafaxine XR 150 MG 24 hr capsule Commonly known as: EFFEXOR-XR Take 1 capsule (150 mg total) by mouth 2 (two) times daily.        Allergies:  Allergies  Allergen Reactions   Codeine Nausea And Vomiting    Family History: Family History  Problem Relation Age of Onset   Hypertension Mother    Arthritis Father    Heart disease Sister    Pulmonary Hypertension Sister    COPD Sister    Diabetes Sister    Breast cancer Maternal Grandmother    Heart attack Paternal Grandmother    Breast cancer Maternal Aunt    Breast cancer Cousin     Social History:   reports that she quit smoking about 7 years ago. Her smoking use included cigarettes. She started smoking about 32 years ago. She has a 25 pack-year smoking history. She has never used smokeless tobacco. She reports that she does not currently use alcohol. She reports that she does not use drugs.  Physical Exam: Ht 5\' 6"  (1.676 m)   Wt 285 lb (129.3 kg)   LMP 03/24/2021   BMI 46.00 kg/m   Constitutional:  Alert and oriented, no acute distress, nontoxic appearing HEENT: [place], AT Cardiovascular: No clubbing, cyanosis, or edema Respiratory: Normal respiratory effort, no increased work of breathing Skin: No rashes, bruises or suspicious lesions Neurologic: Grossly intact,  no focal deficits, moving all 4 extremities Psychiatric: Normal mood and affect  Assessment & Plan:   1. Overactive bladder (Primary) Unclear benefit on Solifenacin.  She is having some dry mouth, unclear if related.  We discussed augmenting with third line therapies including PTNS, intravesical Botox, and InterStim.  She is adamantly opposed to the idea of self-catheterization so is unwilling to try Botox.  She is hesitant to pursue additional surgery given multiple recent shoulder replacements.  She would like to try PTNS.  Will keep her on Solifenacin as well.  Return for Will call to schedule PTNS.  Carman Ching, PA-C  [facility] [place] [address], [address] [place], [place] [address] [phone]

## 2023-04-23 ENCOUNTER — Encounter: Payer: Self-pay | Admitting: Cardiology

## 2023-04-25 MED ORDER — CLOBETASOL PROPIONATE 0.05 % EX OINT: TOPICAL_OINTMENT | Freq: Two times a day (BID) | CUTANEOUS | 1 refills | Status: DC

## 2023-04-30 ENCOUNTER — Other Ambulatory Visit: Payer: Self-pay | Admitting: Internal Medicine

## 2023-04-30 ENCOUNTER — Other Ambulatory Visit: Payer: Self-pay | Admitting: Urology

## 2023-04-30 DIAGNOSIS — G8929 Other chronic pain: Secondary | ICD-10-CM

## 2023-05-03 ENCOUNTER — Ambulatory Visit: Payer: 59 | Admitting: Physical Therapy

## 2023-05-09 ENCOUNTER — Telehealth: Payer: Self-pay

## 2023-05-09 ENCOUNTER — Ambulatory Visit: Payer: 59 | Admitting: Physical Therapy

## 2023-05-09 NOTE — Telephone Encounter (Signed)
-----   Message from [facility] sent at 04/19/2023  6:06 PM EST ----- Regarding: PTNS Can you please call her to schedule?  I told her it probably wouldn't be until January.

## 2023-05-10 NOTE — Telephone Encounter (Signed)
 [employer] and was told no PA is needed but patient has deductible of [phone] that has to be made before anything starts to be covered. I called patient and she states this insurance should no longer be active as the patient has this insurance when she was with her husband in 2024 and she is not with him anymore. She does not have income and is pending coverage for medicaid and disability, advised patient medicaid will not cover this service. Patient states she may be able to get insurance again later on this year and will update us  at that time if she would like to get checked if PTNS would be covered at that time.

## 2023-05-11 ENCOUNTER — Ambulatory Visit: Payer: 59 | Admitting: Physical Therapy

## 2023-05-16 ENCOUNTER — Other Ambulatory Visit: Payer: Self-pay

## 2023-05-16 ENCOUNTER — Encounter: Payer: Self-pay | Admitting: Cardiology

## 2023-05-16 DIAGNOSIS — F900 Attention-deficit hyperactivity disorder, predominantly inattentive type: Secondary | ICD-10-CM

## 2023-05-17 ENCOUNTER — Encounter: Payer: 59 | Admitting: Physical Therapy

## 2023-05-18 ENCOUNTER — Encounter: Payer: Self-pay | Admitting: Urology

## 2023-05-18 MED ORDER — AMPHETAMINE-DEXTROAMPHETAMINE 20 MG PO TABS: 20.0000 mg | ORAL_TABLET | Freq: Two times a day (BID) | ORAL | 0 refills | Status: DC

## 2023-05-19 ENCOUNTER — Other Ambulatory Visit: Payer: Self-pay | Admitting: *Deleted

## 2023-05-19 ENCOUNTER — Encounter: Payer: 59 | Admitting: Physical Therapy

## 2023-05-19 MED ORDER — SOLIFENACIN SUCCINATE 10 MG PO TABS: 10.0000 mg | ORAL_TABLET | Freq: Every day | ORAL | 3 refills | Status: DC

## 2023-05-24 ENCOUNTER — Encounter: Payer: 59 | Admitting: Physical Therapy

## 2023-05-26 ENCOUNTER — Encounter: Payer: 59 | Admitting: Physical Therapy

## 2023-05-29 ENCOUNTER — Other Ambulatory Visit: Payer: Self-pay | Admitting: Internal Medicine

## 2023-05-29 DIAGNOSIS — G4700 Insomnia, unspecified: Secondary | ICD-10-CM

## 2023-05-31 ENCOUNTER — Encounter: Payer: 59 | Admitting: Physical Therapy

## 2023-06-02 ENCOUNTER — Encounter: Payer: 59 | Admitting: Physical Therapy

## 2023-06-07 ENCOUNTER — Encounter: Payer: 59 | Admitting: Physical Therapy

## 2023-06-09 ENCOUNTER — Encounter: Payer: 59 | Admitting: Physical Therapy

## 2023-06-15 ENCOUNTER — Encounter: Payer: Self-pay | Admitting: Cardiology

## 2023-06-15 ENCOUNTER — Encounter (HOSPITAL_BASED_OUTPATIENT_CLINIC_OR_DEPARTMENT_OTHER): Payer: Self-pay | Admitting: Orthopaedic Surgery

## 2023-06-15 DIAGNOSIS — F900 Attention-deficit hyperactivity disorder, predominantly inattentive type: Secondary | ICD-10-CM

## 2023-06-16 MED ORDER — AMPHETAMINE-DEXTROAMPHETAMINE 20 MG PO TABS: 20.0000 mg | ORAL_TABLET | Freq: Two times a day (BID) | ORAL | 0 refills | Status: DC

## 2023-06-17 ENCOUNTER — Ambulatory Visit: Payer: Medicaid Other | Admitting: Cardiology

## 2023-06-17 ENCOUNTER — Encounter: Payer: Self-pay | Admitting: Cardiology

## 2023-06-17 VITALS — BP 124/68 | HR 96 | Ht 66.0 in | Wt 285.0 lb

## 2023-06-17 DIAGNOSIS — R7303 Prediabetes: Secondary | ICD-10-CM

## 2023-06-17 DIAGNOSIS — Z0130 Encounter for examination of blood pressure without abnormal findings: Secondary | ICD-10-CM

## 2023-06-17 MED ORDER — SEMAGLUTIDE(0.25 OR 0.5MG/DOS) 2 MG/3ML ~~LOC~~ SOPN: 0.2500 mg | PEN_INJECTOR | SUBCUTANEOUS | 3 refills | Status: DC

## 2023-06-17 NOTE — Progress Notes (Signed)
 Established Patient Office Visit  Subjective:  Patient ID: Anna Keller, female    DOB: 02/06/1971  Age: 53 y.o. MRN: [id number]  Chief Complaint  Patient presents with   Medication Refill    Discuss Diabetic Medication    Patient in office to discuss diabetic medication. Patient on new insurance, will no longer pay for her Trulicity. Will change to Ozempic. Patient reports having 2-3 injections remaining. Will follow up 3-4 weeks after starting Ozempic.     No other concerns at this time.   Past Medical History:  Diagnosis Date   ADHD (attention deficit hyperactivity disorder)    Ankylosing spondylitis (HCC)    Anxiety    Arthritis    Depression    Eczema    Family history of adverse reaction to anesthesia    dad would be crazy, talk out of his head-per pt   Headache    Rheumatoid arthritis (HCC)     Past Surgical History:  Procedure Laterality Date   BLADDER SUSPENSION N/A 04/14/2021   Procedure: TRANSVAGINAL TAPE (TVT) PROCEDURE;  Surgeon: Essie Hart, MD;  Location: [facility];  Service: Gynecology;  Laterality: N/A;  GYNCARE TVT EXACT   CYSTOSCOPY N/A 04/14/2021   Procedure: CYSTOSCOPY;  Surgeon: Essie Hart, MD;  Location: [facility];  Service: Gynecology;  Laterality: N/A;   KNEE ARTHROSCOPY Left 1988   REVERSE SHOULDER ARTHROPLASTY Right 01/05/2022   Procedure: RIGHT SHOULDER REVERSE SHOULDER ARTHROPLASTY;  Surgeon: Huel Cote, MD;  Location: [facility];  Service: Orthopedics;  Laterality: Right;   REVERSE SHOULDER ARTHROPLASTY Right 04/01/2022   Procedure: RIGHT REVERSE SHOULDER ARTHROPLASTY REVISION;  Surgeon: Huel Cote, MD;  Location: [facility];  Service: Orthopedics;  Laterality: Right;   REVERSE SHOULDER ARTHROPLASTY Right 07/22/2022   Procedure: RIGHT SHOULDER REVISION  ARTHROPLASTY;  Surgeon: Huel Cote, MD;  Location: [facility];  Service: Orthopedics;  Laterality: Right;   TONSILLECTOMY     as a child   TOTAL LAPAROSCOPIC HYSTERECTOMY WITH SALPINGECTOMY  Bilateral 04/14/2021   Procedure: TOTAL LAPAROSCOPIC HYSTERECTOMY WITH BILATERAL SALPINGECTOMY AND BILATERAL OOPHERECTOMY;  Surgeon: Essie Hart, MD;  Location: [facility];  Service: Gynecology;  Laterality: Bilateral;    Social History   Socioeconomic History   Marital status: Married    Spouse name: Casimiro Needle   Number of children: 1   Years of education: 12   Highest education level: High school graduate  Occupational History   Not on file  Tobacco Use   Smoking status: Former    Current packs/day: 0.00    Average packs/day: 1 pack/day for 25.0 years (25.0 ttl pk-yrs)    Types: Cigarettes    Start date: 01/09/1991    Quit date: 01/09/2016    Years since quitting: 7.4   Smokeless tobacco: Never  Vaping Use   Vaping status: Never Used  Substance and Sexual Activity   Alcohol use: Not Currently   Drug use: No   Sexual activity: Yes    Partners: Male    Birth control/protection: None  Other Topics Concern   Not on file  Social History Narrative   Not on file   Social Drivers of Health   Financial Resource Strain: Low Risk  (07/14/2018)   Overall Financial Resource Strain (CARDIA)    Difficulty of Paying Living Expenses: Not hard at all  Food Insecurity: No Food Insecurity (07/14/2018)   Hunger Vital Sign    Worried About Running Out of Food in the Last Year: Never true    Ran Out of  Food in the Last Year: Never true  Transportation Needs: No Transportation Needs (04/02/2022)   PRAPARE - Administrator, Civil Service (Medical): No    Lack of Transportation (Non-Medical): No  Physical Activity: Inactive (07/14/2018)   Exercise Vital Sign    Days of Exercise per Week: 0 days    Minutes of Exercise per Session: 0 min  Stress: No Stress Concern Present (07/14/2018)   [employer] of Occupational Health - Occupational Stress Questionnaire    Feeling of Stress : Only a little  Social Connections: Somewhat Isolated (07/14/2018)   Social Connection and Isolation Panel  [NHANES]    Frequency of Communication with Friends and Family: More than three times a week    Frequency of Social Gatherings with Friends and Family: More than three times a week    Attends Religious Services: Never    Database administrator or Organizations: No    Attends Banker Meetings: Never    Marital Status: Married  Catering manager Violence: Not At Risk (04/02/2022)   Humiliation, Afraid, Rape, and Kick questionnaire    Fear of Current or Ex-Partner: No    Emotionally Abused: No    Physically Abused: No    Sexually Abused: No    Family History  Problem Relation Age of Onset   Hypertension Mother    Arthritis Father    Heart disease Sister    Pulmonary Hypertension Sister    COPD Sister    Diabetes Sister    Breast cancer Maternal Grandmother    Heart attack Paternal Grandmother    Breast cancer Maternal Aunt    Breast cancer Cousin     Allergies  Allergen Reactions   Codeine Nausea And Vomiting    Outpatient Medications Prior to Visit  Medication Sig   Abatacept (ORENCIA IV) Inject 1 Units into the vein every 30 (thirty) days.   ALPRAZolam (XANAX) 1 MG tablet Take 1 tablet (1 mg total) by mouth 3 (three) times daily as needed for anxiety.   amLODipine (NORVASC) 5 MG tablet Take 1 tablet (5 mg total) by mouth daily.   amphetamine-dextroamphetamine (ADDERALL) 20 MG tablet Take 1 tablet (20 mg total) by mouth 2 (two) times daily.   clobetasol ointment (TEMOVATE) 0.05 % Apply topically 2 (two) times daily.   diclofenac (VOLTAREN) 50 MG EC tablet TAKE 1 TABLET BY MOUTH TWICE DAILY WITH FOOD AND LARGE GLASS OF WATER WITH EACH DOSE(STOP CELECOXIB)   doxepin (SINEQUAN) 25 MG capsule TAKE 1 CAPSULE BY MOUTH EVERYDAY AT BEDTIME   FLUoxetine (PROZAC) 40 MG capsule Take 40 mg by mouth 2 (two) times daily.   gabapentin (NEURONTIN) 600 MG tablet Take 600 mg by mouth 2 (two) times daily.   levocetirizine (XYZAL) 5 MG tablet Take 5 mg by mouth every evening.    Multiple Vitamins-Minerals (MULTIVITAMIN WITH MINERALS) tablet Take 1 tablet by mouth daily.   omeprazole (PRILOSEC) 20 MG capsule Take 1 capsule by mouth once daily   polyethylene glycol powder (GLYCOLAX/MIRALAX) 17 GM/SCOOP powder Take 17 g by mouth daily. (Patient taking differently: Take 17 g by mouth daily as needed for mild constipation or moderate constipation.)   rosuvastatin (CRESTOR) 40 MG tablet TAKE 1 TABLET BY MOUTH ONCE EVERY EVENING   solifenacin (VESICARE) 10 MG tablet Take 1 tablet (10 mg total) by mouth daily.   traMADol (ULTRAM) 50 MG tablet TAKE 2 TABLETS (100 MG TOTAL) BY MOUTH EVERY 12 (TWELVE) HOURS AS NEEDED.   venlafaxine XR (  EFFEXOR-XR) 150 MG 24 hr capsule Take 1 capsule (150 mg total) by mouth 2 (two) times daily.   [DISCONTINUED] Dulaglutide (TRULICITY) 0.75 MG/0.5ML SOAJ Inject 0.75 mg into the skin once a week.   No facility-administered medications prior to visit.    Review of Systems  Constitutional: Negative.   HENT: Negative.    Eyes: Negative.   Respiratory: Negative.  Negative for shortness of breath.   Cardiovascular: Negative.   Gastrointestinal: Negative.  Negative for constipation and diarrhea.  Genitourinary: Negative.   Musculoskeletal:  Negative for joint pain.  Skin: Negative.   Neurological: Negative.  Negative for dizziness.  Endo/Heme/Allergies: Negative.   All other systems reviewed and are negative.      Objective:   BP 124/68   Pulse 96   Ht 5\' 6"  (1.676 m)   Wt 285 lb (129.3 kg)   LMP 03/24/2021   SpO2 98%   BMI 46.00 kg/m   Vitals:   06/17/23 1347  BP: 124/68  Pulse: 96  Height: 5\' 6"  (1.676 m)  Weight: 285 lb (129.3 kg)  SpO2: 98%  BMI (Calculated): 46.02    Physical Exam Vitals and nursing note reviewed.  Constitutional:      Appearance: Normal appearance. She is normal weight.  HENT:     Head: Normocephalic and atraumatic.     Nose: Nose normal.     Mouth/Throat:     Mouth: Mucous membranes are moist.   Eyes:     Extraocular Movements: Extraocular movements intact.     Conjunctiva/sclera: Conjunctivae normal.     Pupils: Pupils are equal, round, and reactive to light.  Cardiovascular:     Rate and Rhythm: Normal rate and regular rhythm.     Pulses: Normal pulses.     Heart sounds: Normal heart sounds.  Pulmonary:     Effort: Pulmonary effort is normal.     Breath sounds: Normal breath sounds.  Abdominal:     General: Abdomen is flat. Bowel sounds are normal.     Palpations: Abdomen is soft.  Musculoskeletal:        General: Normal range of motion.     Cervical back: Normal range of motion.  Skin:    General: Skin is warm and dry.  Neurological:     General: No focal deficit present.     Mental Status: She is alert and oriented to person, place, and time.  Psychiatric:        Mood and Affect: Mood normal.        Behavior: Behavior normal.        Thought Content: Thought content normal.        Judgment: Judgment normal.      No results found for any visits on 06/17/23.  Recent Results (from the past 2160 hours)  CBC with Differential/Platelet     Status: Abnormal   Collection Time: 04/05/23 10:13 AM  Result Value Ref Range   WBC 6.7 3.4 - 10.8 x10E3/uL   RBC 4.14 3.77 - 5.28 x10E6/uL   Hemoglobin 11.2 11.1 - 15.9 g/dL   Hematocrit 16.1 09.6 - 46.6 %   MCV 85 79 - 97 fL   MCH 27.1 26.6 - 33.0 pg   MCHC 31.7 31.5 - 35.7 g/dL   RDW 04.5 (H) 40.9 - 81.1 %   Platelets 335 150 - 450 x10E3/uL   Neutrophils 44 Not Estab. %   Lymphs 42 Not Estab. %   Monocytes 8 Not Estab. %   Eos 5  Not Estab. %   Basos 1 Not Estab. %   Neutrophils Absolute 2.9 1.4 - 7.0 x10E3/uL   Lymphocytes Absolute 2.8 0.7 - 3.1 x10E3/uL   Monocytes Absolute 0.5 0.1 - 0.9 x10E3/uL   EOS (ABSOLUTE) 0.3 0.0 - 0.4 x10E3/uL   Basophils Absolute 0.1 0.0 - 0.2 x10E3/uL   Immature Granulocytes 0 Not Estab. %   Immature Grans (Abs) 0.0 0.0 - 0.1 x10E3/uL  CMP14+EGFR     Status: Abnormal   Collection  Time: 04/05/23 10:13 AM  Result Value Ref Range   Glucose 94 70 - 99 mg/dL   BUN 13 6 - 24 mg/dL   Creatinine, Ser 4.09 0.57 - 1.00 mg/dL   eGFR 91 >81 XB/JYN/8.29   BUN/Creatinine Ratio 17 9 - 23   Sodium 141 134 - 144 mmol/L   Potassium 4.4 3.5 - 5.2 mmol/L   Chloride 102 96 - 106 mmol/L   CO2 23 20 - 29 mmol/L   Calcium 9.5 8.7 - 10.2 mg/dL   Total Protein 6.5 6.0 - 8.5 g/dL   Albumin 4.1 3.8 - 4.9 g/dL   Globulin, Total 2.4 1.5 - 4.5 g/dL   Bilirubin Total 0.4 0.0 - 1.2 mg/dL   Alkaline Phosphatase 192 (H) 44 - 121 IU/L   AST 31 0 - 40 IU/L   ALT 30 0 - 32 IU/L  Lipid panel     Status: None   Collection Time: 04/05/23 10:13 AM  Result Value Ref Range   Cholesterol, Total 147 100 - 199 mg/dL   Triglycerides 562 0 - 149 mg/dL   HDL 59 >13 mg/dL   VLDL Cholesterol Cal 20 5 - 40 mg/dL   LDL Chol Calc (NIH) 68 0 - 99 mg/dL   Chol/HDL Ratio 2.5 0.0 - 4.4 ratio    Comment:                                   T. Chol/HDL Ratio                                             Men  Women                               1/2 Avg.Risk  3.4    3.3                                   Avg.Risk  5.0    4.4                                2X Avg.Risk  9.6    7.1                                3X Avg.Risk 23.4   11.0   Hemoglobin A1c     Status: Abnormal   Collection Time: 04/05/23 10:13 AM  Result Value Ref Range   Hgb A1c MFr Bld 5.8 (H) 4.8 - 5.6 %    Comment:          Prediabetes: 5.7 -  6.4          Diabetes: >6.4          Glycemic control for adults with diabetes: <7.0    Est. average glucose Bld gHb Est-mCnc 120 mg/dL  TSH     Status: None   Collection Time: 04/05/23 10:13 AM  Result Value Ref Range   TSH 1.280 0.450 - 4.500 uIU/mL      Assessment & Plan:  Change Trulicity to Ozempic.   Problem List Items Addressed This Visit       Other   Prediabetes - Primary    Return in about 6 weeks (around 07/29/2023).   Total time spent: 25 minutes  Google,  NP  06/17/2023   This document may have been prepared by Dragon Voice Recognition software and as such may include unintentional dictation errors.

## 2023-06-20 ENCOUNTER — Ambulatory Visit: Payer: Medicaid Other | Admitting: Cardiology

## 2023-06-20 ENCOUNTER — Encounter: Payer: Self-pay | Admitting: Cardiology

## 2023-06-20 VITALS — BP 132/80 | HR 103 | Ht 66.0 in | Wt 287.0 lb

## 2023-06-20 DIAGNOSIS — R590 Localized enlarged lymph nodes: Secondary | ICD-10-CM | POA: Insufficient documentation

## 2023-06-20 DIAGNOSIS — Z0130 Encounter for examination of blood pressure without abnormal findings: Secondary | ICD-10-CM

## 2023-06-20 MED ORDER — FLUCONAZOLE 150 MG PO TABS: ORAL_TABLET | ORAL | 0 refills | Status: DC

## 2023-06-20 MED ORDER — AMOXICILLIN-POT CLAVULANATE 875-125 MG PO TABS
1.0000 | ORAL_TABLET | Freq: Two times a day (BID) | ORAL | 0 refills | Status: AC
Start: 2023-06-20 — End: 2023-06-27

## 2023-06-20 NOTE — Patient Instructions (Addendum)
 Augmentin Warm compress Xyzal as prescribed Drink plenty of water Diflucan as needed

## 2023-06-20 NOTE — Progress Notes (Signed)
 Established Patient Office Visit  Subjective:  Patient ID: Anna Keller, female    DOB: March 16, 1971  Age: 53 y.o. MRN: [id number]  Chief Complaint  Patient presents with   Acute Visit    L Side Face Swollen    Patient in office for an acute visit, complaining of left sided facial edema. Patient states she woke up this morning with edema of left side of face. On exam, submandibular lymphadenopathy. Patient denies recent illness. Sent in Augmentin. Increase fluid intake, warm compress.     No other concerns at this time.   Past Medical History:  Diagnosis Date   ADHD (attention deficit hyperactivity disorder)    Ankylosing spondylitis (HCC)    Anxiety    Arthritis    Depression    Eczema    Family history of adverse reaction to anesthesia    dad would be crazy, talk out of his head-per pt   Headache    Rheumatoid arthritis (HCC)     Past Surgical History:  Procedure Laterality Date   BLADDER SUSPENSION N/A 04/14/2021   Procedure: TRANSVAGINAL TAPE (TVT) PROCEDURE;  Surgeon: Essie Hart, MD;  Location: [facility];  Service: Gynecology;  Laterality: N/A;  GYNCARE TVT EXACT   CYSTOSCOPY N/A 04/14/2021   Procedure: CYSTOSCOPY;  Surgeon: Essie Hart, MD;  Location: [facility];  Service: Gynecology;  Laterality: N/A;   KNEE ARTHROSCOPY Left 1988   REVERSE SHOULDER ARTHROPLASTY Right 01/05/2022   Procedure: RIGHT SHOULDER REVERSE SHOULDER ARTHROPLASTY;  Surgeon: Huel Cote, MD;  Location: [facility];  Service: Orthopedics;  Laterality: Right;   REVERSE SHOULDER ARTHROPLASTY Right 04/01/2022   Procedure: RIGHT REVERSE SHOULDER ARTHROPLASTY REVISION;  Surgeon: Huel Cote, MD;  Location: [facility];  Service: Orthopedics;  Laterality: Right;   REVERSE SHOULDER ARTHROPLASTY Right 07/22/2022   Procedure: RIGHT SHOULDER REVISION  ARTHROPLASTY;  Surgeon: Huel Cote, MD;  Location: [facility];  Service: Orthopedics;  Laterality: Right;   TONSILLECTOMY     as a child   TOTAL LAPAROSCOPIC  HYSTERECTOMY WITH SALPINGECTOMY Bilateral 04/14/2021   Procedure: TOTAL LAPAROSCOPIC HYSTERECTOMY WITH BILATERAL SALPINGECTOMY AND BILATERAL OOPHERECTOMY;  Surgeon: Essie Hart, MD;  Location: [facility];  Service: Gynecology;  Laterality: Bilateral;    Social History   Socioeconomic History   Marital status: Married    Spouse name: Casimiro Needle   Number of children: 1   Years of education: 12   Highest education level: High school graduate  Occupational History   Not on file  Tobacco Use   Smoking status: Former    Current packs/day: 0.00    Average packs/day: 1 pack/day for 25.0 years (25.0 ttl pk-yrs)    Types: Cigarettes    Start date: 01/09/1991    Quit date: 01/09/2016    Years since quitting: 7.4   Smokeless tobacco: Never  Vaping Use   Vaping status: Never Used  Substance and Sexual Activity   Alcohol use: Not Currently   Drug use: No   Sexual activity: Yes    Partners: Male    Birth control/protection: None  Other Topics Concern   Not on file  Social History Narrative   Not on file   Social Drivers of Health   Financial Resource Strain: Low Risk  (07/14/2018)   Overall Financial Resource Strain (CARDIA)    Difficulty of Paying Living Expenses: Not hard at all  Food Insecurity: No Food Insecurity (07/14/2018)   Hunger Vital Sign    Worried About Running Out of Food in the Last Year:  Never true    Ran Out of Food in the Last Year: Never true  Transportation Needs: No Transportation Needs (04/02/2022)   PRAPARE - Administrator, Civil Service (Medical): No    Lack of Transportation (Non-Medical): No  Physical Activity: Inactive (07/14/2018)   Exercise Vital Sign    Days of Exercise per Week: 0 days    Minutes of Exercise per Session: 0 min  Stress: No Stress Concern Present (07/14/2018)   [employer] of Occupational Health - Occupational Stress Questionnaire    Feeling of Stress : Only a little  Social Connections: Somewhat Isolated (07/14/2018)   Social  Connection and Isolation Panel [NHANES]    Frequency of Communication with Friends and Family: More than three times a week    Frequency of Social Gatherings with Friends and Family: More than three times a week    Attends Religious Services: Never    Database administrator or Organizations: No    Attends Banker Meetings: Never    Marital Status: Married  Catering manager Violence: Not At Risk (04/02/2022)   Humiliation, Afraid, Rape, and Kick questionnaire    Fear of Current or Ex-Partner: No    Emotionally Abused: No    Physically Abused: No    Sexually Abused: No    Family History  Problem Relation Age of Onset   Hypertension Mother    Arthritis Father    Heart disease Sister    Pulmonary Hypertension Sister    COPD Sister    Diabetes Sister    Breast cancer Maternal Grandmother    Heart attack Paternal Grandmother    Breast cancer Maternal Aunt    Breast cancer Cousin     Allergies  Allergen Reactions   Codeine Nausea And Vomiting    Outpatient Medications Prior to Visit  Medication Sig   Abatacept (ORENCIA IV) Inject 1 Units into the vein every 30 (thirty) days.   ALPRAZolam (XANAX) 1 MG tablet Take 1 tablet (1 mg total) by mouth 3 (three) times daily as needed for anxiety.   amLODipine (NORVASC) 5 MG tablet Take 1 tablet (5 mg total) by mouth daily.   amphetamine-dextroamphetamine (ADDERALL) 20 MG tablet Take 1 tablet (20 mg total) by mouth 2 (two) times daily.   clobetasol ointment (TEMOVATE) 0.05 % Apply topically 2 (two) times daily.   diclofenac (VOLTAREN) 50 MG EC tablet TAKE 1 TABLET BY MOUTH TWICE DAILY WITH FOOD AND LARGE GLASS OF WATER WITH EACH DOSE(STOP CELECOXIB)   doxepin (SINEQUAN) 25 MG capsule TAKE 1 CAPSULE BY MOUTH EVERYDAY AT BEDTIME   FLUoxetine (PROZAC) 40 MG capsule Take 40 mg by mouth 2 (two) times daily.   gabapentin (NEURONTIN) 600 MG tablet Take 600 mg by mouth 2 (two) times daily.   levocetirizine (XYZAL) 5 MG tablet Take 5 mg  by mouth every evening.   Multiple Vitamins-Minerals (MULTIVITAMIN WITH MINERALS) tablet Take 1 tablet by mouth daily.   omeprazole (PRILOSEC) 20 MG capsule Take 1 capsule by mouth once daily   polyethylene glycol powder (GLYCOLAX/MIRALAX) 17 GM/SCOOP powder Take 17 g by mouth daily. (Patient taking differently: Take 17 g by mouth daily as needed for mild constipation or moderate constipation.)   rosuvastatin (CRESTOR) 40 MG tablet TAKE 1 TABLET BY MOUTH ONCE EVERY EVENING   Semaglutide,0.25 or 0.5MG /DOS, 2 MG/3ML SOPN Inject 0.25 mg into the skin once a week.   solifenacin (VESICARE) 10 MG tablet Take 1 tablet (10 mg total) by mouth daily.  traMADol (ULTRAM) 50 MG tablet TAKE 2 TABLETS (100 MG TOTAL) BY MOUTH EVERY 12 (TWELVE) HOURS AS NEEDED.   venlafaxine XR (EFFEXOR-XR) 150 MG 24 hr capsule Take 1 capsule (150 mg total) by mouth 2 (two) times daily.   No facility-administered medications prior to visit.    Review of Systems  Constitutional: Negative.   HENT: Negative.    Eyes: Negative.   Respiratory: Negative.  Negative for shortness of breath.   Cardiovascular: Negative.  Negative for chest pain.  Gastrointestinal: Negative.  Negative for abdominal pain, constipation and diarrhea.  Genitourinary: Negative.   Musculoskeletal:  Negative for joint pain and myalgias.  Skin: Negative.   Neurological: Negative.  Negative for dizziness and headaches.  Endo/Heme/Allergies: Negative.   All other systems reviewed and are negative.      Objective:   BP 132/80   Pulse (!) 103   Ht 5\' 6"  (1.676 m)   Wt 287 lb (130.2 kg)   LMP 03/24/2021   SpO2 96%   BMI 46.32 kg/m   Vitals:   06/20/23 1458  BP: 132/80  Pulse: (!) 103  Height: 5\' 6"  (1.676 m)  Weight: 287 lb (130.2 kg)  SpO2: 96%  BMI (Calculated): 46.35    Physical Exam Vitals and nursing note reviewed.  Constitutional:      Appearance: Normal appearance. She is normal weight.  HENT:     Head: Normocephalic and  atraumatic.      Comments: Lymphadenopathy     Nose: Nose normal.     Mouth/Throat:     Mouth: Mucous membranes are moist.  Eyes:     Extraocular Movements: Extraocular movements intact.     Conjunctiva/sclera: Conjunctivae normal.     Pupils: Pupils are equal, round, and reactive to light.  Cardiovascular:     Rate and Rhythm: Normal rate and regular rhythm.     Pulses: Normal pulses.     Heart sounds: Normal heart sounds.  Pulmonary:     Effort: Pulmonary effort is normal.     Breath sounds: Normal breath sounds.  Abdominal:     General: Abdomen is flat. Bowel sounds are normal.     Palpations: Abdomen is soft.  Musculoskeletal:        General: Normal range of motion.     Cervical back: Normal range of motion.  Skin:    General: Skin is warm and dry.  Neurological:     General: No focal deficit present.     Mental Status: She is alert and oriented to person, place, and time.  Psychiatric:        Mood and Affect: Mood normal.        Behavior: Behavior normal.        Thought Content: Thought content normal.        Judgment: Judgment normal.      No results found for any visits on 06/20/23.  Recent Results (from the past 2160 hours)  CBC with Differential/Platelet     Status: Abnormal   Collection Time: 04/05/23 10:13 AM  Result Value Ref Range   WBC 6.7 3.4 - 10.8 x10E3/uL   RBC 4.14 3.77 - 5.28 x10E6/uL   Hemoglobin 11.2 11.1 - 15.9 g/dL   Hematocrit 09.6 04.5 - 46.6 %   MCV 85 79 - 97 fL   MCH 27.1 26.6 - 33.0 pg   MCHC 31.7 31.5 - 35.7 g/dL   RDW 40.9 (H) 81.1 - 91.4 %   Platelets 335 150 - 450 x10E3/uL  Neutrophils 44 Not Estab. %   Lymphs 42 Not Estab. %   Monocytes 8 Not Estab. %   Eos 5 Not Estab. %   Basos 1 Not Estab. %   Neutrophils Absolute 2.9 1.4 - 7.0 x10E3/uL   Lymphocytes Absolute 2.8 0.7 - 3.1 x10E3/uL   Monocytes Absolute 0.5 0.1 - 0.9 x10E3/uL   EOS (ABSOLUTE) 0.3 0.0 - 0.4 x10E3/uL   Basophils Absolute 0.1 0.0 - 0.2 x10E3/uL    Immature Granulocytes 0 Not Estab. %   Immature Grans (Abs) 0.0 0.0 - 0.1 x10E3/uL  CMP14+EGFR     Status: Abnormal   Collection Time: 04/05/23 10:13 AM  Result Value Ref Range   Glucose 94 70 - 99 mg/dL   BUN 13 6 - 24 mg/dL   Creatinine, Ser 2.95 0.57 - 1.00 mg/dL   eGFR 91 >62 ZH/YQM/5.78   BUN/Creatinine Ratio 17 9 - 23   Sodium 141 134 - 144 mmol/L   Potassium 4.4 3.5 - 5.2 mmol/L   Chloride 102 96 - 106 mmol/L   CO2 23 20 - 29 mmol/L   Calcium 9.5 8.7 - 10.2 mg/dL   Total Protein 6.5 6.0 - 8.5 g/dL   Albumin 4.1 3.8 - 4.9 g/dL   Globulin, Total 2.4 1.5 - 4.5 g/dL   Bilirubin Total 0.4 0.0 - 1.2 mg/dL   Alkaline Phosphatase 192 (H) 44 - 121 IU/L   AST 31 0 - 40 IU/L   ALT 30 0 - 32 IU/L  Lipid panel     Status: None   Collection Time: 04/05/23 10:13 AM  Result Value Ref Range   Cholesterol, Total 147 100 - 199 mg/dL   Triglycerides 469 0 - 149 mg/dL   HDL 59 >62 mg/dL   VLDL Cholesterol Cal 20 5 - 40 mg/dL   LDL Chol Calc (NIH) 68 0 - 99 mg/dL   Chol/HDL Ratio 2.5 0.0 - 4.4 ratio    Comment:                                   T. Chol/HDL Ratio                                             Men  Women                               1/2 Avg.Risk  3.4    3.3                                   Avg.Risk  5.0    4.4                                2X Avg.Risk  9.6    7.1                                3X Avg.Risk 23.4   11.0   Hemoglobin A1c     Status: Abnormal   Collection Time: 04/05/23 10:13 AM  Result Value Ref Range   Hgb A1c  MFr Bld 5.8 (H) 4.8 - 5.6 %    Comment:          Prediabetes: 5.7 - 6.4          Diabetes: >6.4          Glycemic control for adults with diabetes: <7.0    Est. average glucose Bld gHb Est-mCnc 120 mg/dL  TSH     Status: None   Collection Time: 04/05/23 10:13 AM  Result Value Ref Range   TSH 1.280 0.450 - 4.500 uIU/mL      Assessment & Plan:  Augmentin Warm compress Xyzal as prescribed Drink plenty of water Diflucan as needed for yeast  infection on ATB.   Problem List Items Addressed This Visit       Immune and Lymphatic   Lymphadenopathy, submandibular - Primary    Return if symptoms worsen or fail to improve, for as scheduled.   Total time spent: 25 minutes  Google, NP  06/20/2023   This document may have been prepared by Dragon Voice Recognition software and as such may include unintentional dictation errors.

## 2023-07-04 ENCOUNTER — Encounter: Payer: Self-pay | Admitting: Cardiology

## 2023-07-04 ENCOUNTER — Other Ambulatory Visit: Payer: Self-pay | Admitting: Cardiology

## 2023-07-06 ENCOUNTER — Other Ambulatory Visit: Payer: Self-pay | Admitting: Cardiology

## 2023-07-06 MED ORDER — MOUNJARO 2.5 MG/0.5ML ~~LOC~~ SOAJ: 2.5000 mg | SUBCUTANEOUS | 3 refills | Status: DC

## 2023-07-07 ENCOUNTER — Ambulatory Visit: Payer: 59 | Admitting: Cardiology

## 2023-07-11 ENCOUNTER — Encounter: Payer: Self-pay | Admitting: Cardiology

## 2023-07-11 ENCOUNTER — Other Ambulatory Visit: Payer: Self-pay

## 2023-07-11 DIAGNOSIS — F900 Attention-deficit hyperactivity disorder, predominantly inattentive type: Secondary | ICD-10-CM

## 2023-07-13 MED ORDER — AMPHETAMINE-DEXTROAMPHETAMINE 20 MG PO TABS: 20.0000 mg | ORAL_TABLET | Freq: Two times a day (BID) | ORAL | 0 refills | Status: DC

## 2023-07-25 ENCOUNTER — Encounter: Payer: Self-pay | Admitting: Cardiology

## 2023-07-25 ENCOUNTER — Other Ambulatory Visit: Payer: Self-pay | Admitting: Cardiology

## 2023-07-25 MED ORDER — WEGOVY 0.25 MG/0.5ML ~~LOC~~ SOAJ: 0.2500 mg | SUBCUTANEOUS | 3 refills | Status: DC

## 2023-07-28 ENCOUNTER — Other Ambulatory Visit: Payer: Self-pay | Admitting: Internal Medicine

## 2023-07-28 DIAGNOSIS — G4700 Insomnia, unspecified: Secondary | ICD-10-CM

## 2023-07-29 ENCOUNTER — Ambulatory Visit: Payer: Medicaid Other | Admitting: Cardiology

## 2023-08-02 ENCOUNTER — Encounter: Payer: Self-pay | Admitting: Cardiology

## 2023-08-05 ENCOUNTER — Other Ambulatory Visit: Payer: Self-pay | Admitting: Cardiology

## 2023-08-05 DIAGNOSIS — Z1231 Encounter for screening mammogram for malignant neoplasm of breast: Secondary | ICD-10-CM

## 2023-08-09 ENCOUNTER — Other Ambulatory Visit: Payer: Self-pay

## 2023-08-09 ENCOUNTER — Encounter: Payer: Self-pay | Admitting: Cardiology

## 2023-08-09 MED ORDER — ROSUVASTATIN CALCIUM 40 MG PO TABS: 40.0000 mg | ORAL_TABLET | Freq: Every day | ORAL | 3 refills | Status: AC

## 2023-08-11 ENCOUNTER — Other Ambulatory Visit: Payer: Self-pay | Admitting: Internal Medicine

## 2023-08-11 ENCOUNTER — Encounter: Payer: Self-pay | Admitting: Cardiology

## 2023-08-11 ENCOUNTER — Other Ambulatory Visit: Payer: Self-pay

## 2023-08-11 ENCOUNTER — Encounter

## 2023-08-11 DIAGNOSIS — Z1231 Encounter for screening mammogram for malignant neoplasm of breast: Secondary | ICD-10-CM

## 2023-08-11 DIAGNOSIS — F900 Attention-deficit hyperactivity disorder, predominantly inattentive type: Secondary | ICD-10-CM

## 2023-08-12 ENCOUNTER — Other Ambulatory Visit: Payer: Self-pay | Admitting: Family

## 2023-08-12 ENCOUNTER — Other Ambulatory Visit: Payer: Self-pay

## 2023-08-12 ENCOUNTER — Encounter: Payer: Self-pay | Admitting: Cardiology

## 2023-08-12 DIAGNOSIS — F900 Attention-deficit hyperactivity disorder, predominantly inattentive type: Secondary | ICD-10-CM

## 2023-08-12 MED ORDER — AMPHETAMINE-DEXTROAMPHETAMINE 20 MG PO TABS: 20.0000 mg | ORAL_TABLET | Freq: Two times a day (BID) | ORAL | 0 refills | Status: DC

## 2023-08-15 ENCOUNTER — Other Ambulatory Visit: Payer: Self-pay | Admitting: Cardiology

## 2023-08-15 DIAGNOSIS — E66813 Obesity, class 3: Secondary | ICD-10-CM

## 2023-08-15 DIAGNOSIS — K760 Fatty (change of) liver, not elsewhere classified: Secondary | ICD-10-CM

## 2023-08-19 ENCOUNTER — Ambulatory Visit
Admission: RE | Admit: 2023-08-19 | Discharge: 2023-08-19 | Disposition: A | Discharge status: Home / Self Care | Source: Ambulatory Visit

## 2023-08-19 DIAGNOSIS — Z1231 Encounter for screening mammogram for malignant neoplasm of breast: Secondary | ICD-10-CM

## 2023-08-25 ENCOUNTER — Encounter: Payer: Self-pay | Admitting: Dietician

## 2023-08-25 ENCOUNTER — Encounter: Payer: Self-pay | Admitting: Cardiology

## 2023-08-25 ENCOUNTER — Other Ambulatory Visit: Payer: Self-pay | Admitting: Cardiology

## 2023-08-25 ENCOUNTER — Encounter: Attending: Cardiology | Admitting: Dietician

## 2023-08-25 DIAGNOSIS — Z713 Dietary counseling and surveillance: Secondary | ICD-10-CM | POA: Insufficient documentation

## 2023-08-25 DIAGNOSIS — Z6841 Body Mass Index (BMI) 40.0 and over, adult: Secondary | ICD-10-CM | POA: Diagnosis present

## 2023-08-25 DIAGNOSIS — K760 Fatty (change of) liver, not elsewhere classified: Secondary | ICD-10-CM | POA: Insufficient documentation

## 2023-08-25 DIAGNOSIS — E66813 Obesity, class 3: Secondary | ICD-10-CM | POA: Diagnosis present

## 2023-08-25 NOTE — Progress Notes (Signed)
 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  Pt reports needing BL knee replacements, needs BMI to be below 40 kg/m2 to be eligible. Pt reports very little activity r/t knee and shoulder pain. Pt reports frequent nausea, states lots of different foods has become less palatable and visually appealing over the last 6 months, states they believe it is related to their medications. Pt reports emotional eating, states they will eat large amounts of junkfood (pretzels) when depressed.   Estimated daily fluid intake: 48-64 oz Supplements: MVI Sleep: Poorly r/t shoulder pain Stress / self-care: 10/10, Stresses over health Current average weekly physical activity: ADLs, very limited r/t knees  24-Hr Dietary Recall First Meal: 2 cups frozen strawberries, 2 cups frozen blueberries, coffee w/ syagr free creamer Snack:   Second Meal: Pretzel snacks Snack:  Third Meal: 2 PB and J carb friendly wraps Snack:  Beverages: Coffee, sugar-free tea,    NUTRITION DIAGNOSIS  NB-1.1 Food and nutrition-related knowledge deficit As related to obesity.  As evidenced by 46.32 kg/m2, diet history of overconsumption of energy dense foods.   NUTRITION INTERVENTION  Nutrition education (E-1) on the following topics:  Educated patient on the  balanced plate eating model. Recommended lunch and dinner be 1/2 non-starchy vegetables, 1/4 starches, and 1/4 protein. Recommended breakfast be a balance of starch and protein with a piece of fruit. Discussed with patient the importance of working towards hitting the proportions of the balanced plate consistently. Counseled patient on ways to begin recognizing each of the food groups from the balanced plate in their own meals, and how close they are to fitting the recommended proportions of the balanced plate. Educated patient on the nutritional value of each food group on the balanced plate model. Counseled patient on beginning to rebuild their trust in themselves to make the right food choices for their health. Educated patient on mindful eating, including listening to their body's hunger and satiety cues, as well as eating slowly and allowing meals to be more of a sensory experience. Counseled patient on allowing themselves to be present in their emotions when they consider emotional eating. Advised patient to evaluate whether the impulse to eat is hunger based, or emotionally driven.   Handouts Provided Include  1,500 Calorie 5 day Sample Menu Protein foods list Balanced Plate  Learning Style & Readiness for Change Teaching method utilized: Visual & Auditory  Demonstrated degree of understanding via: Teach Back  Barriers to learning/adherence to lifestyle change: Emotional eating  Goals Established by Pt Work towards eating three meals a day, about 5-6 hours apart! Begin to recognize carbohydrates, proteins, and non-starchy vegetables in your food choices! Begin to build your meals using the proportions of the Balanced Plate. First, select your carb choice(s) for the meal. Make this 25% of your meal. Next, select your source of protein to pair with your carb choice(s). Make this another 25%  of your meal. Choose LEAN proteins. Finally, complete your meal with a variety of non-starchy  vegetables. Make this the remaining 50% of your meal. Snack on cucumbers as often as you like, try having celery stalks as a snack as well. Try having 1/2 of a Premier Protein shake with your coffee and reduce your berries to a portion of 2 cups for breakfast. Add in a protein bar as a snack as well! Look for options with more than 10g of protein per bar!   MONITORING & EVALUATION Dietary intake, weekly physical activity, and weight change PRN.  Next Steps  Patient is to schedule follow-up PRN.

## 2023-08-25 NOTE — Patient Instructions (Addendum)
 Work towards eating three meals a day, about 5-6 hours apart!  Begin to recognize carbohydrates, proteins, and non-starchy vegetables in your food choices!  Begin to build your meals using the proportions of the Balanced Plate. First, select your carb choice(s) for the meal. Make this 25% of your meal. Next, select your source of protein to pair with your carb choice(s). Make this another 25% of your meal. Choose LEAN proteins. Finally, complete your meal with a variety of non-starchy vegetables. Make this the remaining 50% of your meal.  Snack on cucumbers as often as you like, try having celery stalks as a snack as well.  Try having 1/2 of a Premier Protein shake with your coffee and reduce your berries to a portion of 2 cups for breakfast.  Add in a protein bar as a snack as well! Look for options with more than 10g of protein per bar!

## 2023-09-02 ENCOUNTER — Other Ambulatory Visit: Payer: Self-pay

## 2023-09-05 ENCOUNTER — Other Ambulatory Visit: Payer: Self-pay

## 2023-09-05 ENCOUNTER — Encounter: Payer: Self-pay | Admitting: Cardiology

## 2023-09-05 DIAGNOSIS — F900 Attention-deficit hyperactivity disorder, predominantly inattentive type: Secondary | ICD-10-CM

## 2023-09-05 MED ORDER — FLUOXETINE HCL 40 MG PO CAPS: 40.0000 mg | ORAL_CAPSULE | Freq: Two times a day (BID) | ORAL | 0 refills | Status: DC

## 2023-09-05 NOTE — Telephone Encounter (Signed)
 We have sent  the request for the adderall

## 2023-09-06 ENCOUNTER — Encounter: Payer: Self-pay | Admitting: Cardiology

## 2023-09-06 ENCOUNTER — Other Ambulatory Visit: Payer: Self-pay

## 2023-09-06 MED ORDER — AMPHETAMINE-DEXTROAMPHETAMINE 20 MG PO TABS: 20.0000 mg | ORAL_TABLET | Freq: Two times a day (BID) | ORAL | 0 refills | Status: DC

## 2023-09-06 MED ORDER — SOLIFENACIN SUCCINATE 10 MG PO TABS: 10.0000 mg | ORAL_TABLET | Freq: Every day | ORAL | 5 refills | Status: DC

## 2023-09-14 ENCOUNTER — Other Ambulatory Visit: Payer: Self-pay | Admitting: Cardiology

## 2023-09-20 ENCOUNTER — Encounter: Payer: Self-pay | Admitting: Cardiology

## 2023-09-22 NOTE — Telephone Encounter (Signed)
 See other message

## 2023-09-28 ENCOUNTER — Encounter: Payer: Self-pay | Admitting: Cardiology

## 2023-10-03 ENCOUNTER — Other Ambulatory Visit: Payer: Self-pay | Admitting: Internal Medicine

## 2023-10-03 ENCOUNTER — Encounter: Payer: Self-pay | Admitting: Cardiology

## 2023-10-03 DIAGNOSIS — G4700 Insomnia, unspecified: Secondary | ICD-10-CM

## 2023-10-03 DIAGNOSIS — F900 Attention-deficit hyperactivity disorder, predominantly inattentive type: Secondary | ICD-10-CM

## 2023-10-03 MED ORDER — AMPHETAMINE-DEXTROAMPHETAMINE 20 MG PO TABS: 20.0000 mg | ORAL_TABLET | Freq: Two times a day (BID) | ORAL | 0 refills | Status: DC

## 2023-10-10 ENCOUNTER — Ambulatory Visit: Admitting: Dietician

## 2023-10-19 ENCOUNTER — Encounter: Payer: Self-pay | Admitting: Cardiology

## 2023-10-31 ENCOUNTER — Other Ambulatory Visit: Payer: Self-pay

## 2023-10-31 ENCOUNTER — Encounter: Payer: Self-pay | Admitting: Cardiology

## 2023-10-31 DIAGNOSIS — F900 Attention-deficit hyperactivity disorder, predominantly inattentive type: Secondary | ICD-10-CM

## 2023-11-01 ENCOUNTER — Encounter: Payer: Self-pay | Admitting: Cardiology

## 2023-11-01 DIAGNOSIS — I10 Essential (primary) hypertension: Secondary | ICD-10-CM

## 2023-11-01 DIAGNOSIS — F900 Attention-deficit hyperactivity disorder, predominantly inattentive type: Secondary | ICD-10-CM

## 2023-11-02 ENCOUNTER — Encounter: Payer: Self-pay | Admitting: Internal Medicine

## 2023-11-02 ENCOUNTER — Other Ambulatory Visit: Payer: Self-pay | Admitting: Internal Medicine

## 2023-11-02 DIAGNOSIS — I10 Essential (primary) hypertension: Secondary | ICD-10-CM

## 2023-11-02 MED ORDER — CLOBETASOL PROPIONATE 0.05 % EX OINT: TOPICAL_OINTMENT | Freq: Two times a day (BID) | CUTANEOUS | 1 refills | Status: AC

## 2023-11-02 MED ORDER — FLUOXETINE HCL 40 MG PO CAPS: 40.0000 mg | ORAL_CAPSULE | Freq: Two times a day (BID) | ORAL | 0 refills | Status: DC

## 2023-11-02 MED ORDER — AMLODIPINE BESYLATE 5 MG PO TABS: 5.0000 mg | ORAL_TABLET | Freq: Every day | ORAL | 0 refills | Status: DC

## 2023-11-03 ENCOUNTER — Encounter: Payer: Self-pay | Admitting: Cardiology

## 2023-11-03 ENCOUNTER — Ambulatory Visit: Admitting: Cardiology

## 2023-11-03 ENCOUNTER — Other Ambulatory Visit: Payer: Self-pay | Admitting: Cardiology

## 2023-11-03 VITALS — BP 112/82 | HR 89 | Ht 66.0 in | Wt 295.2 lb

## 2023-11-03 DIAGNOSIS — R233 Spontaneous ecchymoses: Secondary | ICD-10-CM

## 2023-11-03 DIAGNOSIS — I10 Essential (primary) hypertension: Secondary | ICD-10-CM

## 2023-11-03 DIAGNOSIS — E782 Mixed hyperlipidemia: Secondary | ICD-10-CM

## 2023-11-03 DIAGNOSIS — Z1329 Encounter for screening for other suspected endocrine disorder: Secondary | ICD-10-CM | POA: Diagnosis not present

## 2023-11-03 DIAGNOSIS — R7303 Prediabetes: Secondary | ICD-10-CM

## 2023-11-03 MED ORDER — MOUNJARO 2.5 MG/0.5ML ~~LOC~~ SOAJ: 2.5000 mg | SUBCUTANEOUS | 4 refills | Status: DC

## 2023-11-03 NOTE — Progress Notes (Signed)
 Established Patient Office Visit  Subjective:  Patient ID: Anna Keller, female    DOB: 1971-03-13  Age: 53 y.o. MRN: [id number]  Chief Complaint  Patient presents with   Follow-up    Rx refills     Patient in office for follow up, needing prescription refills. Patient doing well, no new complaints today.  Due for blood work, will return when fasting. Patient recently tested for clotting disorder, diagnosed with Von Willebrand disease. Daughter told it is genetic, patient requesting to be tested.  Continue same medications.    No other concerns at this time.   Past Medical History:  Diagnosis Date   ADHD (attention deficit hyperactivity disorder)    Ankylosing spondylitis (HCC)    Anxiety    Arthritis    Depression    Eczema    Family history of adverse reaction to anesthesia    dad would be crazy, talk out of his head-per pt   Headache    Rheumatoid arthritis (HCC)     Past Surgical History:  Procedure Laterality Date   BLADDER SUSPENSION N/A 04/14/2021   Procedure: TRANSVAGINAL TAPE (TVT) PROCEDURE;  Surgeon: Bettina Muskrat, MD;  Location: [facility];  Service: Gynecology;  Laterality: N/A;  GYNCARE TVT EXACT   CYSTOSCOPY N/A 04/14/2021   Procedure: CYSTOSCOPY;  Surgeon: Bettina Muskrat, MD;  Location: [facility];  Service: Gynecology;  Laterality: N/A;   KNEE ARTHROSCOPY Left 1988   REVERSE SHOULDER ARTHROPLASTY Right 01/05/2022   Procedure: RIGHT SHOULDER REVERSE SHOULDER ARTHROPLASTY;  Surgeon: Genelle Standing, MD;  Location: [facility];  Service: Orthopedics;  Laterality: Right;   REVERSE SHOULDER ARTHROPLASTY Right 04/01/2022   Procedure: RIGHT REVERSE SHOULDER ARTHROPLASTY REVISION;  Surgeon: Genelle Standing, MD;  Location: [facility];  Service: Orthopedics;  Laterality: Right;   REVERSE SHOULDER ARTHROPLASTY Right 07/22/2022   Procedure: RIGHT SHOULDER REVISION  ARTHROPLASTY;  Surgeon: Genelle Standing, MD;  Location: [facility];  Service: Orthopedics;  Laterality: Right;    TONSILLECTOMY     as a child   TOTAL LAPAROSCOPIC HYSTERECTOMY WITH SALPINGECTOMY Bilateral 04/14/2021   Procedure: TOTAL LAPAROSCOPIC HYSTERECTOMY WITH BILATERAL SALPINGECTOMY AND BILATERAL OOPHERECTOMY;  Surgeon: Bettina Muskrat, MD;  Location: [facility];  Service: Gynecology;  Laterality: Bilateral;    Social History   Socioeconomic History   Marital status: Married    Spouse name: Ozell   Number of children: 1   Years of education: 12   Highest education level: High school graduate  Occupational History   Not on file  Tobacco Use   Smoking status: Former    Current packs/day: 0.00    Average packs/day: 1 pack/day for 25.0 years (25.0 ttl pk-yrs)    Types: Cigarettes    Start date: 01/09/1991    Quit date: 01/09/2016    Years since quitting: 7.8   Smokeless tobacco: Never  Vaping Use   Vaping status: Never Used  Substance and Sexual Activity   Alcohol use: Not Currently   Drug use: No   Sexual activity: Yes    Partners: Male    Birth control/protection: None  Other Topics Concern   Not on file  Social History Narrative   Not on file   Social Drivers of Health   Financial Resource Strain: Low Risk  (07/14/2018)   Overall Financial Resource Strain (CARDIA)    Difficulty of Paying Living Expenses: Not hard at all  Food Insecurity: Low Risk  (07/05/2023)   Received from [facility]   Hunger Vital Sign  Within the past 12 months, you worried that your food would run out before you got money to buy more: Never true    Within the past 12 months, the food you bought just didn't last and you didn't have money to get more. : Never true  Transportation Needs: No Transportation Needs (07/05/2023)   Received from [facility]    In the past 12 months, has lack of reliable transportation kept you from medical appointments, meetings, work or from getting things needed for daily living? : No  Physical Activity: Inactive (07/14/2018)   Exercise Vital Sign    Days of  Exercise per Week: 0 days    Minutes of Exercise per Session: 0 min  Stress: No Stress Concern Present (07/14/2018)   [employer] of Occupational Health - Occupational Stress Questionnaire    Feeling of Stress : Only a little  Social Connections: Somewhat Isolated (07/14/2018)   Social Connection and Isolation Panel    Frequency of Communication with Friends and Family: More than three times a week    Frequency of Social Gatherings with Friends and Family: More than three times a week    Attends Religious Services: Never    Database administrator or Organizations: No    Attends Banker Meetings: Never    Marital Status: Married  Catering manager Violence: Not At Risk (04/02/2022)   Humiliation, Afraid, Rape, and Kick questionnaire    Fear of Current or Ex-Partner: No    Emotionally Abused: No    Physically Abused: No    Sexually Abused: No    Family History  Problem Relation Age of Onset   Hypertension Mother    Arthritis Father    Heart disease Sister    Pulmonary Hypertension Sister    COPD Sister    Diabetes Sister    Breast cancer Maternal Grandmother    Heart attack Paternal Grandmother    Breast cancer Maternal Aunt    Breast cancer Cousin     Allergies  Allergen Reactions   Codeine Nausea And Vomiting    Outpatient Medications Prior to Visit  Medication Sig   Abatacept  (ORENCIA  IV) Inject 1 Units into the vein every 30 (thirty) days.   amLODipine  (NORVASC ) 5 MG tablet TAKE 1 TABLET (5 MG TOTAL) BY MOUTH DAILY.   amphetamine -dextroamphetamine  (ADDERALL) 20 MG tablet Take 1 tablet (20 mg total) by mouth 2 (two) times daily.   clobetasol  ointment (TEMOVATE ) 0.05 % Apply topically 2 (two) times daily.   doxepin  (SINEQUAN ) 25 MG capsule TAKE 1 CAPSULE BY MOUTH EVERYDAY AT BEDTIME   FLUoxetine  (PROZAC ) 40 MG capsule Take 1 capsule (40 mg total) by mouth 2 (two) times daily.   levocetirizine (XYZAL ) 5 MG tablet Take 5 mg by mouth every evening.    Multiple Vitamins-Minerals (MULTIVITAMIN WITH MINERALS) tablet Take 1 tablet by mouth daily.   polyethylene glycol powder (GLYCOLAX /MIRALAX ) 17 GM/SCOOP powder Take 17 g by mouth daily. (Patient taking differently: Take 17 g by mouth daily as needed for mild constipation or moderate constipation.)   rosuvastatin  (CRESTOR ) 40 MG tablet Take 1 tablet (40 mg total) by mouth daily.   solifenacin  (VESICARE ) 10 MG tablet Take 1 tablet (10 mg total) by mouth daily.   venlafaxine  XR (EFFEXOR -XR) 150 MG 24 hr capsule TAKE 1 CAPSULE BY MOUTH TWICE A DAY   [DISCONTINUED] amLODipine  (NORVASC ) 5 MG tablet Take 1 tablet (5 mg total) by mouth daily.   [DISCONTINUED] Semaglutide -Weight Management (WEGOVY ) 0.25  MG/0.5ML SOAJ Inject 0.25 mg into the skin once a week.   No facility-administered medications prior to visit.    Review of Systems  Constitutional: Negative.   HENT: Negative.    Eyes: Negative.   Respiratory: Negative.  Negative for shortness of breath.   Cardiovascular: Negative.  Negative for chest pain.  Gastrointestinal: Negative.  Negative for abdominal pain, constipation and diarrhea.  Genitourinary: Negative.   Musculoskeletal:  Negative for joint pain and myalgias.  Skin: Negative.   Neurological: Negative.  Negative for dizziness and headaches.  Endo/Heme/Allergies: Negative.   All other systems reviewed and are negative.      Objective:   BP 112/82   Pulse 89   Ht 5' 6 (1.676 m)   Wt 295 lb 3.2 oz (133.9 kg)   LMP 03/24/2021   SpO2 95%   BMI 47.65 kg/m   Vitals:   11/03/23 1344  BP: 112/82  Pulse: 89  Height: 5' 6 (1.676 m)  Weight: 295 lb 3.2 oz (133.9 kg)  SpO2: 95%  BMI (Calculated): 47.67    Physical Exam Vitals and nursing note reviewed.  Constitutional:      Appearance: Normal appearance. She is normal weight.  HENT:     Head: Normocephalic and atraumatic.     Nose: Nose normal.     Mouth/Throat:     Mouth: Mucous membranes are moist.  Eyes:      Extraocular Movements: Extraocular movements intact.     Conjunctiva/sclera: Conjunctivae normal.     Pupils: Pupils are equal, round, and reactive to light.  Cardiovascular:     Rate and Rhythm: Normal rate and regular rhythm.     Pulses: Normal pulses.     Heart sounds: Normal heart sounds.  Pulmonary:     Effort: Pulmonary effort is normal.     Breath sounds: Normal breath sounds.  Abdominal:     General: Abdomen is flat. Bowel sounds are normal.     Palpations: Abdomen is soft.  Musculoskeletal:        General: Normal range of motion.     Cervical back: Normal range of motion.  Skin:    General: Skin is warm and dry.  Neurological:     General: No focal deficit present.     Mental Status: She is alert and oriented to person, place, and time.  Psychiatric:        Mood and Affect: Mood normal.        Behavior: Behavior normal.        Thought Content: Thought content normal.        Judgment: Judgment normal.      No results found for any visits on 11/03/23.  No results found for this or any previous visit (from the past 2160 hours).    Assessment & Plan:  Return for fasting lab work. Continue same medications.  Problem List Items Addressed This Visit       Cardiovascular and Mediastinum   Essential hypertension, benign - Primary   Relevant Orders   CMP14+EGFR     Other   Prediabetes   Relevant Orders   Hemoglobin A1c   Hyperlipidemia   Relevant Orders   Lipid panel   Other Visit Diagnoses       Thyroid  disorder screening       Relevant Orders   TSH     Easy bruising       Relevant Orders   Von Willebrand panel   CBC with Differential/Platelet  Return in about 4 months (around 03/05/2024) for fasting labs prior.   Total time spent: 25  minutes  [employer], NP  11/03/2023   This document may have been prepared by Dragon Voice Recognition software and as such may include unintentional dictation errors.

## 2023-11-06 ENCOUNTER — Encounter: Payer: Self-pay | Admitting: Cardiology

## 2023-11-07 ENCOUNTER — Other Ambulatory Visit: Payer: Self-pay | Admitting: Cardiology

## 2023-11-07 ENCOUNTER — Other Ambulatory Visit: Payer: Self-pay

## 2023-11-07 DIAGNOSIS — F900 Attention-deficit hyperactivity disorder, predominantly inattentive type: Secondary | ICD-10-CM

## 2023-11-08 ENCOUNTER — Other Ambulatory Visit

## 2023-11-08 DIAGNOSIS — R233 Spontaneous ecchymoses: Secondary | ICD-10-CM

## 2023-11-08 DIAGNOSIS — Z1329 Encounter for screening for other suspected endocrine disorder: Secondary | ICD-10-CM

## 2023-11-08 DIAGNOSIS — R7303 Prediabetes: Secondary | ICD-10-CM

## 2023-11-08 DIAGNOSIS — I10 Essential (primary) hypertension: Secondary | ICD-10-CM

## 2023-11-08 DIAGNOSIS — E782 Mixed hyperlipidemia: Secondary | ICD-10-CM

## 2023-11-08 MED ORDER — AMPHETAMINE-DEXTROAMPHETAMINE 20 MG PO TABS
20.0000 mg | ORAL_TABLET | Freq: Two times a day (BID) | ORAL | 0 refills | Status: DC
Start: 2023-11-08 — End: 2023-11-24

## 2023-11-09 ENCOUNTER — Ambulatory Visit: Payer: Self-pay | Admitting: Cardiology

## 2023-11-09 LAB — CMP14+EGFR
ALT: 18 IU/L (ref 0–32)
AST: 17 IU/L (ref 0–40)
Albumin: 4.2 g/dL (ref 3.8–4.9)
Alkaline Phosphatase: 170 IU/L — ABNORMAL HIGH (ref 44–121)
BUN/Creatinine Ratio: 20 (ref 9–23)
BUN: 15 mg/dL (ref 6–24)
Bilirubin Total: 0.3 mg/dL (ref 0.0–1.2)
CO2: 21 mmol/L (ref 20–29)
Calcium: 9.5 mg/dL (ref 8.7–10.2)
Chloride: 107 mmol/L — ABNORMAL HIGH (ref 96–106)
Creatinine, Ser: 0.75 mg/dL (ref 0.57–1.00)
Globulin, Total: 2.3 g/dL (ref 1.5–4.5)
Glucose: 99 mg/dL (ref 70–99)
Potassium: 4.3 mmol/L (ref 3.5–5.2)
Sodium: 143 mmol/L (ref 134–144)
Total Protein: 6.5 g/dL (ref 6.0–8.5)
eGFR: 95 mL/min/1.73 (ref >59)

## 2023-11-10 LAB — CBC WITH DIFFERENTIAL/PLATELET
Basophils Absolute: 0 x10E3/uL (ref 0.0–0.2)
Basos: 1 %
EOS (ABSOLUTE): 0.2 x10E3/uL (ref 0.0–0.4)
Eos: 4 %
Hematocrit: 39.7 % (ref 34.0–46.6)
Hemoglobin: 13 g/dL (ref 11.1–15.9)
Immature Grans (Abs): 0 x10E3/uL (ref 0.0–0.1)
Immature Granulocytes: 0 %
Lymphocytes Absolute: 2.7 x10E3/uL (ref 0.7–3.1)
Lymphs: 50 %
MCH: 30.7 pg (ref 26.6–33.0)
MCHC: 32.7 g/dL (ref 31.5–35.7)
MCV: 94 fL (ref 79–97)
Monocytes Absolute: 0.5 x10E3/uL (ref 0.1–0.9)
Monocytes: 9 %
Neutrophils Absolute: 1.9 x10E3/uL (ref 1.4–7.0)
Neutrophils: 36 %
Platelets: 268 x10E3/uL (ref 150–450)
RBC: 4.24 x10E6/uL (ref 3.77–5.28)
RDW: 14.3 % (ref 11.7–15.4)
WBC: 5.3 x10E3/uL (ref 3.4–10.8)

## 2023-11-10 LAB — LIPID PANEL
Chol/HDL Ratio: 2.6 ratio (ref 0.0–4.4)
Cholesterol, Total: 123 mg/dL (ref 100–199)
HDL: 48 mg/dL (ref >39)
LDL Chol Calc (NIH): 48 mg/dL (ref 0–99)
Triglycerides: 160 mg/dL — ABNORMAL HIGH (ref 0–149)
VLDL Cholesterol Cal: 27 mg/dL (ref 5–40)

## 2023-11-10 LAB — VON WILLEBRAND PANEL
Factor VIII Activity: 108 % (ref 56–140)
Von Willebrand Ag: 76 % (ref 50–200)
Von Willebrand Factor: 83 % (ref 50–200)

## 2023-11-10 LAB — HEMOGLOBIN A1C
Est. average glucose Bld gHb Est-mCnc: 114 mg/dL
Hgb A1c MFr Bld: 5.6 % (ref 4.8–5.6)

## 2023-11-10 LAB — TSH: TSH: 1.88 u[IU]/mL (ref 0.450–4.500)

## 2023-11-10 LAB — COAG STUDIES INTERP REPORT

## 2023-11-11 ENCOUNTER — Ambulatory Visit: Admitting: Nurse Practitioner

## 2023-11-11 NOTE — Progress Notes (Signed)
 Pt states she was fasting but pt informed.

## 2023-11-24 ENCOUNTER — Encounter: Payer: Self-pay | Admitting: Nurse Practitioner

## 2023-11-24 ENCOUNTER — Ambulatory Visit: Admitting: Nurse Practitioner

## 2023-11-24 VITALS — BP 123/72 | HR 85 | Ht 66.0 in | Wt 298.6 lb

## 2023-11-24 DIAGNOSIS — F900 Attention-deficit hyperactivity disorder, predominantly inattentive type: Secondary | ICD-10-CM

## 2023-11-24 DIAGNOSIS — F419 Anxiety disorder, unspecified: Secondary | ICD-10-CM | POA: Diagnosis not present

## 2023-11-24 DIAGNOSIS — F902 Attention-deficit hyperactivity disorder, combined type: Secondary | ICD-10-CM | POA: Diagnosis not present

## 2023-11-24 DIAGNOSIS — I10 Essential (primary) hypertension: Secondary | ICD-10-CM

## 2023-11-24 DIAGNOSIS — F331 Major depressive disorder, recurrent, moderate: Secondary | ICD-10-CM

## 2023-11-24 MED ORDER — AMPHETAMINE-DEXTROAMPHETAMINE 20 MG PO TABS: 20.0000 mg | ORAL_TABLET | Freq: Two times a day (BID) | ORAL | 0 refills | Status: DC

## 2023-11-24 MED ORDER — AMLODIPINE BESYLATE 5 MG PO TABS
5.0000 mg | ORAL_TABLET | Freq: Every day | ORAL | 11 refills | Status: AC
Start: 2023-11-24 — End: 2024-11-23

## 2023-11-24 MED ORDER — DOXEPIN HCL 50 MG PO CAPS: 50.0000 mg | ORAL_CAPSULE | Freq: Every day | ORAL | 3 refills | Status: DC

## 2023-11-24 MED ORDER — ALPRAZOLAM 1 MG PO TBDP: 1.0000 mg | ORAL_TABLET | Freq: Three times a day (TID) | ORAL | 2 refills | Status: DC | PRN

## 2023-11-24 NOTE — Progress Notes (Addendum)
 New Patient Office Visit  Subjective    Patient ID: Anna Keller, female    DOB: December 26, 1970  Age: 53 y.o. MRN: [id number]  CC:  Chief Complaint  Patient presents with   Establish Care    HPI Anna Keller presents to establish care Patient here today as a new patient, last right shoulder surgery with [facility] (Nov 2024).  Patient needs refills, going to increase doxepin  from 25 mg to 50 mg nightly.  Pt sees pain management.  Patient reports not sleeping due to the pain not being controlled.  Recent fasting labs from 2 weeks ago.  Outpatient Encounter Medications as of 11/24/2023  Medication Sig   Abatacept  (ORENCIA  IV) Inject 1 Units into the vein every 30 (thirty) days.   amLODipine  (NORVASC ) 5 MG tablet TAKE 1 TABLET (5 MG TOTAL) BY MOUTH DAILY.   amphetamine -dextroamphetamine  (ADDERALL) 20 MG tablet Take 1 tablet (20 mg total) by mouth 2 (two) times daily.   clobetasol  ointment (TEMOVATE ) 0.05 % Apply topically 2 (two) times daily.   doxepin  (SINEQUAN ) 25 MG capsule TAKE 1 CAPSULE BY MOUTH EVERYDAY AT BEDTIME   FLUoxetine  (PROZAC ) 40 MG capsule Take 1 capsule (40 mg total) by mouth 2 (two) times daily.   levocetirizine (XYZAL ) 5 MG tablet Take 5 mg by mouth every evening.   Multiple Vitamins-Minerals (MULTIVITAMIN WITH MINERALS) tablet Take 1 tablet by mouth daily.   polyethylene glycol powder (GLYCOLAX /MIRALAX ) 17 GM/SCOOP powder Take 17 g by mouth daily. (Patient taking differently: Take 17 g by mouth daily as needed for mild constipation or moderate constipation.)   rosuvastatin  (CRESTOR ) 40 MG tablet Take 1 tablet (40 mg total) by mouth daily.   solifenacin  (VESICARE ) 10 MG tablet Take 1 tablet (10 mg total) by mouth daily.   tirzepatide  (MOUNJARO ) 2.5 MG/0.5ML Pen Inject 2.5 mg into the skin once a week.   venlafaxine  XR (EFFEXOR -XR) 150 MG 24 hr capsule TAKE 1 CAPSULE BY MOUTH TWICE A DAY   No facility-administered encounter medications on file as of 11/24/2023.     Past Medical History:  Diagnosis Date   ADHD (attention deficit hyperactivity disorder)    Ankylosing spondylitis (HCC)    Anxiety    Arthritis    Depression    Eczema    Family history of adverse reaction to anesthesia    dad would be crazy, talk out of his head-per pt   Headache    Rheumatoid arthritis (HCC)     Past Surgical History:  Procedure Laterality Date   BLADDER SUSPENSION N/A 04/14/2021   Procedure: TRANSVAGINAL TAPE (TVT) PROCEDURE;  Surgeon: Bettina Muskrat, MD;  Location: [facility];  Service: Gynecology;  Laterality: N/A;  GYNCARE TVT EXACT   CYSTOSCOPY N/A 04/14/2021   Procedure: CYSTOSCOPY;  Surgeon: Bettina Muskrat, MD;  Location: [facility];  Service: Gynecology;  Laterality: N/A;   KNEE ARTHROSCOPY Left 1988   REVERSE SHOULDER ARTHROPLASTY Right 01/05/2022   Procedure: RIGHT SHOULDER REVERSE SHOULDER ARTHROPLASTY;  Surgeon: Genelle Standing, MD;  Location: [facility];  Service: Orthopedics;  Laterality: Right;   REVERSE SHOULDER ARTHROPLASTY Right 04/01/2022   Procedure: RIGHT REVERSE SHOULDER ARTHROPLASTY REVISION;  Surgeon: Genelle Standing, MD;  [facility]: [facility];  Service: Orthopedics;  Laterality: Right;   REVERSE SHOULDER ARTHROPLASTY Right 07/22/2022   Procedure: RIGHT SHOULDER REVISION  ARTHROPLASTY;  Surgeon: Genelle Standing, MD;  [facility]: [facility];  Service: Orthopedics;  Laterality: Right;   TONSILLECTOMY     as a child   TOTAL LAPAROSCOPIC HYSTERECTOMY WITH  SALPINGECTOMY Bilateral 04/14/2021   Procedure: TOTAL LAPAROSCOPIC HYSTERECTOMY WITH BILATERAL SALPINGECTOMY AND BILATERAL OOPHERECTOMY;  Surgeon: Bettina Muskrat, MD;  Location: [facility];  Service: Gynecology;  Laterality: Bilateral;    Family History  Problem Relation Age of Onset   Hypertension Mother    Arthritis Father    Heart disease Sister    Pulmonary Hypertension Sister    COPD Sister    Diabetes Sister    Breast cancer Maternal Grandmother    Heart attack Paternal Grandmother    Breast cancer Maternal Aunt     Breast cancer Cousin     Social History   Socioeconomic History   Marital status: Married    Spouse name: Ozell   Number of children: 1   Years of education: 12   Highest education level: 12th grade  Occupational History   Not on file  Tobacco Use   Smoking status: Former    Current packs/day: 0.00    Average packs/day: 1 pack/day for 25.0 years (25.0 ttl pk-yrs)    Types: Cigarettes    Start date: 01/09/1991    Quit date: 01/09/2016    Years since quitting: 7.8   Smokeless tobacco: Never  Vaping Use   Vaping status: Never Used  Substance and Sexual Activity   Alcohol use: Not Currently   Drug use: No   Sexual activity: Yes    Partners: Male    Birth control/protection: None  Other Topics Concern   Not on file  Social History Narrative   Not on file   Social Drivers of Health   Financial Resource Strain: High Risk (11/07/2023)   Overall Financial Resource Strain (CARDIA)    Difficulty of Paying Living Expenses: Hard  Food Insecurity: No Food Insecurity (11/07/2023)   Hunger Vital Sign    Worried About Running Out of Food in the Last Year: Never true    Ran Out of Food in the Last Year: Never true  Transportation Needs: No Transportation Needs (11/07/2023)   PRAPARE - Administrator, Civil Service (Medical): No    Lack of Transportation (Non-Medical): No  Physical Activity: Inactive (11/07/2023)   Exercise Vital Sign    Days of Exercise per Week: 0 days    Minutes of Exercise per Session: Not on file  Stress: Stress Concern Present (11/07/2023)   [employer] of Occupational Health - Occupational Stress Questionnaire    Feeling of Stress: Very much  Social Connections: Socially Isolated (11/07/2023)   Social Connection and Isolation Panel    Frequency of Communication with Friends and Family: More than three times a week    Frequency of Social Gatherings with Friends and Family: Never    Attends Religious Services: Never    Database administrator or  Organizations: No    Attends Engineer, structural: Not on file    Marital Status: Separated  Intimate Partner Violence: Not At Risk (04/02/2022)   Humiliation, Afraid, Rape, and Kick questionnaire    Fear of Current or Ex-Partner: No    Emotionally Abused: No    Physically Abused: No    Sexually Abused: No    ROS      Objective    BP 123/72   Pulse 85   Ht 5' 6 (1.676 m)   Wt 298 lb 9.6 oz (135.4 kg)   LMP 03/24/2021   SpO2 98%   BMI 48.20 kg/m   Physical Exam Vitals and nursing note reviewed.  Constitutional:  Appearance: Normal appearance.  HENT:     Head: Normocephalic.     Nose: Nose normal.     Mouth/Throat:     Mouth: Mucous membranes are moist.  Eyes:     Extraocular Movements: Extraocular movements intact.     Pupils: Pupils are equal, round, and reactive to light.  Cardiovascular:     Rate and Rhythm: Normal rate and regular rhythm.     Pulses: Normal pulses.     Heart sounds: Normal heart sounds.  Pulmonary:     Effort: Pulmonary effort is normal.     Breath sounds: Normal breath sounds.  Abdominal:     General: Bowel sounds are normal.     Palpations: Abdomen is soft.  Musculoskeletal:        General: Tenderness present.     Cervical back: Normal range of motion and neck supple.  Neurological:     Mental Status: She is alert.         Assessment & Plan: HTN Anxiety MDD ADHD  1) Wean off of fluoxetine  2) Vraylar  1.5 daily to replace fluoxetine  3) Increase doxepin  50 mg nightly instead of 25 mg 4) Follow up appt in 2 weeks   Problem List Items Addressed This Visit   None   No follow-ups on file.   Neale Carpen, NP

## 2023-11-24 NOTE — Patient Instructions (Signed)
 1) Wean off of fluoxetine  2) Vraylar 1.5 daily to replace fluoxetine  3) Increase doxepin  50 mg nightly instead of 25 mg 4) Follow up appt in 2 weeks

## 2023-11-28 ENCOUNTER — Encounter: Payer: Self-pay | Admitting: Nurse Practitioner

## 2023-11-30 ENCOUNTER — Other Ambulatory Visit: Payer: Self-pay

## 2023-11-30 DIAGNOSIS — F419 Anxiety disorder, unspecified: Secondary | ICD-10-CM

## 2023-11-30 MED ORDER — ALPRAZOLAM 1 MG PO TABS: 1.0000 mg | ORAL_TABLET | Freq: Two times a day (BID) | ORAL | 0 refills | Status: DC | PRN

## 2023-12-01 ENCOUNTER — Telehealth: Payer: Self-pay

## 2023-12-01 NOTE — Telephone Encounter (Signed)
 Copied from CRM [id number]. Topic: Appointments - Appointment Cancel/Reschedule >> Dec 01, 2023  9:57 AM Cleave MATSU wrote: Patient/patient representative is calling to cancel or reschedule an appointment. Refer to attachments for appointment information. Please call back and reschedule pt appt >> Dec 01, 2023 11:24 AM Emylou G wrote: Rescheduled appt for patient Anna Keller isn't available.. She rcvd msg.. I picked the sooner for Dr Antonetta.. making sure that was okay.

## 2023-12-01 NOTE — Telephone Encounter (Signed)
 Copied from CRM [id number]. Topic: Appointments - Appointment Cancel/Reschedule >> Dec 01, 2023  9:57 AM Cleave MATSU wrote: Patient/patient representative is calling to cancel or reschedule an appointment. Refer to attachments for appointment information. Please call back and reschedule pt appt

## 2023-12-01 NOTE — Telephone Encounter (Signed)
 Patient will need to be rescheduled Dr Antonetta is not taking new patients.

## 2023-12-01 NOTE — Telephone Encounter (Signed)
 Lvm for patient to call back in reference to rescheduling 12/08/23 appointment with [facility]

## 2023-12-08 ENCOUNTER — Other Ambulatory Visit: Payer: Self-pay | Admitting: Family Medicine

## 2023-12-08 ENCOUNTER — Ambulatory Visit (INDEPENDENT_AMBULATORY_CARE_PROVIDER_SITE_OTHER): Admitting: Family Medicine

## 2023-12-08 ENCOUNTER — Ambulatory Visit: Admitting: Nurse Practitioner

## 2023-12-08 ENCOUNTER — Encounter: Payer: Self-pay | Admitting: Family Medicine

## 2023-12-08 VITALS — BP 128/81 | HR 98 | Ht 66.0 in | Wt 301.0 lb

## 2023-12-08 DIAGNOSIS — F331 Major depressive disorder, recurrent, moderate: Secondary | ICD-10-CM | POA: Diagnosis not present

## 2023-12-08 DIAGNOSIS — F900 Attention-deficit hyperactivity disorder, predominantly inattentive type: Secondary | ICD-10-CM

## 2023-12-08 MED ORDER — AMPHETAMINE-DEXTROAMPHETAMINE 20 MG PO TABS: 20.0000 mg | ORAL_TABLET | Freq: Two times a day (BID) | ORAL | 0 refills | Status: DC

## 2023-12-08 MED ORDER — CARIPRAZINE HCL 1.5 MG PO CAPS
1.5000 mg | ORAL_CAPSULE | Freq: Every day | ORAL | 1 refills | Status: DC
Start: 2023-12-08 — End: 2024-01-26

## 2023-12-08 NOTE — Patient Instructions (Addendum)
 I appreciate the opportunity to provide care to you today!    Follow up:  1 months with PCP  Depression  Taper off Prozac  while initiating Vraylar  1.5 mg daily as follows:  Prozac  Tapering Schedule:  Day 1: Take 1 tablet  Day 2: Take 1 tablet every other day  Day 3: Take 1 tablet 2 days after the last dose  Day 4: Take 1 tablet 3 days after the last dose  Day 5: Take 1 tablet 4 days after the last dose  Day 6: Take 1 tablet 5 days after the last dose  Day 7: Take 1 tablet 6 days after the last dose  Day 8: Take 1 tablet 7 days after the last dose, then discontinue.  Start Vraylar  1.5 mg once daily immediately, with ongoing monitoring for mood, side effects, or any signs of medication intolerance.  Follow-up to reassess mood symptoms and medication response in 4 weeks or sooner if concerns arise.   Please follow up if your symptoms worsen or fail to improve.    Please continue to a heart-healthy diet and increase your physical activities. Try to exercise for at least five days a week.    It was a pleasure to see you and I look forward to continuing to work together on your health and well-being. Please do not hesitate to call the office if you need care or have questions about your care.  In case of emergency, please visit the Emergency Department for urgent care, or contact our clinic at [phone] to schedule an appointment. We're here to help you!   Have a wonderful day and week. With Gratitude, Dione Petron MSN, FNP-BC

## 2023-12-08 NOTE — Progress Notes (Signed)
 Established Patient Office Visit  Subjective:  Patient ID: Anna Keller, female    DOB: 03-Jun-1970  Age: 53 y.o. MRN: [id number]  CC:  Chief Complaint  Patient presents with   Medical Management of Chronic Issues    2 week follow up    HPI Anna Keller is a 53 y.o. female presents for follow-up and medication refills with a past medical history of depression. She reports that she has not yet begun tapering off Prozac  as previously discussed. She is requesting refills for Adderall and Vraylar .    Past Medical History:  Diagnosis Date   ADHD (attention deficit hyperactivity disorder)    Ankylosing spondylitis (HCC)    Anxiety    Arthritis    Depression    Eczema    Family history of adverse reaction to anesthesia    Anna Keller would be crazy, talk out of his head-per pt   Headache    Rheumatoid arthritis (HCC)     Past Surgical History:  Procedure Laterality Date   BLADDER SUSPENSION N/A 04/14/2021   Procedure: TRANSVAGINAL TAPE (TVT) PROCEDURE;  Surgeon: Bettina Muskrat, MD;  Location: [facility];  Service: Gynecology;  Laterality: N/A;  GYNCARE TVT EXACT   CYSTOSCOPY N/A 04/14/2021   Procedure: CYSTOSCOPY;  Surgeon: Bettina Muskrat, MD;  Location: [facility];  Service: Gynecology;  Laterality: N/A;   KNEE ARTHROSCOPY Left 1988   REVERSE SHOULDER ARTHROPLASTY Right 01/05/2022   Procedure: RIGHT SHOULDER REVERSE SHOULDER ARTHROPLASTY;  Surgeon: Genelle Standing, MD;  Location: [facility];  Service: Orthopedics;  Laterality: Right;   REVERSE SHOULDER ARTHROPLASTY Right 04/01/2022   Procedure: RIGHT REVERSE SHOULDER ARTHROPLASTY REVISION;  Surgeon: Genelle Standing, MD;  Location: [facility];  Service: Orthopedics;  Laterality: Right;   REVERSE SHOULDER ARTHROPLASTY Right 07/22/2022   Procedure: RIGHT SHOULDER REVISION  ARTHROPLASTY;  Surgeon: Genelle Standing, MD;  Location: [facility];  Service: Orthopedics;  Laterality: Right;   TONSILLECTOMY     as a child   TOTAL LAPAROSCOPIC HYSTERECTOMY WITH  SALPINGECTOMY Bilateral 04/14/2021   Procedure: TOTAL LAPAROSCOPIC HYSTERECTOMY WITH BILATERAL SALPINGECTOMY AND BILATERAL OOPHERECTOMY;  Surgeon: Bettina Muskrat, MD;  Location: [facility];  Service: Gynecology;  Laterality: Bilateral;    Family History  Problem Relation Age of Onset   Hypertension Mother    Arthritis Father    Heart disease Sister    Pulmonary Hypertension Sister    COPD Sister    Diabetes Sister    Breast cancer Maternal Grandmother    Heart attack Paternal Grandmother    Breast cancer Maternal Aunt    Breast cancer Cousin     Social History   Socioeconomic History   Marital status: Married    Spouse name: Ozell   Number of children: 1   Years of education: 12   Highest education level: 12th grade  Occupational History   Not on file  Tobacco Use   Smoking status: Former    Current packs/day: 0.00    Average packs/day: 1 pack/day for 25.0 years (25.0 ttl pk-yrs)    Types: Cigarettes    Start date: 01/09/1991    Quit date: 01/09/2016    Years since quitting: 7.9   Smokeless tobacco: Never  Vaping Use   Vaping status: Never Used  Substance and Sexual Activity   Alcohol use: Not Currently   Drug use: No   Sexual activity: Yes    Partners: Male    Birth control/protection: None  Other Topics Concern   Not on file  Social  History Narrative   Not on file   Social Drivers of Health   Financial Resource Strain: High Risk (11/07/2023)   Overall Financial Resource Strain (CARDIA)    Difficulty of Paying Living Expenses: Hard  Food Insecurity: No Food Insecurity (11/07/2023)   Hunger Vital Sign    Worried About Running Out of Food in the Last Year: Never true    Ran Out of Food in the Last Year: Never true  Transportation Needs: No Transportation Needs (11/07/2023)   PRAPARE - Administrator, Civil Service (Medical): No    Lack of Transportation (Non-Medical): No  Physical Activity: Inactive (11/07/2023)   Exercise Vital Sign    Days of Exercise per  Week: 0 days    Minutes of Exercise per Session: Not on file  Stress: Stress Concern Present (11/07/2023)   [employer] of Occupational Health - Occupational Stress Questionnaire    Feeling of Stress: Very much  Social Connections: Socially Isolated (11/07/2023)   Social Connection and Isolation Panel    Frequency of Communication with Friends and Family: More than three times a week    Frequency of Social Gatherings with Friends and Family: Never    Attends Religious Services: Never    Database administrator or Organizations: No    Attends Engineer, structural: Not on file    Marital Status: Separated  Intimate Partner Violence: Not At Risk (04/02/2022)   Humiliation, Afraid, Rape, and Kick questionnaire    Fear of Current or Ex-Partner: No    Emotionally Abused: No    Physically Abused: No    Sexually Abused: No    Outpatient Medications Prior to Visit  Medication Sig Dispense Refill   Abatacept  (ORENCIA  IV) Inject 1 Units into the vein every 30 (thirty) days.     ALPRAZolam  (XANAX ) 1 MG tablet Take 1 tablet (1 mg total) by mouth 2 (two) times daily as needed for anxiety. 30 tablet 0   amLODipine  (NORVASC ) 5 MG tablet Take 1 tablet (5 mg total) by mouth daily. 30 tablet 11   clobetasol  ointment (TEMOVATE ) 0.05 % Apply topically 2 (two) times daily. 30 g 1   doxepin  (SINEQUAN ) 50 MG capsule Take 1 capsule (50 mg total) by mouth at bedtime. 30 capsule 3   levocetirizine (XYZAL ) 5 MG tablet Take 5 mg by mouth every evening.     Multiple Vitamins-Minerals (MULTIVITAMIN WITH MINERALS) tablet Take 1 tablet by mouth daily.     polyethylene glycol powder (GLYCOLAX /MIRALAX ) 17 GM/SCOOP powder Take 17 g by mouth daily. (Patient taking differently: Take 17 g by mouth daily as needed for mild constipation or moderate constipation.) 3350 g 3   rosuvastatin  (CRESTOR ) 40 MG tablet Take 1 tablet (40 mg total) by mouth daily. 90 tablet 3   solifenacin  (VESICARE ) 10 MG tablet Take 1 tablet  (10 mg total) by mouth daily. 30 tablet 5   amphetamine -dextroamphetamine  (ADDERALL) 20 MG tablet Take 1 tablet (20 mg total) by mouth 2 (two) times daily. 60 tablet 0   No facility-administered medications prior to visit.    Allergies  Allergen Reactions   Codeine Nausea And Vomiting    ROS Review of Systems  Constitutional:  Negative for chills and fever.  Eyes:  Negative for visual disturbance.  Respiratory:  Negative for chest tightness and shortness of breath.   Neurological:  Negative for dizziness and headaches.      Objective:    Physical Exam HENT:     Head: Normocephalic.  Mouth/Throat:     Mouth: Mucous membranes are moist.  Cardiovascular:     Rate and Rhythm: Normal rate.     Heart sounds: Normal heart sounds.  Pulmonary:     Effort: Pulmonary effort is normal.     Breath sounds: Normal breath sounds.  Neurological:     Mental Status: She is alert.     BP 128/81   Pulse 98   Ht 5' 6 (1.676 m)   Wt (!) 301 lb (136.5 kg)   LMP 03/24/2021   SpO2 94%   BMI 48.58 kg/m  Wt Readings from Last 3 Encounters:  12/08/23 (!) 301 lb (136.5 kg)  11/24/23 298 lb 9.6 oz (135.4 kg)  11/03/23 295 lb 3.2 oz (133.9 kg)    Lab Results  Component Value Date   TSH 1.880 11/08/2023   Lab Results  Component Value Date   WBC 5.3 11/08/2023   HGB 13.0 11/08/2023   HCT 39.7 11/08/2023   MCV 94 11/08/2023   PLT 268 11/08/2023   Lab Results  Component Value Date   NA 143 11/08/2023   K 4.3 11/08/2023   CO2 21 11/08/2023   GLUCOSE 99 11/08/2023   BUN 15 11/08/2023   CREATININE 0.75 11/08/2023   BILITOT 0.3 11/08/2023   ALKPHOS 170 (H) 11/08/2023   AST 17 11/08/2023   ALT 18 11/08/2023   PROT 6.5 11/08/2023   ALBUMIN  4.2 11/08/2023   CALCIUM  9.5 11/08/2023   ANIONGAP 11 04/02/2022   EGFR 95 11/08/2023   Lab Results  Component Value Date   CHOL 123 11/08/2023   Lab Results  Component Value Date   HDL 48 11/08/2023   Lab Results  Component  Value Date   LDLCALC 48 11/08/2023   Lab Results  Component Value Date   TRIG 160 (H) 11/08/2023   Lab Results  Component Value Date   CHOLHDL 2.6 11/08/2023   Lab Results  Component Value Date   HGBA1C 5.6 11/08/2023      Assessment & Plan:  Moderate episode of recurrent major depressive disorder (HCC) Assessment & Plan: Denies SI/HI and AVH Refilled Vraylar  Encouraged to continue Prozac  as discussed  Orders: -     Cariprazine  HCl; Take 1 capsule (1.5 mg total) by mouth daily.  Dispense: 30 capsule; Refill: 1  Attention deficit hyperactivity disorder (ADHD), predominantly inattentive type Assessment & Plan: Refilled adderall Continue treatment regimen as is   Orders: -     Amphetamine -Dextroamphetamine ; Take 1 tablet (20 mg total) by mouth 2 (two) times daily.  Dispense: 60 tablet; Refill: 0  Note: This chart has been completed using Engineer, civil (consulting) software, and while attempts have been made to ensure accuracy, certain words and phrases may not be transcribed as intended.    Follow-up: Return in about 1 month (around 01/08/2024).   Paula Zietz, FNP

## 2023-12-08 NOTE — Assessment & Plan Note (Signed)
 Denies SI/HI and AVH Refilled Vraylar  Encouraged to continue Prozac  as discussed

## 2023-12-08 NOTE — Assessment & Plan Note (Addendum)
 Refilled adderall Continue treatment regimen as is

## 2023-12-14 ENCOUNTER — Ambulatory Visit: Admitting: Nurse Practitioner

## 2023-12-14 ENCOUNTER — Ambulatory Visit: Admitting: Family Medicine

## 2023-12-16 ENCOUNTER — Encounter: Payer: Self-pay | Admitting: Nurse Practitioner

## 2023-12-16 ENCOUNTER — Other Ambulatory Visit: Payer: Self-pay | Admitting: Nurse Practitioner

## 2023-12-16 DIAGNOSIS — F419 Anxiety disorder, unspecified: Secondary | ICD-10-CM

## 2023-12-16 MED ORDER — ALPRAZOLAM 1 MG PO TABS: 1.0000 mg | ORAL_TABLET | Freq: Three times a day (TID) | ORAL | 2 refills | Status: AC | PRN

## 2023-12-21 ENCOUNTER — Encounter: Payer: Self-pay | Admitting: Orthopaedic Surgery

## 2023-12-29 ENCOUNTER — Ambulatory Visit: Admitting: Nurse Practitioner

## 2024-01-04 ENCOUNTER — Encounter: Payer: Self-pay | Admitting: Nurse Practitioner

## 2024-01-05 ENCOUNTER — Other Ambulatory Visit: Payer: Self-pay | Admitting: Nurse Practitioner

## 2024-01-05 ENCOUNTER — Ambulatory Visit: Admitting: Physician Assistant

## 2024-01-05 ENCOUNTER — Ambulatory Visit: Admitting: Nurse Practitioner

## 2024-01-05 DIAGNOSIS — F900 Attention-deficit hyperactivity disorder, predominantly inattentive type: Secondary | ICD-10-CM

## 2024-01-05 MED ORDER — AMPHETAMINE-DEXTROAMPHETAMINE 20 MG PO TABS: 20.0000 mg | ORAL_TABLET | Freq: Two times a day (BID) | ORAL | 0 refills | Status: DC

## 2024-01-11 ENCOUNTER — Ambulatory Visit: Admitting: Physician Assistant

## 2024-01-11 ENCOUNTER — Other Ambulatory Visit (INDEPENDENT_AMBULATORY_CARE_PROVIDER_SITE_OTHER): Payer: Self-pay

## 2024-01-11 ENCOUNTER — Encounter: Payer: Self-pay | Admitting: Physician Assistant

## 2024-01-11 ENCOUNTER — Other Ambulatory Visit (INDEPENDENT_AMBULATORY_CARE_PROVIDER_SITE_OTHER)

## 2024-01-11 VITALS — Ht 65.35 in | Wt 306.8 lb

## 2024-01-11 DIAGNOSIS — M1711 Unilateral primary osteoarthritis, right knee: Secondary | ICD-10-CM | POA: Diagnosis not present

## 2024-01-11 DIAGNOSIS — M1712 Unilateral primary osteoarthritis, left knee: Secondary | ICD-10-CM | POA: Diagnosis not present

## 2024-01-11 NOTE — Progress Notes (Signed)
 Office Visit Note   Patient: Anna Keller           Date of Birth: 16-Nov-1970           MRN: [id number] Visit Date: 01/11/2024              Requested by: Glennon Sand, NP [address] [place],  [place] [address] PCP: Glennon Sand, NP   Assessment & Plan: Visit Diagnoses:  1. Primary osteoarthritis of left knee   2. Primary osteoarthritis of right knee     Plan: Patient has failed conservative treatment which is included medications, cortisone and viscosupplementation injections of the knees.  She is not a surgical candidate given her BMI of 50.5.  She is unable to exercise due to the knee pain in order to undergo knee replacement.  At this point in time recommend left knee geniculate ablation to see if this helps in regards to the pain that she is having in the left knee.  She will follow-up with us  as needed.  Will be in touch with her in regards to obtaining approval for the left knee geniculate ablation.  Recommend patient continue work on quad strengthening both knees.  Otherwise I have nothing else to offer her in regards to her knees if we cannot gain approval for genicular ablation of left knee.  Follow-Up Instructions: Return if symptoms worsen or fail to improve.   Orders:  Orders Placed This Encounter  Procedures   XR Knee 1-2 Views Right   XR Knee 1-2 Views Left   No orders of the defined types were placed in this encounter.     Procedures: No procedures performed   Clinical Data: No additional findings.   Subjective: Chief Complaint  Patient presents with   Right Knee - Pain   Left Knee - Pain    HPI Patient is a 53 year old female who comes in today due to bilateral knee pain left greater than right.  She states her left knee pain is becoming worse.  She has had no new injuries to either knee.  She is having trouble with the left knee giving way and popping grinding.  She has difficulty using a cane because of her failed  right shoulder arthroplasty.  She also has severe pain in multiple joints due to her RA.  She is in pain management at this point in time.  States she is unable to exercise to lose weight due to the left knee pain.  She does use a walker at times to ambulate but again has difficulty due to her decreased range of motion status post 4 surgeries on the right shoulder.  Review of Systems   Objective: Vital Signs: Ht 5' 5.35 (1.66 m)   Wt (!) 306 lb 12.8 oz (139.2 kg)   LMP 03/24/2021   BMI 50.50 kg/m   Physical Exam Pulmonary:     Effort: Pulmonary effort is normal.  Neurological:     Mental Status: She is alert.  Psychiatric:        Mood and Affect: Mood normal.     Ortho Exam Bilateral knees no abnormal warmth erythema.  Left knee she has global tenderness.  She lacks about 5 degrees in full extension only flexes to 90 degrees.  Patellofemoral crepitus.  Right knee good range of motion..  Right knee no instability valgus varus stressing.  Unable to stress left knee secondary to pain. Specialty Comments:  No specialty comments available.  Imaging: XR Knee  1-2 Views Left Result Date: 01/11/2024 Left knee 2 views: No acute fracture knee is well located.  Bone-on-bone medial compartment.  Patellofemoral arthritic severely periarticular spurring.  Lateral compartment with periarticular spurring mild arthritic changes.  Compared paired to prior films her patellofemoral and medial compartment arthritis has progressed.  XR Knee 1-2 Views Right Result Date: 01/11/2024 Right knee 2 views: Shows mild patellofemoral arthritis and moderate narrowing medial joint line lateral joint line overall well-preserved.  Knee is well located.  No acute fractures acute findings no other bony abnormalities.    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) 05/05/2022   History of reverse total replacement of right shoulder joint 04/01/2022   Unilateral primary osteoarthritis, left knee 01/27/2022   Traumatic complete tear of right rotator cuff    S/P laparoscopic hysterectomy 04/14/2021   Grief reaction 03/23/2019   Gastroesophageal reflux disease without esophagitis 03/23/2019   Constipation 03/23/2019   Eczema 03/23/2019   Pain in joint, multiple sites 07/14/2018   Family history of arthritis 07/14/2018   Peeling skin 07/14/2018   Class 3 severe obesity due to excess calories with body mass index (BMI) of 40.0 to 44.9 in adult 07/14/2018   Anxiety 07/14/2018   Depression 07/14/2018   Past Medical History:  Diagnosis Date   ADHD (attention deficit hyperactivity disorder)    Ankylosing spondylitis (HCC)    Anxiety    Arthritis    Depression    Eczema    Family history of adverse reaction to anesthesia    dad would be crazy, talk out of his head-per pt   Headache    Rheumatoid arthritis (HCC)     Family History  Problem Relation Age of Onset   Hypertension Mother    Arthritis Father    Heart disease Sister    Pulmonary Hypertension Sister    COPD Sister    Diabetes Sister    Breast cancer Maternal Grandmother    Heart attack Paternal Grandmother    Breast cancer Maternal Aunt    Breast cancer Cousin     Past Surgical History:  Procedure Laterality Date   BLADDER SUSPENSION N/A 04/14/2021   Procedure: TRANSVAGINAL TAPE (TVT) PROCEDURE;  Surgeon: Bettina Muskrat, MD;  Location: [facility];  Service: Gynecology;   Laterality: N/A;  GYNCARE TVT EXACT   CYSTOSCOPY N/A 04/14/2021   Procedure: CYSTOSCOPY;  Surgeon: Bettina Muskrat, MD;  Location: [facility];  Service: Gynecology;  Laterality: N/A;   KNEE ARTHROSCOPY Left 1988   REVERSE SHOULDER ARTHROPLASTY Right 01/05/2022   Procedure: RIGHT SHOULDER REVERSE SHOULDER ARTHROPLASTY;  Surgeon: Genelle Standing, MD;  Location: [facility];  Service: Orthopedics;  Laterality: Right;   REVERSE SHOULDER ARTHROPLASTY Right 04/01/2022   Procedure: RIGHT REVERSE SHOULDER ARTHROPLASTY REVISION;  Surgeon: Genelle Standing, MD;  [facility]: [facility];  Service: Orthopedics;  Laterality: Right;   REVERSE SHOULDER ARTHROPLASTY Right 07/22/2022   Procedure: RIGHT SHOULDER REVISION  ARTHROPLASTY;  Surgeon: Genelle Standing, MD;  [facility]: [facility];  Service: Orthopedics;  Laterality: Right;  TONSILLECTOMY     as a child   TOTAL LAPAROSCOPIC HYSTERECTOMY WITH SALPINGECTOMY Bilateral 04/14/2021   Procedure: TOTAL LAPAROSCOPIC HYSTERECTOMY WITH BILATERAL SALPINGECTOMY AND BILATERAL OOPHERECTOMY;  Surgeon: Bettina Muskrat, MD;  Location: [facility];  Service: Gynecology;  Laterality: Bilateral;   Social History   Occupational History   Not on file  Tobacco Use   Smoking status: Former    Current packs/day: 0.00    Average packs/day: 1 pack/day for 25.0 years (25.0 ttl pk-yrs)    Types: Cigarettes    Start date: 01/09/1991    Quit date: 01/09/2016    Years since quitting: 8.0   Smokeless tobacco: Never  Vaping Use   Vaping status: Never Used  Substance and Sexual Activity   Alcohol use: Not Currently   Drug use: No   Sexual activity: Yes    Partners: Male    Birth control/protection: None

## 2024-01-12 ENCOUNTER — Ambulatory Visit: Admitting: Nurse Practitioner

## 2024-01-12 ENCOUNTER — Encounter: Payer: Self-pay | Admitting: Nurse Practitioner

## 2024-01-16 ENCOUNTER — Ambulatory Visit: Admitting: Nurse Practitioner

## 2024-01-16 ENCOUNTER — Ambulatory Visit: Admitting: Physician Assistant

## 2024-01-24 ENCOUNTER — Other Ambulatory Visit: Payer: Self-pay | Admitting: Radiology

## 2024-01-24 ENCOUNTER — Telehealth: Payer: Self-pay

## 2024-01-24 DIAGNOSIS — M1712 Unilateral primary osteoarthritis, left knee: Secondary | ICD-10-CM

## 2024-01-24 NOTE — Telephone Encounter (Signed)
 Copied from CRM #[id number]. Topic: Clinical - Medication Question >> Jan 24, 2024 11:07 AM Berwyn MATSU wrote: Reason for CRM: Patient called in to advise that she is taking the medication cariprazine  (VRAYLAR ) 1.5 MG capsule for almost 6-8 weeks  on this medication and she feels no change. Per patient she was supposed to come back to see NP but now NP is not coming back. Next appointment is scheduled for 05/22/24 and she feels its too far out and may need a med change or dosage change.   May you please assist.

## 2024-01-25 NOTE — Telephone Encounter (Signed)
 Patient scheduled.

## 2024-01-26 ENCOUNTER — Telehealth: Admitting: Family Medicine

## 2024-01-26 DIAGNOSIS — F331 Major depressive disorder, recurrent, moderate: Secondary | ICD-10-CM

## 2024-01-26 MED ORDER — CARIPRAZINE HCL 3 MG PO CAPS: 3.0000 mg | ORAL_CAPSULE | Freq: Every day | ORAL | 2 refills | Status: DC

## 2024-01-26 NOTE — Progress Notes (Signed)
 Virtual Visit via Video Note  I connected with [name] on 01/26/24 at  9:40 AM EDT by a video enabled telemedicine application and verified that I am speaking with the correct person using two identifiers.  Patient Location: Home Provider Location: Office/Clinic  I discussed the limitations, risks, security, and privacy concerns of performing an evaluation and management service by video and the availability of in person appointments. I also discussed with the patient that there may be a patient responsible charge related to this service. The patient expressed understanding and agreed to proceed.  Subjective: PCP: Glennon Sand, NP (Inactive)  No chief complaint on file.  HPI Patient presents via telehealth for depression medication follow up. For the details of today's visit, please refer to assessment and plan.    ROS: Per HPI  Current Outpatient Medications:    cariprazine  (VRAYLAR ) 3 MG capsule, Take 1 capsule (3 mg total) by mouth daily., Disp: 30 capsule, Rfl: 2   Abatacept  (ORENCIA  IV), Inject 1 Units into the vein every 30 (thirty) days., Disp: , Rfl:    ALPRAZolam  (XANAX ) 1 MG tablet, Take 1 tablet (1 mg total) by mouth 3 (three) times daily as needed for anxiety., Disp: 90 tablet, Rfl: 2   amLODipine  (NORVASC ) 5 MG tablet, Take 1 tablet (5 mg total) by mouth daily., Disp: 30 tablet, Rfl: 11   amphetamine -dextroamphetamine  (ADDERALL) 20 MG tablet, Take 1 tablet (20 mg total) by mouth 2 (two) times daily., Disp: 60 tablet, Rfl: 0   clobetasol  ointment (TEMOVATE ) 0.05 %, Apply topically 2 (two) times daily., Disp: 30 g, Rfl: 1   doxepin  (SINEQUAN ) 50 MG capsule, Take 1 capsule (50 mg total) by mouth at bedtime., Disp: 30 capsule, Rfl: 3   levocetirizine (XYZAL ) 5 MG tablet, Take 5 mg by mouth every evening., Disp: , Rfl:    Multiple Vitamins-Minerals (MULTIVITAMIN WITH MINERALS) tablet, Take 1 tablet by mouth daily., Disp: , Rfl:    polyethylene glycol powder  (GLYCOLAX /MIRALAX ) 17 GM/SCOOP powder, Take 17 g by mouth daily. (Patient taking differently: Take 17 g by mouth daily as needed for mild constipation or moderate constipation.), Disp: 3350 g, Rfl: 3   rosuvastatin  (CRESTOR ) 40 MG tablet, Take 1 tablet (40 mg total) by mouth daily., Disp: 90 tablet, Rfl: 3   solifenacin  (VESICARE ) 10 MG tablet, Take 1 tablet (10 mg total) by mouth daily., Disp: 30 tablet, Rfl: 5  Observations/Objective: There were no vitals filed for this visit. Physical Exam Patient is alert and no acute distress noted.   Assessment and Plan: Moderate episode of recurrent major depressive disorder [facility]) Assessment & Plan: Patient reports no noticeable improvement in mood or depressive symptoms after 8 weeks on Vraylar  (cariprazine ) 1.5 mg daily. Current medications include Effexor  (venlafaxine ) 225 mg daily and Doxepin  50 mg at bedtime for sleep. Major Depressive Disorder, partial response to current regimen Plan: Increase Vraylar  from 1.5 mg to 3 mg daily to optimize adjunctive treatment for depression. Continue Effexor  225 mg daily and Doxepin  50 mg nightly. Follow up in 4-6 weeks to assess response to dose adjustment   Other orders -     Cariprazine  HCl; Take 1 capsule (3 mg total) by mouth daily.  Dispense: 30 capsule; Refill: 2    Follow Up Instructions: No follow-ups on file.   I discussed the assessment and treatment plan with the patient. The patient was provided an opportunity to ask questions, and all were answered. The patient agreed with the plan and demonstrated an  understanding of the instructions.   The patient was advised to call back or seek an in-person evaluation if the symptoms worsen or if the condition fails to improve as anticipated.  The above assessment and management plan was discussed with the patient. The patient verbalized understanding of and has agreed to the management plan.   Anna Keller Anna Falter, FNP

## 2024-01-26 NOTE — Assessment & Plan Note (Signed)
 Patient reports no noticeable improvement in mood or depressive symptoms after 8 weeks on Vraylar  (cariprazine ) 1.5 mg daily. Current medications include Effexor  (venlafaxine ) 225 mg daily and Doxepin  50 mg at bedtime for sleep. Major Depressive Disorder, partial response to current regimen Plan: Increase Vraylar  from 1.5 mg to 3 mg daily to optimize adjunctive treatment for depression. Continue Effexor  225 mg daily and Doxepin  50 mg nightly. Follow up in 4-6 weeks to assess response to dose adjustment

## 2024-01-30 ENCOUNTER — Ambulatory Visit: Admitting: Nurse Practitioner

## 2024-01-30 NOTE — Telephone Encounter (Signed)
 The patient has been scheduled for an ov with Megan to discuss the procedure.

## 2024-01-31 ENCOUNTER — Encounter: Payer: Self-pay | Admitting: Family Medicine

## 2024-02-01 ENCOUNTER — Other Ambulatory Visit: Payer: Self-pay | Admitting: Family Medicine

## 2024-02-01 DIAGNOSIS — F900 Attention-deficit hyperactivity disorder, predominantly inattentive type: Secondary | ICD-10-CM

## 2024-02-01 MED ORDER — AMPHETAMINE-DEXTROAMPHETAMINE 20 MG PO TABS: 20.0000 mg | ORAL_TABLET | Freq: Two times a day (BID) | ORAL | 0 refills | Status: DC

## 2024-02-14 ENCOUNTER — Encounter: Payer: Self-pay | Admitting: Physical Medicine and Rehabilitation

## 2024-02-14 ENCOUNTER — Ambulatory Visit: Admitting: Physical Medicine and Rehabilitation

## 2024-02-14 DIAGNOSIS — G8929 Other chronic pain: Secondary | ICD-10-CM

## 2024-02-14 DIAGNOSIS — M25562 Pain in left knee: Secondary | ICD-10-CM

## 2024-02-14 DIAGNOSIS — M1712 Unilateral primary osteoarthritis, left knee: Secondary | ICD-10-CM | POA: Diagnosis not present

## 2024-02-14 NOTE — Progress Notes (Signed)
 Pain Scale   Average Pain 10 Patient advising she has chronic lower back pain radiating bilaterally to knees and pain is constant without relief.         +Driver, -BT, -Dye Allergies.

## 2024-02-14 NOTE — Progress Notes (Signed)
 Anna Keller - 53 y.o. female MRN [id number]  Date of birth: 1970/05/21  Office Visit Note: Visit Date: 02/14/2024 PCP: Anna Sand, NP (Inactive) Referred by: Anna Keller ORN, PA-C  Subjective: Chief Complaint  Patient presents with   Lower Back - Pain   HPI: Anna Keller is a 53 y.o. female who comes in today per the request of Keller Gretta, PA for evaluation of chronic, worsening and severe bilateral knee pain, left greater than right. Pain ongoing for several years. She is here today to discuss candidacy for possible genicular nerve ablation. Her pain is constant, complicated by rheumatoid arthritis. Reports popping and clicking sensation to left knee. She describes her knee pain as sore and aching sensation, currently rates as 8 out of 10. She has tried formal physical therapy, home exercise regimen and rest with minimal relief of pain. MRI imaging of left knee from 2024 shows significant tricompartmental degenerative changes. She describes multiple orthopedic issues today with her shoulders and lower back. She is being managed from chronic pain standpoint by Dr. Jeryl in [facility]. She is currently taking Celebrex  and Lyrica. Patient denies focal weakness. No recent trauma or falls.   Patients course is complicated by rheumatoid arthritis, morbid obesity, anxiety, depression and chronic pain syndrome.      Review of Systems  Musculoskeletal:  Positive for joint pain.  Neurological:  Negative for tingling, sensory change, focal weakness and weakness.  All other systems reviewed and are negative.  Otherwise per HPI.  Assessment & Plan: Visit Diagnoses:    ICD-10-CM   1. Chronic pain of left knee  M25.562 Ambulatory referral to Physical Medicine Rehab   G89.29     2. Primary osteoarthritis of left knee  M17.12 Ambulatory referral to Physical Medicine Rehab       Plan: Findings:  Chronic, worsening and severe bilateral knee pain, left greater than right.  Patient continues to have severe pain despite good conservative therapies such as formal physical therapy, home exercise regimen, rest and use of medications. Patient does have advanced tricompartmental arthritis to left knee. We discussed treatment plan in detail today. Next step is to perform diagnostic left genicular nerve blocks under fluoroscopic guidance. If good relief of pain with diagnostic blocks we discussed possibility of longer sustained pain relief with genicular nerve ablation. I discussed ablation procedure in detail, she has no questions at this time. She is prescribed Xanax , can take 30 minutes prior to injection to help with anxiety. No red flag symptoms noted upon exam today.     Meds & Orders: No orders of the defined types were placed in this encounter.   Orders Placed This Encounter  Procedures   Ambulatory referral to Physical Medicine Rehab    Follow-up: Return for Left genicular nerve blocks.   Procedures: No procedures performed      Clinical History: No specialty comments available.   She reports that she quit smoking about 8 years ago. Her smoking use included cigarettes. She started smoking about 33 years ago. She has a 25 pack-year smoking history. She has never used smokeless tobacco.  Recent Labs    04/05/23 1013 11/08/23 0929  HGBA1C 5.8* 5.6    Objective:  VS:  HT:    WT:   BMI:     BP:   HR: bpm  TEMP: ( )  RESP:  Physical Exam Vitals and nursing note reviewed.  HENT:     Head: Normocephalic and atraumatic.     Right  Ear: External ear normal.     Left Ear: External ear normal.     Nose: Nose normal.     Mouth/Throat:     Mouth: Mucous membranes are moist.  Eyes:     Extraocular Movements: Extraocular movements intact.  Cardiovascular:     Rate and Rhythm: Normal rate.     Pulses: Normal pulses.  Pulmonary:     Effort: Pulmonary effort is normal.  Abdominal:     General: Abdomen is flat. There is no distension.  Musculoskeletal:         General: Tenderness present.     Cervical back: Normal range of motion.     Comments: Pain noted upon palpation of left knee, limited range of motion, no effusion noted.   Skin:    General: Skin is warm and dry.     Capillary Refill: Capillary refill takes less than 2 seconds.  Neurological:     General: No focal deficit present.     Mental Status: She is alert and oriented to person, place, and time.  Psychiatric:        Mood and Affect: Mood normal.        Behavior: Behavior normal.     Ortho Exam  Imaging: No results found.  Past Medical/Family/Surgical/Social History: Medications & Allergies reviewed per EMR, new medications updated. 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 05/05/2022   History of reverse total replacement of right shoulder joint 04/01/2022   Unilateral primary osteoarthritis, left knee 01/27/2022   Traumatic complete tear of right rotator cuff    S/P laparoscopic hysterectomy 04/14/2021   Grief reaction 03/23/2019   Gastroesophageal reflux disease without esophagitis 03/23/2019   Constipation 03/23/2019   Eczema 03/23/2019   Pain in joint, multiple sites 07/14/2018   Family history of arthritis 07/14/2018   Peeling skin 07/14/2018   Class 3 severe obesity due to excess calories with body mass index (BMI) of 40.0 to 44.9 in adult [facility]) 07/14/2018   Anxiety 07/14/2018   Depression 07/14/2018   Past  Medical History:  Diagnosis Date   ADHD (attention deficit hyperactivity disorder)    Ankylosing spondylitis (HCC)    Anxiety    Arthritis    Depression    Eczema    Family history of adverse reaction to anesthesia    dad would be crazy, talk out of his head-per pt   Headache    Rheumatoid arthritis (HCC)    Family History  Problem Relation Age of Onset   Hypertension Mother    Arthritis Father    Heart disease Sister    Pulmonary Hypertension Sister    COPD Sister    Diabetes Sister    Breast cancer Maternal Grandmother    Heart attack Paternal Grandmother    Breast cancer Maternal Aunt    Breast cancer Cousin    Past Surgical History:  Procedure Laterality Date   BLADDER SUSPENSION N/A 04/14/2021   Procedure: TRANSVAGINAL TAPE (TVT) PROCEDURE;  Surgeon: Bettina Muskrat, MD;  Location: [facility] OR;  Service: Gynecology;  Laterality: N/A;  GYNCARE TVT EXACT   CYSTOSCOPY N/A 04/14/2021   Procedure: CYSTOSCOPY;  Surgeon: Bettina Muskrat, MD;  Location: [facility] OR;  Service: Gynecology;  Laterality: N/A;   KNEE ARTHROSCOPY Left 1988   REVERSE SHOULDER ARTHROPLASTY Right 01/05/2022   Procedure: RIGHT SHOULDER REVERSE SHOULDER ARTHROPLASTY;  Surgeon: Genelle Standing, MD;  Location: [facility] OR;  Service: Orthopedics;  Laterality: Right;   REVERSE SHOULDER ARTHROPLASTY Right 04/01/2022   Procedure: RIGHT REVERSE SHOULDER ARTHROPLASTY REVISION;  Surgeon: Genelle Standing, MD;  Location: [facility] OR;  Service: Orthopedics;  Laterality: Right;   REVERSE SHOULDER ARTHROPLASTY Right 07/22/2022   Procedure: RIGHT SHOULDER REVISION  ARTHROPLASTY;  Surgeon: Genelle Standing, MD;  Location: [facility] OR;  Service: Orthopedics;  Laterality: Right;   TONSILLECTOMY     as a child   TOTAL LAPAROSCOPIC HYSTERECTOMY WITH SALPINGECTOMY Bilateral 04/14/2021   Procedure: TOTAL LAPAROSCOPIC HYSTERECTOMY WITH BILATERAL SALPINGECTOMY AND BILATERAL OOPHERECTOMY;  Surgeon: Bettina Muskrat, MD;  Location: [facility] OR;  Service: Gynecology;  Laterality:  Bilateral;   Social History   Occupational History   Not on file  Tobacco Use   Smoking status: Former    Current packs/day: 0.00    Average packs/day: 1 pack/day for 25.0 years (25.0 ttl pk-yrs)    Types: Cigarettes    Start date: 01/09/1991    Quit date: 01/09/2016    Years since quitting: 8.1   Smokeless tobacco: Never  Vaping Use   Vaping status: Never Used  Substance and Sexual Activity   Alcohol use: Not Currently   Drug use: No   Sexual activity: Yes    Partners: Male    Birth control/protection: None

## 2024-02-29 ENCOUNTER — Encounter: Payer: Self-pay | Admitting: Family Medicine

## 2024-03-01 ENCOUNTER — Other Ambulatory Visit: Payer: Self-pay | Admitting: Orthopaedic Surgery

## 2024-03-01 DIAGNOSIS — G8929 Other chronic pain: Secondary | ICD-10-CM

## 2024-03-02 ENCOUNTER — Other Ambulatory Visit: Payer: Self-pay

## 2024-03-02 ENCOUNTER — Encounter: Payer: Self-pay | Admitting: Family Medicine

## 2024-03-02 DIAGNOSIS — F900 Attention-deficit hyperactivity disorder, predominantly inattentive type: Secondary | ICD-10-CM

## 2024-03-02 MED ORDER — AMPHETAMINE-DEXTROAMPHETAMINE 20 MG PO TABS: 20.0000 mg | ORAL_TABLET | Freq: Two times a day (BID) | ORAL | 0 refills | Status: DC

## 2024-03-05 ENCOUNTER — Encounter: Payer: Self-pay | Admitting: Radiology

## 2024-03-08 ENCOUNTER — Other Ambulatory Visit: Payer: Self-pay

## 2024-03-08 DIAGNOSIS — F900 Attention-deficit hyperactivity disorder, predominantly inattentive type: Secondary | ICD-10-CM

## 2024-03-08 MED ORDER — AMPHETAMINE-DEXTROAMPHETAMINE 20 MG PO TABS: 20.0000 mg | ORAL_TABLET | Freq: Two times a day (BID) | ORAL | 0 refills | Status: DC

## 2024-03-09 ENCOUNTER — Other Ambulatory Visit

## 2024-03-09 ENCOUNTER — Ambulatory Visit
Admission: RE | Admit: 2024-03-09 | Discharge: 2024-03-09 | Disposition: A | Discharge status: Home / Self Care | Source: Ambulatory Visit | Attending: Orthopaedic Surgery | Admitting: Orthopaedic Surgery

## 2024-03-09 DIAGNOSIS — G8929 Other chronic pain: Secondary | ICD-10-CM

## 2024-03-11 ENCOUNTER — Other Ambulatory Visit: Payer: Self-pay | Admitting: Physician Assistant

## 2024-03-12 ENCOUNTER — Other Ambulatory Visit: Payer: Self-pay

## 2024-03-12 ENCOUNTER — Ambulatory Visit (INDEPENDENT_AMBULATORY_CARE_PROVIDER_SITE_OTHER): Admitting: Physical Medicine and Rehabilitation

## 2024-03-12 ENCOUNTER — Encounter: Payer: Self-pay | Admitting: Physical Medicine and Rehabilitation

## 2024-03-12 VITALS — BP 140/90 | HR 80

## 2024-03-12 DIAGNOSIS — M1712 Unilateral primary osteoarthritis, left knee: Secondary | ICD-10-CM | POA: Diagnosis not present

## 2024-03-12 DIAGNOSIS — G8929 Other chronic pain: Secondary | ICD-10-CM

## 2024-03-12 DIAGNOSIS — M25562 Pain in left knee: Secondary | ICD-10-CM | POA: Diagnosis not present

## 2024-03-12 MED ORDER — BUPIVACAINE HCL 0.5 % IJ SOLN
3.0000 mL | Freq: Once | INTRAMUSCULAR | Status: AC
  Administered 2024-03-12: 3 mL

## 2024-03-12 NOTE — Progress Notes (Signed)
 Anna Keller - 53 y.o. female MRN [id number]  Date of birth: 02-07-1971  Office Visit Note: Visit Date: 03/12/2024 PCP: Glennon Sand, NP (Inactive) Referred by: Trudy Duwaine BRAVO, NP  Subjective: Chief Complaint  Patient presents with   Left Knee - Pain   HPI:  Anna Keller is a 53 y.o. female who comes in today at the request of Duwaine Trudy, FNP and Tory Gaskins, PA-C for planned Left  Genicular nerve block block with fluoroscopic guidance.  The patient has failed conservative care including injections, home exercise, medications, time and activity modification.  Nerve block will be performed in a double block paradigm with goal towards radiofrequency ablation of the same genicular nerves for longer term definitive care.  Please see requesting physician notes for further details and justification.     ROS Otherwise per HPI.  Assessment & Plan: Visit Diagnoses:    ICD-10-CM   1. Chronic pain of left knee  M25.562 XR C-ARM NO REPORT   G89.29 Nerve Block    bupivacaine  (MARCAINE ) 0.5 % (with pres) injection 3 mL    2. Primary osteoarthritis of left knee  M17.12 XR C-ARM NO REPORT    Nerve Block    bupivacaine  (MARCAINE ) 0.5 % (with pres) injection 3 mL      Plan: No additional findings.   Meds & Orders:  Meds ordered this encounter  Medications   bupivacaine  (MARCAINE ) 0.5 % (with pres) injection 3 mL    Orders Placed This Encounter  Procedures   Nerve Block   XR C-ARM NO REPORT    Follow-up: Return for Review Pain Diary.   Procedures: No procedures performed  Genicular Nerve Blockade with Fluoroscopic Guidance  Patient: Anna Keller      Date of Birth: 1970-07-05 MRN: [id number] PCP: Glennon Sand, NP (Inactive)      Visit Date: 03/12/2024   Universal Protocol:    Date/Time: 11/10/252:55 PM  Consent Given By: the patient  Position: SUPINE  Additional Comments: Vital signs were monitored before and after the procedure. Patient  was prepped and draped in the usual sterile fashion. The correct patient, procedure, and site was verified.   Injection Procedure Details:  Procedure Site One Meds Administered:  Meds ordered this encounter  Medications   bupivacaine  (MARCAINE ) 0.5 % (with pres) injection 3 mL     Laterality: Left  Location/Site:  Superior medial, superior lateral and inferior medial genicular nerve  Needle size: 3.5 inch 25-gauge spinal needle  Needle Placement: Just posterior to midline on the lateral view at the locations noted in the procedure note.   Findings:    -Comments: Excellent position of the needle tip using biplanar imaging with low contrast showing no vascular flow.  Procedure Details: The fluoroscope was positioned in a 90 degree anteroposterior (AP) fluoroscopic view and then tilted to obtain a true AP view of the knee joint.  The target areas are the mid-point of the curve formed where the femoral shaft and epicondyles meet as well as the midpoint of the curve on the medial side where the tibial shaft meets the plateau.  Using fluoroscopic guidance the target areas were infiltrated using a 27-gauge needle and 1-2 mL of 1% lidocaine  without epinephrine  to provide local anesthetic.  Then, for each target area a 3-1/2 inch 25-gauge spinal needle was driven down under intermittent fluoroscopic guidance to the periosteum of the target site.  Once all 3 needles were in place in the AP view, a lateral view was  obtained at each needle was driven control to the midpoint of the shafts respectively.  A spot film was obtained of the lateral view and saved.  Next, an AP view was obtained once again to confirm placement.  A spot film then was obtained and saved.  After fluoroscopic imaging was used to determine and confirm placement, 0.5-1 mL of injectate was delivered at each target location.  The patient was given a pain diary to complete and return to the office.      Additional Comments:   The patient tolerated the procedure well Dressing: Band-Aid    Post-procedure details: Patient was observed during the procedure. Post-procedure instructions were reviewed.  Patient left the clinic in stable condition.          Clinical History: No specialty comments available.     Objective:  VS:  HT:    WT:   BMI:     BP:(!) 140/90  HR:80bpm  TEMP: ( )  RESP:  Physical Exam Vitals and nursing note reviewed.  Constitutional:      General: She is not in acute distress.    Appearance: Normal appearance. She is obese. She is not ill-appearing.  HENT:     Head: Normocephalic and atraumatic.     Right Ear: External ear normal.     Left Ear: External ear normal.  Eyes:     Extraocular Movements: Extraocular movements intact.  Cardiovascular:     Rate and Rhythm: Normal rate.     Pulses: Normal pulses.  Pulmonary:     Effort: Pulmonary effort is normal. No respiratory distress.  Abdominal:     General: There is no distension.     Palpations: Abdomen is soft.  Musculoskeletal:        General: Tenderness present.     Cervical back: Neck supple.     Right lower leg: No edema.     Left lower leg: No edema.     Comments: Patient has good distal strength with no pain over the greater trochanters.  No clonus or focal weakness.  Skin:    Findings: No erythema, lesion or rash.  Neurological:     General: No focal deficit present.     Mental Status: She is alert and oriented to person, place, and time.     Sensory: No sensory deficit.     Motor: No weakness or abnormal muscle tone.     Coordination: Coordination normal.  Psychiatric:        Mood and Affect: Mood normal.        Behavior: Behavior normal.      Imaging: No results found.

## 2024-03-12 NOTE — Progress Notes (Signed)
 Pain Scale   Average Pain 10 Patient advising she has chronic left knee pain that is constant without relief.        +Driver, -BT, -Dye Allergies.

## 2024-03-12 NOTE — Procedures (Signed)
 Genicular Nerve Blockade with Fluoroscopic Guidance  Patient: Anna Keller      Date of Birth: 06-12-1970 MRN: [id number] PCP: Glennon Sand, NP (Inactive)      Visit Date: 03/12/2024   Universal Protocol:    Date/Time: 11/10/252:55 PM  Consent Given By: the patient  Position: SUPINE  Additional Comments: Vital signs were monitored before and after the procedure. Patient was prepped and draped in the usual sterile fashion. The correct patient, procedure, and site was verified.   Injection Procedure Details:  Procedure Site One Meds Administered:  Meds ordered this encounter  Medications   bupivacaine  (MARCAINE ) 0.5 % (with pres) injection 3 mL     Laterality: Left  Location/Site:  Superior medial, superior lateral and inferior medial genicular nerve  Needle size: 3.5 inch 25-gauge spinal needle  Needle Placement: Just posterior to midline on the lateral view at the locations noted in the procedure note.   Findings:    -Comments: Excellent position of the needle tip using biplanar imaging with low contrast showing no vascular flow.  Procedure Details: The fluoroscope was positioned in a 90 degree anteroposterior (AP) fluoroscopic view and then tilted to obtain a true AP view of the knee joint.  The target areas are the mid-point of the curve formed where the femoral shaft and epicondyles meet as well as the midpoint of the curve on the medial side where the tibial shaft meets the plateau.  Using fluoroscopic guidance the target areas were infiltrated using a 27-gauge needle and 1-2 mL of 1% lidocaine  without epinephrine  to provide local anesthetic.  Then, for each target area a 3-1/2 inch 25-gauge spinal needle was driven down under intermittent fluoroscopic guidance to the periosteum of the target site.  Once all 3 needles were in place in the AP view, a lateral view was obtained at each needle was driven control to the midpoint of the shafts respectively.  A spot  film was obtained of the lateral view and saved.  Next, an AP view was obtained once again to confirm placement.  A spot film then was obtained and saved.  After fluoroscopic imaging was used to determine and confirm placement, 0.5-1 mL of injectate was delivered at each target location.  The patient was given a pain diary to complete and return to the office.      Additional Comments:  The patient tolerated the procedure well Dressing: Band-Aid    Post-procedure details: Patient was observed during the procedure. Post-procedure instructions were reviewed.  Patient left the clinic in stable condition.

## 2024-03-13 ENCOUNTER — Other Ambulatory Visit: Payer: Self-pay | Admitting: Physical Medicine and Rehabilitation

## 2024-03-13 DIAGNOSIS — M1712 Unilateral primary osteoarthritis, left knee: Secondary | ICD-10-CM

## 2024-03-13 DIAGNOSIS — G8929 Other chronic pain: Secondary | ICD-10-CM

## 2024-03-21 ENCOUNTER — Other Ambulatory Visit: Payer: Self-pay | Admitting: Cardiology

## 2024-03-23 ENCOUNTER — Ambulatory Visit (INDEPENDENT_AMBULATORY_CARE_PROVIDER_SITE_OTHER): Payer: Self-pay | Admitting: Family Medicine

## 2024-03-23 ENCOUNTER — Encounter: Payer: Self-pay | Admitting: Family Medicine

## 2024-03-23 VITALS — BP 125/83 | HR 82 | Ht 66.0 in | Wt 317.0 lb

## 2024-03-23 DIAGNOSIS — F331 Major depressive disorder, recurrent, moderate: Secondary | ICD-10-CM | POA: Diagnosis not present

## 2024-03-23 DIAGNOSIS — G4700 Insomnia, unspecified: Secondary | ICD-10-CM

## 2024-03-23 MED ORDER — DOXEPIN HCL 50 MG PO CAPS: 50.0000 mg | ORAL_CAPSULE | Freq: Every day | ORAL | 3 refills | Status: AC

## 2024-03-23 MED ORDER — VENLAFAXINE HCL ER 150 MG PO CP24: 150.0000 mg | ORAL_CAPSULE | Freq: Two times a day (BID) | ORAL | 1 refills | Status: AC

## 2024-03-23 MED ORDER — CARIPRAZINE HCL 3 MG PO CAPS: 3.0000 mg | ORAL_CAPSULE | Freq: Every day | ORAL | 2 refills | Status: AC

## 2024-03-23 NOTE — Patient Instructions (Signed)
 I appreciate the opportunity to provide care to you today!    Please pick up your refills at the pharmacy   Please follow up if your symptoms worsen or fail to improve.     Please continue to a heart-healthy diet and increase your physical activities. Try to exercise for at least five days a week.    It was a pleasure to see you and I look forward to continuing to work together on your health and well-being. Please do not hesitate to call the office if you need care or have questions about your care.  In case of emergency, please visit the Emergency Department for urgent care, or contact our clinic at [phone] to schedule an appointment. We're here to help you!   Have a wonderful day and week. With Gratitude, Meade JENEANE Gerlach MSN, FNP-BC, PMHNP-BC

## 2024-03-23 NOTE — Progress Notes (Unsigned)
 Established Patient Office Visit  Subjective:  Patient ID: Anna Keller, female    DOB: 24-Jul-1970  Age: 53 y.o. MRN: [id number]  CC:  Chief Complaint  Patient presents with   medication follow up    Vraylar  follow up    Medication Refill    Patient is needing refills on Doxepin  and Effexor      HPI Anna Keller is a 53 y.o. female with past medical history of *** presents for f/u of *** chronic medical conditions.  Past Medical History:  Diagnosis Date   ADHD (attention deficit hyperactivity disorder)    Ankylosing spondylitis (HCC)    Anxiety    Arthritis    Depression    Eczema    Family history of adverse reaction to anesthesia    dad would be crazy, talk out of his head-per pt   Headache    Rheumatoid arthritis (HCC)     Past Surgical History:  Procedure Laterality Date   BLADDER SUSPENSION N/A 04/14/2021   Procedure: TRANSVAGINAL TAPE (TVT) PROCEDURE;  Surgeon: Bettina Muskrat, MD;  Location: [facility];  Service: Gynecology;  Laterality: N/A;  GYNCARE TVT EXACT   CYSTOSCOPY N/A 04/14/2021   Procedure: CYSTOSCOPY;  Surgeon: Bettina Muskrat, MD;  Location: [facility];  Service: Gynecology;  Laterality: N/A;   KNEE ARTHROSCOPY Left 1988   REVERSE SHOULDER ARTHROPLASTY Right 01/05/2022   Procedure: RIGHT SHOULDER REVERSE SHOULDER ARTHROPLASTY;  Surgeon: Genelle Standing, MD;  Location: [facility];  Service: Orthopedics;  Laterality: Right;   REVERSE SHOULDER ARTHROPLASTY Right 04/01/2022   Procedure: RIGHT REVERSE SHOULDER ARTHROPLASTY REVISION;  Surgeon: Genelle Standing, MD;  Location: [facility];  Service: Orthopedics;  Laterality: Right;   REVERSE SHOULDER ARTHROPLASTY Right 07/22/2022   Procedure: RIGHT SHOULDER REVISION  ARTHROPLASTY;  Surgeon: Genelle Standing, MD;  Location: [facility];  Service: Orthopedics;  Laterality: Right;   TONSILLECTOMY     as a child   TOTAL LAPAROSCOPIC HYSTERECTOMY WITH SALPINGECTOMY Bilateral 04/14/2021   Procedure: TOTAL LAPAROSCOPIC HYSTERECTOMY WITH  BILATERAL SALPINGECTOMY AND BILATERAL OOPHERECTOMY;  Surgeon: Bettina Muskrat, MD;  Location: [facility];  Service: Gynecology;  Laterality: Bilateral;    Family History  Problem Relation Age of Onset   Hypertension Mother    Arthritis Father    Heart disease Sister    Pulmonary Hypertension Sister    COPD Sister    Diabetes Sister    Breast cancer Maternal Grandmother    Heart attack Paternal Grandmother    Breast cancer Maternal Aunt    Breast cancer Cousin     Social History   Socioeconomic History   Marital status: Married    Spouse name: Ozell   Number of children: 1   Years of education: 12   Highest education level: 12th grade  Occupational History   Not on file  Tobacco Use   Smoking status: Former    Current packs/day: 0.00    Average packs/day: 1 pack/day for 25.0 years (25.0 ttl pk-yrs)    Types: Cigarettes    Start date: 01/09/1991    Quit date: 01/09/2016    Years since quitting: 8.2   Smokeless tobacco: Never  Vaping Use   Vaping status: Never Used  Substance and Sexual Activity   Alcohol use: Not Currently   Drug use: No   Sexual activity: Yes    Partners: Male    Birth control/protection: None  Other Topics Concern   Not on file  Social History Narrative   Not on file  Social Drivers of Health   Financial Resource Strain: High Risk (03/19/2024)   Overall Financial Resource Strain (CARDIA)    Difficulty of Paying Living Expenses: Hard  Food Insecurity: Food Insecurity Present (03/19/2024)   Hunger Vital Sign    Worried About Running Out of Food in the Last Year: Often true    Ran Out of Food in the Last Year: Often true  Transportation Needs: No Transportation Needs (03/19/2024)   PRAPARE - Administrator, Civil Service (Medical): No    Lack of Transportation (Non-Medical): No  Physical Activity: Inactive (03/19/2024)   Exercise Vital Sign    Days of Exercise per Week: 0 days    Minutes of Exercise per Session: Not on file  Stress:  Stress Concern Present (03/19/2024)   [facility] [employer] - Occupational Stress Questionnaire    Feeling of Stress: Very much  Social Connections: Socially Isolated (03/19/2024)   Social Connection and Isolation Panel    Frequency of Communication with Friends and Family: More than three times a week    Frequency of Social Gatherings with Friends and Family: Three times a week    Attends Religious Services: Never    Active Member of Clubs or Organizations: No    Attends Banker Meetings: Not on file    Marital Status: Separated  Intimate Partner Violence: Not At Risk (04/02/2022)   Humiliation, Afraid, Rape, and Kick questionnaire    Fear of Current or Ex-Partner: No    Emotionally Abused: No    Physically Abused: No    Sexually Abused: No    Outpatient Medications Prior to Visit  Medication Sig Dispense Refill   venlafaxine  XR (EFFEXOR -XR) 150 MG 24 hr capsule Take 150 mg by mouth 2 (two) times daily.     Abatacept  (ORENCIA  IV) Inject 1 Units into the vein every 30 (thirty) days.     ALPRAZolam  (XANAX ) 1 MG tablet Take 1 tablet (1 mg total) by mouth 3 (three) times daily as needed for anxiety. 90 tablet 2   amLODipine  (NORVASC ) 5 MG tablet Take 1 tablet (5 mg total) by mouth daily. 30 tablet 11   amphetamine -dextroamphetamine  (ADDERALL) 20 MG tablet Take 1 tablet (20 mg total) by mouth 2 (two) times daily. 60 tablet 0   cariprazine  (VRAYLAR ) 3 MG capsule Take 1 capsule (3 mg total) by mouth daily. 30 capsule 2   clobetasol  ointment (TEMOVATE ) 0.05 % Apply topically 2 (two) times daily. 30 g 1   doxepin  (SINEQUAN ) 50 MG capsule Take 1 capsule (50 mg total) by mouth at bedtime. 30 capsule 3   levocetirizine (XYZAL ) 5 MG tablet Take 5 mg by mouth every evening.     Multiple Vitamins-Minerals (MULTIVITAMIN WITH MINERALS) tablet Take 1 tablet by mouth daily.     polyethylene glycol powder (GLYCOLAX /MIRALAX ) 17 GM/SCOOP powder Take 17 g by mouth daily.  (Patient taking differently: Take 17 g by mouth daily as needed for mild constipation or moderate constipation.) 3350 g 3   rosuvastatin  (CRESTOR ) 40 MG tablet Take 1 tablet (40 mg total) by mouth daily. 90 tablet 3   solifenacin  (VESICARE ) 10 MG tablet TAKE 1 TABLET BY MOUTH EVERY DAY 30 tablet 0   No facility-administered medications prior to visit.    Allergies  Allergen Reactions   Codeine Nausea And Vomiting    ROS Review of Systems    Objective:    Physical Exam  BP 125/83   Pulse 82   Ht 5' 6 (  1.676 m)   Wt (!) 317 lb (143.8 kg)   LMP 03/24/2021   SpO2 100%   BMI 51.17 kg/m  Wt Readings from Last 3 Encounters:  03/23/24 (!) 317 lb (143.8 kg)  01/11/24 (!) 306 lb 12.8 oz (139.2 kg)  12/08/23 (!) 301 lb (136.5 kg)    Lab Results  Component Value Date   TSH 1.880 11/08/2023   Lab Results  Component Value Date   WBC 5.3 11/08/2023   HGB 13.0 11/08/2023   HCT 39.7 11/08/2023   MCV 94 11/08/2023   PLT 268 11/08/2023   Lab Results  Component Value Date   NA 143 11/08/2023   K 4.3 11/08/2023   CO2 21 11/08/2023   GLUCOSE 99 11/08/2023   BUN 15 11/08/2023   CREATININE 0.75 11/08/2023   BILITOT 0.3 11/08/2023   ALKPHOS 170 (H) 11/08/2023   AST 17 11/08/2023   ALT 18 11/08/2023   PROT 6.5 11/08/2023   ALBUMIN  4.2 11/08/2023   CALCIUM  9.5 11/08/2023   ANIONGAP 11 04/02/2022   EGFR 95 11/08/2023   Lab Results  Component Value Date   CHOL 123 11/08/2023   Lab Results  Component Value Date   HDL 48 11/08/2023   Lab Results  Component Value Date   LDLCALC 48 11/08/2023   Lab Results  Component Value Date   TRIG 160 (H) 11/08/2023   Lab Results  Component Value Date   CHOLHDL 2.6 11/08/2023   Lab Results  Component Value Date   HGBA1C 5.6 11/08/2023      Assessment & Plan:  There are no diagnoses linked to this encounter.  Follow-up: No follow-ups on file.   Clayten Allcock  Z Bacchus, FNP

## 2024-03-26 DIAGNOSIS — F331 Major depressive disorder, recurrent, moderate: Secondary | ICD-10-CM | POA: Insufficient documentation

## 2024-03-26 DIAGNOSIS — G4700 Insomnia, unspecified: Secondary | ICD-10-CM | POA: Insufficient documentation

## 2024-03-26 NOTE — Assessment & Plan Note (Signed)
 Refills sent to the pharmacy.

## 2024-03-26 NOTE — Assessment & Plan Note (Signed)
 Denies SI/HI and AVH Refills sent to the pharmacy

## 2024-04-03 NOTE — Telephone Encounter (Signed)
 scheduled

## 2024-04-05 ENCOUNTER — Other Ambulatory Visit: Payer: Self-pay | Admitting: Family Medicine

## 2024-04-05 ENCOUNTER — Ambulatory Visit (HOSPITAL_COMMUNITY)
Admission: RE | Admit: 2024-04-05 | Discharge: 2024-04-05 | Disposition: A | Source: Ambulatory Visit | Attending: Family Medicine

## 2024-04-05 ENCOUNTER — Ambulatory Visit: Admitting: Family Medicine

## 2024-04-05 ENCOUNTER — Encounter: Payer: Self-pay | Admitting: Family Medicine

## 2024-04-05 VITALS — BP 152/99 | HR 100 | Ht 66.0 in | Wt 317.0 lb

## 2024-04-05 DIAGNOSIS — Z01818 Encounter for other preprocedural examination: Secondary | ICD-10-CM | POA: Insufficient documentation

## 2024-04-05 DIAGNOSIS — R051 Acute cough: Secondary | ICD-10-CM

## 2024-04-05 MED ORDER — PROMETHAZINE-DM 6.25-15 MG/5ML PO SYRP: 5.0000 mL | ORAL_SOLUTION | Freq: Four times a day (QID) | ORAL | 0 refills | Status: DC | PRN

## 2024-04-05 NOTE — Patient Instructions (Addendum)
 I appreciate the opportunity to provide care to you today!    Labs: please stop by the lab today to get your blood drawn (CBC, BMP, HgA1c)  Please stop by [facility] to obtain a chest X-ray as part of your preoperative clearance.     Please continue to a heart-healthy diet and increase your physical activities. Try to exercise for at least five days a week.    It was a pleasure to see you and I look forward to continuing to work together on your health and well-being. Please do not hesitate to call the office if you need care or have questions about your care.  In case of emergency, please visit the Emergency Department for urgent care, or contact our clinic at [phone] to schedule an appointment. We're here to help you!   Have a wonderful day and week. With Gratitude, Meade JENEANE Gerlach MSN, FNP-BC, PMHNP-BC

## 2024-04-05 NOTE — Progress Notes (Signed)
 Established Patient Office Visit  Subjective:  Patient ID: Anna Keller, female    DOB: [birth year]  Age: 53 y.o. MRN: [id number]  CC:  Chief Complaint  Patient presents with   surgical clearance    HPI Anna Keller is a 53 y.o. female with past medical history of HTN, prediabetes, Hyperlipidemia  presents for surgical clearance.  For the details of today's visit, please refer to the assessment and plan.    Past Medical History:  Diagnosis Date   ADHD (attention deficit hyperactivity disorder)    Ankylosing spondylitis (HCC)    Anxiety    Arthritis    Depression    Eczema    Family history of adverse reaction to anesthesia    dad would be crazy, talk out of his head-per pt   Headache    Rheumatoid arthritis (HCC)     Past Surgical History:  Procedure Laterality Date   BLADDER SUSPENSION N/A 04/14/2021   Procedure: TRANSVAGINAL TAPE (TVT) PROCEDURE;  Surgeon: Bettina Muskrat, MD;  Location: [facility];  Service: Gynecology;  Laterality: N/A;  GYNCARE TVT EXACT   CYSTOSCOPY N/A 04/14/2021   Procedure: CYSTOSCOPY;  Surgeon: Bettina Muskrat, MD;  Location: [facility];  Service: Gynecology;  Laterality: N/A;   KNEE ARTHROSCOPY Left 1988   REVERSE SHOULDER ARTHROPLASTY Right 01/05/2022   Procedure: RIGHT SHOULDER REVERSE SHOULDER ARTHROPLASTY;  Surgeon: Genelle Standing, MD;  Location: [facility];  Service: Orthopedics;  Laterality: Right;   REVERSE SHOULDER ARTHROPLASTY Right 04/01/2022   Procedure: RIGHT REVERSE SHOULDER ARTHROPLASTY REVISION;  Surgeon: Genelle Standing, MD;  Location: [facility];  Service: Orthopedics;  Laterality: Right;   REVERSE SHOULDER ARTHROPLASTY Right 07/22/2022   Procedure: RIGHT SHOULDER REVISION  ARTHROPLASTY;  Surgeon: Genelle Standing, MD;  Location: [facility];  Service: Orthopedics;  Laterality: Right;   TONSILLECTOMY     as a child   TOTAL LAPAROSCOPIC HYSTERECTOMY WITH SALPINGECTOMY Bilateral 04/14/2021   Procedure: TOTAL LAPAROSCOPIC HYSTERECTOMY WITH BILATERAL  SALPINGECTOMY AND BILATERAL OOPHERECTOMY;  Surgeon: Bettina Muskrat, MD;  Location: [facility];  Service: Gynecology;  Laterality: Bilateral;    Family History  Problem Relation Age of Onset   Hypertension Mother    Arthritis Father    Heart disease Sister    Pulmonary Hypertension Sister    COPD Sister    Diabetes Sister    Breast cancer Maternal Grandmother    Heart attack Paternal Grandmother    Breast cancer Maternal Aunt    Breast cancer Cousin     Social History   Socioeconomic History   Marital status: Married    Spouse name: Ozell   Number of children: 1   Years of education: 12   Highest education level: 12th grade  Occupational History   Not on file  Tobacco Use   Smoking status: Former    Current packs/day: 0.00    Average packs/day: 1 pack/day for 25.0 years (25.0 ttl pk-yrs)    Types: Cigarettes    Start date: 01/09/1991    Quit date: 01/09/2016    Years since quitting: 8.2   Smokeless tobacco: Never  Vaping Use   Vaping status: Never Used  Substance and Sexual Activity   Alcohol use: Not Currently   Drug use: No   Sexual activity: Yes    Partners: Male    Birth control/protection: None  Other Topics Concern   Not on file  Social History Narrative   Not on file   Social Drivers of Health   Financial Resource Strain:  High Risk (03/19/2024)   Overall Financial Resource Strain (CARDIA)    Difficulty of Paying Living Expenses: Hard  Food Insecurity: Food Insecurity Present (03/19/2024)   Hunger Vital Sign    Worried About Running Out of Food in the Last Year: Often true    Ran Out of Food in the Last Year: Often true  Transportation Needs: No Transportation Needs (03/19/2024)   PRAPARE - Administrator, Civil Service (Medical): No    Lack of Transportation (Non-Medical): No  Physical Activity: Inactive (03/19/2024)   Exercise Vital Sign    Days of Exercise per Week: 0 days    Minutes of Exercise per Session: Not on file  Stress: Stress  Concern Present (03/19/2024)   [employer] of Occupational Health - Occupational Stress Questionnaire    Feeling of Stress: Very much  Social Connections: Socially Isolated (03/19/2024)   Social Connection and Isolation Panel    Frequency of Communication with Friends and Family: More than three times a week    Frequency of Social Gatherings with Friends and Family: Three times a week    Attends Religious Services: Never    Active Member of Clubs or Organizations: No    Attends Banker Meetings: Not on file    Marital Status: Separated  Intimate Partner Violence: Not At Risk (04/02/2022)   Humiliation, Afraid, Rape, and Kick questionnaire    Fear of Current or Ex-Partner: No    Emotionally Abused: No    Physically Abused: No    Sexually Abused: No    Outpatient Medications Prior to Visit  Medication Sig Dispense Refill   Abatacept  (ORENCIA  IV) Inject 1 Units into the vein every 30 (thirty) days.     ALPRAZolam  (XANAX ) 1 MG tablet Take 1 tablet (1 mg total) by mouth 3 (three) times daily as needed for anxiety. 90 tablet 2   amLODipine  (NORVASC ) 5 MG tablet Take 1 tablet (5 mg total) by mouth daily. 30 tablet 11   amphetamine -dextroamphetamine  (ADDERALL) 20 MG tablet Take 1 tablet (20 mg total) by mouth 2 (two) times daily. 60 tablet 0   cariprazine  (VRAYLAR ) 3 MG capsule Take 1 capsule (3 mg total) by mouth daily. 30 capsule 2   clobetasol  ointment (TEMOVATE ) 0.05 % Apply topically 2 (two) times daily. 30 g 1   doxepin  (SINEQUAN ) 50 MG capsule Take 1 capsule (50 mg total) by mouth at bedtime. 30 capsule 3   levocetirizine (XYZAL ) 5 MG tablet Take 5 mg by mouth every evening.     Multiple Vitamins-Minerals (MULTIVITAMIN WITH MINERALS) tablet Take 1 tablet by mouth daily.     polyethylene glycol powder (GLYCOLAX /MIRALAX ) 17 GM/SCOOP powder Take 17 g by mouth daily. (Patient taking differently: Take 17 g by mouth daily as needed for mild constipation or moderate  constipation.) 3350 g 3   rosuvastatin  (CRESTOR ) 40 MG tablet Take 1 tablet (40 mg total) by mouth daily. 90 tablet 3   solifenacin  (VESICARE ) 10 MG tablet TAKE 1 TABLET BY MOUTH EVERY DAY 30 tablet 0   venlafaxine  XR (EFFEXOR -XR) 150 MG 24 hr capsule Take 1 capsule (150 mg total) by mouth 2 (two) times daily. 90 capsule 1   No facility-administered medications prior to visit.    Allergies  Allergen Reactions   Codeine Nausea And Vomiting    ROS Review of Systems  Constitutional:  Negative for chills and fever.  Eyes:  Negative for visual disturbance.  Respiratory:  Negative for chest tightness and shortness of breath.  Neurological:  Negative for dizziness and headaches.      Objective:    Physical Exam HENT:     Head: Normocephalic.     Mouth/Throat:     Mouth: Mucous membranes are moist.  Cardiovascular:     Rate and Rhythm: Normal rate.     Heart sounds: Normal heart sounds.  Pulmonary:     Effort: Pulmonary effort is normal.     Breath sounds: Normal breath sounds.  Neurological:     Mental Status: She is alert.     BP (!) 152/99   Pulse 100   Ht 5' 6 (1.676 m)   Wt (!) 317 lb (143.8 kg)   LMP 03/24/2021   SpO2 96%   BMI 51.17 kg/m  Wt Readings from Last 3 Encounters:  04/05/24 (!) 317 lb (143.8 kg)  03/23/24 (!) 317 lb (143.8 kg)  01/11/24 (!) 306 lb 12.8 oz (139.2 kg)    Lab Results  Component Value Date   TSH 1.880 11/08/2023   Lab Results  Component Value Date   WBC 5.3 11/08/2023   HGB 13.0 11/08/2023   HCT 39.7 11/08/2023   MCV 94 11/08/2023   PLT 268 11/08/2023   Lab Results  Component Value Date   NA 143 11/08/2023   K 4.3 11/08/2023   CO2 21 11/08/2023   GLUCOSE 99 11/08/2023   BUN 15 11/08/2023   CREATININE 0.75 11/08/2023   BILITOT 0.3 11/08/2023   ALKPHOS 170 (H) 11/08/2023   AST 17 11/08/2023   ALT 18 11/08/2023   PROT 6.5 11/08/2023   ALBUMIN  4.2 11/08/2023   CALCIUM  9.5 11/08/2023   ANIONGAP 11 04/02/2022   EGFR  95 11/08/2023   Lab Results  Component Value Date   CHOL 123 11/08/2023   Lab Results  Component Value Date   HDL 48 11/08/2023   Lab Results  Component Value Date   LDLCALC 48 11/08/2023   Lab Results  Component Value Date   TRIG 160 (H) 11/08/2023   Lab Results  Component Value Date   CHOLHDL 2.6 11/08/2023   Lab Results  Component Value Date   HGBA1C 5.6 11/08/2023      Assessment & Plan:  Pre-operative clearance Assessment & Plan: UA is free of urinary infection. EKG shows sinus rhythm with ventricular ectopic beats, likely related to her use of Adderall. The patient is asymptomatic. Labs are pending.   Orders: -     EKG 12-Lead -     Urinalysis -     CBC with Differential/Platelet -     DG Chest 2 View -     Hemoglobin A1c -     BMP8+EGFR  Acute cough -     Promethazine -DM; Take 5 mLs by mouth 4 (four) times daily as needed.  Dispense: 118 mL; Refill: 0  Note: This chart has been completed using Engineer, Civil (consulting) software, and while attempts have been made to ensure accuracy, certain words and phrases may not be transcribed as intended.    Follow-up: No follow-ups on file.   Jadden Yim  Z Bacchus, FNP

## 2024-04-05 NOTE — Assessment & Plan Note (Addendum)
 UA is free of urinary infection. EKG shows sinus rhythm with ventricular ectopic beats, likely related to her use of Adderall. The patient is asymptomatic. Labs are pending.

## 2024-04-06 LAB — CBC WITH DIFFERENTIAL/PLATELET
Basophils Absolute: 0 x10E3/uL (ref 0.0–0.2)
Basos: 1 %
EOS (ABSOLUTE): 0 x10E3/uL (ref 0.0–0.4)
Eos: 0 %
Hematocrit: 43.5 % (ref 34.0–46.6)
Hemoglobin: 14.2 g/dL (ref 11.1–15.9)
Immature Grans (Abs): 0.1 x10E3/uL (ref 0.0–0.1)
Immature Granulocytes: 1 %
Lymphocytes Absolute: 1.7 x10E3/uL (ref 0.7–3.1)
Lymphs: 23 %
MCH: 30.3 pg (ref 26.6–33.0)
MCHC: 32.6 g/dL (ref 31.5–35.7)
MCV: 93 fL (ref 79–97)
Monocytes Absolute: 0.5 x10E3/uL (ref 0.1–0.9)
Monocytes: 6 %
Neutrophils Absolute: 5.3 x10E3/uL (ref 1.4–7.0)
Neutrophils: 69 %
Platelets: 341 x10E3/uL (ref 150–450)
RBC: 4.68 x10E6/uL (ref 3.77–5.28)
RDW: 14 % (ref 11.7–15.4)
WBC: 7.6 x10E3/uL (ref 3.4–10.8)

## 2024-04-06 LAB — HEMOGLOBIN A1C
Est. average glucose Bld gHb Est-mCnc: 128 mg/dL
Hgb A1c MFr Bld: 6.1 % — ABNORMAL HIGH (ref 4.8–5.6)

## 2024-04-06 LAB — BMP8+EGFR
BUN/Creatinine Ratio: 30 — ABNORMAL HIGH (ref 9–23)
BUN: 21 mg/dL (ref 6–24)
CO2: 21 mmol/L (ref 20–29)
Calcium: 9.7 mg/dL (ref 8.7–10.2)
Chloride: 102 mmol/L (ref 96–106)
Creatinine, Ser: 0.69 mg/dL (ref 0.57–1.00)
Glucose: 104 mg/dL — ABNORMAL HIGH (ref 70–99)
Potassium: 4.7 mmol/L (ref 3.5–5.2)
Sodium: 141 mmol/L (ref 134–144)
eGFR: 103 mL/min/1.73 (ref >59)

## 2024-04-10 ENCOUNTER — Other Ambulatory Visit: Payer: Self-pay | Admitting: Physician Assistant

## 2024-04-10 LAB — URINALYSIS
Bilirubin, UA: NEGATIVE
Glucose, UA: NEGATIVE
Ketones, UA: NEGATIVE
Leukocytes,UA: NEGATIVE
Nitrite, UA: NEGATIVE
Protein,UA: NEGATIVE
RBC, UA: NEGATIVE
Specific Gravity, UA: 1.015 (ref 1.005–1.030)
Urobilinogen, Ur: 0.2 mg/dL (ref 0.2–1.0)
pH, UA: 8.5 — ABNORMAL HIGH (ref 5.0–7.5)

## 2024-04-16 NOTE — Patient Instructions (Signed)
 SURGICAL WAITING ROOM VISITATION  Patients having surgery or a procedure may have no more than 2 support people in the waiting area - these visitors may rotate.    Children ages 38 and under will not be able to visit patients in [facility] under most circumstances.   Visitors with respiratory illnesses are discouraged from visiting and should remain at home.  If the patient needs to stay at the hospital during part of their recovery, the visitor guidelines for inpatient rooms apply. Pre-op nurse will coordinate an appropriate time for 1 support person to accompany patient in pre-op.  This support person may not rotate.    Please refer to the [facility] website for the visitor guidelines for Inpatients (after your surgery is over and you are in a regular room).       Your procedure is scheduled on: 04/25/24   Report to [facility] Main Entrance    Report to admitting at 5:15 AM   Call this number if you have problems the morning of surgery [phone]   Do not eat food or drink liquids :After Midnight.but may sips of water  to take meds.   Oral Hygiene is also important to reduce your risk of infection.                                    Remember - BRUSH YOUR TEETH THE MORNING OF SURGERY WITH YOUR REGULAR TOOTHPASTE    Stop all vitamins and herbal supplements 7 days before surgery.   Take these medicines the morning of surgery with A SIP OF WATER : alprazolam , amlodipine , vraylar , rosuvastatin , vesicare , effexor              You may not have any metal on your body including hair pins, jewelry, and body piercing             Do not wear make-up, lotions, powders, perfumes/cologne, or deodorant  Do not wear nail polish including gel and S&S, artificial/acrylic nails, or any other type of covering on natural nails including finger and toenails. If you have artificial nails, gel coating, etc. that needs to be removed by a nail salon please have this removed prior to  surgery or surgery may need to be canceled/ delayed if the surgeon/ anesthesia feels like they are unable to be safely monitored.   Do not shave  48 hours prior to surgery.            Do not bring valuables to the hospital. [facility] IS NOT             RESPONSIBLE   FOR VALUABLES.   Contacts, glasses, dentures or bridgework may not be worn into surgery.   Bring small overnight bag day of surgery.   DO NOT BRING YOUR HOME MEDICATIONS TO THE HOSPITAL. PHARMACY WILL DISPENSE MEDICATIONS LISTED ON YOUR MEDICATION LIST TO YOU DURING YOUR ADMISSION IN THE HOSPITAL!    Patients discharged on the day of surgery will not be allowed to drive home.  Someone NEEDS to stay with you for the first 24 hours after anesthesia.   Special Instructions: Bring a copy of your healthcare power of attorney and living will documents the day of surgery if you haven't scanned them before.              Please read over the following fact sheets you were given: IF YOU HAVE QUESTIONS ABOUT YOUR  PRE-OP INSTRUCTIONS PLEASE CALL [phone] [place]   If you received a COVID test during your pre-op visit  it is requested that you wear a mask when out in public, stay away from anyone that may not be feeling well and notify your surgeon if you develop symptoms. If you test positive for Covid or have been in contact with anyone that has tested positive in the last 10 days please notify you surgeon.      Pre-operative 4 CHG Bath Instructions  DYNA-Hex 4 Chlorhexidine  Gluconate 4% Solution Antiseptic 4 fl. oz   You can play a key role in reducing the risk of infection after surgery. Your skin needs to be as free of germs as possible. You can reduce the number of germs on your skin by washing with CHG (chlorhexidine  gluconate) soap before surgery. CHG is an antiseptic soap that kills germs and continues to kill germs even after washing.   DO NOT use if you have an allergy to chlorhexidine /CHG or antibacterial soaps. If your  skin becomes reddened or irritated, stop using the CHG and notify one of our RNs at   Please shower with the CHG soap starting 4 days before surgery using the following schedule:     Please keep in mind the following:  DO NOT shave, including legs and underarms, starting the day of your first shower.   You may shave your face at any point before/day of surgery.  Place clean sheets on your bed the day you start using CHG soap. Use a clean washcloth (not used since being washed) for each shower. DO NOT sleep with pets once you start using the CHG.  CHG Shower Instructions:  If you choose to wash your hair and private area, wash first with your normal shampoo/soap.  After you use shampoo/soap, rinse your hair and body thoroughly to remove shampoo/soap residue.  Turn the water  OFF and apply about 3 tablespoons (45 ml) of CHG soap to a CLEAN washcloth.  Apply CHG soap ONLY FROM YOUR NECK DOWN TO YOUR TOES (washing for 3-5 minutes)  DO NOT use CHG soap on face, private areas, open wounds, or sores.  Pay special attention to the area where your surgery is being performed.  If you are having back surgery, having someone wash your back for you may be helpful. Wait 2 minutes after CHG soap is applied, then you may rinse off the CHG soap.  Pat dry with a clean towel  Put on clean clothes/pajamas   If you choose to wear lotion, please use ONLY the CHG-compatible lotions on the back of this paper.     Additional instructions for the day of surgery: DO NOT APPLY any lotions, deodorants, cologne, or perfumes.   Put on clean/comfortable clothes.  Brush your teeth.  Ask your nurse before applying any prescription medications to the skin.   CHG Compatible Lotions   Aveeno Moisturizing lotion  Cetaphil Moisturizing Cream  Cetaphil Moisturizing Lotion  Clairol Herbal Essence Moisturizing Lotion, Dry Skin  Clairol Herbal Essence Moisturizing Lotion, Extra Dry Skin  Clairol Herbal Essence  Moisturizing Lotion, Normal Skin  Curel Age Defying Therapeutic Moisturizing Lotion with Alpha Hydroxy  Curel Extreme Care Body Lotion  Curel Soothing Hands Moisturizing Hand Lotion  Curel Therapeutic Moisturizing Cream, Fragrance-Free  Curel Therapeutic Moisturizing Lotion, Fragrance-Free  Curel Therapeutic Moisturizing Lotion, Original Formula  Eucerin Daily Replenishing Lotion  Eucerin Dry Skin Therapy Plus Alpha Hydroxy Crme  Eucerin Dry Skin Therapy Plus Alpha Hydroxy Lotion  Eucerin  Original Crme  Eucerin Original Lotion  Eucerin Plus Crme Eucerin Plus Lotion  Eucerin TriLipid Replenishing Lotion  Keri Anti-Bacterial Hand Lotion  Keri Deep Conditioning Original Lotion Dry Skin Formula Softly Scented  Keri Deep Conditioning Original Lotion, Fragrance Free Sensitive Skin Formula  Keri Lotion Fast Absorbing Fragrance Free Sensitive Skin Formula  Keri Lotion Fast Absorbing Softly Scented Dry Skin Formula  Keri Original Lotion  Keri Skin Renewal Lotion Keri Silky Smooth Lotion  Keri Silky Smooth Sensitive Skin Lotion  Nivea Body Creamy Conditioning Oil  Nivea Body Extra Enriched Lotion  Nivea Body Original Lotion  Nivea Body Sheer Moisturizing Lotion Nivea Crme  Nivea Skin Firming Lotion  NutraDerm 30 Skin Lotion  NutraDerm Skin Lotion  NutraDerm Therapeutic Skin Cream  NutraDerm Therapeutic Skin Lotion  ProShield Protective Hand Cream  Incentive Spirometer (Watch this video at home: [email or web address])  An incentive spirometer is a tool that can help keep your lungs clear and active. This tool measures how well you are filling your lungs with each breath. Taking long deep breaths may help reverse or decrease the chance of developing breathing (pulmonary) problems (especially infection) following: A long period of time when you are unable to move or be active. BEFORE THE PROCEDURE  If the spirometer includes an indicator to show your best effort,  your nurse or respiratory therapist will set it to a desired goal. If possible, sit up straight or lean slightly forward. Try not to slouch. Hold the incentive spirometer in an upright position. INSTRUCTIONS FOR USE  Sit on the edge of your bed if possible, or sit up as far as you can in bed or on a chair. Hold the incentive spirometer in an upright position. Breathe out normally. Place the mouthpiece in your mouth and seal your lips tightly around it. Breathe in slowly and as deeply as possible, raising the piston or the ball toward the top of the column. Hold your breath for 3-5 seconds or for as long as possible. Allow the piston or ball to fall to the bottom of the column. Remove the mouthpiece from your mouth and breathe out normally. Rest for a few seconds and repeat Steps 1 through 7 at least 10 times every 1-2 hours when you are awake. Take your time and take a few normal breaths between deep breaths. The spirometer may include an indicator to show your best effort. Use the indicator as a goal to work toward during each repetition. After each set of 10 deep breaths, practice coughing to be sure your lungs are clear. If you have an incision (the cut made at the time of surgery), support your incision when coughing by placing a pillow or rolled up towels firmly against it. Once you are able to get out of bed, walk around indoors and cough well. You may stop using the incentive spirometer when instructed by your caregiver.  RISKS AND COMPLICATIONS Take your time so you do not get dizzy or light-headed. If you are in pain, you may need to take or ask for pain medication before doing incentive spirometry. It is harder to take a deep breath if you are having pain. AFTER USE Rest and breathe slowly and easily. It can be helpful to keep track of a log of your progress. Your caregiver can provide you with a simple table to help with this. If you are using the spirometer at home, follow these  instructions: SEEK MEDICAL CARE IF:  You are having difficultly using the spirometer. You  have trouble using the spirometer as often as instructed. Your pain medication is not giving enough relief while using the spirometer. You develop fever of 100.5 F (38.1 C) or higher. SEEK IMMEDIATE MEDICAL CARE IF:  You cough up bloody sputum that had not been present before. You develop fever of 102 F (38.9 C) or greater. You develop worsening pain at or near the incision site. MAKE SURE YOU:  Understand these instructions. Will watch your condition. Will get help right away if you are not doing well or get worse. Document Released: 08/30/2006 Document Revised: 07/12/2011 Document Reviewed: 10/31/2006   WHAT IS A BLOOD TRANSFUSION? Blood Transfusion Information  A transfusion is the replacement of blood or some of its parts. Blood is made up of multiple cells which provide different functions. Red blood cells carry oxygen and are used for blood loss replacement. White blood cells fight against infection. Platelets control bleeding. Plasma helps clot blood. Other blood products are available for specialized needs, such as hemophilia or other clotting disorders. BEFORE THE TRANSFUSION  Who gives blood for transfusions?  Healthy volunteers who are fully evaluated to make sure their blood is safe. This is blood bank blood. Transfusion therapy is the safest it has ever been in the practice of medicine. Before blood is taken from a donor, a complete history is taken to make sure that person has no history of diseases nor engages in risky social behavior (examples are intravenous drug use or sexual activity with multiple partners). The donor's travel history is screened to minimize risk of transmitting infections, such as malaria. The donated blood is tested for signs of infectious diseases, such as HIV and hepatitis. The blood is then tested to be sure it is compatible with you in order to minimize  the chance of a transfusion reaction. If you or a relative donates blood, this is often done in anticipation of surgery and is not appropriate for emergency situations. It takes many days to process the donated blood. RISKS AND COMPLICATIONS Although transfusion therapy is very safe and saves many lives, the main dangers of transfusion include:  Getting an infectious disease. Developing a transfusion reaction. This is an allergic reaction to something in the blood you were given. Every precaution is taken to prevent this. The decision to have a blood transfusion has been considered carefully by your caregiver before blood is given. Blood is not given unless the benefits outweigh the risks. AFTER THE TRANSFUSION Right after receiving a blood transfusion, you will usually feel much better and more energetic. This is especially true if your red blood cells have gotten low (anemic). The transfusion raises the level of the red blood cells which carry oxygen, and this usually causes an energy increase. The nurse administering the transfusion will monitor you carefully for complications. HOME CARE INSTRUCTIONS  No special instructions are needed after a transfusion. You may find your energy is better. Speak with your caregiver about any limitations on activity for underlying diseases you may have. SEEK MEDICAL CARE IF:  Your condition is not improving after your transfusion. You develop redness or irritation at the intravenous (IV) site. SEEK IMMEDIATE MEDICAL CARE IF:  Any of the following symptoms occur over the next 12 hours: Shaking chills. You have a temperature by mouth above 102 F (38.9 C), not controlled by medicine. Chest, back, or muscle pain. People around you feel you are not acting correctly or are confused. Shortness of breath or difficulty breathing. Dizziness and fainting. You get a  rash or develop hives. You have a decrease in urine output. Your urine turns a dark color or changes  to pink, red, or brown. Any of the following symptoms occur over the next 10 days: You have a temperature by mouth above 102 F (38.9 C), not controlled by medicine. Shortness of breath. Weakness after normal activity. The white part of the eye turns yellow (jaundice). You have a decrease in the amount of urine or are urinating less often. Your urine turns a dark color or changes to pink, red, or brown.   [facility]- Preparing for Total Shoulder Arthroplasty    Before surgery, you can play an important role. Because skin is not sterile, your skin needs to be as free of germs as possible. You can reduce the number of germs on your skin by using the following products. Benzoyl Peroxide Gel Reduces the number of germs present on the skin Applied twice a day to shoulder area starting two days before surgery    ==================================================================  Please follow these instructions carefully:  BENZOYL PEROXIDE 5% GEL  Please do not use if you have an allergy to benzoyl peroxide.   If your skin becomes reddened/irritated stop using the benzoyl peroxide.  Starting two days before surgery, apply as follows: Apply benzoyl peroxide in the morning and at night. Apply after taking a shower. If you are not taking a shower clean entire shoulder front, back, and side along with the armpit with a clean wet washcloth.  Place a quarter-sized dollop on your shoulder and rub in thoroughly, making sure to cover the front, back, and side of your shoulder, along with the armpit.   2 days before ____ AM   ____ PM              1 day before ____ AM   ____ PM                         Do this twice a day for two days.  (Last application is the night before surgery, AFTER using the CHG soap as described below).  Do NOT apply benzoyl peroxide gel on the day of surgery.

## 2024-04-16 NOTE — Progress Notes (Signed)
 COVID Vaccine received:  [x]  No []  Yes Date of any COVID positive Test in last 90 days: none PCP - [place]- Meade Fields NP Cardiologist - n/a  Chest x-ray - 04/05/24 Epic EKG -  04/05/24 Epic Stress Test -  ECHO -  Cardiac Cath -   Bowel Prep - [x]  No  []   Yes ______  Pacemaker / ICD device [x]  No []  Yes   Spinal Cord Stimulator:[x]  No []  Yes       History of Sleep Apnea? [x]  No []  Yes   CPAP used?- [x]  No []  Yes    Does the patient monitor blood sugar?          [x]  No []  Yes  []  N/A  Patient has: [x]  NO Hx DM   []  Pre-DM                 []  DM1  []   DM2 Does patient have a [employer] or Dexacom? []  No []  Yes   Fasting Blood Sugar Ranges-  Checks Blood Sugar _____ times a day  GLP1 agonist / usual dose - no GLP1 instructions:  SGLT-2 inhibitors / usual dose - no SGLT-2 instructions:   Blood Thinner / Instructions:no Aspirin  Instructions:no  Comments:   Activity level: Patient is unable  to climb a flight of stairs without difficulty; []  No CP  []  No SOB, but would have _body pain__   Patient can perform ADLs without assistance.   Anesthesia review:   Patient denies shortness of breath, fever, cough and chest pain at PAT appointment.  Patient verbalized understanding and agreement to the Pre-Surgical Instructions that were given to them at this PAT appointment. Patient was also educated of the need to review these PAT instructions again prior to his/her surgery.I reviewed the appropriate phone numbers to call if they have any and questions or concerns.

## 2024-04-17 ENCOUNTER — Other Ambulatory Visit: Payer: Self-pay

## 2024-04-17 ENCOUNTER — Encounter (HOSPITAL_COMMUNITY): Admission: RE | Admit: 2024-04-17 | Discharge: 2024-04-17 | Attending: Orthopaedic Surgery

## 2024-04-17 ENCOUNTER — Encounter (HOSPITAL_COMMUNITY): Payer: Self-pay

## 2024-04-17 VITALS — BP 144/90 | HR 93 | Temp 98.2°F | Resp 20 | Ht 66.0 in | Wt 300.0 lb

## 2024-04-17 DIAGNOSIS — Z01818 Encounter for other preprocedural examination: Secondary | ICD-10-CM

## 2024-04-17 DIAGNOSIS — Z01812 Encounter for preprocedural laboratory examination: Secondary | ICD-10-CM | POA: Insufficient documentation

## 2024-04-17 LAB — SURGICAL PCR SCREEN
MRSA, PCR: NEGATIVE
Staphylococcus aureus: POSITIVE — AB

## 2024-04-19 NOTE — Progress Notes (Signed)
 Request sent to Dr. CHARM Hair to review pt's PCR result from 04/17/24.

## 2024-04-20 NOTE — H&P (Signed)
 "   PREOPERATIVE H&P  Chief Complaint: right shoulder failed hardware, complete rotator cuff tear  HPI: Anna Keller is a 53 y.o. female who presents for preoperative history and physical prior to scheduled surgery, Procedures: REVISION, REVERSE TOTAL ARTHROPLASTY, SHOULDER BIOPSY, SYNOVIUM.   Anna Keller is a 53 year old female who presents for evaluation of a failed shoulder arthroplasty. She has undergone multiple shoulder surgeries, with this being her fifth procedure. The patient reports persistent pain and instability in the shoulder. She has a history of rheumatoid arthritis and has been off her RA medications for over two months in preparation for surgery. Previous treatments included Lyrica  and arthritis medications, but she reports that Lyrica  was ineffective. She has a history of using Tylenol  and Tramadol  for pain management, but Tramadol  was only effective for a short period. The patient has not used narcotics due to adverse effects such as nausea. She is concerned about losing Medicaid coverage, which may impact her ability to proceed with surgery. The patient is aware of the significant risks associated with revision surgery, including infection and instability.   Symptoms are rated as moderate to severe, and have been worsening.  This is significantly impairing activities of daily living.    Please see clinic note for further details on this patient's care.    She has elected for surgical management.   Past Medical History:  Diagnosis Date   ADHD (attention deficit hyperactivity disorder)    Ankylosing spondylitis (HCC)    Anxiety    Arthritis    Depression    Eczema    Family history of adverse reaction to anesthesia    dad would be crazy, talk out of his head-per pt   Headache    Rheumatoid arthritis (HCC)    Past Surgical History:  Procedure Laterality Date   BLADDER SUSPENSION N/A 04/14/2021   Procedure: TRANSVAGINAL TAPE (TVT) PROCEDURE;   Surgeon: Bettina Muskrat, MD;  Location: [facility] OR;  Service: Gynecology;  Laterality: N/A;  GYNCARE TVT EXACT   CYSTOSCOPY N/A 04/14/2021   Procedure: CYSTOSCOPY;  Surgeon: Bettina Muskrat, MD;  Location: [facility] OR;  Service: Gynecology;  Laterality: N/A;   KNEE ARTHROSCOPY Left 1988   REVERSE SHOULDER ARTHROPLASTY Right 01/05/2022   Procedure: RIGHT SHOULDER REVERSE SHOULDER ARTHROPLASTY;  Surgeon: Genelle Standing, MD;  Location: [facility] OR;  Service: Orthopedics;  Laterality: Right;   REVERSE SHOULDER ARTHROPLASTY Right 04/01/2022   Procedure: RIGHT REVERSE SHOULDER ARTHROPLASTY REVISION;  Surgeon: Genelle Standing, MD;  Location: [facility];  Service: Orthopedics;  Laterality: Right;   REVERSE SHOULDER ARTHROPLASTY Right 07/22/2022   Procedure: RIGHT SHOULDER REVISION  ARTHROPLASTY;  Surgeon: Genelle Standing, MD;  Location: [facility];  Service: Orthopedics;  Laterality: Right;   TONSILLECTOMY     as a child   TOTAL LAPAROSCOPIC HYSTERECTOMY WITH SALPINGECTOMY Bilateral 04/14/2021   Procedure: TOTAL LAPAROSCOPIC HYSTERECTOMY WITH BILATERAL SALPINGECTOMY AND BILATERAL OOPHERECTOMY;  Surgeon: Bettina Muskrat, MD;  Location: [facility] OR;  Service: Gynecology;  Laterality: Bilateral;   Social History   Socioeconomic History   Marital status: Married    Spouse name: Ozell   Number of children: 1   Years of education: 12   Highest education level: 12th grade  Occupational History   Not on file  Tobacco Use   Smoking status: Former    Current packs/day: 0.00    Average packs/day: 1 pack/day for 25.0 years (25.0 ttl pk-yrs)    Types: Cigarettes    Start date: 01/09/1991    Quit date: 01/09/2016  Years since quitting: 8.2   Smokeless tobacco: Never  Vaping Use   Vaping status: Never Used  Substance and Sexual Activity   Alcohol use: Not Currently   Drug use: No   Sexual activity: Yes    Partners: Male    Birth control/protection: None  Other Topics Concern   Not on file  Social History Narrative   Not on file   Social  Drivers of Health   Tobacco Use: Medium Risk (04/17/2024)   Patient History    Smoking Tobacco Use: Former    Smokeless Tobacco Use: Never    Passive Exposure: Not on file  Financial Resource Strain: High Risk (03/19/2024)   Overall Financial Resource Strain (CARDIA)    Difficulty of Paying Living Expenses: Hard  Food Insecurity: Food Insecurity Present (03/19/2024)   Epic    Worried About Programme Researcher, Broadcasting/film/video in the Last Year: Often true    Ran Out of Food in the Last Year: Often true  Transportation Needs: No Transportation Needs (03/19/2024)   Epic    Lack of Transportation (Medical): No    Lack of Transportation (Non-Medical): No  Physical Activity: Inactive (03/19/2024)   Exercise Vital Sign    Days of Exercise per Week: 0 days    Minutes of Exercise per Session: Not on file  Stress: Stress Concern Present (03/19/2024)   [employer] of Occupational Health - Occupational Stress Questionnaire    Feeling of Stress: Very much  Social Connections: Socially Isolated (03/19/2024)   Social Connection and Isolation Panel    Frequency of Communication with Friends and Family: More than three times a week    Frequency of Social Gatherings with Friends and Family: Three times a week    Attends Religious Services: Never    Active Member of Clubs or Organizations: No    Attends Banker Meetings: Not on file    Marital Status: Separated  Depression (PHQ2-9): High Risk (03/23/2024)   Depression (PHQ2-9)    PHQ-2 Score: 20  Alcohol Screen: Not on file  Housing: Unknown (03/19/2024)   Epic    Unable to Pay for Housing in the Last Year: Patient declined    Number of Times Moved in the Last Year: Not on file    Homeless in the Last Year: No  Utilities: Low Risk (07/05/2023)   Received from [facility]   Utilities    In the past 12 months has the electric, gas, oil, or water  company threatened to shut off services in your home? : No  Health Literacy: Not on file    Family History  Problem Relation Age of Onset   Hypertension Mother    Arthritis Father    Heart disease Sister    Pulmonary Hypertension Sister    COPD Sister    Diabetes Sister    Breast cancer Maternal Grandmother    Heart attack Paternal Grandmother    Breast cancer Maternal Aunt    Breast cancer Cousin    Allergies[1] Prior to Admission medications  Medication Sig Start Date End Date Taking? Authorizing Provider  amLODipine  (NORVASC ) 5 MG tablet Take 1 tablet (5 mg total) by mouth daily. 11/24/23 11/23/24 Yes Glennon Sand, NP  amphetamine -dextroamphetamine  (ADDERALL) 20 MG tablet Take 1 tablet (20 mg total) by mouth 2 (two) times daily. 03/08/24  Yes Bevely Doffing, FNP  cariprazine  (VRAYLAR ) 3 MG capsule Take 1 capsule (3 mg total) by mouth daily. 03/23/24  Yes Bacchus, Meade PEDLAR, FNP  celecoxib  (CELEBREX ) 100 MG  capsule Take 100 mg by mouth 2 (two) times daily.   Yes [provider]  clobetasol  ointment (TEMOVATE ) 0.05 % Apply topically 2 (two) times daily. Patient taking differently: Apply 1 Application topically daily as needed (skin issues on feet). 11/02/23  Yes Scoggins, Amber, NP  doxepin  (SINEQUAN ) 50 MG capsule Take 1 capsule (50 mg total) by mouth at bedtime. 03/23/24  Yes Bacchus, Meade PEDLAR, FNP  levocetirizine (XYZAL ) 5 MG tablet Take 5 mg by mouth daily.   Yes [provider]  Multiple Vitamins-Minerals (MULTIVITAMIN WITH MINERALS) tablet Take 1 tablet by mouth daily.   Yes [provider]  polyethylene glycol powder (GLYCOLAX /MIRALAX ) 17 GM/SCOOP powder Take 17 g by mouth daily. 11/22/18  Yes Lavina Damien BRAVO, FNP  pregabalin  (LYRICA ) 150 MG capsule Take 150 mg by mouth 2 (two) times daily.   Yes [provider]  rosuvastatin  (CRESTOR ) 40 MG tablet Take 1 tablet (40 mg total) by mouth daily. 08/09/23  Yes Scoggins, Hospital Doctor, NP  solifenacin  (VESICARE ) 10 MG tablet TAKE 1 TABLET BY MOUTH EVERY DAY 03/12/24  Yes Vaillancourt, Samantha, PA-C   venlafaxine  XR (EFFEXOR -XR) 150 MG 24 hr capsule Take 1 capsule (150 mg total) by mouth 2 (two) times daily. 03/23/24  Yes Bacchus, Meade PEDLAR, FNP  Abatacept  (ORENCIA  IV) Inject 1 Units into the vein every 30 (thirty) days. Patient not taking: Reported on 04/17/2024    [provider]  ALPRAZolam  (XANAX ) 1 MG tablet Take 1 tablet (1 mg total) by mouth 3 (three) times daily as needed for anxiety. Patient not taking: Reported on 04/17/2024 12/16/23   Glennon Sand, NP  predniSONE  (DELTASONE ) 5 MG tablet Take by mouth. Patient not taking: Reported on 04/17/2024    [provider]  promethazine -dextromethorphan (PROMETHAZINE -DM) 6.25-15 MG/5ML syrup Take 5 mLs by mouth 4 (four) times daily as needed. Patient not taking: Reported on 04/17/2024 04/05/24   Edman Meade PEDLAR, FNP    ROS: All other systems have been reviewed and were otherwise negative with the exception of those mentioned in the HPI and as above.  Physical Exam: General: Alert, no acute distress Cardiovascular: No pedal edema Respiratory: No cyanosis, no use of accessory musculature GI: No organomegaly, abdomen is soft and non-tender Skin: No lesions in the area of chief complaint Neurologic: Sensation intact distally Psychiatric: Patient is competent for consent with normal mood and affect Lymphatic: No axillary or cervical lymphadenopathy  MUSCULOSKELETAL:  Incision is well healed. Deltoid is firing today. Activate forward elevation about 20 degrees. NVID.   Imaging: A recent CAT scan shows the current state of the shoulder arthroplasty is anteriorly subluxated and the bone stock of the glenoid looks appropriate for implantation. EMG results indicate that the axillary nerve is functioning, though not normally, which is crucial for deltoid muscle activation.  BMI: Estimated body mass index is 48.42 kg/m as calculated from the following:   Height as of 04/17/24: 5' 6 (1.676 m).   Weight as of 04/17/24:  136.1 kg.  Lab Results  Component Value Date   ALBUMIN  4.2 11/08/2023   Diabetes: Patient does not have a diagnosis of diabetes. Lab Results  Component Value Date   HGBA1C 6.1 (H) 04/05/2024     Smoking Status:      Assessment: right shoulder failed hardware, complete rotator cuff tear  Plan: Plan for Procedures: REVISION, REVERSE TOTAL ARTHROPLASTY, SHOULDER BIOPSY, SYNOVIUM  The risks benefits and alternatives were discussed with the patient including but not limited to the risks of nonoperative  treatment, versus surgical intervention including infection, bleeding, nerve injury,  blood clots, cardiopulmonary complications, morbidity, mortality, among others, and they were willing to proceed.   We additionally specifically discussed risks of axillary nerve injury, infection, periprosthetic fracture, continued pain and longevity of implants prior to beginning procedure.    Patient will be admitted for inpatient treatment for surgery, pain control, OT, prophylactic antibiotics, VTE prophylaxis, and discharge planning. The patient is planning to be discharged home with outpatient PT.   The patient acknowledged the explanation, agreed to proceed with the plan and consent was signed.   Operative Plan: Right reverse to reverse total shoulder arthroplasty (fiber tape below the baseplate to try and restrain anteriorly) Discharge Medications: standard DVT Prophylaxis: aspirin  Physical Therapy: delayed Special Discharge needs: Sling. IceMan   Aleck LOISE Stalling, PA-C  04/20/2024 8:13 AM     [1]  Allergies Allergen Reactions   Codeine Nausea And Vomiting   "

## 2024-04-23 ENCOUNTER — Encounter: Payer: Self-pay | Admitting: Family Medicine

## 2024-04-25 ENCOUNTER — Other Ambulatory Visit (HOSPITAL_COMMUNITY): Payer: Self-pay

## 2024-04-25 ENCOUNTER — Encounter (HOSPITAL_COMMUNITY): Payer: Self-pay | Admitting: Orthopaedic Surgery

## 2024-04-25 ENCOUNTER — Encounter (HOSPITAL_COMMUNITY): Admission: RE | Disposition: A | Payer: Self-pay | Source: Home / Self Care | Attending: Orthopaedic Surgery

## 2024-04-25 ENCOUNTER — Inpatient Hospital Stay (HOSPITAL_COMMUNITY): Admitting: Anesthesiology

## 2024-04-25 ENCOUNTER — Inpatient Hospital Stay (HOSPITAL_COMMUNITY)
Admission: RE | Admit: 2024-04-25 | Discharge: 2024-04-26 | DRG: 512 | Disposition: A | Discharge status: Home / Self Care | Attending: Orthopaedic Surgery | Admitting: Orthopaedic Surgery

## 2024-04-25 ENCOUNTER — Other Ambulatory Visit: Payer: Self-pay

## 2024-04-25 ENCOUNTER — Inpatient Hospital Stay (HOSPITAL_COMMUNITY)

## 2024-04-25 ENCOUNTER — Inpatient Hospital Stay (HOSPITAL_COMMUNITY): Payer: Self-pay | Admitting: Medical

## 2024-04-25 DIAGNOSIS — F32A Depression, unspecified: Secondary | ICD-10-CM | POA: Diagnosis present

## 2024-04-25 DIAGNOSIS — Z8261 Family history of arthritis: Secondary | ICD-10-CM

## 2024-04-25 DIAGNOSIS — Z803 Family history of malignant neoplasm of breast: Secondary | ICD-10-CM

## 2024-04-25 DIAGNOSIS — M75121 Complete rotator cuff tear or rupture of right shoulder, not specified as traumatic: Secondary | ICD-10-CM | POA: Diagnosis present

## 2024-04-25 DIAGNOSIS — F419 Anxiety disorder, unspecified: Secondary | ICD-10-CM | POA: Diagnosis present

## 2024-04-25 DIAGNOSIS — Z79899 Other long term (current) drug therapy: Secondary | ICD-10-CM | POA: Diagnosis not present

## 2024-04-25 DIAGNOSIS — Z90710 Acquired absence of both cervix and uterus: Secondary | ICD-10-CM | POA: Diagnosis not present

## 2024-04-25 DIAGNOSIS — T84098A Other mechanical complication of other internal joint prosthesis, initial encounter: Secondary | ICD-10-CM

## 2024-04-25 DIAGNOSIS — M069 Rheumatoid arthritis, unspecified: Secondary | ICD-10-CM | POA: Diagnosis present

## 2024-04-25 DIAGNOSIS — Z833 Family history of diabetes mellitus: Secondary | ICD-10-CM

## 2024-04-25 DIAGNOSIS — Z885 Allergy status to narcotic agent status: Secondary | ICD-10-CM | POA: Diagnosis not present

## 2024-04-25 DIAGNOSIS — I10 Essential (primary) hypertension: Secondary | ICD-10-CM | POA: Diagnosis not present

## 2024-04-25 DIAGNOSIS — Z87891 Personal history of nicotine dependence: Secondary | ICD-10-CM

## 2024-04-25 DIAGNOSIS — Z825 Family history of asthma and other chronic lower respiratory diseases: Secondary | ICD-10-CM | POA: Diagnosis not present

## 2024-04-25 DIAGNOSIS — Y792 Prosthetic and other implants, materials and accessory orthopedic devices associated with adverse incidents: Secondary | ICD-10-CM | POA: Diagnosis present

## 2024-04-25 DIAGNOSIS — M459 Ankylosing spondylitis of unspecified sites in spine: Secondary | ICD-10-CM | POA: Diagnosis present

## 2024-04-25 DIAGNOSIS — Z96611 Presence of right artificial shoulder joint: Principal | ICD-10-CM

## 2024-04-25 DIAGNOSIS — Z8249 Family history of ischemic heart disease and other diseases of the circulatory system: Secondary | ICD-10-CM | POA: Diagnosis not present

## 2024-04-25 DIAGNOSIS — M1990 Unspecified osteoarthritis, unspecified site: Secondary | ICD-10-CM | POA: Diagnosis present

## 2024-04-25 HISTORY — PX: SYNOVIAL BIOPSY: SHX5041

## 2024-04-25 HISTORY — PX: REVISION TOTAL SHOULDER TO REVERSE TOTAL SHOULDER: SHX6313

## 2024-04-25 LAB — TYPE AND SCREEN
ABO/RH(D): O NEG
Antibody Screen: NEGATIVE

## 2024-04-25 SURGERY — REVISION, REVERSE TOTAL ARTHROPLASTY, SHOULDER
Anesthesia: Regional | Site: Shoulder | Laterality: Right

## 2024-04-25 MED ORDER — ROCURONIUM BROMIDE 100 MG/10ML IV SOLN
INTRAVENOUS | Status: DC | PRN
  Administered 2024-04-25: 70 mg via INTRAVENOUS
  Administered 2024-04-25: 10 mg via INTRAVENOUS

## 2024-04-25 MED ORDER — METOCLOPRAMIDE HCL 5 MG/ML IJ SOLN: 5.0000 mg | Freq: Three times a day (TID) | INTRAMUSCULAR | Status: DC | PRN

## 2024-04-25 MED ORDER — PREGABALIN 75 MG PO CAPS
150.0000 mg | ORAL_CAPSULE | Freq: Two times a day (BID) | ORAL | Status: DC
  Administered 2024-04-25 – 2024-04-26 (×2): 150 mg via ORAL
  Filled 2024-04-25 (×2): qty 2

## 2024-04-25 MED ORDER — PROPOFOL 10 MG/ML IV BOLUS
INTRAVENOUS | Status: DC | PRN
  Administered 2024-04-25: 200 mg via INTRAVENOUS

## 2024-04-25 MED ORDER — AMOXICILLIN 500 MG PO CAPS
500.0000 mg | ORAL_CAPSULE | Freq: Two times a day (BID) | ORAL | 0 refills | Status: AC
  Filled 2024-04-25: qty 28, 14d supply, fill #0

## 2024-04-25 MED ORDER — POLYETHYLENE GLYCOL 3350 17 G PO PACK: 17.0000 g | PACK | Freq: Every day | ORAL | Status: DC | PRN

## 2024-04-25 MED ORDER — AMLODIPINE BESYLATE 5 MG PO TABS
5.0000 mg | ORAL_TABLET | Freq: Every day | ORAL | Status: DC
  Administered 2024-04-26: 5 mg via ORAL
  Filled 2024-04-25: qty 1

## 2024-04-25 MED ORDER — DIPHENHYDRAMINE HCL 12.5 MG/5ML PO ELIX: 12.5000 mg | ORAL_SOLUTION | ORAL | Status: DC | PRN

## 2024-04-25 MED ORDER — CEFAZOLIN SODIUM-DEXTROSE 2-4 GM/100ML-% IV SOLN
2.0000 g | Freq: Four times a day (QID) | INTRAVENOUS | Status: AC
  Administered 2024-04-25 (×2): 2 g via INTRAVENOUS
  Filled 2024-04-25 (×2): qty 100

## 2024-04-25 MED ORDER — OXYCODONE HCL 5 MG PO TABS: 5.0000 mg | ORAL_TABLET | Freq: Once | ORAL | Status: DC | PRN

## 2024-04-25 MED ORDER — FENTANYL CITRATE (PF) 100 MCG/2ML IJ SOLN
INTRAMUSCULAR | Status: DC | PRN
  Administered 2024-04-25 (×2): 50 ug via INTRAVENOUS

## 2024-04-25 MED ORDER — ROCURONIUM BROMIDE 10 MG/ML (PF) SYRINGE
PREFILLED_SYRINGE | INTRAVENOUS | Status: AC
  Filled 2024-04-25: qty 10

## 2024-04-25 MED ORDER — LACTATED RINGERS IV SOLN: INTRAVENOUS | Status: DC

## 2024-04-25 MED ORDER — ACETAMINOPHEN 500 MG PO TABS
1000.0000 mg | ORAL_TABLET | Freq: Four times a day (QID) | ORAL | Status: AC
  Administered 2024-04-25 – 2024-04-26 (×4): 1000 mg via ORAL
  Filled 2024-04-25 (×4): qty 2

## 2024-04-25 MED ORDER — DEXAMETHASONE SOD PHOSPHATE PF 10 MG/ML IJ SOLN
INTRAMUSCULAR | Status: DC | PRN
  Administered 2024-04-25: 5 mg via INTRAVENOUS

## 2024-04-25 MED ORDER — SODIUM CHLORIDE 0.9 % IR SOLN
Status: DC | PRN
  Administered 2024-04-25: 1000 mL

## 2024-04-25 MED ORDER — VANCOMYCIN HCL 1 G IV SOLR
INTRAVENOUS | Status: DC | PRN
  Administered 2024-04-25: 1000 mg via TOPICAL

## 2024-04-25 MED ORDER — VENLAFAXINE HCL ER 150 MG PO CP24
150.0000 mg | ORAL_CAPSULE | Freq: Two times a day (BID) | ORAL | Status: DC
  Administered 2024-04-25 – 2024-04-26 (×2): 150 mg via ORAL
  Filled 2024-04-25 (×2): qty 1

## 2024-04-25 MED ORDER — OXYCODONE HCL 5 MG PO TABS
10.0000 mg | ORAL_TABLET | ORAL | Status: DC | PRN
  Administered 2024-04-25 – 2024-04-26 (×3): 10 mg via ORAL
  Filled 2024-04-25 (×4): qty 2

## 2024-04-25 MED ORDER — OXYCODONE HCL 5 MG PO TABS: 5.0000 mg | ORAL_TABLET | ORAL | Status: DC | PRN

## 2024-04-25 MED ORDER — METOCLOPRAMIDE HCL 5 MG PO TABS: 5.0000 mg | ORAL_TABLET | Freq: Three times a day (TID) | ORAL | Status: DC | PRN

## 2024-04-25 MED ORDER — DOCUSATE SODIUM 100 MG PO CAPS
100.0000 mg | ORAL_CAPSULE | Freq: Two times a day (BID) | ORAL | Status: DC
  Administered 2024-04-25 – 2024-04-26 (×3): 100 mg via ORAL
  Filled 2024-04-25 (×3): qty 1

## 2024-04-25 MED ORDER — BISACODYL 10 MG RE SUPP: 10.0000 mg | Freq: Every day | RECTAL | Status: DC | PRN

## 2024-04-25 MED ORDER — 0.9 % SODIUM CHLORIDE (POUR BTL) OPTIME
TOPICAL | Status: DC | PRN
  Administered 2024-04-25: 1000 mL

## 2024-04-25 MED ORDER — METHOCARBAMOL 1000 MG/10ML IJ SOLN: 500.0000 mg | Freq: Four times a day (QID) | INTRAMUSCULAR | Status: DC | PRN

## 2024-04-25 MED ORDER — PHENOL 1.4 % MT LIQD: 1.0000 | OROMUCOSAL | Status: DC | PRN

## 2024-04-25 MED ORDER — CELECOXIB 200 MG PO CAPS
200.0000 mg | ORAL_CAPSULE | Freq: Two times a day (BID) | ORAL | Status: DC
  Administered 2024-04-25 – 2024-04-26 (×3): 200 mg via ORAL
  Filled 2024-04-25 (×3): qty 1

## 2024-04-25 MED ORDER — ASPIRIN 325 MG PO TBEC
325.0000 mg | DELAYED_RELEASE_TABLET | Freq: Every day | ORAL | Status: DC
  Administered 2024-04-25 – 2024-04-26 (×2): 325 mg via ORAL
  Filled 2024-04-25 (×2): qty 1

## 2024-04-25 MED ORDER — ONDANSETRON HCL 4 MG/2ML IJ SOLN
INTRAMUSCULAR | Status: DC | PRN
  Administered 2024-04-25: 4 mg via INTRAVENOUS

## 2024-04-25 MED ORDER — OXYCODONE HCL 5 MG PO TABS
5.0000 mg | ORAL_TABLET | Freq: Four times a day (QID) | ORAL | 0 refills | Status: AC | PRN
  Filled 2024-04-25: qty 30, 5d supply, fill #0

## 2024-04-25 MED ORDER — ONDANSETRON HCL 4 MG PO TABS: 4.0000 mg | ORAL_TABLET | Freq: Four times a day (QID) | ORAL | Status: DC | PRN

## 2024-04-25 MED ORDER — AMPHETAMINE-DEXTROAMPHETAMINE 10 MG PO TABS
20.0000 mg | ORAL_TABLET | Freq: Two times a day (BID) | ORAL | Status: DC
  Administered 2024-04-25 – 2024-04-26 (×3): 20 mg via ORAL
  Filled 2024-04-25 (×3): qty 2

## 2024-04-25 MED ORDER — LIDOCAINE HCL (PF) 2 % IJ SOLN
INTRAMUSCULAR | Status: AC
  Filled 2024-04-25: qty 5

## 2024-04-25 MED ORDER — OXYCODONE HCL 5 MG/5ML PO SOLN: 5.0000 mg | Freq: Once | ORAL | Status: DC | PRN

## 2024-04-25 MED ORDER — CELECOXIB 200 MG PO CAPS
200.0000 mg | ORAL_CAPSULE | Freq: Two times a day (BID) | ORAL | 0 refills | Status: AC
  Filled 2024-04-25: qty 60, 30d supply, fill #0

## 2024-04-25 MED ORDER — FESOTERODINE FUMARATE ER 4 MG PO TB24
4.0000 mg | ORAL_TABLET | Freq: Every day | ORAL | Status: DC
  Administered 2024-04-25 – 2024-04-26 (×2): 4 mg via ORAL
  Filled 2024-04-25 (×2): qty 1

## 2024-04-25 MED ORDER — FENTANYL CITRATE (PF) 50 MCG/ML IJ SOSY
PREFILLED_SYRINGE | INTRAMUSCULAR | Status: AC
  Filled 2024-04-25: qty 2

## 2024-04-25 MED ORDER — FENTANYL CITRATE (PF) 50 MCG/ML IJ SOSY
50.0000 ug | PREFILLED_SYRINGE | INTRAMUSCULAR | Status: DC | PRN
  Administered 2024-04-25: 50 ug via INTRAVENOUS
  Filled 2024-04-25: qty 2

## 2024-04-25 MED ORDER — ORAL CARE MOUTH RINSE: 15.0000 mL | Freq: Once | OROMUCOSAL | Status: AC

## 2024-04-25 MED ORDER — BUPIVACAINE LIPOSOME 1.3 % IJ SUSP
INTRAMUSCULAR | Status: DC | PRN
  Administered 2024-04-25: 10 mL via PERINEURAL

## 2024-04-25 MED ORDER — CHLORHEXIDINE GLUCONATE 4 % EX SOLN
1.0000 | CUTANEOUS | 1 refills | Status: AC
  Filled 2024-04-25: qty 946, 14d supply, fill #0

## 2024-04-25 MED ORDER — FENTANYL CITRATE (PF) 100 MCG/2ML IJ SOLN
INTRAMUSCULAR | Status: AC
  Filled 2024-04-25: qty 2

## 2024-04-25 MED ORDER — CHLORHEXIDINE GLUCONATE 4 % EX SOLN: 1.0000 | CUTANEOUS | 1 refills | Status: DC

## 2024-04-25 MED ORDER — VANCOMYCIN HCL 1000 MG IV SOLR
INTRAVENOUS | Status: AC
  Filled 2024-04-25: qty 20

## 2024-04-25 MED ORDER — MENTHOL 3 MG MT LOZG: 1.0000 | LOZENGE | OROMUCOSAL | Status: DC | PRN

## 2024-04-25 MED ORDER — BUPIVACAINE-EPINEPHRINE (PF) 0.5% -1:200000 IJ SOLN
INTRAMUSCULAR | Status: DC | PRN
  Administered 2024-04-25: 15 mL via PERINEURAL

## 2024-04-25 MED ORDER — CEFAZOLIN SODIUM-DEXTROSE 3-4 GM/150ML-% IV SOLN
3.0000 g | INTRAVENOUS | Status: AC
  Administered 2024-04-25: 3 g via INTRAVENOUS
  Filled 2024-04-25: qty 150

## 2024-04-25 MED ORDER — DROPERIDOL 2.5 MG/ML IJ SOLN: 0.6250 mg | Freq: Once | INTRAMUSCULAR | Status: DC | PRN

## 2024-04-25 MED ORDER — CARIPRAZINE HCL 1.5 MG PO CAPS
3.0000 mg | ORAL_CAPSULE | Freq: Every day | ORAL | Status: DC
  Administered 2024-04-26: 3 mg via ORAL
  Filled 2024-04-25: qty 2

## 2024-04-25 MED ORDER — TRANEXAMIC ACID-NACL 1000-0.7 MG/100ML-% IV SOLN
1000.0000 mg | INTRAVENOUS | Status: AC
  Administered 2024-04-25: 1000 mg via INTRAVENOUS
  Filled 2024-04-25: qty 100

## 2024-04-25 MED ORDER — STERILE WATER FOR IRRIGATION IR SOLN
Status: DC | PRN
  Administered 2024-04-25: 2000 mL

## 2024-04-25 MED ORDER — SUGAMMADEX SODIUM 200 MG/2ML IV SOLN
INTRAVENOUS | Status: DC | PRN
  Administered 2024-04-25: 200 mg via INTRAVENOUS

## 2024-04-25 MED ORDER — ACETAMINOPHEN 500 MG PO TABS
1000.0000 mg | ORAL_TABLET | Freq: Three times a day (TID) | ORAL | 0 refills | Status: AC
  Filled 2024-04-25: qty 84, 14d supply, fill #0

## 2024-04-25 MED ORDER — MAGNESIUM CITRATE PO SOLN: 1.0000 | Freq: Once | ORAL | Status: DC | PRN

## 2024-04-25 MED ORDER — ROSUVASTATIN CALCIUM 20 MG PO TABS
40.0000 mg | ORAL_TABLET | Freq: Every day | ORAL | Status: DC
  Administered 2024-04-26: 40 mg via ORAL
  Filled 2024-04-25: qty 2

## 2024-04-25 MED ORDER — METHOCARBAMOL 500 MG PO TABS
500.0000 mg | ORAL_TABLET | Freq: Four times a day (QID) | ORAL | Status: DC | PRN
  Administered 2024-04-26 (×2): 500 mg via ORAL
  Filled 2024-04-25 (×2): qty 1

## 2024-04-25 MED ORDER — ALPRAZOLAM 0.5 MG PO TABS: 1.0000 mg | ORAL_TABLET | Freq: Three times a day (TID) | ORAL | Status: DC | PRN

## 2024-04-25 MED ORDER — PROPOFOL 10 MG/ML IV BOLUS
INTRAVENOUS | Status: AC
  Filled 2024-04-25: qty 20

## 2024-04-25 MED ORDER — ACETAMINOPHEN 500 MG PO TABS
1000.0000 mg | ORAL_TABLET | Freq: Once | ORAL | Status: AC
  Administered 2024-04-25: 1000 mg via ORAL
  Filled 2024-04-25: qty 2

## 2024-04-25 MED ORDER — BUPIVACAINE HCL (PF) 0.25 % IJ SOLN
INTRAMUSCULAR | Status: AC
  Filled 2024-04-25: qty 30

## 2024-04-25 MED ORDER — ONDANSETRON HCL 4 MG/2ML IJ SOLN: 4.0000 mg | Freq: Four times a day (QID) | INTRAMUSCULAR | Status: DC | PRN

## 2024-04-25 MED ORDER — ONDANSETRON HCL 4 MG/2ML IJ SOLN
INTRAMUSCULAR | Status: AC
  Filled 2024-04-25: qty 2

## 2024-04-25 MED ORDER — SUGAMMADEX SODIUM 200 MG/2ML IV SOLN
INTRAVENOUS | Status: AC
  Filled 2024-04-25: qty 2

## 2024-04-25 MED ORDER — MIDAZOLAM HCL (PF) 2 MG/2ML IJ SOLN
1.0000 mg | INTRAMUSCULAR | Status: DC | PRN
  Administered 2024-04-25: 1 mg via INTRAVENOUS
  Filled 2024-04-25: qty 2

## 2024-04-25 MED ORDER — CHLORHEXIDINE GLUCONATE 0.12 % MT SOLN
15.0000 mL | Freq: Once | OROMUCOSAL | Status: AC
  Administered 2024-04-25: 15 mL via OROMUCOSAL

## 2024-04-25 MED ORDER — LIDOCAINE HCL (CARDIAC) PF 100 MG/5ML IV SOSY
PREFILLED_SYRINGE | INTRAVENOUS | Status: DC | PRN
  Administered 2024-04-25: 20 mg via INTRAVENOUS

## 2024-04-25 MED ORDER — POTASSIUM CHLORIDE 2 MEQ/ML IV SOLN
INTRAVENOUS | Status: AC
  Filled 2024-04-25 (×2): qty 1000

## 2024-04-25 MED ORDER — ASPIRIN 81 MG PO CHEW
81.0000 mg | CHEWABLE_TABLET | Freq: Two times a day (BID) | ORAL | 0 refills | Status: AC
  Filled 2024-04-25: qty 84, 42d supply, fill #0

## 2024-04-25 MED ORDER — HYDROMORPHONE HCL 1 MG/ML IJ SOLN
0.5000 mg | INTRAMUSCULAR | Status: DC | PRN
  Administered 2024-04-25: 0.5 mg via INTRAVENOUS
  Filled 2024-04-25: qty 0.5

## 2024-04-25 MED ORDER — FENTANYL CITRATE (PF) 50 MCG/ML IJ SOSY
25.0000 ug | PREFILLED_SYRINGE | INTRAMUSCULAR | Status: DC | PRN
  Administered 2024-04-25: 50 ug via INTRAVENOUS

## 2024-04-25 MED ORDER — OMEPRAZOLE 20 MG PO CPDR
20.0000 mg | DELAYED_RELEASE_CAPSULE | Freq: Every day | ORAL | 0 refills | Status: AC
  Filled 2024-04-25: qty 30, 30d supply, fill #0

## 2024-04-25 MED ORDER — ONDANSETRON HCL 4 MG PO TABS
4.0000 mg | ORAL_TABLET | Freq: Three times a day (TID) | ORAL | 0 refills | Status: AC | PRN
  Filled 2024-04-25: qty 10, 4d supply, fill #0

## 2024-04-25 SURGICAL SUPPLY — 59 items
BAG COUNTER SPONGE SURGICOUNT (BAG) ×2 IMPLANT
BASEPLATE GLENOID AEQ +6 25 (Joint) IMPLANT
BIT DRILL 3.2 PERIPHERAL SCREW (BIT) IMPLANT
BLADE SAW SGTL 73X25 THK (BLADE) ×2 IMPLANT
CHLORAPREP W/TINT 26 (MISCELLANEOUS) ×4 IMPLANT
CLSR STERI-STRIP ANTIMIC 1/2X4 (GAUZE/BANDAGES/DRESSINGS) ×2 IMPLANT
CNTNR URN SCR LID CUP LEK RST (MISCELLANEOUS) IMPLANT
COOLER ICEMAN CLASSIC (MISCELLANEOUS) ×2 IMPLANT
COVER BACK TABLE 60X90IN (DRAPES) ×2 IMPLANT
COVER SURGICAL LIGHT HANDLE (MISCELLANEOUS) ×2 IMPLANT
DRAPE 3/4 80X56 (DRAPES) ×2 IMPLANT
DRAPE C-ARM 42X120 X-RAY (DRAPES) IMPLANT
DRAPE INCISE IOBAN 66X45 STRL (DRAPES) ×2 IMPLANT
DRAPE POUCH INSTRU U-SHP 10X18 (DRAPES) ×2 IMPLANT
DRAPE SHEET LG 3/4 BI-LAMINATE (DRAPES) ×4 IMPLANT
DRAPE SURG ORHT 6 SPLT 77X108 (DRAPES) ×4 IMPLANT
DRAPE TOP 10253 STERILE (DRAPES) ×2 IMPLANT
DRSG AQUACEL AG ADV 3.5X 6 (GAUZE/BANDAGES/DRESSINGS) ×2 IMPLANT
DRSG AQUACEL AG ADV 3.5X10 (GAUZE/BANDAGES/DRESSINGS) IMPLANT
ELECT BLADE TIP CTD 4 INCH (ELECTRODE) ×2 IMPLANT
ELECT PENCIL ROCKER SW 15FT (MISCELLANEOUS) ×2 IMPLANT
ELECT REM PT RETURN 15FT ADLT (MISCELLANEOUS) ×2 IMPLANT
FACESHIELD WRAPAROUND OR TEAM (MASK) ×6 IMPLANT
FIBER TAPE 2MM (SUTURE) IMPLANT
GAUZE XEROFORM 1X8 LF (GAUZE/BANDAGES/DRESSINGS) IMPLANT
GLOVE BIO SURGEON STRL SZ 6.5 (GLOVE) ×4 IMPLANT
GLOVE BIOGEL PI IND STRL 6.5 (GLOVE) ×2 IMPLANT
GLOVE BIOGEL PI IND STRL 8 (GLOVE) ×2 IMPLANT
GLOVE ECLIPSE 8.0 STRL XLNG CF (GLOVE) ×4 IMPLANT
GOWN STRL REUS W/ TWL LRG LVL3 (GOWN DISPOSABLE) ×4 IMPLANT
GUIDEWIRE GLENOID 2.5X220 (WIRE) IMPLANT
Glenoid lateralized baseplate IMPLANT
HEAD GLENOSP CANN REV SHLD 39 (Head) IMPLANT
INSERT HUM PERF 1/2 39 +6 (Insert) IMPLANT
KIT BASIN OR (CUSTOM PROCEDURE TRAY) ×2 IMPLANT
KIT STABILIZATION SHOULDER (MISCELLANEOUS) ×2 IMPLANT
KIT TURNOVER KIT A (KITS) ×2 IMPLANT
MANIFOLD NEPTUNE II (INSTRUMENTS) ×2 IMPLANT
NS IRRIG 1000ML POUR BTL (IV SOLUTION) ×2 IMPLANT
PACK SHOULDER (CUSTOM PROCEDURE TRAY) ×2 IMPLANT
PAD COLD SHLDR WRAP-ON (PAD) ×2 IMPLANT
RESTRAINT HEAD UNIVERSAL NS (MISCELLANEOUS) ×2 IMPLANT
SCREW 5.0X38 SMALL F/PERFORM (Screw) IMPLANT
SCREW 5.5X14 (Screw) IMPLANT
SCREW 5.5X26 (Screw) IMPLANT
SCREW BONE SMALL REVERSED 15 (Screw) IMPLANT
SET HNDPC FAN SPRY TIP SCT (DISPOSABLE) ×2 IMPLANT
SPONGE T-LAP 4X18 ~~LOC~~+RFID (SPONGE) IMPLANT
SUCTION TUBE FRAZIER 12FR DISP (SUCTIONS) IMPLANT
SUT ETHIBOND 2 V 37 (SUTURE) ×2 IMPLANT
SUT ETHIBOND NAB CT1 #1 30IN (SUTURE) ×2 IMPLANT
SUT ETHILON 3 0 PS 1 (SUTURE) IMPLANT
SUT MNCRL AB 4-0 PS2 18 (SUTURE) ×2 IMPLANT
SUT VIC AB 0 CT1 36 (SUTURE) IMPLANT
SUT VIC AB 3-0 SH 27X BRD (SUTURE) ×2 IMPLANT
SUTURE FIBERWR #5 38 CONV NDL (SUTURE) ×4 IMPLANT
TOWEL OR DSP ST BLU DLX 10/PK (DISPOSABLE) ×2 IMPLANT
TUBE SUCTION HIGH CAP CLEAR NV (SUCTIONS) ×2 IMPLANT
WATER STERILE IRR 1000ML POUR (IV SOLUTION) ×4 IMPLANT

## 2024-04-25 NOTE — Op Note (Signed)
 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  Patient has had 4 previous surgeries with multiple loose baseplate's.  Her BMI, smoking history and her rheumatoid arthritis medications made her surgery much more complicated.  Upon examination of the joint there was no subscapularis or cuff to speak of.  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  I felt that removing this would lead to a significant detriment to the patient.  We spent a great deal of time performing synovectomy to remove metallosis and debris.  There was a retained screw in the glenoid that was removed.  The initial baseplate positions were extremely high and malpositioned.  The screw that was retained was the inferior most screw of the first baseplate.  The position of the screw meant that if we put our center screw just below this that would be appropriately positioned.  We did so.  I wanted to place sutures behind the baseplate to tie into the tuberosity however I was worried that this could interfere with the placement of the glenosphere and the baseplate with a long ingrowth post.  Post-operative plan: The patient will be NWB in sling.  The patient will be will be admitted to observation due to medical complexity, monitoring and pain management.  Patient will be maintained on amoxicillin  for prophylaxis in the setting of possible C acnes infection.  DVT prophylaxis Aspirin  81 mg twice daily for 6  weeks.  Pain control with PRN pain medication preferring oral medicines.  Follow up plan will be scheduled in approximately 7 days for incision check and XR.  Physical therapy to start after 4 weeks.  Implants: Tornier size 2 perform humeral stem retained, +6 retentive polyethylene, 39 standard glenosphere with a 25+6 baseplate, long ingrowth post and 4 peripheral screws  Post-Op Diagnosis: Same Surgeons:Primary: Cristy Bonner DASEN, MD Assistants:Caroline McBane, PA-C Location: [facility] ROOM 09 Anesthesia: General with Exparel  Interscalene Antibiotics: Ancef  3g preop, Vancomycin  1000mg  locally Tourniquet time: None Estimated Blood Loss: 150 Complications: None Specimens: 3 for culture, hold for 2 weeks to rule out C acnes Implants: Implant Name Type Inv. Item Serial No. Manufacturer Lot No. LRB No. Used Action  Glenoid lateralized baseplate   [id number] [employer]  Right 1 Implanted  SCREW BONE SMALL REVERSED 15 - [id number] Screw SCREW BONE SMALL REVERSED 15 [id number] [employer]  Right 1 Implanted  HEAD GLENOSP CANN REV SHLD 39 - [id number] Head HEAD GLENOSP CANN REV SHLD 39 [id number] [employer]  Right 1 Implanted  SCREW 5.0X38 SMALL F/PERFORM - [id number] Screw SCREW 5.0X38 SMALL F/PERFORM  [employer]  Right 2 Implanted  SCREW 5.5X14 - [id number] Screw SCREW 5.5X14  [employer]  Right 1 Implanted  SCREW 5.5X26 - [id number] Screw SCREW 5.5X26  [employer]  Right 1 Implanted  INSERT HUM PERF 1/2 39 +6 - [id number] Insert INSERT HUM PERF 1/2 39 +6 [id number] [employer]  Right 1 Implanted    Indications for Surgery:   Anna Keller is a 53 y.o. female with multiple previous surgery attempts with a  painful shoulder arthroplasty.  Benefits and risks of operative and nonoperative management were discussed prior to surgery with patient/guardian(s) and informed consent form was completed.  Infection and need for further surgery were discussed as was prosthetic stability and cuff issues.   We additionally specifically discussed risks of axillary nerve injury, infection, periprosthetic fracture, continued pain and longevity of implants prior to beginning procedure.      Procedure:   The patient was identified in the preoperative holding area where the surgical site was marked. Block placed by anesthesia with exparel .  The patient was taken to the OR where a procedural timeout was called and the above noted anesthesia was induced.  The patient was positioned beachchair on allen table with spider arm positioner.  Preoperative antibiotics were dosed.  The patient's right shoulder was prepped and draped in the usual sterile fashion.  A second preoperative timeout was called.       We began by using part of the previous deltopectoral incision approximately.  With the skin sharp achieving hemostasis we progressed.  We were able to then identify the previous deltopectoral interval and bluntly dissected through it.  We unable to stay far lateral and dissect directly over the humerus staying hard on bone and peeling soft tissues anteriorly and medially.  I was able to externally rotate the humerus and immediately encountered metalosis  I was able to continue to actually rotate and easily dislocated the head.  The humeral head was removed and spacer was removed.  At that point I was able to continue my synovectomy.  I placed a broach in on the stem and it had appropriate 20 degrees of version and it was extremely well-fixed.  I did not feel that taking it out would be in the patient's best interest.  At this point I placed blunt retractors and was able to expose the glenoid.  The glenoid had significant metallosis debris and this was all removed.  We continued our synovectomy performed a complete synovectomy and sending multiple specimens for biopsy.  There was a screw easily palpable in the glenoid.  I was able to place a trephine around this and remove the screw without issue.  The screws  position based on her CT planning and x-rays demonstrated that it was essentially in the center of the glenoid where our center boss should be placed.  We went slightly inferior and posterior to this and drilled for our center pin.  We reamed concentrically over it tilting her head down slightly.  I then was able to place a boss reamer and selected a long post for maximum ingrowth.  We irrigated copiously at this point.  Once we were happy with our glenoid preparation we selected our +6 baseplate with a long post.  I used some of the autograft in the periphery of the glenoid and packed of the previous trephine hole from the removal of hardware.  Was able to place her baseplate and confirmed multiple times that it was securely down on bone.  4 peripheral screws were placed.  We did initially attempt to place sutures behind the baseplate however I was worried that these would interfere with the baseplate seating.  Once this was complete we placed a 39 standard glenosphere.  We turned our attention back to the humerus.  We placed Prontosan solution.  We then trialed with multiple polyethylene and and found that a 6 worked well.  There was no subscapularis for repair and I worry about instability.  Resected 6  retentive polyethylene.  Is able to reduce the joint.  We irrigated with 3 L of saline and ensured that we had appropriate hemostasis.  Incision was closed in a multilayer fashion fashion with nylon suture.  Sterile dressing was placed.  Patient awoken and taken PACU in stable condition.    Aleck Stalling, PA-C, present and scrubbed throughout the case, critical for completion in a timely fashion, and for retraction, instrumentation, closure.

## 2024-04-25 NOTE — Anesthesia Procedure Notes (Addendum)
 Anesthesia Regional Block: Interscalene brachial plexus block   Pre-Anesthetic Checklist: , timeout performed,  Correct Patient, Correct Site, Correct Laterality,  Correct Procedure, Correct Position, site marked,  Risks and benefits discussed,  Surgical consent,  Pre-op evaluation,  At surgeon's request and post-op pain management  Laterality: Right  Prep: chloraprep       Needles:  Injection technique: Single-shot  Needle Type: Echogenic Needle     Needle Length: 9cm  Needle Gauge: 21     Additional Needles:   Procedures:, nerve stimulator,,, ultrasound used (permanent image in chart),,     Nerve Stimulator or Paresthesia:  Response: deltoid, 0.5 mA  Additional Responses:   Narrative:  Start time: 04/25/2024 8:20 AM End time: 04/25/2024 8:25 AM Injection made incrementally with aspirations every 5 mL.  Performed by: Personally  Anesthesiologist: Epifanio Charleston, MD

## 2024-04-25 NOTE — Progress Notes (Signed)
 Pt is alert and fully oriented x 4, afebrile, stable hemodynamically as document below. Right shoulder incision is dry and clean. Continue cooling compression and arm sling. Pain is well tolerated. Pt is able to rest and sleep well. No major complaint. Plan of care is reviewed. Pt has been progressing. We will continue to monitor.   04/25/24 2058  Vitals  Temp 98.4 F (36.9 C)  Temp Source Oral  BP 128/85  MAP (mmHg) 98  BP Location Left Arm  BP Method Automatic  Patient Position (if appropriate) Lying  Pulse Rate 97  Pulse Rate Source Monitor  Resp 16  Level of Consciousness  Level of Consciousness Alert  MEWS COLOR  MEWS Score Color Green  Oxygen Therapy  SpO2 94 %  O2 Device Nasal Cannula  O2 Flow Rate (L/min) 3 L/min   Wendi Dash, RN

## 2024-04-25 NOTE — Transfer of Care (Signed)
 Immediate Anesthesia Transfer of Care Note  Patient: Anna Keller  Procedure(s) Performed: REVISION, REVERSE TOTAL ARTHROPLASTY, SHOULDER (Right: Shoulder) BIOPSY, SYNOVIUM (Right)  Patient Location: PACU  Anesthesia Type:General  Level of Consciousness: awake and alert   Airway & Oxygen Therapy: Patient Spontanous Breathing and Patient connected to face mask oxygen  Post-op Assessment: Report given to RN and Post -op Vital signs reviewed and stable  Post vital signs: Reviewed  Last Vitals:  Vitals Value Taken Time  BP 160/105 04/25/24 11:19  Temp    Pulse 101 04/25/24 11:21  Resp 15 04/25/24 11:12  SpO2 93 % 04/25/24 11:21  Vitals shown include unfiled device data.  Last Pain:  Vitals:   04/25/24 0755  TempSrc:   PainSc: 7       Patients Stated Pain Goal: 5 (04/25/24 0747)  Complications: No notable events documented.

## 2024-04-25 NOTE — Anesthesia Preprocedure Evaluation (Addendum)
 "                                  Anesthesia Evaluation  Patient identified by MRN, date of birth, ID band Patient awake    Reviewed: Allergy & Precautions, NPO status , Patient's Chart, lab work & pertinent test results  History of Anesthesia Complications Negative for: history of anesthetic complications  Airway Mallampati: III  TM Distance: >3 FB Neck ROM: Full    Dental no notable dental hx. (+) Dental Advisory Given   Pulmonary former smoker Quit smoking 2017, 25 pack year history  Snores at night, has never had sleep study    breath sounds clear to auscultation       Cardiovascular hypertension,  Rhythm:Regular     Neuro/Psych  Headaches PSYCHIATRIC DISORDERS Anxiety Depression       GI/Hepatic Neg liver ROS,GERD  Medicated and Controlled,,  Endo/Other    Class 3 obesityBMI 45  Renal/GU negative Renal ROS     Musculoskeletal  (+) Arthritis , Rheumatoid disorders,  Ankylosing spondylitis    Abdominal   Peds  Hematology Lab Results      Component                Value               Date                      WBC                      6.1                 07/22/2022                HGB                      12.9                07/22/2022                HCT                      40.5                07/22/2022                MCV                      89.8                07/22/2022                PLT                      304                 07/22/2022              Anesthesia Other Findings   Reproductive/Obstetrics S/p hysterectomy                               Anesthesia Physical Anesthesia Plan  ASA: 3  Anesthesia Plan: General and Regional   Post-op Pain Management: Regional block*, Tylenol  PO (pre-op)* and Toradol  IV (intra-op)*   Induction:  Intravenous  PONV Risk Score and Plan: 3 and Ondansetron , Dexamethasone , Midazolam  and Treatment may vary due to age or medical condition  Airway Management Planned: Oral  ETT  Additional Equipment: None  Intra-op Plan:   Post-operative Plan: Extubation in OR  Informed Consent: I have reviewed the patients History and Physical, chart, labs and discussed the procedure including the risks, benefits and alternatives for the proposed anesthesia with the patient or authorized representative who has indicated his/her understanding and acceptance.     Dental advisory given  Plan Discussed with: CRNA  Anesthesia Plan Comments:          Anesthesia Quick Evaluation  "

## 2024-04-25 NOTE — Anesthesia Procedure Notes (Addendum)
 Procedure Name: Intubation Date/Time: 04/25/2024 9:29 AM  Performed by: Pam Macario BROCKS, CRNAPre-anesthesia Checklist: Patient identified, Emergency Drugs available, Suction available, Patient being monitored and Timeout performed Patient Re-evaluated:Patient Re-evaluated prior to induction Oxygen Delivery Method: Circle system utilized Preoxygenation: Pre-oxygenation with 100% oxygen Induction Type: IV induction Ventilation: Mask ventilation without difficulty Laryngoscope Size: Mac and 4 Grade View: Grade III Tube type: Oral Tube size: 7.0 mm Number of attempts: 1 Airway Equipment and Method: Stylet and Oral airway Placement Confirmation: ETT inserted through vocal cords under direct vision, positive ETCO2, breath sounds checked- equal and bilateral and CO2 detector Secured at: 23 cm Tube secured with: Tape Dental Injury: Teeth and Oropharynx as per pre-operative assessment

## 2024-04-25 NOTE — Anesthesia Postprocedure Evaluation (Signed)
"   Anesthesia Post Note  Patient: Anna Keller  Procedure(s) Performed: REVISION, REVERSE TOTAL ARTHROPLASTY, SHOULDER (Right: Shoulder) BIOPSY, SYNOVIUM (Right)     Patient location during evaluation: PACU Anesthesia Type: General Level of consciousness: awake and alert Pain management: pain level controlled Vital Signs Assessment: post-procedure vital signs reviewed and stable Respiratory status: spontaneous breathing, nonlabored ventilation, respiratory function stable and patient connected to nasal cannula oxygen Cardiovascular status: blood pressure returned to baseline and stable Postop Assessment: no apparent nausea or vomiting Anesthetic complications: no   No notable events documented.  Last Vitals:  Vitals:   04/25/24 1200 04/25/24 1212  BP: (!) 147/87 (!) 141/97  Pulse: (!) 103 99  Resp: 16 17  Temp:    SpO2: 93% 92%    Last Pain:  Vitals:   04/25/24 1212  TempSrc:   PainSc: Anna Keller      "

## 2024-04-25 NOTE — Discharge Instructions (Addendum)
 Bonner Hair MD, MPH Aleck Stalling, PA-C [facility] [address] [phone] (tel)   [phone] (fax)   POST-OPERATIVE INSTRUCTIONS - TOTAL SHOULDER REPLACEMENT    WOUND CARE You may leave the operative dressing in place until your follow-up appointment. KEEP THE INCISIONS CLEAN AND DRY. There may be a small amount of fluid/bleeding leaking at the surgical site. This is normal after surgery.  If it fills with liquid or blood please call us  immediately to change it for you. Use the provided ice machine or Ice packs as often as possible for the first 3-4 days, then as needed for pain relief.   Keep a layer of cloth or a shirt between your skin and the cooling unit to prevent frost bite as it can get very cold.  SHOWERING: - You may shower on Post-Op Day #2.  - The dressing is water  resistant but do not scrub it as it may start to peel up.   - You may remove the sling for showering - Gently pat the area dry.  - Do not soak the shoulder in water .  - Do not go swimming in the pool or ocean until your incision has completely healed (about 4-6 weeks after surgery) - KEEP THE INCISIONS CLEAN AND DRY.  EXERCISES Wear the sling at all times  You may remove the sling for showering, but keep the arm across the chest or in a secondary sling.    Accidental/Purposeful External Rotation and shoulder flexion (reaching behind you) is to be avoided at all costs for the first month. It is ok to come out of your sling if your are sitting and have assistance for eating.   Do not lift anything heavier than 1 pound until we discuss it further in clinic.  It is normal for your fingers/hand to become more swollen after surgery and discolored from bruising.   This will resolve over the first few weeks usually after surgery. Please continue to ambulate and do not stay sitting or lying for too long.  Perform foot and wrist pumps to assist in circulation.  PHYSICAL  THERAPY - No therapy for 4 weeks  REGIONAL ANESTHESIA (NERVE BLOCKS) The anesthesia team may have performed a nerve block for you this is a great tool used to minimize pain.   The block may start wearing off overnight (between 8-24 hours postop) When the block wears off, your pain may go from nearly zero to the pain you would have had postop without the block. This is an abrupt transition but nothing dangerous is happening.   This can be a challenging period but utilize your as needed pain medications to try and manage this period. We suggest you use the pain medication the first night prior to going to bed, to ease this transition.  You may take an extra dose of narcotic when this happens if needed   POST-OP MEDICATIONS- Multimodal approach to pain control In general your pain will be controlled with a combination of substances.  Prescriptions unless otherwise discussed are electronically sent to your pharmacy.  This is a carefully made plan we use to minimize narcotic use.     Celebrex  - Anti-inflammatory medication taken on a scheduled basis Acetaminophen  - Non-narcotic pain medicine taken on a scheduled basis  Oxycodone  - This is a strong narcotic, to be used only on an as needed basis for SEVERE pain. Aspirin  81mg  - This medicine is used to minimize the risk of blood clots after surgery.  Omeprazole  - daily medicine to protect your stomach while taking anti-inflammatories.   Zofran  -  take as needed for nausea Amoxicillin  - this is an antibiotic, take as instructed   FOLLOW-UP If you develop a Fever (>101.5), Redness or Drainage from the surgical incision site, please call our office to arrange for an evaluation. Please call the office to schedule a follow-up appointment for a wound check, 7-10 days post-operatively.  IF YOU HAVE ANY QUESTIONS, PLEASE FEEL FREE TO CALL OUR OFFICE.  HELPFUL INFORMATION  Your arm will be in a sling following surgery. You will be in this sling for  the next 4 weeks.   You may be more comfortable sleeping in a semi-seated position the first few nights following surgery.  Keep a pillow propped under the elbow and forearm for comfort.  If you have a recliner type of chair it might be beneficial.  If not that is fine too, but it would be helpful to sleep propped up with pillows behind your operated shoulder as well under your elbow and forearm.  This will reduce pulling on the suture lines.  When dressing, put your operative arm in the sleeve first.  When getting undressed, take your operative arm out last.  Loose fitting, button-down shirts are recommended.  In most states it is against the law to drive while your arm is in a sling. And certainly against the law to drive while taking narcotics.  You may return to work/school in the next couple of days when you feel up to it. Desk work and typing in the sling is fine.  We suggest you use the pain medication the first night prior to going to bed, in order to ease any pain when the anesthesia wears off. You should avoid taking pain medications on an empty stomach as it will make you nauseous.  You should wean off your narcotic medicines as soon as you are able.     Most patients will be off narcotics before their first postop appointment.    We do not refill narcotics  Do not drink alcoholic beverages or take illicit drugs when taking pain medications.  Pain medication may make you constipated.  Below are a few solutions to try in this order: Decrease the amount of pain medication if you arent having pain. Drink lots of decaffeinated fluids. Drink prune juice and/or each dried prunes  If the first 3 dont work start with additional solutions Take Colace - an over-the-counter stool softener Take Senokot - an over-the-counter laxative Take Miralax  - a stronger over-the-counter laxative   Dental Antibiotics:  We require dental prophylaxis for 2 years after a shoulder  replacement  Contact your surgeon for an antibiotic prescription, prior to your dental procedure.   For more information including helpful videos and documents visit our website:   [email or web address]

## 2024-04-25 NOTE — Interval H&P Note (Signed)
 All questions answered

## 2024-04-26 NOTE — Progress Notes (Signed)
" ° ° ° °  Subjective:  Patient reports pain as moderate.  Patient is postop day 1 from revision of right total shoulder arthroplasty.  She reports she is doing well.  She has pain but is tolerable for her.  Her block is not completely worn off yet.  Yesterday's total administered Morphine Milligram Equivalents: 85   Objective:   VITALS:   Vitals:   04/26/24 0152 04/26/24 0511 04/26/24 0803 04/26/24 0923  BP: 121/85 127/86 (!) 141/99 124/79  Pulse: 92 91 89 92  Resp: 16 16 16 16   Temp: 97.9 F (36.6 C) 98.1 F (36.7 C) 98 F (36.7 C) 98.5 F (36.9 C)  TempSrc: Oral Oral Oral Oral  SpO2: 94% 95% 97% 96%  Weight:      Height:        Well-appearing woman in no acute distress.  Mildly decreased sensation over the deltoid musculocutaneous radial ulnar and median nerve distributions.  Intact motor function in the AIN, PIN and ulnar nerves.  Clean dressing with minimal spotting.  Lab Results  Component Value Date   WBC 7.6 04/05/2024   HGB 14.2 04/05/2024   HCT 43.5 04/05/2024   MCV 93 04/05/2024   PLT 341 04/05/2024   BMET    Component Value Date/Time   NA 141 04/05/2024 1401   K 4.7 04/05/2024 1401   CL 102 04/05/2024 1401   CO2 21 04/05/2024 1401   GLUCOSE 104 (H) 04/05/2024 1401   GLUCOSE 163 (H) 04/02/2022 0335   BUN 21 04/05/2024 1401   CREATININE 0.69 04/05/2024 1401   CREATININE 0.64 07/14/2018 0921   CALCIUM  9.7 04/05/2024 1401   EGFR 103 04/05/2024 1401   GFRNONAA >60 04/02/2022 0335        Assessment/Plan: 1 Day Post-Op   Principal Problem:   Status post reverse total arthroplasty of right shoulder   The patient is doing well after her revision total shoulder arthroplasty.  Will mobilize her today.  Pain is controlled.  If.  Pain continues to be tolerable, she can be discharged to home later on today to follow-up with Dr. Cristy in the office as previously scheduled.   Cordella SHAUNNA Rhein 04/26/2024, 11:48 AM   Cordella Rhein, MD, MS [facility] Specialist / [place] [phone]   Contact information:   Tzzxijbd 7am-5pm epic message Dr. Rhein, or call office for patient follow up: [phone] After hours and holidays please check [email or web address] for group call information for [facility]   "

## 2024-04-26 NOTE — Discharge Summary (Signed)
 The patient was admitted for revision right shoulder arthroplasty.  Please see Dr. Raguel note for more details on the surgical procedure.  Following surgery she was transferred to the recovery into the floor.  Pain is controlled oral and IV agents.  She did have a block which wore off over time.  She was cleared for discharge to home on the first day after surgery.

## 2024-04-26 NOTE — Progress Notes (Signed)
 Discharge orders and medications placed by Cordella Rhein, MD. Discharge medications sent to [facility] community pharmacy. Pharmacy closed today due to holiday. Provider notified and per provider medications have been sent to [facility] on [address], pt notified and verbalized understanding. Discharge instructions and medications reviewed with pt. PIV removed. Pt is waiting for ride at this time.

## 2024-04-27 ENCOUNTER — Other Ambulatory Visit (HOSPITAL_COMMUNITY): Payer: Self-pay

## 2024-04-27 ENCOUNTER — Encounter (HOSPITAL_COMMUNITY): Payer: Self-pay | Admitting: Orthopaedic Surgery

## 2024-04-30 ENCOUNTER — Ambulatory Visit: Admitting: Physical Medicine and Rehabilitation

## 2024-05-01 ENCOUNTER — Other Ambulatory Visit: Payer: Self-pay

## 2024-05-01 ENCOUNTER — Encounter: Payer: Self-pay | Admitting: Family Medicine

## 2024-05-01 DIAGNOSIS — F900 Attention-deficit hyperactivity disorder, predominantly inattentive type: Secondary | ICD-10-CM

## 2024-05-01 MED ORDER — AMPHETAMINE-DEXTROAMPHETAMINE 20 MG PO TABS: 20.0000 mg | ORAL_TABLET | Freq: Two times a day (BID) | ORAL | 0 refills | Status: DC

## 2024-05-08 ENCOUNTER — Ambulatory Visit (INDEPENDENT_AMBULATORY_CARE_PROVIDER_SITE_OTHER): Admitting: Physician Assistant

## 2024-05-08 VITALS — BP 119/79 | HR 93 | Ht 66.0 in | Wt 300.0 lb

## 2024-05-08 DIAGNOSIS — N3281 Overactive bladder: Secondary | ICD-10-CM

## 2024-05-08 LAB — URINALYSIS, COMPLETE
Bilirubin, UA: NEGATIVE
Glucose, UA: NEGATIVE
Nitrite, UA: NEGATIVE
Protein,UA: NEGATIVE
RBC, UA: NEGATIVE
Specific Gravity, UA: 1.03 (ref 1.005–1.030)
Urobilinogen, Ur: 1 mg/dL (ref 0.2–1.0)
pH, UA: 5.5 (ref 5.0–7.5)

## 2024-05-08 LAB — MICROSCOPIC EXAMINATION: Epithelial Cells (non renal): >10 /HPF — AB (ref 0–10)

## 2024-05-08 MED ORDER — SOLIFENACIN SUCCINATE 10 MG PO TABS: 10.0000 mg | ORAL_TABLET | Freq: Every day | ORAL | 11 refills | Status: AC

## 2024-05-08 NOTE — Progress Notes (Signed)
 "  05/08/2024 1:50 PM   Anna Keller 1970/06/01 [id number]  CC: Chief Complaint  Patient presents with   Follow-up   Over Active Bladder   HPI: Anna Keller is a 54 y.o. female with PMH cystocele s/p bladder suspension in 2022 and OAB on solifenacin  10 mg you have previously failed oxybutynin XL, Gemtesa , and Myrbetriq  who presents today for annual follow-up.   Today she reports continued bothersome urgency, frequency, and urge incontinence despite solifenacin .  She uses 3-4 diapers and 3-4 pads daily.  She previously wanted to pursue PTNS, however unfortunately Medicaid will not cover it.  She underwent recent shoulder surgery and is on amoxicillin .  She is asymptomatic of UTI today.  In-office UA today positive for trace ketones and trace leukocytes; urine microscopy with 11-30 WBCs/HPF, >10 epithelial cells/hpf, and moderate bacteria.  PMH: Past Medical History:  Diagnosis Date   ADHD (attention deficit hyperactivity disorder)    Ankylosing spondylitis (HCC)    Anxiety    Arthritis    Depression    Eczema    Family history of adverse reaction to anesthesia    dad would be crazy, talk out of his head-per pt   Headache    Rheumatoid arthritis (HCC)     Surgical History: Past Surgical History:  Procedure Laterality Date   BLADDER SUSPENSION N/A 04/14/2021   Procedure: TRANSVAGINAL TAPE (TVT) PROCEDURE;  Surgeon: Bettina Muskrat, MD;  Location: [facility] OR;  Service: Gynecology;  Laterality: N/A;  GYNCARE TVT EXACT   CYSTOSCOPY N/A 04/14/2021   Procedure: CYSTOSCOPY;  Surgeon: Bettina Muskrat, MD;  Location: [facility] OR;  Service: Gynecology;  Laterality: N/A;   KNEE ARTHROSCOPY Left 1988   REVERSE SHOULDER ARTHROPLASTY Right 01/05/2022   Procedure: RIGHT SHOULDER REVERSE SHOULDER ARTHROPLASTY;  Surgeon: Genelle Standing, MD;  Location: [facility] OR;  Service: Orthopedics;  Laterality: Right;   REVERSE SHOULDER ARTHROPLASTY Right 04/01/2022   Procedure: RIGHT REVERSE SHOULDER  ARTHROPLASTY REVISION;  Surgeon: Genelle Standing, MD;  Location: [facility] OR;  Service: Orthopedics;  Laterality: Right;   REVERSE SHOULDER ARTHROPLASTY Right 07/22/2022   Procedure: RIGHT SHOULDER REVISION  ARTHROPLASTY;  Surgeon: Genelle Standing, MD;  Location: [facility] OR;  Service: Orthopedics;  Laterality: Right;   REVISION TOTAL SHOULDER TO REVERSE TOTAL SHOULDER Right 04/25/2024   Procedure: REVISION, REVERSE TOTAL ARTHROPLASTY, SHOULDER;  Surgeon: Cristy Bonner DASEN, MD;  Location: [facility];  Service: Orthopedics;  Laterality: Right;   SYNOVIAL BIOPSY Right 04/25/2024   Procedure: BIOPSY, SYNOVIUM;  Surgeon: Cristy Bonner DASEN, MD;  Location: [facility];  Service: Orthopedics;  Laterality: Right;   TONSILLECTOMY     as a child   TOTAL LAPAROSCOPIC HYSTERECTOMY WITH SALPINGECTOMY Bilateral 04/14/2021   Procedure: TOTAL LAPAROSCOPIC HYSTERECTOMY WITH BILATERAL SALPINGECTOMY AND BILATERAL OOPHERECTOMY;  Surgeon: Bettina Muskrat, MD;  Location: [facility] OR;  Service: Gynecology;  Laterality: Bilateral;    Home Medications:  Allergies as of 05/08/2024       Reactions   Codeine Nausea And Vomiting        Medication List        Accurate as of May 08, 2024  1:50 PM. If you have any questions, ask your nurse or doctor.          acetaminophen  500 MG tablet Commonly known as: TYLENOL  Take 2 tablets (1,000 mg total) by mouth every 8 (eight) hours for 14 days.   ALPRAZolam  1 MG tablet Commonly known as: XANAX  Take 1 tablet (1 mg total) by mouth 3 (three) times daily as  needed for anxiety.   amLODipine  5 MG tablet Commonly known as: NORVASC  Take 1 tablet (5 mg total) by mouth daily.   amoxicillin  500 MG capsule Commonly known as: AMOXIL  Take 1 capsule (500 mg total) by mouth 2 (two) times daily for 14 days.   amphetamine -dextroamphetamine  20 MG tablet Commonly known as: ADDERALL Take 1 tablet (20 mg total) by mouth 2 (two) times daily.   aspirin  81 MG chewable tablet Commonly known as: Aspirin   Childrens Chew 1 tablet (81 mg total) by mouth 2 (two) times daily. For 6 weeks for DVT prophylaxis after surgery   cariprazine  3 MG capsule Commonly known as: Vraylar  Take 1 capsule (3 mg total) by mouth daily.   celecoxib  200 MG capsule Commonly known as: CeleBREX  Take 1 capsule (200 mg total) by mouth 2 (two) times daily.   chlorhexidine  4 % external liquid Commonly known as: HIBICLENS  Apply 15 mLs (1 Application total) topically as directed for 30 doses. Use as directed daily for 5 days every other week for 6 weeks.   clobetasol  ointment 0.05 % Commonly known as: TEMOVATE  Apply topically 2 (two) times daily. What changed:  how much to take when to take this reasons to take this   doxepin  50 MG capsule Commonly known as: SINEQUAN  Take 1 capsule (50 mg total) by mouth at bedtime.   levocetirizine 5 MG tablet Commonly known as: XYZAL  Take 5 mg by mouth daily.   multivitamin with minerals tablet Take 1 tablet by mouth daily.   omeprazole  20 MG capsule Commonly known as: PRILOSEC Take 1 capsule (20 mg total) by mouth daily. For gastric protection while taking NSAIDs   polyethylene glycol powder 17 GM/SCOOP powder Commonly known as: GLYCOLAX /MIRALAX  Take 17 g by mouth daily.   pregabalin  150 MG capsule Commonly known as: LYRICA  Take 150 mg by mouth 2 (two) times daily.   rosuvastatin  40 MG tablet Commonly known as: CRESTOR  Take 1 tablet (40 mg total) by mouth daily.   solifenacin  10 MG tablet Commonly known as: VESICARE  Take 1 tablet (10 mg total) by mouth daily.   venlafaxine  XR 150 MG 24 hr capsule Commonly known as: EFFEXOR -XR Take 1 capsule (150 mg total) by mouth 2 (two) times daily.        Allergies:  Allergies[1]  Family History: Family History  Problem Relation Age of Onset   Hypertension Mother    Arthritis Father    Heart disease Sister    Pulmonary Hypertension Sister    COPD Sister    Diabetes Sister    Breast cancer Maternal  Grandmother    Heart attack Paternal Grandmother    Breast cancer Maternal Aunt    Breast cancer Cousin     Social History:   reports that she quit smoking about 8 years ago. Her smoking use included cigarettes. She started smoking about 33 years ago. She has a 25 pack-year smoking history. She has never been exposed to tobacco smoke. She has never used smokeless tobacco. She reports that she does not currently use alcohol. She reports that she does not use drugs.  Physical Exam: BP 119/79   Pulse 93   Ht 5' 6 (1.676 m)   Wt 300 lb (136.1 kg)   LMP 03/24/2021   BMI 48.42 kg/m   Constitutional:  Alert and oriented, no acute distress, nontoxic appearing HEENT: [place], AT Cardiovascular: No clubbing, cyanosis, or edema Respiratory: Normal respiratory effort, no increased work of breathing Skin: No rashes, bruises or suspicious lesions Neurologic:  Grossly intact, no focal deficits, moving all 4 extremities Psychiatric: Normal mood and affect  Laboratory Data: Results for orders placed or performed in visit on 05/08/24  Microscopic Examination   Collection Time: 05/08/24  1:11 PM   Urine  Result Value Ref Range   WBC, UA 11-30 (A) 0 - 5 /hpf   RBC, Urine 0-2 0 - 2 /hpf   Epithelial Cells (non renal) >10 (A) 0 - 10 /hpf   Mucus, UA Present (A) Not Estab.   Bacteria, UA Moderate (A) None seen/Few  Urinalysis, Complete   Collection Time: 05/08/24  1:11 PM  Result Value Ref Range   Specific Gravity, UA 1.030 1.005 - 1.030   pH, UA 5.5 5.0 - 7.5   Color, UA Yellow Yellow   Appearance Ur Slightly cloudy Clear   Leukocytes,UA Trace (A) Negative   Protein,UA Negative Negative/Trace   Glucose, UA Negative Negative   Ketones, UA Trace (A) Negative   RBC, UA Negative Negative   Bilirubin, UA Negative Negative   Urobilinogen, Ur 1.0 0.2 - 1.0 mg/dL   Nitrite, UA Negative Negative   Microscopic Examination See below:    Assessment & Plan:   1. Overactive bladder (Primary) Poor  symptom control on Vesicare  10 mg.  Unfortunately, Medicaid will not cover PTNS.  We discussed Botox and InterStim as alternatives and she is interested in either.  Will have her meet with Dr. MacDiarmid to discuss further.  UA contaminated with epithelial cells, low suspicion for UTI.  She is not clinically infected today. - Urinalysis, Complete - solifenacin  (VESICARE ) 10 MG tablet; Take 1 tablet (10 mg total) by mouth daily.  Dispense: 30 tablet; Refill: 11  Return for OAB consult with Dr. Gaston.  Lucie Hones, PA-C  [facility] [place] [address] [place], [place] [address] [phone]      [1]  Allergies Allergen Reactions   Codeine Nausea And Vomiting   "

## 2024-05-09 LAB — AEROBIC/ANAEROBIC CULTURE W GRAM STAIN (SURGICAL/DEEP WOUND)
Culture: NO GROWTH
Culture: NO GROWTH
Culture: NO GROWTH
Gram Stain: NONE SEEN
Gram Stain: NONE SEEN
Gram Stain: NONE SEEN

## 2024-05-10 ENCOUNTER — Other Ambulatory Visit: Payer: Self-pay | Admitting: Family Medicine

## 2024-05-10 DIAGNOSIS — F900 Attention-deficit hyperactivity disorder, predominantly inattentive type: Secondary | ICD-10-CM

## 2024-05-10 MED ORDER — AMPHETAMINE-DEXTROAMPHETAMINE 20 MG PO TABS: 20.0000 mg | ORAL_TABLET | Freq: Two times a day (BID) | ORAL | 0 refills | Status: AC

## 2024-05-10 NOTE — Discharge Summary (Signed)
 "  Patient ID: Anna Keller MRN: [id number] DOB/AGE: July 20, 1970 54 y.o.  Admit date: 04/25/2024 Discharge date: 05/10/2024  Admission Diagnoses:Failed R shoulder arthroplasty  Discharge Diagnoses:  Principal Problem:   Status post reverse total arthroplasty of right shoulder   Past Medical History:  Diagnosis Date   ADHD (attention deficit hyperactivity disorder)    Ankylosing spondylitis (HCC)    Anxiety    Arthritis    Depression    Eczema    Family history of adverse reaction to anesthesia    dad would be crazy, talk out of his head-per pt   Headache    Rheumatoid arthritis (HCC)      Procedures Performed: revision total shoulder arthroplasty  Discharged Condition: good  Hospital Course: Patient brought in as for surgery.  Tolerated procedure well.  Was kept for monitoring overnight for pain control and medical monitoring postop and was found to be stable for DC home the morning after surgery.  Patient was instructed on specific activity restrictions and all questions were answered.   Opiates Today (MME): Today's  total administered Morphine Milligram Equivalents: 0 Opiates Yesterday (MME): Yesterday's total administered Morphine Milligram Equivalents: 0 Opiates Used in the last two days:  Inpatient Morphine Milligram Equivalents Per Day 04/25/24 - 04/26/24   Values displayed are in units of MME/Day    Order Start / End Date 04/25/24 04/26/24    oxyCODONE  (Oxy IR/ROXICODONE ) immediate release tablet 5 mg 04/25/24 - 04/25/24 0 of Unknown --    oxyCODONE  (ROXICODONE ) 5 MG/5ML solution 5 mg 04/25/24 - 04/25/24 0 of Unknown --      Group total: 0 of Unknown     fentaNYL  (SUBLIMAZE ) injection 50-100 mcg 04/25/24 - 04/25/24 15 of Unknown --    fentaNYL  (SUBLIMAZE ) injection 25-50 mcg 04/25/24 - 04/25/24 15 of 45-90 --    HYDROmorphone  (DILAUDID ) injection 0.5 mg 04/25/24 - 04/26/24 10 of 40 0 of 40    oxyCODONE  (Oxy IR/ROXICODONE ) immediate release tablet 5 mg  04/25/24 - 04/26/24 0 of 30 0 of 30    oxyCODONE  (Oxy IR/ROXICODONE ) immediate release tablet 10 mg 04/25/24 - 04/26/24 15 of 60 30 of 60    fentaNYL  (SUBLIMAZE ) injection 04/25/24 - 04/25/24 *30 of 30 --    Daily Totals  * 85 of Unknown (at least 205-250) 30 of 130  *One-Step medication  Calculation Errors     Order Type Date Details   fentaNYL  (SUBLIMAZE ) injection 50-100 mcg Ordered Dose -- Insufficient frequency information   oxyCODONE  (Oxy IR/ROXICODONE ) immediate release tablet 5 mg Ordered Dose -- Insufficient frequency information   oxyCODONE  (ROXICODONE ) 5 MG/5ML solution 5 mg Ordered Dose -- Insufficient frequency information            Consults: None  Significant Diagnostic Studies: No additional pertinent studies  Treatments: Surgery  Discharge Exam:  Dressing CDI and sling well fitting,  full and painless ROM throughout hand with DPC of 0.  Axillary nerve sensation/motor altered in setting of block and unable to be fully tested.  Distal motor and sensory altered in setting of block.   Disposition: Discharge disposition: 01-Home or Self Care       Discharge Instructions     Call MD for:  redness, tenderness, or signs of infection (pain, swelling, redness, odor or green/yellow discharge around incision site)   Complete by: As directed    Call MD for:  severe uncontrolled pain   Complete by: As directed    Call MD for:  temperature >100.4  Complete by: As directed    Diet - low sodium heart healthy   Complete by: As directed       Allergies as of 04/26/2024       Reactions   Codeine Nausea And Vomiting        Medication List     TAKE these medications    ALPRAZolam  1 MG tablet Commonly known as: XANAX  Take 1 tablet (1 mg total) by mouth 3 (three) times daily as needed for anxiety.   amLODipine  5 MG tablet Commonly known as: NORVASC  Take 1 tablet (5 mg total) by mouth daily.   aspirin  81 MG chewable tablet Commonly known as: Aspirin   Childrens Chew 1 tablet (81 mg total) by mouth 2 (two) times daily. For 6 weeks for DVT prophylaxis after surgery   cariprazine  3 MG capsule Commonly known as: Vraylar  Take 1 capsule (3 mg total) by mouth daily.   celecoxib  200 MG capsule Commonly known as: CeleBREX  Take 1 capsule (200 mg total) by mouth 2 (two) times daily. What changed:  medication strength how much to take   chlorhexidine  4 % external liquid Commonly known as: HIBICLENS  Apply 15 mLs (1 Application total) topically as directed for 30 doses. Use as directed daily for 5 days every other week for 6 weeks.   clobetasol  ointment 0.05 % Commonly known as: TEMOVATE  Apply topically 2 (two) times daily. What changed:  how much to take when to take this reasons to take this   doxepin  50 MG capsule Commonly known as: SINEQUAN  Take 1 capsule (50 mg total) by mouth at bedtime.   levocetirizine 5 MG tablet Commonly known as: XYZAL  Take 5 mg by mouth daily.   multivitamin with minerals tablet Take 1 tablet by mouth daily.   omeprazole  20 MG capsule Commonly known as: PRILOSEC Take 1 capsule (20 mg total) by mouth daily. For gastric protection while taking NSAIDs   polyethylene glycol powder 17 GM/SCOOP powder Commonly known as: GLYCOLAX /MIRALAX  Take 17 g by mouth daily.   pregabalin  150 MG capsule Commonly known as: LYRICA  Take 150 mg by mouth 2 (two) times daily.   rosuvastatin  40 MG tablet Commonly known as: CRESTOR  Take 1 tablet (40 mg total) by mouth daily.   venlafaxine  XR 150 MG 24 hr capsule Commonly known as: EFFEXOR -XR Take 1 capsule (150 mg total) by mouth 2 (two) times daily.       ASK your doctor about these medications    acetaminophen  500 MG tablet Commonly known as: TYLENOL  Take 2 tablets (1,000 mg total) by mouth every 8 (eight) hours for 14 days. Ask about: Should I take this medication?   amoxicillin  500 MG capsule Commonly known as: AMOXIL  Take 1 capsule (500 mg total) by  mouth 2 (two) times daily for 14 days. Ask about: Should I take this medication?   ondansetron  4 MG tablet Commonly known as: Zofran  Take 1 tablet (4 mg total) by mouth every 8 (eight) hours as needed for up to 7 days for nausea or vomiting. Ask about: Should I take this medication?   oxyCODONE  5 MG immediate release tablet Commonly known as: Oxy IR/ROXICODONE  Take 1-2 tablets (5-10 mg total) by mouth every 6 (six) hours as needed for severe pain for up to 5 days. No more than 6 tablets per day. Ask about: Should I take this medication?            "

## 2024-05-14 ENCOUNTER — Ambulatory Visit: Admitting: Urology

## 2024-05-14 ENCOUNTER — Encounter (HOSPITAL_COMMUNITY): Payer: Self-pay | Admitting: Orthopaedic Surgery

## 2024-05-14 VITALS — BP 135/88 | HR 95 | Ht 66.0 in | Wt 300.0 lb

## 2024-05-14 DIAGNOSIS — N3281 Overactive bladder: Secondary | ICD-10-CM | POA: Diagnosis not present

## 2024-05-14 NOTE — Progress Notes (Signed)
 "  05/14/2024 1:20 PM   Anna Keller [birth year] [id number]  Referring provider: No referring provider defined for this encounter.  Chief Complaint  Patient presents with   Over Active Bladder    HPI: ST: Overactive bladder failed oxybutynin Gemtesa  and Myrbetriq .  Had a negative cystoscopy April 2024.  Was given Vesicare .  Did not want Botox and wanted PTNS but not covered by Medicaid.  May 14, 2024 Patient has urge incontinence.  She sometimes leaks with coughing sneezing especially when she is not on Vesicare .  She can leak with bending lifting.  No bedwetting.  She double pads and can soak pads 5-6 times a day  Voids every 2 hours gets up 3-4 times at night.  Flow was good  She has had a hysterectomy.  Vesicare  helps minimally.  No neurologic issues.  Just had rotator cuff surgery.  No history of bladder surgery kidney stones or bladder infections  Pelvic examination a little bit challenging due to body habitus.  She had a deep vaginal vault.  She had a mild cystocele and no stress incontinence with mild hypermobility the bladder neck and a mild cough   PMH: Past Medical History:  Diagnosis Date   ADHD (attention deficit hyperactivity disorder)    Ankylosing spondylitis (HCC)    Anxiety    Arthritis    Depression    Eczema    Family history of adverse reaction to anesthesia    dad would be crazy, talk out of his head-per pt   Headache    Rheumatoid arthritis (HCC)     Surgical History: Past Surgical History:  Procedure Laterality Date   BLADDER SUSPENSION N/A 04/14/2021   Procedure: TRANSVAGINAL TAPE (TVT) PROCEDURE;  Surgeon: Bettina Muskrat, MD;  Location: [facility];  Service: Gynecology;  Laterality: N/A;  GYNCARE TVT EXACT   CYSTOSCOPY N/A 04/14/2021   Procedure: CYSTOSCOPY;  Surgeon: Bettina Muskrat, MD;  Location: [facility] OR;  Service: Gynecology;  Laterality: N/A;   KNEE ARTHROSCOPY Left 1988   REVERSE SHOULDER ARTHROPLASTY Right 01/05/2022   Procedure: RIGHT  SHOULDER REVERSE SHOULDER ARTHROPLASTY;  Surgeon: Genelle Standing, MD;  Location: [facility];  Service: Orthopedics;  Laterality: Right;   REVERSE SHOULDER ARTHROPLASTY Right 04/01/2022   Procedure: RIGHT REVERSE SHOULDER ARTHROPLASTY REVISION;  Surgeon: Genelle Standing, MD;  Location: [facility];  Service: Orthopedics;  Laterality: Right;   REVERSE SHOULDER ARTHROPLASTY Right 07/22/2022   Procedure: RIGHT SHOULDER REVISION  ARTHROPLASTY;  Surgeon: Genelle Standing, MD;  Location: [facility];  Service: Orthopedics;  Laterality: Right;   REVISION TOTAL SHOULDER TO REVERSE TOTAL SHOULDER Right 04/25/2024   Procedure: REVISION, REVERSE TOTAL ARTHROPLASTY, SHOULDER;  Surgeon: Cristy Bonner DASEN, MD;  Location: [facility];  Service: Orthopedics;  Laterality: Right;   SYNOVIAL BIOPSY Right 04/25/2024   Procedure: BIOPSY, SYNOVIUM;  Surgeon: Cristy Bonner DASEN, MD;  Location: [facility];  Service: Orthopedics;  Laterality: Right;   TONSILLECTOMY     as a child   TOTAL LAPAROSCOPIC HYSTERECTOMY WITH SALPINGECTOMY Bilateral 04/14/2021   Procedure: TOTAL LAPAROSCOPIC HYSTERECTOMY WITH BILATERAL SALPINGECTOMY AND BILATERAL OOPHERECTOMY;  Surgeon: Bettina Muskrat, MD;  Location: [facility];  Service: Gynecology;  Laterality: Bilateral;    Home Medications:  Allergies as of 05/14/2024       Reactions   Codeine Nausea And Vomiting        Medication List        Accurate as of May 14, 2024  1:20 PM. If you have any questions, ask your nurse or doctor.  ALPRAZolam  1 MG tablet Commonly known as: XANAX  Take 1 tablet (1 mg total) by mouth 3 (three) times daily as needed for anxiety.   amLODipine  5 MG tablet Commonly known as: NORVASC  Take 1 tablet (5 mg total) by mouth daily.   amphetamine -dextroamphetamine  20 MG tablet Commonly known as: ADDERALL Take 1 tablet (20 mg total) by mouth 2 (two) times daily.   aspirin  81 MG chewable tablet Commonly known as: Aspirin  Childrens Chew 1 tablet (81 mg total) by mouth 2 (two)  times daily. For 6 weeks for DVT prophylaxis after surgery   cariprazine  3 MG capsule Commonly known as: Vraylar  Take 1 capsule (3 mg total) by mouth daily.   celecoxib  200 MG capsule Commonly known as: CeleBREX  Take 1 capsule (200 mg total) by mouth 2 (two) times daily.   chlorhexidine  4 % external liquid Commonly known as: HIBICLENS  Apply 15 mLs (1 Application total) topically as directed for 30 doses. Use as directed daily for 5 days every other week for 6 weeks.   clobetasol  ointment 0.05 % Commonly known as: TEMOVATE  Apply topically 2 (two) times daily. What changed:  how much to take when to take this reasons to take this   doxepin  50 MG capsule Commonly known as: SINEQUAN  Take 1 capsule (50 mg total) by mouth at bedtime.   levocetirizine 5 MG tablet Commonly known as: XYZAL  Take 5 mg by mouth daily.   multivitamin with minerals tablet Take 1 tablet by mouth daily.   omeprazole  20 MG capsule Commonly known as: PRILOSEC Take 1 capsule (20 mg total) by mouth daily. For gastric protection while taking NSAIDs   polyethylene glycol powder 17 GM/SCOOP powder Commonly known as: GLYCOLAX /MIRALAX  Take 17 g by mouth daily.   pregabalin  150 MG capsule Commonly known as: LYRICA  Take 150 mg by mouth 2 (two) times daily.   rosuvastatin  40 MG tablet Commonly known as: CRESTOR  Take 1 tablet (40 mg total) by mouth daily.   solifenacin  10 MG tablet Commonly known as: VESICARE  Take 1 tablet (10 mg total) by mouth daily.   venlafaxine  XR 150 MG 24 hr capsule Commonly known as: EFFEXOR -XR Take 1 capsule (150 mg total) by mouth 2 (two) times daily.        Allergies: Allergies[1]  Family History: Family History  Problem Relation Age of Onset   Hypertension Mother    Arthritis Father    Heart disease Sister    Pulmonary Hypertension Sister    COPD Sister    Diabetes Sister    Breast cancer Maternal Grandmother    Heart attack Paternal Grandmother    Breast  cancer Maternal Aunt    Breast cancer Cousin     Social History:  reports that she quit smoking about 8 years ago. Her smoking use included cigarettes. She started smoking about 33 years ago. She has a 25 pack-year smoking history. She has never been exposed to tobacco smoke. She has never used smokeless tobacco. She reports that she does not currently use alcohol. She reports that she does not use drugs.  ROS:                                        Physical Exam: BP 135/88 (BP Location: Left Arm, Patient Position: Sitting, Cuff Size: Large)   Pulse 95   Ht 5' 6 (1.676 m)   Wt 136.1 kg   LMP 03/24/2021  SpO2 95%   BMI 48.42 kg/m   Constitutional:  Alert and oriented, No acute distress. HEENT: [place] AT, moist mucus membranes.  Trachea midline, no masses.   Laboratory Data: Lab Results  Component Value Date   WBC 7.6 04/05/2024   HGB 14.2 04/05/2024   HCT 43.5 04/05/2024   MCV 93 04/05/2024   PLT 341 04/05/2024    Lab Results  Component Value Date   CREATININE 0.69 04/05/2024    No results found for: PSA  No results found for: TESTOSTERONE  Lab Results  Component Value Date   HGBA1C 6.1 (H) 04/05/2024    Urinalysis    Component Value Date/Time   APPEARANCEUR Slightly cloudy 05/08/2024 1311   GLUCOSEU Negative 05/08/2024 1311   BILIRUBINUR Negative 05/08/2024 1311   PROTEINUR Negative 05/08/2024 1311   NITRITE Negative 05/08/2024 1311   LEUKOCYTESUR Trace (A) 05/08/2024 1311    Pertinent Imaging: Urine reviewed and sent for culture.  Chart reviewed  Assessment & Plan: Patient has mixed incontinence and primarily urge incontinence has failed 4 medications.  Role of urodynamics discussed.  She has had a negative cystoscopy.  Based on body habitus patient would not be able to perform clean intermittent catheterization likely.  InterStim may be a little bit of a issue in the prone position and longer foramen needle would likely be  needed.  Patient has medical comorbidities with obesity which need to be taken to consideration  1. Overactive bladder (Primary)  - Urinalysis, Complete   No follow-ups on file.  Glendia DELENA Elizabeth, MD  [facility] [address], [address] [place], [place] [address] [phone]     [1]  Allergies Allergen Reactions   Codeine Nausea And Vomiting   "

## 2024-05-16 ENCOUNTER — Encounter (HOSPITAL_COMMUNITY): Payer: Self-pay | Admitting: Orthopaedic Surgery

## 2024-05-22 ENCOUNTER — Ambulatory Visit: Payer: Self-pay | Admitting: Family Medicine

## 2024-05-23 ENCOUNTER — Ambulatory Visit

## 2024-05-30 ENCOUNTER — Ambulatory Visit

## 2024-05-30 ENCOUNTER — Ambulatory Visit: Attending: Physician Assistant

## 2024-05-30 DIAGNOSIS — M25511 Pain in right shoulder: Secondary | ICD-10-CM | POA: Insufficient documentation

## 2024-05-30 DIAGNOSIS — M25611 Stiffness of right shoulder, not elsewhere classified: Secondary | ICD-10-CM | POA: Insufficient documentation

## 2024-05-30 DIAGNOSIS — S46011A Strain of muscle(s) and tendon(s) of the rotator cuff of right shoulder, initial encounter: Secondary | ICD-10-CM | POA: Diagnosis present

## 2024-05-30 DIAGNOSIS — M6281 Muscle weakness (generalized): Secondary | ICD-10-CM | POA: Insufficient documentation

## 2024-05-30 NOTE — Therapy (Signed)
 " OUTPATIENT PHYSICAL THERAPY SHOULDER EVALUATION   Patient Name: Anna Keller MRN: [id number] DOB:Jun 18, 1970, 54 y.o., female Today's Date: 05/30/2024  END OF SESSION:  PT End of Session - 05/30/24 1425     Visit Number 1    Number of Visits 17    Date for Recertification  07/25/24    Authorization Type AUTH required    PT Start Time 1426    PT Stop Time 1515    PT Time Calculation (min) 49 min          Past Medical History:  Diagnosis Date   ADHD (attention deficit hyperactivity disorder)    Ankylosing spondylitis (HCC)    Anxiety    Arthritis    Depression    Eczema    Family history of adverse reaction to anesthesia    dad would be crazy, talk out of his head-per pt   Headache    Rheumatoid arthritis (HCC)    Past Surgical History:  Procedure Laterality Date   BLADDER SUSPENSION N/A 04/14/2021   Procedure: TRANSVAGINAL TAPE (TVT) PROCEDURE;  Surgeon: Bettina Muskrat, MD;  Location: [facility] OR;  Service: Gynecology;  Laterality: N/A;  GYNCARE TVT EXACT   CYSTOSCOPY N/A 04/14/2021   Procedure: CYSTOSCOPY;  Surgeon: Bettina Muskrat, MD;  Location: [facility] OR;  Service: Gynecology;  Laterality: N/A;   KNEE ARTHROSCOPY Left 1988   REVERSE SHOULDER ARTHROPLASTY Right 01/05/2022   Procedure: RIGHT SHOULDER REVERSE SHOULDER ARTHROPLASTY;  Surgeon: Genelle Standing, MD;  Location: [facility] OR;  Service: Orthopedics;  Laterality: Right;   REVERSE SHOULDER ARTHROPLASTY Right 04/01/2022   Procedure: RIGHT REVERSE SHOULDER ARTHROPLASTY REVISION;  Surgeon: Genelle Standing, MD;  Location: [facility];  Service: Orthopedics;  Laterality: Right;   REVERSE SHOULDER ARTHROPLASTY Right 07/22/2022   Procedure: RIGHT SHOULDER REVISION  ARTHROPLASTY;  Surgeon: Genelle Standing, MD;  Location: [facility];  Service: Orthopedics;  Laterality: Right;   REVISION TOTAL SHOULDER TO REVERSE TOTAL SHOULDER Right 04/25/2024   Procedure: REVISION, REVERSE TOTAL ARTHROPLASTY, SHOULDER;  Surgeon: Cristy Bonner DASEN, MD;  Location: [facility]  ORS;  Service: Orthopedics;  Laterality: Right;   SYNOVIAL BIOPSY Right 04/25/2024   Procedure: BIOPSY, SYNOVIUM;  Surgeon: Cristy Bonner DASEN, MD;  Location: [facility] ORS;  Service: Orthopedics;  Laterality: Right;   TONSILLECTOMY     as a child   TOTAL LAPAROSCOPIC HYSTERECTOMY WITH SALPINGECTOMY Bilateral 04/14/2021   Procedure: TOTAL LAPAROSCOPIC HYSTERECTOMY WITH BILATERAL SALPINGECTOMY AND BILATERAL OOPHERECTOMY;  Surgeon: Bettina Muskrat, MD;  Location: [facility];  Service: Gynecology;  Laterality: Bilateral;   Patient Active Problem List   Diagnosis Date Noted   Status post reverse total arthroplasty of right shoulder 04/25/2024   Pre-operative clearance 04/05/2024   Moderate episode of recurrent major depressive disorder (HCC) 03/26/2024   Insomnia 03/26/2024   Lymphadenopathy, submandibular 06/20/2023   Painful tongue 12/23/2022   Seronegative rheumatoid arthritis (HCC) 12/20/2022   Metabolic dysfunction-associated steatotic liver disease (MASLD) 12/20/2022   Hyperlipidemia 12/20/2022   ADHD 12/20/2022   Chronic right shoulder pain 11/02/2022   Elevated liver enzymes 11/02/2022   Prediabetes 11/02/2022   Essential hypertension, benign 11/02/2022   Breast cancer screening by mammogram 11/02/2022   Joint inflammation 06/10/2022   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 05/05/2022   History of reverse total replacement of right shoulder joint 04/01/2022   Unilateral primary osteoarthritis, left knee 01/27/2022   Traumatic complete tear of right rotator cuff    S/P laparoscopic hysterectomy 04/14/2021   Grief reaction 03/23/2019   Gastroesophageal  reflux disease without esophagitis 03/23/2019   Constipation 03/23/2019   Eczema 03/23/2019   Pain in joint, multiple sites 07/14/2018   Family history of arthritis 07/14/2018   Peeling skin 07/14/2018   Class 3 severe obesity due to excess calories with body mass index (BMI) of 40.0 to 44.9 in adult [facility]) 07/14/2018   Anxiety  07/14/2018   Depression 07/14/2018    PCP: Glennon Sand, NP   REFERRING PROVIDER: Jennye Aleck SAILOR, PA-C   REFERRING DIAG: [phone] (ICD-10-CM) - Status post reverse total shoulder replacement, right   THERAPY DIAG:  Right shoulder pain, unspecified chronicity  Traumatic complete tear of right rotator cuff, initial encounter  Stiffness of right shoulder, not elsewhere classified  Acute pain of right shoulder  Muscle weakness (generalized)  Rationale for Evaluation and Treatment: Rehabilitation  ONSET DATE: 04/25/24  SUBJECTIVE:                                                                                                                                                                                      SUBJECTIVE STATEMENT: Pt reports pain at top of shoulder capsule. Pt describes the pain as muscle ache pain. Pt rates pain at 4/10 currently.   Hand dominance: Right  PERTINENT HISTORY: Anna Keller is a 54 year old female who presents for PT evaluation. Pt has had 4 failed shoulder arthroplasty surgeries. She has undergone multiple shoulder surgeries, with this being her fifth procedure. The patient reports persistent pain and instability in the shoulder. She has a history of rheumatoid arthritis and has been off her RA medications for over two months in preparation for surgery.   PAIN:  Are you having pain? Yes: NPRS scale: 4/10 Pain location: R shoulder Pain description: Tightness  Aggravating factors: movement Relieving factors: rest  PRECAUTIONS: Other: No lifting >5 pounds; Cannot put arm behind back  RED FLAGS: 5th shoulder surgery; 4 failed total shoulder replacements   WEIGHT BEARING RESTRICTIONS: No  FALLS:  Has patient fallen in last 6 months? No  LIVING ENVIRONMENT: Lives with: lives with their family and lives with their daughter Lives in: House/apartment Stairs: No Has following equipment at home: None  OCCUPATION: Unemployed;  applied for disability  PLOF: Needs assistance with ADLs  PATIENT GOALS:To have no help, brush teeth wash hair independently  NEXT MD VISIT: 2 months  OBJECTIVE:  Note: Objective measures were completed at Evaluation unless otherwise noted.  DIAGNOSTIC FINDINGS:  EXAM:  RIGHT SHOULDER - 1 VIEW   COMPARISON:  09/13/2022   FINDINGS:  Reverse right shoulder arthroplasty in expected alignment. No  periprosthetic lucency or fracture. Recent postsurgical change  includes air and edema in the  joint space and soft tissues.   IMPRESSION:  Reverse right shoulder arthroplasty without immediate postoperative  complication.   PATIENT SURVEYS:  Quick Dash:  QUICK DASH  Please rate your ability do the following activities in the last week by selecting the number below the appropriate response.   Activities Rating  Open a tight or new jar.  5 = Unable  Do heavy household chores (e.g., wash walls, floors). 5 = Unable  Carry a shopping bag or briefcase 3 = Moderate difficulty  Wash your back. 5 = Unable  Use a knife to cut food. 2 = Mild difficulty  Recreational activities in which you take some force or impact through your arm, shoulder or hand (e.g., golf, hammering, tennis, etc.). 5 = Unable  During the past week, to what extent has your arm, shoulder or hand problem interfered with your normal social activities with family, friends, neighbors or groups?  3 = Moderately  During the past week, were you limited in your work or other regular daily activities as a result of your arm, shoulder or hand problem? 4 = Very limited  Rate the severity of the following symptoms in the last week: Arm, Shoulder, or hand pain. 3 = Moderate  Rate the severity of the following symptoms in the last week: Tingling (pins and needles) in your arm, shoulder or hand. 2 = Mild  During the past week, how much difficulty have you had sleeping because of the pain in your arm, shoulder or hand?  3 = Moderate  difficulty   (A QuickDASH score may not be calculated if there is greater than 1 missing item.)  Quick Dash Disability/Symptom Score: [(sum of 40 (n) responses/11 (n)] x 25 = 65.91%  Minimally Clinically Important Difference (MCID): 15-20 points  (Franchignoni, F. et al. (2013). Minimally clinically important difference of the disabilities of the arm, shoulder, and hand outcome measures (DASH) and its shortened version (Quick DASH). Journal of Orthopaedic & Sports Physical Therapy, 44(1), 30-39)   COGNITION: Overall cognitive status: Within functional limits for tasks assessed     SENSATION: WFL   UPPER EXTREMITY ROM:   Active ROM of L Passive ROM of R Right eval Left eval  Shoulder flexion 90 180  Shoulder extension    Shoulder abduction 62 180  Shoulder adduction    Shoulder internal rotation NT 90  Shoulder external rotation 12 83  Elbow flexion    Elbow extension    Wrist flexion    Wrist extension    Wrist ulnar deviation    Wrist radial deviation    Wrist pronation    Wrist supination    (Blank rows = not tested)  UPPER EXTREMITY MMT:  MMT Right eval Left eval  Shoulder flexion    Shoulder extension    Shoulder abduction    Shoulder adduction    Shoulder internal rotation    Shoulder external rotation    Middle trapezius    Lower trapezius    Elbow flexion    Elbow extension    Wrist flexion    Wrist extension    Wrist ulnar deviation    Wrist radial deviation    Wrist pronation    Wrist supination    Grip strength (lbs) 33 40  (Blank rows = not tested)    PALPATION:  Pt demonstrates tenderness to palpate along incision, within L biceps and L brachialis muscles. Cervical musculature does not demonstrate any tenderness or pain during palpation.  TREATMENT DATE: 05/30/2024   Evaluation     PATIENT  EDUCATION: Education details: Home exercise program Person educated: Patient Education method: Explanation, Demonstration, and Handouts Education comprehension: verbalized understanding and needs further education  HOME EXERCISE PROGRAM: Access Code: [email or web address] URL: [email or web address] Date: 05/30/2024 Prepared by: Sidra Simpers  Exercises - Seated Scapular Retraction  - 1 x daily - 7 x weekly - 3 sets - 10 reps - Seated Shoulder Shrugs  - 1 x daily - 7 x weekly - 3 sets - 10 reps - Seated Shoulder Shrug Circles AROM Backward  - 1 x daily - 7 x weekly - 3 sets - 10 reps - Seated Shoulder Flexion Towel Slide at Table Top  - 1 x daily - 7 x weekly - 3 sets - 10 reps  ASSESSMENT:  CLINICAL IMPRESSION: Patient is a 54 y.o. female who was seen today for physical therapy evaluation and treatment for R reverse total shoulder replacement. Pt demonstrates decreased ROM, strength, and coordination with R shoulder. Pt demonstrated TTP with R biceps and brachialis muscles. Educated and discussed home exercise program in order to help increase scapular strength and improve ROM of R UE. Pt will continue to benefit from skilled therapy to address remaining deficits in order to improve overall QoL and return to PLOF.    OBJECTIVE IMPAIRMENTS: decreased coordination, decreased mobility, decreased ROM, decreased strength, and increased fascial restrictions.   ACTIVITY LIMITATIONS: carrying, lifting, sleeping, bathing, dressing, and reach over head  PARTICIPATION LIMITATIONS: meal prep, cleaning, laundry, and yard work  PERSONAL FACTORS: Past/current experiences are also affecting patient's functional outcome.   REHAB POTENTIAL: Fair due to 4 previous R shoulder surgeries  CLINICAL DECISION MAKING: Evolving/moderate complexity  EVALUATION COMPLEXITY: Moderate   GOALS: Goals reviewed with patient? Yes  SHORT TERM GOALS: Target date: 06/13/24  Pt will be independent with HEP in  order to demonstrate increased ability to perform tasks related to occupation/hobbies. Baseline: NA Goal status: INITIAL  LONG TERM GOALS: Target date: 06/30/24   Pt will improve the Quick DASH Questionnaire to be >85% to demonstrate increased functional ability to perform tasks of daily living. Baseline: 65.91% Goal status: INITIAL  2.  Pt will reduce overall pain level to 2/10 by utilizing a combination of stretching, strengthening exercises, and pain-reducing modalities in order to improve overall QoL. Baseline: 4/10 Goal status: INITIAL  3.  Pt will improve R shoulder flexion and abduction to > 100 deg in order to increase functional mobility to complete patient's goal of washing hair. Baseline: 90 deg/ 62 deg Goal status: INITIAL  PLAN:  PT FREQUENCY: 2x/week  PT DURATION: 12 weeks  PLANNED INTERVENTIONS: 97750- Physical Performance Testing, 97110-Therapeutic exercises, 97530- Therapeutic activity, 97112- Neuromuscular re-education, 97535- Self Care, 02859- Manual therapy, 20560 (1-2 muscles), 20561 (3+ muscles)- Dry Needling, Patient/Family education, Joint mobilization, Cryotherapy, and Moist heat  PLAN FOR NEXT SESSION:   HEP review, STM to cervical region & R biceps, PROM of R shoulder    Fonda Simpers, PT, DPT Physical Therapist - [facility]  05/30/24, 3:45 PM  "

## 2024-06-04 ENCOUNTER — Ambulatory Visit

## 2024-06-06 ENCOUNTER — Other Ambulatory Visit (HOSPITAL_COMMUNITY): Payer: Self-pay

## 2024-06-06 ENCOUNTER — Telehealth: Payer: Self-pay | Admitting: Pharmacy Technician

## 2024-06-06 NOTE — Telephone Encounter (Signed)
 Pharmacy Patient Advocate Encounter   Received notification from [facility] that prior authorization for Vraylar  3MG  capsules is required/requested.   Insurance verification completed.   The patient is insured through [employer] [facility] MEDICAID.   Per test claim: PA required; PA submitted to above mentioned insurance via Latent Key/confirmation #/EOC [id number] Status is pending

## 2024-06-06 NOTE — Telephone Encounter (Signed)
 Pharmacy Patient Advocate Encounter  Received notification from [facility] MEDICAID that Prior Authorization for Vraylar  3MG  capsules has been APPROVED from 06/06/2024 to 06/06/2025. Ran test claim, Copay is $4.00. This test claim was processed through [facility]- copay amounts may vary at other pharmacies due to pharmacy/plan contracts, or as the patient moves through the different stages of their insurance plan.   PA #/Case ID/Reference #: [id number]

## 2024-06-07 ENCOUNTER — Ambulatory Visit

## 2024-06-07 DIAGNOSIS — M6281 Muscle weakness (generalized): Secondary | ICD-10-CM

## 2024-06-07 DIAGNOSIS — S46011A Strain of muscle(s) and tendon(s) of the rotator cuff of right shoulder, initial encounter: Secondary | ICD-10-CM

## 2024-06-07 DIAGNOSIS — M25611 Stiffness of right shoulder, not elsewhere classified: Secondary | ICD-10-CM

## 2024-06-07 DIAGNOSIS — M25511 Pain in right shoulder: Secondary | ICD-10-CM

## 2024-06-07 NOTE — Therapy (Signed)
 " OUTPATIENT PHYSICAL THERAPY SHOULDER EVALUATION   Patient Name: Anna Keller MRN: [id number] DOB:09-04-1970, 54 y.o., female Today's Date: 06/07/2024  END OF SESSION:  PT End of Session - 06/07/24 1007     Visit Number 2    Number of Visits 17    PT Start Time 1012    PT Stop Time 1055    PT Time Calculation (min) 43 min           Past Medical History:  Diagnosis Date   ADHD (attention deficit hyperactivity disorder)    Ankylosing spondylitis (HCC)    Anxiety    Arthritis    Depression    Eczema    Family history of adverse reaction to anesthesia    dad would be crazy, talk out of his head-per pt   Headache    Rheumatoid arthritis (HCC)    Past Surgical History:  Procedure Laterality Date   BLADDER SUSPENSION N/A 04/14/2021   Procedure: TRANSVAGINAL TAPE (TVT) PROCEDURE;  Surgeon: Bettina Muskrat, MD;  Location: [facility];  Service: Gynecology;  Laterality: N/A;  GYNCARE TVT EXACT   CYSTOSCOPY N/A 04/14/2021   Procedure: CYSTOSCOPY;  Surgeon: Bettina Muskrat, MD;  Location: [facility];  Service: Gynecology;  Laterality: N/A;   KNEE ARTHROSCOPY Left 1988   REVERSE SHOULDER ARTHROPLASTY Right 01/05/2022   Procedure: RIGHT SHOULDER REVERSE SHOULDER ARTHROPLASTY;  Surgeon: Genelle Standing, MD;  Location: [facility];  Service: Orthopedics;  Laterality: Right;   REVERSE SHOULDER ARTHROPLASTY Right 04/01/2022   Procedure: RIGHT REVERSE SHOULDER ARTHROPLASTY REVISION;  Surgeon: Genelle Standing, MD;  Location: [facility];  Service: Orthopedics;  Laterality: Right;   REVERSE SHOULDER ARTHROPLASTY Right 07/22/2022   Procedure: RIGHT SHOULDER REVISION  ARTHROPLASTY;  Surgeon: Genelle Standing, MD;  Location: [facility];  Service: Orthopedics;  Laterality: Right;   REVISION TOTAL SHOULDER TO REVERSE TOTAL SHOULDER Right 04/25/2024   Procedure: REVISION, REVERSE TOTAL ARTHROPLASTY, SHOULDER;  Surgeon: Cristy Bonner DASEN, MD;  Location: [facility];  Service: Orthopedics;  Laterality: Right;   SYNOVIAL BIOPSY Right  04/25/2024   Procedure: BIOPSY, SYNOVIUM;  Surgeon: Cristy Bonner DASEN, MD;  Location: [facility];  Service: Orthopedics;  Laterality: Right;   TONSILLECTOMY     as a child   TOTAL LAPAROSCOPIC HYSTERECTOMY WITH SALPINGECTOMY Bilateral 04/14/2021   Procedure: TOTAL LAPAROSCOPIC HYSTERECTOMY WITH BILATERAL SALPINGECTOMY AND BILATERAL OOPHERECTOMY;  Surgeon: Bettina Muskrat, MD;  Location: [facility];  Service: Gynecology;  Laterality: Bilateral;   Patient Active Problem List   Diagnosis Date Noted   Status post reverse total arthroplasty of right shoulder 04/25/2024   Pre-operative clearance 04/05/2024   Moderate episode of recurrent major depressive disorder (HCC) 03/26/2024   Insomnia 03/26/2024   Lymphadenopathy, submandibular 06/20/2023   Painful tongue 12/23/2022   Seronegative rheumatoid arthritis (HCC) 12/20/2022   Metabolic dysfunction-associated steatotic liver disease (MASLD) 12/20/2022   Hyperlipidemia 12/20/2022   ADHD 12/20/2022   Chronic right shoulder pain 11/02/2022   Elevated liver enzymes 11/02/2022   Prediabetes 11/02/2022   Essential hypertension, benign 11/02/2022   Breast cancer screening by mammogram 11/02/2022   Joint inflammation 06/10/2022   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 05/05/2022   History of reverse total replacement of right shoulder joint 04/01/2022   Unilateral primary osteoarthritis, left knee 01/27/2022   Traumatic complete tear of right rotator cuff    S/P laparoscopic hysterectomy 04/14/2021   Grief reaction 03/23/2019   Gastroesophageal reflux disease without esophagitis 03/23/2019   Constipation 03/23/2019   Eczema 03/23/2019  Pain in joint, multiple sites 07/14/2018   Family history of arthritis 07/14/2018   Peeling skin 07/14/2018   Class 3 severe obesity due to excess calories with body mass index (BMI) of 40.0 to 44.9 in adult [facility]) 07/14/2018   Anxiety 07/14/2018   Depression 07/14/2018    PCP: Glennon Sand, NP    REFERRING PROVIDER: Jennye Aleck SAILOR, PA-C   REFERRING DIAG: [phone] (ICD-10-CM) - Status post reverse total shoulder replacement, right   THERAPY DIAG:  Right shoulder pain, unspecified chronicity  Acute pain of right shoulder  Traumatic complete tear of right rotator cuff, initial encounter  Muscle weakness (generalized)  Stiffness of right shoulder, not elsewhere classified  Rationale for Evaluation and Treatment: Rehabilitation  ONSET DATE: 04/25/24  SUBJECTIVE:                                                                                                                                                                                      SUBJECTIVE STATEMENT:  Pt reports pain at top of shoulder capsule. Pt notes that it feels more stiffness than pain. Pt rates pain at 5/10 currently.   Hand dominance: Right  PERTINENT HISTORY: [name] is a 54 year old female who presents for PT evaluation. Pt has had 4 failed shoulder arthroplasty surgeries. She has undergone multiple shoulder surgeries, with this being her fifth procedure. The patient reports persistent pain and instability in the shoulder. She has a history of rheumatoid arthritis and has been off her RA medications for over two months in preparation for surgery.   PAIN:  Are you having pain? Yes: NPRS scale: 4/10 Pain location: R shoulder Pain description: Tightness  Aggravating factors: movement Relieving factors: rest  PRECAUTIONS: Other: No lifting >5 pounds; Cannot put arm behind back  RED FLAGS: 5th shoulder surgery; 4 failed total shoulder replacements   WEIGHT BEARING RESTRICTIONS: No  FALLS:  Has patient fallen in last 6 months? No  LIVING ENVIRONMENT: Lives with: lives with their family and lives with their daughter Lives in: House/apartment Stairs: No Has following equipment at home: None  OCCUPATION: Unemployed; applied for disability  PLOF: Needs assistance with  ADLs  PATIENT GOALS:To have no help, brush teeth wash hair independently  NEXT MD VISIT: 2 months  OBJECTIVE:  Note: Objective measures were completed at Evaluation unless otherwise noted.  DIAGNOSTIC FINDINGS:  EXAM:  RIGHT SHOULDER - 1 VIEW   COMPARISON:  09/13/2022   FINDINGS:  Reverse right shoulder arthroplasty in expected alignment. No  periprosthetic lucency or fracture. Recent postsurgical change  includes air and edema in the joint space and soft tissues.   IMPRESSION:  Reverse right shoulder arthroplasty  without immediate postoperative  complication.   PATIENT SURVEYS:  Quick Dash:  QUICK DASH  Please rate your ability do the following activities in the last week by selecting the number below the appropriate response.   Activities Rating  Open a tight or new jar.  5 = Unable  Do heavy household chores (e.g., wash walls, floors). 5 = Unable  Carry a shopping bag or briefcase 3 = Moderate difficulty  Wash your back. 5 = Unable  Use a knife to cut food. 2 = Mild difficulty  Recreational activities in which you take some force or impact through your arm, shoulder or hand (e.g., golf, hammering, tennis, etc.). 5 = Unable  During the past week, to what extent has your arm, shoulder or hand problem interfered with your normal social activities with family, friends, neighbors or groups?  3 = Moderately  During the past week, were you limited in your work or other regular daily activities as a result of your arm, shoulder or hand problem? 4 = Very limited  Rate the severity of the following symptoms in the last week: Arm, Shoulder, or hand pain. 3 = Moderate  Rate the severity of the following symptoms in the last week: Tingling (pins and needles) in your arm, shoulder or hand. 2 = Mild  During the past week, how much difficulty have you had sleeping because of the pain in your arm, shoulder or hand?  3 = Moderate difficulty   (A QuickDASH score may not be calculated if  there is greater than 1 missing item.)  Quick Dash Disability/Symptom Score: [(sum of 40 (n) responses/11 (n)] x 25 = 65.91%  Minimally Clinically Important Difference (MCID): 15-20 points  (Franchignoni, F. et al. (2013). Minimally clinically important difference of the disabilities of the arm, shoulder, and hand outcome measures (DASH) and its shortened version (Quick DASH). Journal of Orthopaedic & Sports Physical Therapy, 44(1), 30-39)   COGNITION: Overall cognitive status: Within functional limits for tasks assessed     SENSATION: WFL   UPPER EXTREMITY ROM:   Active ROM of L Passive ROM of R Right eval Left eval  Shoulder flexion 90 180  Shoulder extension    Shoulder abduction 62 180  Shoulder adduction    Shoulder internal rotation NT 90  Shoulder external rotation 12 83  Elbow flexion    Elbow extension    Wrist flexion    Wrist extension    Wrist ulnar deviation    Wrist radial deviation    Wrist pronation    Wrist supination    (Blank rows = not tested)  UPPER EXTREMITY MMT:  MMT Right eval Left eval  Shoulder flexion    Shoulder extension    Shoulder abduction    Shoulder adduction    Shoulder internal rotation    Shoulder external rotation    Middle trapezius    Lower trapezius    Elbow flexion    Elbow extension    Wrist flexion    Wrist extension    Wrist ulnar deviation    Wrist radial deviation    Wrist pronation    Wrist supination    Grip strength (lbs) 33 40  (Blank rows = not tested)    PALPATION:  Pt demonstrates tenderness to palpate along incision, within L biceps and L brachialis muscles. Cervical musculature does not demonstrate any tenderness or pain during palpation.  TREATMENT DATE: 06/07/2024  Manual  Scar massage to R shoulder   STM Supine with TP release to the biceps and UT region for pain  management and increased tissue extensibility  Supine STM to long head and short head of biceps muscle near origin to increase extensibility of the muscle  Supine medial biceps release technique to decrease pain   Supine STM with TP release technique applied to brachialis for pain relief and increased tissue extensibility   PROM of R shoulder ( Flexion, abduction, IR/ ER)  TherEx  AAROM Supine chest press with PVC pipe, 2x10 AAROM Supine shoulder ER with PVC pipe, 2x10, 5 sec hold at end range AAROM Supine shoulder flexion with PVC pipe, 2x10, 5 sec hold at end range   Standing shoulder isometric extension into physioball, 3 sec holds, 2x10 Standing shoulder isometric flexion into physioball, 3 sec holds, 2x10 Standing shoulder isometric adduction into physioball, 3 sec holds, 2x10 Standing shoulder isometric abduction into physioball, 3 sec holds, 2x10  PATIENT EDUCATION: Education details: Home exercise program Person educated: Patient Education method: Explanation, Demonstration, and Handouts Education comprehension: verbalized understanding and needs further education  HOME EXERCISE PROGRAM: Access Code: [email or web address] URL: [email or web address] Date: 05/30/2024 Prepared by: Sidra Simpers  Exercises - Seated Scapular Retraction  - 1 x daily - 7 x weekly - 3 sets - 10 reps - Seated Shoulder Shrugs  - 1 x daily - 7 x weekly - 3 sets - 10 reps - Seated Shoulder Shrug Circles AROM Backward  - 1 x daily - 7 x weekly - 3 sets - 10 reps - Seated Shoulder Flexion Towel Slide at Table Top  - 1 x daily - 7 x weekly - 3 sets - 10 reps  ASSESSMENT:  CLINICAL IMPRESSION:  Patient responded well to the manual therapy approach.  Patient noted a reduction in overall symptom response and felt that the pain was diminished and instead started to feel more like soreness rather than pain.  Patient did note some increased pain with AAROM flexion using PVC pipe.  Will continue to  monitor this symptomatic response at future sessions and may be performed at a lighter weight.  Pt will continue to benefit from skilled therapy to address remaining deficits in order to improve overall QoL and return to PLOF.     OBJECTIVE IMPAIRMENTS: decreased coordination, decreased mobility, decreased ROM, decreased strength, and increased fascial restrictions.   ACTIVITY LIMITATIONS: carrying, lifting, sleeping, bathing, dressing, and reach over head  PARTICIPATION LIMITATIONS: meal prep, cleaning, laundry, and yard work  PERSONAL FACTORS: Past/current experiences are also affecting patient's functional outcome.   REHAB POTENTIAL: Fair due to 4 previous R shoulder surgeries  CLINICAL DECISION MAKING: Evolving/moderate complexity  EVALUATION COMPLEXITY: Moderate   GOALS: Goals reviewed with patient? Yes  SHORT TERM GOALS: Target date: 06/13/24  Pt will be independent with HEP in order to demonstrate increased ability to perform tasks related to occupation/hobbies. Baseline: NA Goal status: INITIAL  LONG TERM GOALS: Target date: 06/30/24   Pt will improve the Quick DASH Questionnaire to be >85% to demonstrate increased functional ability to perform tasks of daily living. Baseline: 65.91% Goal status: INITIAL  2.  Pt will reduce overall pain level to 2/10 by utilizing a combination of stretching, strengthening exercises, and pain-reducing modalities in order to improve overall QoL. Baseline: 4/10 Goal status: INITIAL  3.  Pt will improve R shoulder flexion and abduction to > 100 deg in order to increase functional mobility to complete patient's goal  of washing hair. Baseline: 90 deg/ 62 deg Goal status: INITIAL  PLAN:  PT FREQUENCY: 2x/week  PT DURATION: 12 weeks  PLANNED INTERVENTIONS: 97750- Physical Performance Testing, 97110-Therapeutic exercises, 97530- Therapeutic activity, 97112- Neuromuscular re-education, 97535- Self Care, [id number]- Manual therapy, 20560 (1-2  muscles), 20561 (3+ muscles)- Dry Needling, Patient/Family education, Joint mobilization, Cryotherapy, and Moist heat  PLAN FOR NEXT SESSION:   HEP review, STM to cervical region & R biceps, isometrics exercises    Laymon GORMAN Perfect, PT, DT Physical Therapist - [place]  [facility]   06/07/24, 10:55 AM  "

## 2024-06-12 ENCOUNTER — Ambulatory Visit

## 2024-06-18 ENCOUNTER — Ambulatory Visit

## 2024-06-20 ENCOUNTER — Ambulatory Visit

## 2024-06-25 ENCOUNTER — Ambulatory Visit

## 2024-06-28 ENCOUNTER — Ambulatory Visit

## 2024-07-02 ENCOUNTER — Ambulatory Visit

## 2024-07-05 ENCOUNTER — Ambulatory Visit

## 2024-07-10 ENCOUNTER — Ambulatory Visit

## 2024-07-12 ENCOUNTER — Ambulatory Visit

## 2024-07-16 ENCOUNTER — Ambulatory Visit

## 2024-07-19 ENCOUNTER — Ambulatory Visit

## 2024-07-23 ENCOUNTER — Ambulatory Visit

## 2024-07-25 ENCOUNTER — Ambulatory Visit

## 2024-07-30 ENCOUNTER — Ambulatory Visit: Admitting: Urology
